# Patient Record
Sex: Male | Born: 1945
Health system: Southern US, Community
[De-identification: ages and names within clinical notes are randomized; demographics above are authoritative.]

## PROBLEM LIST (undated history)

## (undated) ENCOUNTER — Emergency Department (HOSPITAL_COMMUNITY): Discharge status: Absent without leave | Payer: Medicare PPO

## (undated) DIAGNOSIS — I10 Essential (primary) hypertension: Secondary | ICD-10-CM

## (undated) DIAGNOSIS — J449 Chronic obstructive pulmonary disease, unspecified: Secondary | ICD-10-CM

## (undated) DIAGNOSIS — E785 Hyperlipidemia, unspecified: Secondary | ICD-10-CM

## (undated) DIAGNOSIS — E559 Vitamin D deficiency, unspecified: Secondary | ICD-10-CM

## (undated) DIAGNOSIS — E119 Type 2 diabetes mellitus without complications: Secondary | ICD-10-CM

## (undated) HISTORY — DX: Hyperlipidemia, unspecified: E78.5

## (undated) HISTORY — DX: Essential (primary) hypertension: I10

## (undated) HISTORY — DX: Chronic obstructive pulmonary disease, unspecified: J44.9

## (undated) HISTORY — DX: Vitamin D deficiency, unspecified: E55.9

## (undated) HISTORY — DX: Type 2 diabetes mellitus without complications: E11.9

---

## 1997-09-10 ENCOUNTER — Ambulatory Visit (HOSPITAL_COMMUNITY): Admission: RE | Admit: 1997-09-10 | Discharge: 1997-09-10 | Payer: Self-pay | Admitting: Internal Medicine

## 1998-08-29 ENCOUNTER — Encounter: Payer: Self-pay | Admitting: Internal Medicine

## 1998-08-29 ENCOUNTER — Ambulatory Visit (HOSPITAL_COMMUNITY): Admission: RE | Admit: 1998-08-29 | Discharge: 1998-08-29 | Payer: Self-pay | Admitting: Internal Medicine

## 1999-09-05 ENCOUNTER — Ambulatory Visit (HOSPITAL_COMMUNITY): Admission: RE | Admit: 1999-09-05 | Discharge: 1999-09-05 | Payer: Self-pay | Admitting: Internal Medicine

## 1999-09-05 ENCOUNTER — Encounter: Payer: Self-pay | Admitting: Internal Medicine

## 2001-03-04 ENCOUNTER — Encounter: Payer: Self-pay | Admitting: Internal Medicine

## 2001-03-04 ENCOUNTER — Ambulatory Visit (HOSPITAL_COMMUNITY): Admission: RE | Admit: 2001-03-04 | Discharge: 2001-03-04 | Payer: Self-pay | Admitting: Internal Medicine

## 2002-03-31 ENCOUNTER — Ambulatory Visit (HOSPITAL_COMMUNITY): Admission: RE | Admit: 2002-03-31 | Discharge: 2002-03-31 | Payer: Self-pay | Admitting: Internal Medicine

## 2002-03-31 ENCOUNTER — Encounter: Payer: Self-pay | Admitting: Internal Medicine

## 2003-08-20 ENCOUNTER — Ambulatory Visit (HOSPITAL_COMMUNITY): Admission: RE | Admit: 2003-08-20 | Discharge: 2003-08-20 | Payer: Self-pay | Admitting: Internal Medicine

## 2005-05-07 ENCOUNTER — Ambulatory Visit (HOSPITAL_COMMUNITY): Admission: RE | Admit: 2005-05-07 | Discharge: 2005-05-07 | Payer: Self-pay | Admitting: Internal Medicine

## 2006-04-12 ENCOUNTER — Emergency Department (HOSPITAL_COMMUNITY): Admission: EM | Admit: 2006-04-12 | Discharge: 2006-04-12 | Payer: Self-pay | Admitting: Emergency Medicine

## 2007-07-03 ENCOUNTER — Ambulatory Visit (HOSPITAL_COMMUNITY): Admission: RE | Admit: 2007-07-03 | Discharge: 2007-07-03 | Payer: Self-pay | Admitting: Internal Medicine

## 2007-08-01 ENCOUNTER — Ambulatory Visit: Payer: Self-pay | Admitting: Gastroenterology

## 2007-08-14 ENCOUNTER — Ambulatory Visit: Payer: Self-pay | Admitting: Gastroenterology

## 2007-08-14 ENCOUNTER — Encounter: Payer: Self-pay | Admitting: Gastroenterology

## 2007-08-15 LAB — HM COLONOSCOPY

## 2007-08-19 ENCOUNTER — Encounter: Payer: Self-pay | Admitting: Gastroenterology

## 2009-12-12 ENCOUNTER — Ambulatory Visit (HOSPITAL_COMMUNITY): Admission: RE | Admit: 2009-12-12 | Discharge: 2009-12-12 | Payer: Self-pay | Admitting: Internal Medicine

## 2011-02-05 ENCOUNTER — Ambulatory Visit (HOSPITAL_COMMUNITY)
Admission: RE | Admit: 2011-02-05 | Discharge: 2011-02-05 | Disposition: A | Discharge status: Home / Self Care | Payer: BC Managed Care – PPO | Source: Ambulatory Visit | Attending: Internal Medicine | Admitting: Internal Medicine

## 2011-02-05 ENCOUNTER — Other Ambulatory Visit (HOSPITAL_COMMUNITY): Payer: Self-pay | Admitting: Internal Medicine

## 2011-02-05 DIAGNOSIS — I10 Essential (primary) hypertension: Secondary | ICD-10-CM

## 2011-02-05 DIAGNOSIS — Z0000 Encounter for general adult medical examination without abnormal findings: Secondary | ICD-10-CM | POA: Insufficient documentation

## 2011-09-12 ENCOUNTER — Other Ambulatory Visit (HOSPITAL_COMMUNITY): Payer: Self-pay | Admitting: Physician Assistant

## 2011-09-12 ENCOUNTER — Ambulatory Visit (HOSPITAL_COMMUNITY)
Admission: RE | Admit: 2011-09-12 | Discharge: 2011-09-12 | Disposition: A | Discharge status: Home / Self Care | Payer: Medicare Other | Source: Ambulatory Visit | Attending: Physician Assistant | Admitting: Physician Assistant

## 2011-09-12 DIAGNOSIS — S92919A Unspecified fracture of unspecified toe(s), initial encounter for closed fracture: Secondary | ICD-10-CM | POA: Insufficient documentation

## 2011-09-12 DIAGNOSIS — S91309A Unspecified open wound, unspecified foot, initial encounter: Secondary | ICD-10-CM

## 2011-09-12 DIAGNOSIS — W208XXA Other cause of strike by thrown, projected or falling object, initial encounter: Secondary | ICD-10-CM | POA: Insufficient documentation

## 2012-02-11 ENCOUNTER — Ambulatory Visit (HOSPITAL_COMMUNITY)
Admission: RE | Admit: 2012-02-11 | Discharge: 2012-02-11 | Disposition: A | Discharge status: Home / Self Care | Payer: Medicare Other | Source: Ambulatory Visit | Attending: Internal Medicine | Admitting: Internal Medicine

## 2012-02-11 ENCOUNTER — Other Ambulatory Visit (HOSPITAL_COMMUNITY): Payer: Self-pay | Admitting: Internal Medicine

## 2012-02-11 DIAGNOSIS — I10 Essential (primary) hypertension: Secondary | ICD-10-CM

## 2012-02-11 DIAGNOSIS — J984 Other disorders of lung: Secondary | ICD-10-CM | POA: Insufficient documentation

## 2012-05-05 ENCOUNTER — Ambulatory Visit: Payer: 59 | Admitting: Gastroenterology

## 2012-05-12 ENCOUNTER — Encounter: Payer: Self-pay | Admitting: Gastroenterology

## 2012-05-12 ENCOUNTER — Ambulatory Visit (INDEPENDENT_AMBULATORY_CARE_PROVIDER_SITE_OTHER): Payer: Medicare PPO | Admitting: Gastroenterology

## 2012-05-12 VITALS — BP 112/68 | HR 72 | Ht 68.75 in | Wt 152.5 lb

## 2012-05-12 DIAGNOSIS — R195 Other fecal abnormalities: Secondary | ICD-10-CM

## 2012-05-12 DIAGNOSIS — R7309 Other abnormal glucose: Secondary | ICD-10-CM | POA: Insufficient documentation

## 2012-05-12 DIAGNOSIS — E119 Type 2 diabetes mellitus without complications: Secondary | ICD-10-CM

## 2012-05-12 NOTE — Progress Notes (Signed)
History of Present Illness: This is a 67 year old white male referred at the request of [name], MD For evaluation of Hemoccult-positive stools. This was noted on routine testing. He has no GI complaints including change of bowel habits, abdominal pain, melena or hematochezia. He's on no gastric irritants including nonsteroidals. 2009 colonoscopy demonstrated a small hyperplastic polyp.     Past Medical History  Diagnosis Date  . DM (diabetes mellitus)   . HTN (hypertension)   . HLD (hyperlipidemia)    History reviewed. No pertinent past surgical history. family history includes Diabetes in his brother and sister and Heart attack in his brother and mother. Current Outpatient Prescriptions  Medication Sig Dispense Refill  . enalapril (VASOTEC) 10 MG tablet Take 10 mg by mouth daily.      . metFORMIN (GLUCOPHAGE) 500 MG tablet Take 500 mg by mouth 2 (two) times daily with a meal.       No current facility-administered medications for this visit.   Allergies as of 05/12/2012  . (No Known Allergies)    reports that he has been smoking Cigarettes.  He has a .3 pack-year smoking history. He has never used smokeless tobacco. He reports that  drinks alcohol. He reports that he does not use illicit drugs.     Review of Systems: Pertinent positive and negative review of systems were noted in the above HPI section. All other review of systems were otherwise negative.  Vital signs were reviewed in today's medical record Physical Exam: General: Well developed , well nourished, no acute distress Skin: anicteric Head: Normocephalic and atraumatic Eyes:  sclerae anicteric, EOMI Ears: Normal auditory acuity Mouth: No deformity or lesions Neck: Supple, no masses or thyromegaly Lungs: Clear throughout to auscultation Heart: Regular rate and rhythm; no murmurs, rubs or bruits Abdomen: Soft, non tender and non distended. No masses, hepatosplenomegaly or hernias noted. Normal Bowel  sounds Rectal:deferred Musculoskeletal: Symmetrical with no gross deformities  Skin: No lesions on visible extremities Pulses:  Normal pulses noted Extremities: No clubbing, cyanosis, edema or deformities noted Neurological: Alert oriented x 4, grossly nonfocal Cervical Nodes:  No significant cervical adenopathy Inguinal Nodes: No significant inguinal adenopathy Psychological:  Alert and cooperative. Normal mood and affect

## 2012-05-12 NOTE — Patient Instructions (Addendum)
You have been given a separate informational sheet regarding your tobacco use, the importance of quitting and local resources to help you quit. It has been recommended to you by Dr Arlyce Dice that you schedule a Colonoscopy Please call bask when you are ready to schedule

## 2012-05-12 NOTE — Assessment & Plan Note (Addendum)
Plan repeat colonoscopy. If negative I will obtain followup. Check recent CBC

## 2012-12-15 ENCOUNTER — Other Ambulatory Visit (HOSPITAL_COMMUNITY): Payer: Self-pay | Admitting: Physician Assistant

## 2012-12-15 ENCOUNTER — Ambulatory Visit (HOSPITAL_COMMUNITY)
Admission: RE | Admit: 2012-12-15 | Discharge: 2012-12-15 | Disposition: A | Discharge status: Home / Self Care | Payer: Medicare PPO | Source: Ambulatory Visit | Attending: Physician Assistant | Admitting: Physician Assistant

## 2012-12-15 DIAGNOSIS — M79609 Pain in unspecified limb: Secondary | ICD-10-CM | POA: Insufficient documentation

## 2012-12-15 DIAGNOSIS — M25579 Pain in unspecified ankle and joints of unspecified foot: Secondary | ICD-10-CM | POA: Insufficient documentation

## 2012-12-15 DIAGNOSIS — M7989 Other specified soft tissue disorders: Secondary | ICD-10-CM | POA: Insufficient documentation

## 2012-12-15 DIAGNOSIS — S93409A Sprain of unspecified ligament of unspecified ankle, initial encounter: Secondary | ICD-10-CM

## 2013-02-11 ENCOUNTER — Encounter: Payer: Self-pay | Admitting: Internal Medicine

## 2013-02-11 DIAGNOSIS — E782 Mixed hyperlipidemia: Secondary | ICD-10-CM | POA: Insufficient documentation

## 2013-02-11 DIAGNOSIS — I10 Essential (primary) hypertension: Secondary | ICD-10-CM | POA: Insufficient documentation

## 2013-02-11 DIAGNOSIS — E559 Vitamin D deficiency, unspecified: Secondary | ICD-10-CM | POA: Insufficient documentation

## 2013-02-11 NOTE — Progress Notes (Signed)
Patient ID: Jesus Bush, male   DOB: [birth year], 67 y.o.   MRN: [id number]   This very nice 67 yo MWM presents for comprehensive annual screening examination follow up with hypertension, hyperlipidemia, diabetes and vitamin D deficiency.    BP has been controlled at home. Today's BP is  116/70. Patient denies any cardiac type chest pain, palpitations, dyspnea/orthopnea/PND, dizziness, claudication, or dependent edema.   Hyperlipidemia is controlled with diet & meds. Last cholesterol was 161, Triglycerides were  277, HDL 31, and LDL 75 at goal . Patient denies myalgias or other med SE's.    Also, the patient's diabetes has been controlled with diet and medications. Last A1c was 5.8%.  FBG's usually range betw. 100-120 mg%.Patient denies any symptoms of reactive hypoglycemia, diabetic polys, paresthesias or visual blurring.   Further, Patient has history of vitamin D deficiency with last vitamin D of 45 . Patient supplements vitamin without any suspected side-effects.  Current Outpatient Prescriptions on File Prior to Visit  Medication Sig Dispense Refill  . aspirin 81 MG tablet Take 81 mg by mouth daily.      Marland Kitchen atorvastatin (LIPITOR) 40 MG tablet Take 40 mg by mouth daily.      . enalapril (VASOTEC) 10 MG tablet Take 10 mg by mouth daily.      . magnesium gluconate (MAGONATE) 500 MG tablet Take 500 mg by mouth 2 (two) times daily.      . metFORMIN (GLUMETZA) 500 MG (MOD) 24 hr tablet Take 500 mg by mouth daily with breakfast.      . Vitamin D, Ergocalciferol, (DRISDOL) 50000 UNITS CAPS capsule Take 50,000 Units by mouth daily.         Allergies  Allergen Reactions  . Ppd [Tuberculin Purified Protein Derivative]     Positive PPD.    PMHx:   Past Medical History  Diagnosis Date  . Vitamin D deficiency   . Diabetes mellitus without complication   . Hypertension   . Hyperlipidemia     FHx:    Reviewed / unchanged  SHx:    Reviewed / unchanged  Systems Review: Constitutional:  Denies fever, chills, wt changes, headaches, insomnia, fatigue, night sweats, change in appetite. Eyes: Denies redness, blurred vision, diplopia, discharge, itchy, watery eyes.  ENT: Denies discharge, congestion, post nasal drip, epistaxis, sore throat, earache, hearing loss, dental pain, tinnitus, vertigo, sinus pain, snoring.  CV: Denies chest pain, palpitations, irregular heartbeat, syncope, dyspnea, diaphoresis, orthopnea, PND, claudication, edema. Respiratory: denies cough, dyspnea, DOE, pleurisy, hoarseness, laryngitis, wheezing.  Gastrointestinal: Denies dysphagia, odynophagia, heartburn, reflux, water brash, abdominal pain or cramps, nausea, vomiting, bloating, diarrhea, constipation, hematemesis, melena, hematochezia,  Hemorrhoids. Genitourinary: Denies dysuria, frequency, urgency, nocturia, hesitancy, discharge, hematuria, flank pain. Musculoskeletal: Denies arthralgias, myalgias, stiffness, jt. swelling, pain, limp, strain/sprain.  Skin: Denies pruritus, rash, hives, warts, acne, eczema, change in skin lesion(s). Neuro: No weakness, tremor, incoordination, spasms, paresthesia, or pain. Psychiatric: Denies confusion, memory loss, or sensory loss. Endo: Denies change in weight, skin, hair change.  Heme/Lymph: No excessive bleeding, bruising, orenlarged lymph nodes.  BP 116/70   P 80   R 16   Ht 5'10"   Wt 149 #  Estimated body mass index is 22.69 kg/(m^2) as calculated from the following:   Height as of 05/12/12: 5' 8.75" (1.746 m).   Weight as of 05/12/12: 152 lb 8 oz (69.174 kg).  On Exam: Appears well nourished - in no distress. Eyes: PERRLA, EOMs, conjunctiva no swelling or erythema. Sinuses: No frontal/maxillary  tenderness ENT/Mouth: EAC's clear, TM's nl w/o erythema, bulging. Nares clear w/o erythema, swelling, exudates. Oropharynx clear without erythema or exudates. Oral hygiene is good. Tongue normal, non obstructing. Hearing intact.  Neck: Supple. Thyroid nl. Car 2+/2+  without bruits, nodes or JVD. Chest: Respirations nl with BS clear & equal w/o rales, rhonchi, wheezing or stridor.  Cor: Heart sounds normal w/ regular rate and rhythm without sig. murmurs, gallops, clicks, or rubs. Peripheral pulses normal and equal  without edema.  Abdomen: Soft & bowel sounds normal. Non-tender w/o guarding, rebound, hernias, masses, or organomegaly.  Lymphatics: Unremarkable.  Musculoskeletal: Full ROM all peripheral extremities, joint stability, 5/5 strength, and normal gait.  Skin: Warm, dry without exposed rashes, lesions, ecchymosis apparent.  Neuro: Cranial nerves intact, reflexes equal bilaterally. Sensory-motor testing grossly intact. Tendon reflexes grossly intact.  Pysch: Alert & oriented x 3. Insight and judgement nl & appropriate. No ideations.  Assessment and Plan:  1. Hypertension - Continue monitor blood pressure at home. Continue diet/meds same.  2. Hyperlipidemia - Continue diet/meds, exercise,& lifestyle modifications. Continue monitor periodic cholesterol/liver & renal functions   3. Diabetes - continue recommend prudent low glycemic diet, weight control, regular exercise, diabetic monitoring and periodic eye exams- referred to Dr Emily Filbert  4. Vitamin D Deficiency - Continue supplementation.  Further disposition pending results of labs.

## 2013-02-12 ENCOUNTER — Ambulatory Visit: Payer: Commercial Managed Care - HMO | Admitting: Internal Medicine

## 2013-02-12 ENCOUNTER — Encounter: Payer: Self-pay | Admitting: Internal Medicine

## 2013-02-12 VITALS — BP 116/70 | HR 80 | Temp 97.9°F | Resp 16 | Ht 70.0 in | Wt 149.6 lb

## 2013-02-12 DIAGNOSIS — I10 Essential (primary) hypertension: Secondary | ICD-10-CM

## 2013-02-12 DIAGNOSIS — E559 Vitamin D deficiency, unspecified: Secondary | ICD-10-CM

## 2013-02-12 DIAGNOSIS — Z0000 Encounter for general adult medical examination without abnormal findings: Secondary | ICD-10-CM

## 2013-02-12 DIAGNOSIS — E782 Mixed hyperlipidemia: Secondary | ICD-10-CM

## 2013-02-12 DIAGNOSIS — E119 Type 2 diabetes mellitus without complications: Secondary | ICD-10-CM

## 2013-02-12 DIAGNOSIS — Z1212 Encounter for screening for malignant neoplasm of rectum: Secondary | ICD-10-CM

## 2013-02-12 DIAGNOSIS — Z79899 Other long term (current) drug therapy: Secondary | ICD-10-CM

## 2013-02-12 LAB — HEMOGLOBIN A1C
Hgb A1c MFr Bld: 5.9 % — ABNORMAL HIGH (ref <5.7)
Mean Plasma Glucose: 123 mg/dL — ABNORMAL HIGH (ref <117)

## 2013-02-12 LAB — CBC WITH DIFFERENTIAL/PLATELET
Basophils Absolute: 0 10*3/uL (ref 0.0–0.1)
Basophils Relative: 1 % (ref 0–1)
Hemoglobin: 15.2 g/dL (ref 13.0–17.0)
Lymphocytes Relative: 27 % (ref 12–46)
Lymphs Abs: 2.2 10*3/uL (ref 0.7–4.0)
MCHC: 34.2 g/dL (ref 30.0–36.0)
MCV: 94.1 fL (ref 78.0–100.0)
Monocytes Absolute: 0.5 10*3/uL (ref 0.1–1.0)
Monocytes Relative: 7 % (ref 3–12)
Platelets: 287 10*3/uL (ref 150–400)
RDW: 13.2 % (ref 11.5–15.5)
WBC: 8 10*3/uL (ref 4.0–10.5)

## 2013-02-12 LAB — LIPID PANEL: HDL: 38 mg/dL — ABNORMAL LOW (ref >39)

## 2013-02-12 LAB — BASIC METABOLIC PANEL WITH GFR
Potassium: 4.2 mEq/L (ref 3.5–5.3)
Sodium: 139 mEq/L (ref 135–145)

## 2013-02-12 LAB — HEPATIC FUNCTION PANEL
Alkaline Phosphatase: 90 U/L (ref 39–117)
Total Protein: 7.2 g/dL (ref 6.0–8.3)

## 2013-02-13 ENCOUNTER — Ambulatory Visit (HOSPITAL_COMMUNITY)
Admission: RE | Admit: 2013-02-13 | Discharge: 2013-02-13 | Disposition: A | Discharge status: Home / Self Care | Payer: Medicare PPO | Source: Ambulatory Visit | Attending: Internal Medicine | Admitting: Internal Medicine

## 2013-02-13 DIAGNOSIS — I10 Essential (primary) hypertension: Secondary | ICD-10-CM | POA: Insufficient documentation

## 2013-02-13 DIAGNOSIS — I700 Atherosclerosis of aorta: Secondary | ICD-10-CM | POA: Insufficient documentation

## 2013-02-13 DIAGNOSIS — J984 Other disorders of lung: Secondary | ICD-10-CM | POA: Insufficient documentation

## 2013-02-13 DIAGNOSIS — F17200 Nicotine dependence, unspecified, uncomplicated: Secondary | ICD-10-CM | POA: Insufficient documentation

## 2013-02-13 LAB — TSH: TSH: 0.367 u[IU]/mL (ref 0.350–4.500)

## 2013-02-13 LAB — INSULIN, FASTING: Insulin fasting, serum: 7 u[IU]/mL (ref 3–28)

## 2013-02-13 LAB — VITAMIN D 25 HYDROXY (VIT D DEFICIENCY, FRACTURES): Vit D, 25-Hydroxy: 74 ng/mL (ref 30–89)

## 2013-02-13 NOTE — Addendum Note (Signed)
Addended by: [name] on: 02/13/2013 02:34 PM   Modules accepted: Orders

## 2013-02-16 ENCOUNTER — Telehealth: Payer: Self-pay

## 2013-02-16 NOTE — Telephone Encounter (Signed)
Message copied by Joya Martyr on Mon Feb 16, 2013  8:55 AM ------      Message from: [name]      Created: Fri Feb 13, 2013  3:32 PM       Call CXR shows COPD no cancer yet and important to stop smoking ------

## 2013-04-17 ENCOUNTER — Ambulatory Visit (INDEPENDENT_AMBULATORY_CARE_PROVIDER_SITE_OTHER): Payer: Commercial Managed Care - HMO | Admitting: *Deleted

## 2013-04-17 DIAGNOSIS — Z23 Encounter for immunization: Secondary | ICD-10-CM

## 2013-07-24 ENCOUNTER — Other Ambulatory Visit: Payer: Self-pay | Admitting: Internal Medicine

## 2013-08-19 ENCOUNTER — Other Ambulatory Visit: Payer: Self-pay | Admitting: Internal Medicine

## 2013-08-30 ENCOUNTER — Emergency Department (HOSPITAL_COMMUNITY): Payer: Medicare PPO

## 2013-08-30 ENCOUNTER — Emergency Department (HOSPITAL_COMMUNITY)
Admission: EM | Admit: 2013-08-30 | Discharge: 2013-08-30 | Disposition: A | Discharge status: Home / Self Care | Payer: Medicare PPO | Attending: Emergency Medicine | Admitting: Emergency Medicine

## 2013-08-30 ENCOUNTER — Encounter (HOSPITAL_COMMUNITY): Payer: Self-pay | Admitting: Emergency Medicine

## 2013-08-30 DIAGNOSIS — E785 Hyperlipidemia, unspecified: Secondary | ICD-10-CM | POA: Insufficient documentation

## 2013-08-30 DIAGNOSIS — S81011A Laceration without foreign body, right knee, initial encounter: Secondary | ICD-10-CM

## 2013-08-30 DIAGNOSIS — E559 Vitamin D deficiency, unspecified: Secondary | ICD-10-CM | POA: Insufficient documentation

## 2013-08-30 DIAGNOSIS — S81809A Unspecified open wound, unspecified lower leg, initial encounter: Principal | ICD-10-CM

## 2013-08-30 DIAGNOSIS — Z79899 Other long term (current) drug therapy: Secondary | ICD-10-CM | POA: Insufficient documentation

## 2013-08-30 DIAGNOSIS — S91009A Unspecified open wound, unspecified ankle, initial encounter: Principal | ICD-10-CM

## 2013-08-30 DIAGNOSIS — E119 Type 2 diabetes mellitus without complications: Secondary | ICD-10-CM | POA: Insufficient documentation

## 2013-08-30 DIAGNOSIS — I10 Essential (primary) hypertension: Secondary | ICD-10-CM | POA: Insufficient documentation

## 2013-08-30 DIAGNOSIS — Y929 Unspecified place or not applicable: Secondary | ICD-10-CM | POA: Insufficient documentation

## 2013-08-30 DIAGNOSIS — Z7982 Long term (current) use of aspirin: Secondary | ICD-10-CM | POA: Insufficient documentation

## 2013-08-30 DIAGNOSIS — S81009A Unspecified open wound, unspecified knee, initial encounter: Secondary | ICD-10-CM | POA: Insufficient documentation

## 2013-08-30 DIAGNOSIS — W292XXA Contact with other powered household machinery, initial encounter: Secondary | ICD-10-CM | POA: Insufficient documentation

## 2013-08-30 DIAGNOSIS — Y9389 Activity, other specified: Secondary | ICD-10-CM | POA: Insufficient documentation

## 2013-08-30 DIAGNOSIS — F17200 Nicotine dependence, unspecified, uncomplicated: Secondary | ICD-10-CM | POA: Insufficient documentation

## 2013-08-30 MED ORDER — OXYCODONE-ACETAMINOPHEN 5-325 MG PO TABS
1.0000 | ORAL_TABLET | Freq: Once | ORAL | Status: AC
  Administered 2013-08-30: 1 via ORAL
  Filled 2013-08-30: qty 1

## 2013-08-30 MED ORDER — HYDROCODONE-ACETAMINOPHEN 5-325 MG PO TABS: 1.0000 | ORAL_TABLET | Freq: Four times a day (QID) | ORAL | Status: DC | PRN

## 2013-08-30 MED ORDER — CEPHALEXIN 500 MG PO CAPS: 500.0000 mg | ORAL_CAPSULE | Freq: Four times a day (QID) | ORAL | Status: DC

## 2013-08-30 NOTE — ED Notes (Signed)
Reports injuring left leg while using a grinder. Has 5cm laceration to anterior lower leg, bleeding controlled.

## 2013-08-30 NOTE — Discharge Instructions (Signed)
Keflex to prevent infection. norco for pain. Keep clean. Apply topical antibiotic ointment twice a day. Watch for signs if infection. If any issues return to ED, otherwise follow up in 10 days for suture removal.    Laceration Care, Adult A laceration is a cut or lesion that goes through all layers of the skin and into the tissue just beneath the skin. TREATMENT  Some lacerations may not require closure. Some lacerations may not be able to be closed due to an increased risk of infection. It is important to see your caregiver as soon as possible after an injury to minimize the risk of infection and maximize the opportunity for successful closure. If closure is appropriate, pain medicines may be given, if needed. The wound will be cleaned to help prevent infection. Your caregiver will use stitches (sutures), staples, wound glue (adhesive), or skin adhesive strips to repair the laceration. These tools bring the skin edges together to allow for faster healing and a better cosmetic outcome. However, all wounds will heal with a scar. Once the wound has healed, scarring can be minimized by covering the wound with sunscreen during the day for 1 full year. HOME CARE INSTRUCTIONS  For sutures or staples:  Keep the wound clean and dry.  If you were given a bandage (dressing), you should change it at least once a day. Also, change the dressing if it becomes wet or dirty, or as directed by your caregiver.  Wash the wound with soap and water 2 times a day. Rinse the wound off with water to remove all soap. Pat the wound dry with a clean towel.  After cleaning, apply a thin layer of the antibiotic ointment as recommended by your caregiver. This will help prevent infection and keep the dressing from sticking.  You may shower as usual after the first 24 hours. Do not soak the wound in water until the sutures are removed.  Only take over-the-counter or prescription medicines for pain, discomfort, or fever as  directed by your caregiver.  Get your sutures or staples removed as directed by your caregiver. For skin adhesive strips:  Keep the wound clean and dry.  Do not get the skin adhesive strips wet. You may bathe carefully, using caution to keep the wound dry.  If the wound gets wet, pat it dry with a clean towel.  Skin adhesive strips will fall off on their own. You may trim the strips as the wound heals. Do not remove skin adhesive strips that are still stuck to the wound. They will fall off in time. For wound adhesive:  You may briefly wet your wound in the shower or bath. Do not soak or scrub the wound. Do not swim. Avoid periods of heavy perspiration until the skin adhesive has fallen off on its own. After showering or bathing, gently pat the wound dry with a clean towel.  Do not apply liquid medicine, cream medicine, or ointment medicine to your wound while the skin adhesive is in place. This may loosen the film before your wound is healed.  If a dressing is placed over the wound, be careful not to apply tape directly over the skin adhesive. This may cause the adhesive to be pulled off before the wound is healed.  Avoid prolonged exposure to sunlight or tanning lamps while the skin adhesive is in place. Exposure to ultraviolet light in the first year will darken the scar.  The skin adhesive will usually remain in place for 5 to 10  days, then naturally fall off the skin. Do not pick at the adhesive film. You may need a tetanus shot if:  You cannot remember when you had your last tetanus shot.  You have never had a tetanus shot. If you get a tetanus shot, your arm may swell, get red, and feel warm to the touch. This is common and not a problem. If you need a tetanus shot and you choose not to have one, there is a rare chance of getting tetanus. Sickness from tetanus can be serious. SEEK MEDICAL CARE IF:   You have redness, swelling, or increasing pain in the wound.  You see a red  line that goes away from the wound.  You have yellowish-white fluid (pus) coming from the wound.  You have a fever.  You notice a bad smell coming from the wound or dressing.  Your wound breaks open before or after sutures have been removed.  You notice something coming out of the wound such as wood or glass.  Your wound is on your hand or foot and you cannot move a finger or toe. SEEK IMMEDIATE MEDICAL CARE IF:   Your pain is not controlled with prescribed medicine.  You have severe swelling around the wound causing pain and numbness or a change in color in your arm, hand, leg, or foot.  Your wound splits open and starts bleeding.  You have worsening numbness, weakness, or loss of function of any joint around or beyond the wound.  You develop painful lumps near the wound or on the skin anywhere on your body. MAKE SURE YOU:   Understand these instructions.  Will watch your condition.  Will get help right away if you are not doing well or get worse. Document Released: 03/12/2005 Document Revised: 06/04/2011 Document Reviewed: 09/05/2010 [facility] Patient Information 2014 [place], [place].

## 2013-08-30 NOTE — ED Notes (Signed)
PA at the bedside performing suture care. Pt tolerating without difficulty.

## 2013-08-30 NOTE — ED Provider Notes (Signed)
CSN: [id number]     Arrival date & time 08/30/13  1239 History   First MD Initiated Contact with Patient 08/30/13 1244     Chief Complaint  Patient presents with  . Laceration     (Consider location/radiation/quality/duration/timing/severity/associated sxs/prior Treatment) HPI Jesus Bush is a 68 y.o. male who presents to ED with laceration to the right knee. States was using a grinder and accidentally cut himself. Denies numbness or weakness to the leg. Denies difficulty moving the leg. Tetanus within 2 years. No other injuries.   Past Medical History  Diagnosis Date  . Vitamin D deficiency   . Diabetes mellitus without complication   . Hypertension   . Hyperlipidemia    History reviewed. No pertinent past surgical history. Family History  Problem Relation Age of Onset  . Heart attack Mother   . Heart disease Mother   . Heart attack Brother   . Hypertension Brother   . Diabetes Sister   . Heart disease Sister   . Heart disease Father   . Heart attack Father    History  Substance Use Topics  . Smoking status: Current Every Day Smoker -- 0.03 packs/day for 10 years    Types: Cigarettes  . Smokeless tobacco: Never Used  . Alcohol Use: Yes     Comment: liquor- once a week    Review of Systems  Constitutional: Negative for fever and chills.  Respiratory: Negative for cough, chest tightness and shortness of breath.   Cardiovascular: Negative for chest pain, palpitations and leg swelling.  Genitourinary: Negative for dysuria, urgency, frequency and hematuria.  Musculoskeletal: Negative for arthralgias and myalgias.  Skin: Positive for wound. Negative for rash.  Allergic/Immunologic: Negative for immunocompromised state.  Neurological: Negative for dizziness, weakness, light-headedness, numbness and headaches.      Allergies  Ppd  Home Medications   Prior to Admission medications   Medication Sig Start Date End Date Taking? Authorizing Provider  aspirin 81 MG  tablet Take 81 mg by mouth daily.    Historical Provider, MD  atorvastatin (LIPITOR) 40 MG tablet TAKE 1 TABLET EVERY DAY FOR CHOLESTEROL 07/24/13   Vicie Mutters, PA-C  enalapril (VASOTEC) 10 MG tablet Take 10 mg by mouth daily.    Historical Provider, MD  enalapril (VASOTEC) 20 MG tablet TAKE 1 TABLET EVERY DAY FOR BLOOD PRESSURE 08/19/13   Melissa R Smith, PA-C  magnesium gluconate (MAGONATE) 500 MG tablet Take 500 mg by mouth 2 (two) times daily.    Historical Provider, MD  metFORMIN (GLUMETZA) 500 MG (MOD) 24 hr tablet Take 500 mg by mouth daily with breakfast.    Historical Provider, MD  Vitamin D, Ergocalciferol, (DRISDOL) 50000 UNITS CAPS capsule Take 50,000 Units by mouth daily. Takes every other day    Historical Provider, MD   BP 119/60  Pulse 84  Temp(Src) 98 F (36.7 C) (Oral)  Resp 18  Ht 5\' 10"  (1.778 m)  Wt 155 lb (70.308 kg)  BMI 22.24 kg/m2  SpO2 99% Physical Exam  Nursing note and vitals reviewed. Constitutional: He appears well-developed and well-nourished. No distress.  HENT:  Head: Normocephalic and atraumatic.  Eyes: Conjunctivae are normal.  Neck: Neck supple.  Cardiovascular: Normal rate, regular rhythm and normal heart sounds.   Pulmonary/Chest: Effort normal. No respiratory distress. He has no wheezes. He has no rales.  Musculoskeletal: He exhibits no edema.  Full ROM of the knee and ankle. 5/5 strength with knee extension and flexion against resistance. 5/5 and equal strength  with ankle dorsi and plantarflexion. Dorsal pedal pulses intact. Sensation intact over anterior and posterior shin and foot.   Neurological: He is alert.  Skin: Skin is warm and dry.  6cm laceration to the right anterior shin just distal to the knee. Gaping. Hemostatic.     ED Course  Procedures (including critical care time) Labs Review Labs Reviewed - No data to display  Imaging Review No results found.   EKG Interpretation None      LACERATION REPAIR Performed by:  Gentri Guardado A Kinnie Kaupp Authorized by: Florene Route Brecklyn Galvis Consent: Verbal consent obtained. Risks and benefits: risks, benefits and alternatives were discussed Consent given by: patient Patient identity confirmed: provided demographic data Prepped and Draped in normal sterile fashion Wound explored  Laceration Location: right shin  Laceration Length: 6cm  No Foreign Bodies seen or palpated  Anesthesia: local infiltration  Local anesthetic: lidocaine 2% w epinephrine  Anesthetic total: 3 ml  Irrigation method: syringe Amount of cleaning: standard  Skin closure: subcutaneous with vicril rapid 4.0, skin prolene 4.0  Number of sutures: 2 subq, 8 skin  Technique: simple interrupted  Patient tolerance: Patient tolerated the procedure well with no immediate complications.  MDM   Final diagnoses:  Laceration of right knee   Deep laceration to the right proximal shin, right under the knee. X-ray obtained to rule out foreign body and bony involvement and is negative. Wound does appear somewhat 30, irrigated with 1 L of saline. There is no evidence of any nerve, vascular, tendon involvement. Patient has full range of motion of both knee and ankle joints with no strength deficit. Wound repaired. Patient is diabetic, and a dirty wound, will place on Keflex. Home with Norco for pain and followup as needed or in 10 days for suture removal  Filed Vitals:   08/30/13 1247  BP: 119/60  Pulse: 84  Temp: 98 F (36.7 C)  TempSrc: Oral  Resp: 18  Height: 5\' 10"  (1.778 m)  Weight: 155 lb (70.308 kg)  SpO2: 99%     [name] A [name], PA-C 08/30/13 1554

## 2013-08-30 NOTE — ED Notes (Signed)
Dressing and antibiotic ointment applied to R lower leg laceration. Wound care instructions reviewed with patient. Has no further questions at the time. Vital signs stable.

## 2013-08-31 NOTE — ED Provider Notes (Signed)
68 y.o. Male with injury from grinder to right knee just prior to arrival.  No other injuries.  tdap iutd.  Patient with history of diabetes.   I performed a history and physical examination of Jesus Bush and discussed his management with Ms. Marc Morgans.  I agree with the history, physical, assessment, and plan of care, with the following exceptions: None  I was present for the following procedures: None Time Spent in Critical Care of the patient: None Time spent in discussions with the patient and family: Josephine, MD 08/31/13 [phone]

## 2013-09-07 ENCOUNTER — Ambulatory Visit (INDEPENDENT_AMBULATORY_CARE_PROVIDER_SITE_OTHER): Payer: Commercial Managed Care - HMO | Admitting: Physician Assistant

## 2013-09-07 ENCOUNTER — Encounter: Payer: Self-pay | Admitting: Physician Assistant

## 2013-09-07 VITALS — BP 102/60 | HR 84 | Temp 98.6°F | Resp 16 | Wt 146.0 lb

## 2013-09-07 DIAGNOSIS — R05 Cough: Secondary | ICD-10-CM

## 2013-09-07 DIAGNOSIS — Z789 Other specified health status: Secondary | ICD-10-CM

## 2013-09-07 DIAGNOSIS — Z1331 Encounter for screening for depression: Secondary | ICD-10-CM

## 2013-09-07 DIAGNOSIS — F17200 Nicotine dependence, unspecified, uncomplicated: Secondary | ICD-10-CM

## 2013-09-07 DIAGNOSIS — Z4802 Encounter for removal of sutures: Secondary | ICD-10-CM

## 2013-09-07 DIAGNOSIS — R059 Cough, unspecified: Secondary | ICD-10-CM

## 2013-09-07 DIAGNOSIS — Z0000 Encounter for general adult medical examination without abnormal findings: Secondary | ICD-10-CM

## 2013-09-07 MED ORDER — PROMETHAZINE-CODEINE 6.25-10 MG/5ML PO SYRP: 5.0000 mL | ORAL_SOLUTION | Freq: Four times a day (QID) | ORAL | Status: DC | PRN

## 2013-09-07 MED ORDER — AZITHROMYCIN 250 MG PO TABS: ORAL_TABLET | ORAL | Status: AC

## 2013-09-07 NOTE — Patient Instructions (Signed)
Suture Removal, Care After Refer to this sheet in the next few weeks. These instructions provide you with information on caring for yourself after your procedure. Your health care provider may also give you more specific instructions. Your treatment has been planned according to current medical practices, but problems sometimes occur. Call your health care provider if you have any problems or questions after your procedure. WHAT TO EXPECT AFTER THE PROCEDURE After your stitches (sutures) are removed, it is typical to have the following:  Some discomfort and swelling in the wound area.  Slight redness in the area. HOME CARE INSTRUCTIONS   If you have skin adhesive strips over the wound area, do not take the strips off. They will fall off on their own in a few days. If the strips remain in place after 14 days, you may remove them.  Change any bandages (dressings) at least once a day or as directed by your health care provider. If the bandage sticks, soak it off with warm, soapy water.  Apply cream or ointment only as directed by your health care provider. If using cream or ointment, wash the area with soap and water 2 times a day to remove all the cream or ointment. Rinse off the soap and pat the area dry with a clean towel.  Keep the wound area dry and clean. If the bandage becomes wet or dirty, or if it develops a bad smell, change it as soon as possible.  Continue to protect the wound from injury.  Use sunscreen when out in the sun. New scars become sunburned easily. SEEK MEDICAL CARE IF:  You have increasing redness, swelling, or pain in the wound.  You see pus coming from the wound.  You have a fever.  You notice a bad smell coming from the wound or dressing.  Your wound breaks open (edges not staying together). Document Released: 12/05/2000 Document Revised: 12/31/2012 Document Reviewed: 10/22/2012 [facility] Patient Information 2014 [place].   Preventative Care for  Adults, Male       REGULAR HEALTH EXAMS:  A routine yearly physical is a good way to check in with your primary care provider about your health and preventive screening. It is also an opportunity to share updates about your health and any concerns you have, and receive a thorough all-over exam.   Most health insurance companies pay for at least some preventative services.  Check with your health plan for specific coverages.  WHAT PREVENTATIVE SERVICES DO MEN NEED?  Adult men should have their weight and blood pressure checked regularly.   Men age 43 and older should have their cholesterol levels checked regularly.  Beginning at age 37 and continuing to age 29, men should be screened for colorectal cancer.  Certain people should may need continued testing until age 42.  Other cancer screening may include exams for testicular and prostate cancer.  Updating vaccinations is part of preventative care.  Vaccinations help protect against diseases such as the flu.  Lab tests are generally done as part of preventative care to screen for anemia and blood disorders, to screen for problems with the kidneys and liver, to screen for bladder problems, to check blood sugar, and to check your cholesterol level.  Preventative services generally include counseling about diet, exercise, avoiding tobacco, drugs, excessive alcohol consumption, and sexually transmitted infections.    GENERAL RECOMMENDATIONS FOR GOOD HEALTH:  Healthy diet:  Eat a variety of foods, including fruit, vegetables, animal or vegetable protein, such as meat, fish,  chicken, and eggs, or beans, lentils, tofu, and grains, such as rice.  Drink plenty of water daily.  Decrease saturated fat in the diet, avoid lots of red meat, processed foods, sweets, fast foods, and fried foods.  Exercise:  Aerobic exercise helps maintain good heart health. At least 30-40 minutes of moderate-intensity exercise is recommended. For example, a brisk  walk that increases your heart rate and breathing. This should be done on most days of the week.   Find a type of exercise or a variety of exercises that you enjoy so that it becomes a part of your daily life.  Examples are running, walking, swimming, water aerobics, and biking.  For motivation and support, explore group exercise such as aerobic class, spin class, Zumba, Yoga,or  martial arts, etc.    Set exercise goals for yourself, such as a certain weight goal, walk or run in a race such as a 5k walk/run.  Speak to your primary care provider about exercise goals.  Disease prevention:  If you smoke or chew tobacco, find out from your caregiver how to quit. It can literally save your life, no matter how long you have been a tobacco user. If you do not use tobacco, never begin.   Maintain a healthy diet and normal weight. Increased weight leads to problems with blood pressure and diabetes.   The Body Mass Index or BMI is a way of measuring how much of your body is fat. Having a BMI above 27 increases the risk of heart disease, diabetes, hypertension, stroke and other problems related to obesity. Your caregiver can help determine your BMI and based on it develop an exercise and dietary program to help you achieve or maintain this important measurement at a healthful level.  High blood pressure causes heart and blood vessel problems.  Persistent high blood pressure should be treated with medicine if weight loss and exercise do not work.   Fat and cholesterol leaves deposits in your arteries that can block them. This causes heart disease and vessel disease elsewhere in your body.  If your cholesterol is found to be high, or if you have heart disease or certain other medical conditions, then you may need to have your cholesterol monitored frequently and be treated with medication.   Ask if you should have a stress test if your history suggests this. A stress test is a test done on a treadmill that  looks for heart disease. This test can find disease prior to there being a problem.  Avoid drinking alcohol in excess (more than two drinks per day).  Avoid use of street drugs. Do not share needles with anyone. Ask for professional help if you need assistance or instructions on stopping the use of alcohol, cigarettes, and/or drugs.  Brush your teeth twice a day with fluoride toothpaste, and floss once a day. Good oral hygiene prevents tooth decay and gum disease. The problems can be painful, unattractive, and can cause other health problems. Visit your dentist for a routine oral and dental check up and preventive care every 6-12 months.   Look at your skin regularly.  Use a mirror to look at your back. Notify your caregivers of changes in moles, especially if there are changes in shapes, colors, a size larger than a pencil eraser, an irregular border, or development of new moles.  Safety:  Use seatbelts 100% of the time, whether driving or as a passenger.  Use safety devices such as hearing protection if you work in  environments with loud noise or significant background noise.  Use safety glasses when doing any work that could send debris in to the eyes.  Use a helmet if you ride a bike or motorcycle.  Use appropriate safety gear for contact sports.  Talk to your caregiver about gun safety.  Use sunscreen with a SPF (or skin protection factor) of 15 or greater.  Lighter skinned people are at a greater risk of skin cancer. Don't forget to also wear sunglasses in order to protect your eyes from too much damaging sunlight. Damaging sunlight can accelerate cataract formation.   Practice safe sex. Use condoms. Condoms are used for birth control and to help reduce the spread of sexually transmitted infections (or STIs).  Some of the STIs are gonorrhea (the clap), chlamydia, syphilis, trichomonas, herpes, HPV (human papilloma virus) and HIV (human immunodeficiency virus) which causes AIDS. The herpes, HIV  and HPV are viral illnesses that have no cure. These can result in disability, cancer and death.   Keep carbon monoxide and smoke detectors in your home functioning at all times. Change the batteries every 6 months or use a model that plugs into the wall.   Vaccinations:  Stay up to date with your tetanus shots and other required immunizations. You should have a booster for tetanus every 10 years. Be sure to get your flu shot every year, since 5%-20% of the [place] population comes down with the flu. The flu vaccine changes each year, so being vaccinated once is not enough. Get your shot in the fall, before the flu season peaks.   Other vaccines to consider:  Pneumococcal vaccine to protect against certain types of pneumonia.  This is normally recommended for adults age 29 or older.  However, adults younger than 69 years old with certain underlying conditions such as diabetes, heart or lung disease should also receive the vaccine.  Shingles vaccine to protect against Varicella Zoster if you are older than age 43, or younger than 69 years old with certain underlying illness.  Hepatitis A vaccine to protect against a form of infection of the liver by a virus acquired from food.  Hepatitis B vaccine to protect against a form of infection of the liver by a virus acquired from blood or body fluids, particularly if you work in health care.  If you plan to travel internationally, check with your local health department for specific vaccination recommendations.  Cancer Screening:  Most routine colon cancer screening begins at the age of 35. On a yearly basis, doctors may provide special easy to use take-home tests to check for hidden blood in the stool. Sigmoidoscopy or colonoscopy can detect the earliest forms of colon cancer and is life saving. These tests use a small camera at the end of a tube to directly examine the colon. Speak to your caregiver about this at age 61, when routine screening begins  (and is repeated every 5 years unless early forms of pre-cancerous polyps or small growths are found).   At the age of 41 men usually start screening for prostate cancer every year. Screening may begin at a younger age for those with higher risk. Those at higher risk include African-Americans or having a family history of prostate cancer. There are two types of tests for prostate cancer:   Prostate-specific antigen (PSA) testing. Recent studies raise questions about prostate cancer using PSA and you should discuss this with your caregiver.   Digital rectal exam (in which your doctor's lubricated and gloved finger  feels for enlargement of the prostate through the anus).   Screening for testicular cancer.  Do a monthly exam of your testicles. Gently roll each testicle between your thumb and fingers, feeling for any abnormal lumps. The best time to do this is after a hot shower or bath when the tissues are looser. Notify your caregivers of any lumps, tenderness or changes in size or shape immediately.

## 2013-09-07 NOTE — Progress Notes (Signed)
MEDICARE ANNUAL WELLNESS VISIT AND FOLLOW UP Assessment:   Suture removal- 7 sutures removed, tolerated well, wrapped in the office Cough- Stop smoking, zpak, codeine/phenergan cough syrup Up to date on all vaccines  Plan:   During the course of the visit the patient was educated and counseled about appropriate screening and preventive services including:    Pneumococcal vaccine   Influenza vaccine  Td vaccine  Screening electrocardiogram  Screening mammography  Bone densitometry screening  Colorectal cancer screening  Diabetes screening  Glaucoma screening  Nutrition counseling   Advanced directives: given papers  Screening recommendations, referrals: Vaccinations: Tdap vaccine not indicated Influenza vaccine not indicated Pneumococcal vaccine not indicated Shingles vaccine not indicated Hep B vaccine not indicated  Nutrition assessed and recommended  Colonoscopy up to date Recommended yearly ophthalmology/optometry visit for glaucoma screening and checkup Recommended yearly dental visit for hygiene and checkup Advanced directives - requested  Conditions/risks identified: BMI: Discussed weight loss, diet, and increase physical activity.  Increase physical activity: AHA recommends 150 minutes of physical activity a week.  Medications reviewed Diabetes is at goal, ACE/ARB therapy: Yes. Urinary Incontinence is not an issue: discussed non pharmacology and pharmacology options.  Fall risk: moderate- discussed PT, home fall assessment, medications.    Subjective:  Jesus Bush is a 68 y.o. male who presents for Medicare Annual Wellness Visit and suture removal Date of last medicare wellness visit was is unknown.  His blood pressure has been controlled at home, today their BP is BP: 102/60 mmHg He does not workout. He denies chest pain, shortness of breath, dizziness.  Hit his leg last Saturday with a power grinder, went to [facility] and got 7 sutures on  medial left knee, finished his ABX from the ER.  He states he has had a cough at night for 4-5 with yellow mucus, continues to smoke but states it is less. Denies SOB, Wheezing.   Names of Other Physician/Practitioners you currently use: 1. [facility] here for primary care 2. [facility] [place] 3. Dr. Deatra Ina Patient Care Team: Unk Pinto, MD as PCP - General (Internal Medicine)  Medication Review: Current Outpatient Prescriptions on File Prior to Visit  Medication Sig Dispense Refill  . aspirin 81 MG tablet Take 81 mg by mouth daily.      Marland Kitchen atorvastatin (LIPITOR) 40 MG tablet Take 40 mg by mouth daily.      . cephALEXin (KEFLEX) 500 MG capsule Take 1 capsule (500 mg total) by mouth 4 (four) times daily.  28 capsule  0  . Cholecalciferol (VITAMIN D) 2000 UNITS CAPS Take 1 capsule by mouth daily.      . enalapril (VASOTEC) 20 MG tablet Take 20 mg by mouth daily.      Marland Kitchen HYDROcodone-acetaminophen (NORCO) 5-325 MG per tablet Take 1 tablet by mouth every 6 (six) hours as needed for moderate pain.  15 tablet  0  . magnesium gluconate (MAGONATE) 500 MG tablet Take 500 mg by mouth 2 (two) times daily.      . metFORMIN (GLUMETZA) 500 MG (MOD) 24 hr tablet Take 500 mg by mouth daily with breakfast.      . pantoprazole (PROTONIX) 40 MG tablet Take 40 mg by mouth daily.       No current facility-administered medications on file prior to visit.    Current Problems (verified) Patient Active Problem List   Diagnosis Date Noted  . Unspecified essential hypertension 02/11/2013  . Mixed hyperlipidemia 02/11/2013  .  Unspecified vitamin D deficiency 02/11/2013  . Diabetes 05/12/2012    Screening Tests Health Maintenance  Topic Date Due  . Tetanus/tdap  02/03/1964  . Pneumococcal Polysaccharide Vaccine Age 47 And Over  02/02/2010  . Influenza Vaccine  10/24/2013  . Colonoscopy  08/14/2017  . Zostavax  Completed    Immunization History  Administered  Date(s) Administered  . DTaP 05/23/2006  . Influenza Whole 12/16/2012  . Pneumococcal Polysaccharide-23 09/13/2008  . Zoster 04/17/2013    Preventative care: Last colonoscopy: 2009  Prior vaccinations: TD or Tdap: 2008  Influenza: 2014 Pneumococcal: 2010 Shingles/Zostavax: 2015  History reviewed: allergies, current medications, past family history, past medical history, past social history, past surgical history and problem list   Risk Factors: Tobacco History  Substance Use Topics  . Smoking status: Current Every Day Smoker -- 0.03 packs/day for 10 years    Types: Cigarettes  . Smokeless tobacco: Never Used  . Alcohol Use: Yes     Comment: liquor- once a week   He does smoke.   Are there smokers in your home (other than you)?  Yes  Alcohol Current alcohol use: social drinker  Caffeine Current caffeine use: coffee 2 /day  Exercise Current exercise: none  Nutrition/Diet Current diet: in general, a "healthy" diet    Cardiac risk factors: advanced age (older than 66 for men, 18 for women), dyslipidemia, family history of premature cardiovascular disease, hypertension, male gender, sedentary lifestyle and smoking/ tobacco exposure.  Depression Screen (Note: if answer to either of the following is "Yes", a more complete depression screening is indicated)   Q1: Over the past two weeks, have you felt down, depressed or hopeless? No  Q2: Over the past two weeks, have you felt little interest or pleasure in doing things? No  Have you lost interest or pleasure in daily life? No  Do you often feel hopeless? No  Do you cry easily over simple problems? No  Activities of Daily Living In your present state of health, do you have any difficulty performing the following activities?:  Driving? No Managing money?  No Feeding yourself? No Getting from bed to chair? No Climbing a flight of stairs? No Preparing food and eating?: No Bathing or showering? No Getting dressed:  No Getting to the toilet? No Using the toilet:No Moving around from place to place: No In the past year have you fallen or had a near fall?:No   Are you sexually active?  Yes  Do you have more than one partner?  No  Vision Difficulties: No  Hearing Difficulties: No Do you often ask people to speak up or repeat themselves? No Do you experience ringing or noises in your ears? No Do you have difficulty understanding soft or whispered voices? No  Cognition  Do you feel that you have a problem with memory?No  Do you often misplace items? No  Do you feel safe at home?  Yes  Advanced directives Does patient have a [place]? No Does patient have a Living Will? No   Objective:   Blood pressure 102/60, pulse 84, temperature 98.6 F (37 C), resp. rate 16, weight 146 lb (66.225 kg). Body mass index is 20.95 kg/(m^2).  General appearance: alert, no distress, WD/WN, male Cognitive Testing  Alert? Yes  Normal Appearance?Yes  Oriented to person? Yes  Place? Yes   Time? Yes  Recall of three objects?  Yes  Can perform simple calculations? Yes  Displays appropriate judgment?Yes  Can read the correct  time from a watch face?Yes  HEENT: normocephalic, sclerae anicteric, TMs pearly, nares patent, no discharge or erythema, pharynx normal Oral cavity: MMM, no lesions Neck: supple, no lymphadenopathy, no thyromegaly, no masses Heart: RRR, normal S1, S2, no murmurs Lungs: CTA bilaterally, no wheezes, rhonchi, or rales Abdomen: +bs, soft, non tender, non distended, no masses, no hepatomegaly, no splenomegaly Musculoskeletal: nontender, no swelling, no obvious deformity Extremities: no edema, no cyanosis, no clubbing. Left knee with well healing scar on medial knee with 7 sutures, mild erythema, no warmth, discharge, swelling.  Pulses: 2+ symmetric, upper and lower extremities, normal cap refill Neurological: alert, oriented x 3, CN2-12 intact, strength normal upper  extremities and lower extremities, sensation normal throughout, DTRs 2+ throughout, no cerebellar signs, gait normal Psychiatric: normal affect, behavior normal, pleasant   Medicare Attestation I have personally reviewed: The patient's medical and social history Their use of alcohol, tobacco or illicit drugs Their current medications and supplements The patient's functional ability including ADLs,fall risks, home safety risks, cognitive, and hearing and visual impairment Diet and physical activities Evidence for depression or mood disorders  The patient's weight, height, BMI, and visual acuity have been recorded in the chart.  I have made referrals, counseling, and provided education to the patient based on review of the above and I have provided the patient with a written personalized care plan for preventive services.     Vicie Mutters, PA-C   09/07/2013

## 2013-09-21 ENCOUNTER — Ambulatory Visit (INDEPENDENT_AMBULATORY_CARE_PROVIDER_SITE_OTHER): Payer: Commercial Managed Care - HMO | Admitting: Physician Assistant

## 2013-09-21 ENCOUNTER — Encounter: Payer: Self-pay | Admitting: Physician Assistant

## 2013-09-21 ENCOUNTER — Other Ambulatory Visit: Payer: Self-pay

## 2013-09-21 VITALS — BP 122/62 | HR 72 | Temp 97.9°F | Resp 16 | Ht 70.0 in | Wt 146.0 lb

## 2013-09-21 DIAGNOSIS — E559 Vitamin D deficiency, unspecified: Secondary | ICD-10-CM

## 2013-09-21 DIAGNOSIS — E782 Mixed hyperlipidemia: Secondary | ICD-10-CM

## 2013-09-21 DIAGNOSIS — I10 Essential (primary) hypertension: Secondary | ICD-10-CM

## 2013-09-21 DIAGNOSIS — Z79899 Other long term (current) drug therapy: Secondary | ICD-10-CM

## 2013-09-21 DIAGNOSIS — E119 Type 2 diabetes mellitus without complications: Secondary | ICD-10-CM

## 2013-09-21 LAB — CBC WITH DIFFERENTIAL/PLATELET
Basophils Absolute: 0.1 10*3/uL (ref 0.0–0.1)
Basophils Relative: 1 % (ref 0–1)
EOS ABS: 0.1 10*3/uL (ref 0.0–0.7)
EOS PCT: 1 % (ref 0–5)
HCT: 42.2 % (ref 39.0–52.0)
HEMOGLOBIN: 14.3 g/dL (ref 13.0–17.0)
Lymphocytes Relative: 24 % (ref 12–46)
Lymphs Abs: 1.8 10*3/uL (ref 0.7–4.0)
MCH: 31.6 pg (ref 26.0–34.0)
MCHC: 33.9 g/dL (ref 30.0–36.0)
MCV: 93.4 fL (ref 78.0–100.0)
MONO ABS: 0.5 10*3/uL (ref 0.1–1.0)
MONOS PCT: 6 % (ref 3–12)
NEUTROS PCT: 68 % (ref 43–77)
Neutro Abs: 5.1 10*3/uL (ref 1.7–7.7)
Platelets: 426 10*3/uL — ABNORMAL HIGH (ref 150–400)
RBC: 4.52 MIL/uL (ref 4.22–5.81)
RDW: 13.4 % (ref 11.5–15.5)
WBC: 7.5 10*3/uL (ref 4.0–10.5)

## 2013-09-21 LAB — HEMOGLOBIN A1C
Hgb A1c MFr Bld: 6.1 % — ABNORMAL HIGH (ref <5.7)
MEAN PLASMA GLUCOSE: 128 mg/dL — AB (ref <117)

## 2013-09-21 MED ORDER — PANTOPRAZOLE SODIUM 40 MG PO TBEC: 40.0000 mg | DELAYED_RELEASE_TABLET | Freq: Every day | ORAL | Status: DC

## 2013-09-21 NOTE — Progress Notes (Signed)
Assessment and Plan:  Hypertension: Continue medication, monitor blood pressure at home. Continue DASH diet. Cholesterol: Continue diet and exercise. Check cholesterol.  Diabetes-Continue diet and exercise. Check A1C Vitamin D Def- check level and continue medications.  Cerumen impaction- stop using Qtips, use OTC drops/oil, if not better come to the office for irrigation  Continue diet and meds as discussed. Further disposition pending results of labs. Discussed med's effects and SE's.    HPI 68 y.o. male  presents for 3 month follow up with hypertension, hyperlipidemia, diabetes and vitamin D. His blood pressure has been controlled at home, today their BP is BP: 122/62 mmHg He does not workout. He denies chest pain, shortness of breath, dizziness.  He is on cholesterol medication and denies myalgias. His cholesterol is not at goal. The cholesterol last visit was:   Lab Results  Component Value Date   CHOL 187 02/12/2013   HDL 38* 02/12/2013   LDLCALC 103* 02/12/2013   TRIG 229* 02/12/2013   CHOLHDL 4.9 02/12/2013   He has been working on diet and exercise for Diabetes, and denies polydipsia and polyuria. Last A1C in the office was:  Lab Results  Component Value Date   HGBA1C 5.9* 02/12/2013   Patient is on Vitamin D supplement. Lab Results  Component Value Date   VD25OH 74 02/12/2013      Current Medications:  Current Outpatient Prescriptions on File Prior to Visit  Medication Sig Dispense Refill  . aspirin 81 MG tablet Take 81 mg by mouth daily.      Marland Kitchen atorvastatin (LIPITOR) 40 MG tablet Take 40 mg by mouth daily.      . cephALEXin (KEFLEX) 500 MG capsule Take 1 capsule (500 mg total) by mouth 4 (four) times daily.  28 capsule  0  . Cholecalciferol (VITAMIN D) 2000 UNITS CAPS Take 1 capsule by mouth daily.      . enalapril (VASOTEC) 20 MG tablet Take 20 mg by mouth daily.      Marland Kitchen HYDROcodone-acetaminophen (NORCO) 5-325 MG per tablet Take 1 tablet by mouth every 6 (six)  hours as needed for moderate pain.  15 tablet  0  . magnesium gluconate (MAGONATE) 500 MG tablet Take 500 mg by mouth 2 (two) times daily.      . metFORMIN (GLUMETZA) 500 MG (MOD) 24 hr tablet Take 500 mg by mouth daily with breakfast.      . pantoprazole (PROTONIX) 40 MG tablet Take 40 mg by mouth daily.      . promethazine-codeine (PHENERGAN WITH CODEINE) 6.25-10 MG/5ML syrup Take 5 mLs by mouth every 6 (six) hours as needed for cough.  240 mL  0   No current facility-administered medications on file prior to visit.   Medical History:  Past Medical History  Diagnosis Date  . Vitamin D deficiency   . Diabetes mellitus without complication   . Hypertension   . Hyperlipidemia    Allergies:  Allergies  Allergen Reactions  . Ppd [Tuberculin Purified Protein Derivative]     Positive PPD.     Review of Systems: [X]  = complains of  [ ]  = denies  General: Fatigue [ ]  Fever [ ]  Chills [ ]  Weakness [ ]   Insomnia [ ]  Eyes: Redness [ ]  Blurred vision [ ]  Diplopia [ ]   ENT: Congestion [ ]  Sinus Pain [ ]  Post Nasal Drip [ ]  Sore Throat [ ]  Earache [ ]   Cardiac: Chest pain/pressure [ ]  SOB [ ]  Orthopnea [ ]   Palpitations [ ]   Paroxysmal nocturnal dyspnea[ ]  Claudication [ ]  Edema [ ]   Pulmonary: Cough [ ]  Wheezing[ ]   SOB [ ]   Snoring [ ]   GI: Nausea [ ]  Vomiting[ ]  Dysphagia[ ]  Heartburn[ ]  Abdominal pain [ ]  Constipation [ ] ; Diarrhea [ ] ; BRBPR [ ]  Melena[ ]  GU: Hematuria[ ]  Dysuria [ ]  Nocturia[ ]  Urgency [ ]   Hesitancy [ ]  Discharge [ ]  Neuro: Headaches[ ]  Vertigo[ ]  Paresthesias[ ]  Spasm [ ]  Speech changes [ ]  Incoordination [ ]   Ortho: Arthritis [ ]  Joint pain [ ]  Muscle pain [ ]  Joint swelling [ ]  Back Pain [ ]  Skin:  Rash [ ]   Pruritis [ ]  Change in skin lesion [ ]   Psych: Depression[ ]  Anxiety[ ]  Confusion [ ]  Memory loss [ ]   Heme/Lypmh: Bleeding [ ]  Bruising [ ]  Enlarged lymph nodes [ ]   Endocrine: Visual blurring [ ]  Paresthesia [ ]  Polyuria [ ]  Polydypsea [ ]    Heat/cold  intolerance [ ]  Hypoglycemia [ ]   Family history- Review and unchanged Social history- Review and unchanged Physical Exam: BP 122/62  Pulse 72  Temp(Src) 97.9 F (36.6 C)  Resp 16  Ht 5\' 10"  (1.778 m)  Wt 146 lb (66.225 kg)  BMI 20.95 kg/m2 Wt Readings from Last 3 Encounters:  09/21/13 146 lb (66.225 kg)  09/07/13 146 lb (66.225 kg)  08/30/13 155 lb (70.308 kg)   General Appearance: Well nourished, in no apparent distress. Eyes: PERRLA, EOMs, conjunctiva no swelling or erythema Sinuses: No Frontal/maxillary tenderness ENT/Mouth: Ext aud canals clear on right, left with cerumen impaction, TMs without erythema, bulging on right. No erythema, swelling, or exudate on post pharynx.  Tonsils not swollen or erythematous. Hearing normal.  Neck: Supple, thyroid normal.  Respiratory: Respiratory effort normal, BS equal bilaterally without rales, rhonchi, wheezing or stridor.  Cardio: RRR with no MRGs. Brisk peripheral pulses without edema.  Abdomen: Soft, + BS.  Non tender, no guarding, rebound, hernias, masses. Lymphatics: Non tender without lymphadenopathy.  Musculoskeletal: Full ROM, 5/5 strength, normal gait. Left knee with well healing scar on medial knee, without erythema, no warmth, discharge, or swelling.  Skin: Warm, dry without rashes, lesions, ecchymosis.  Neuro: Cranial nerves intact. No cerebellar symptoms. Sensation intact.  Psych: Awake and oriented X 3, normal affect, Insight and Judgment appropriate.    Vicie Mutters 9:04 AM

## 2013-09-21 NOTE — Patient Instructions (Signed)
Use a dropper to put olive oil or canola oil in the effected ear- 2-3 times a week. Let it soak for 20-30 min then you can take a shower or use a baby bulb with warm water to wash out the ear wax.  Do not use Qtips  Your LDL is the bad cholesterol that can lead to heart attack and stroke. To lower your number you can decrease your fatty foods, red meat, cheese, milk and increase fiber like whole grains and veggies. You can also add a fiber supplement like Metamucil or Benefiber.     Bad carbs also include fruit juice, alcohol, and sweet tea. These are empty calories that do not signal to your brain that you are full.   Please remember the good carbs are still carbs which convert into sugar. So please measure them out no more than 1/2-1 cup of rice, oatmeal, pasta, and beans.  Veggies are however free foods! Pile them on.   I like lean protein at every meal such as chicken, [place], pork chops, cottage cheese, etc. Just do not fry these meats and please center your meal around vegetable, the meats should be a side dish.   No all fruit is created equal. Please see the list below, the fruit at the bottom is higher in sugars than the fruit at the top

## 2013-09-22 LAB — BASIC METABOLIC PANEL WITH GFR
BUN: 10 mg/dL (ref 6–23)
CALCIUM: 9.6 mg/dL (ref 8.4–10.5)
CO2: 28 meq/L (ref 19–32)
Chloride: 104 mEq/L (ref 96–112)
Creat: 0.71 mg/dL (ref 0.50–1.35)
GFR, Est African American: >89 mL/min
GFR, Est Non African American: >89 mL/min
Glucose, Bld: 104 mg/dL — ABNORMAL HIGH (ref 70–99)
Potassium: 4.4 mEq/L (ref 3.5–5.3)
SODIUM: 141 meq/L (ref 135–145)

## 2013-09-22 LAB — MAGNESIUM: Magnesium: 1.6 mg/dL (ref 1.5–2.5)

## 2013-09-22 LAB — LIPID PANEL
Cholesterol: 168 mg/dL (ref 0–200)
HDL: 31 mg/dL — ABNORMAL LOW (ref >39)
LDL CALC: 88 mg/dL (ref 0–99)
Total CHOL/HDL Ratio: 5.4 Ratio
Triglycerides: 247 mg/dL — ABNORMAL HIGH (ref <150)
VLDL: 49 mg/dL — AB (ref 0–40)

## 2013-09-22 LAB — HEPATIC FUNCTION PANEL
ALT: 20 U/L (ref 0–53)
AST: 19 U/L (ref 0–37)
Albumin: 4 g/dL (ref 3.5–5.2)
Alkaline Phosphatase: 82 U/L (ref 39–117)
BILIRUBIN DIRECT: 0.1 mg/dL (ref 0.0–0.3)
Indirect Bilirubin: 0.2 mg/dL (ref 0.2–1.2)
TOTAL PROTEIN: 6.9 g/dL (ref 6.0–8.3)
Total Bilirubin: 0.3 mg/dL (ref 0.2–1.2)

## 2013-09-22 LAB — TSH: TSH: 0.494 u[IU]/mL (ref 0.350–4.500)

## 2013-09-22 LAB — VITAMIN D 25 HYDROXY (VIT D DEFICIENCY, FRACTURES): Vit D, 25-Hydroxy: 48 ng/mL (ref 30–89)

## 2013-09-22 LAB — INSULIN, FASTING: Insulin fasting, serum: 45 u[IU]/mL — ABNORMAL HIGH (ref 3–28)

## 2013-09-23 ENCOUNTER — Other Ambulatory Visit: Payer: Self-pay | Admitting: *Deleted

## 2013-09-23 MED ORDER — PANTOPRAZOLE SODIUM 40 MG PO TBEC: 40.0000 mg | DELAYED_RELEASE_TABLET | Freq: Every day | ORAL | Status: DC

## 2013-10-28 ENCOUNTER — Ambulatory Visit (INDEPENDENT_AMBULATORY_CARE_PROVIDER_SITE_OTHER): Payer: Commercial Managed Care - HMO | Admitting: *Deleted

## 2013-10-28 DIAGNOSIS — H6120 Impacted cerumen, unspecified ear: Secondary | ICD-10-CM

## 2013-10-28 DIAGNOSIS — H6122 Impacted cerumen, left ear: Secondary | ICD-10-CM

## 2013-10-28 NOTE — Progress Notes (Signed)
Patient ID: Jesus Bush, male   DOB: 02-Sep-1945, 68 y.o.   MRN: [id number] Patient presents for ear lavage for left ear cerumen impaction.  Successful lavage and clearing of impaction, patient tolerated well.

## 2013-12-30 ENCOUNTER — Ambulatory Visit: Payer: Self-pay | Admitting: Internal Medicine

## 2014-01-08 ENCOUNTER — Other Ambulatory Visit: Payer: Self-pay

## 2014-02-16 ENCOUNTER — Encounter: Payer: Self-pay | Admitting: Internal Medicine

## 2014-02-22 ENCOUNTER — Other Ambulatory Visit: Payer: Self-pay | Admitting: *Deleted

## 2014-02-22 MED ORDER — METFORMIN HCL ER (MOD) 500 MG PO TB24: 500.0000 mg | ORAL_TABLET | Freq: Every day | ORAL | Status: DC

## 2014-02-22 MED ORDER — ENALAPRIL MALEATE 20 MG PO TABS: 20.0000 mg | ORAL_TABLET | Freq: Every day | ORAL | Status: DC

## 2014-02-22 MED ORDER — ATORVASTATIN CALCIUM 40 MG PO TABS: 40.0000 mg | ORAL_TABLET | Freq: Every day | ORAL | Status: DC

## 2014-02-22 MED ORDER — PANTOPRAZOLE SODIUM 40 MG PO TBEC: 40.0000 mg | DELAYED_RELEASE_TABLET | Freq: Every day | ORAL | Status: DC

## 2014-03-02 ENCOUNTER — Other Ambulatory Visit: Payer: Self-pay | Admitting: *Deleted

## 2014-03-02 MED ORDER — METFORMIN HCL ER 500 MG PO TB24: 500.0000 mg | ORAL_TABLET | Freq: Every day | ORAL | Status: DC

## 2014-03-10 ENCOUNTER — Encounter: Payer: Self-pay | Admitting: Physician Assistant

## 2014-03-10 ENCOUNTER — Ambulatory Visit (HOSPITAL_COMMUNITY)
Admission: RE | Admit: 2014-03-10 | Discharge: 2014-03-10 | Disposition: A | Discharge status: Home / Self Care | Payer: Medicare PPO | Source: Ambulatory Visit | Attending: Physician Assistant | Admitting: Physician Assistant

## 2014-03-10 ENCOUNTER — Ambulatory Visit (INDEPENDENT_AMBULATORY_CARE_PROVIDER_SITE_OTHER): Payer: Commercial Managed Care - HMO | Admitting: Physician Assistant

## 2014-03-10 VITALS — BP 118/70 | HR 92 | Temp 98.2°F | Resp 16 | Ht 70.0 in | Wt 150.0 lb

## 2014-03-10 DIAGNOSIS — R059 Cough, unspecified: Secondary | ICD-10-CM

## 2014-03-10 DIAGNOSIS — R05 Cough: Secondary | ICD-10-CM | POA: Diagnosis present

## 2014-03-10 DIAGNOSIS — R062 Wheezing: Secondary | ICD-10-CM | POA: Insufficient documentation

## 2014-03-10 DIAGNOSIS — R0989 Other specified symptoms and signs involving the circulatory and respiratory systems: Secondary | ICD-10-CM | POA: Diagnosis not present

## 2014-03-10 DIAGNOSIS — J209 Acute bronchitis, unspecified: Secondary | ICD-10-CM

## 2014-03-10 MED ORDER — PROMETHAZINE-CODEINE 6.25-10 MG/5ML PO SYRP: 5.0000 mL | ORAL_SOLUTION | Freq: Four times a day (QID) | ORAL | Status: DC | PRN

## 2014-03-10 MED ORDER — AZITHROMYCIN 250 MG PO TABS: ORAL_TABLET | ORAL | Status: AC

## 2014-03-10 MED ORDER — PREDNISONE 20 MG PO TABS: ORAL_TABLET | ORAL | Status: AC

## 2014-03-10 MED ORDER — ALBUTEROL SULFATE HFA 108 (90 BASE) MCG/ACT IN AERS: 2.0000 | INHALATION_SPRAY | RESPIRATORY_TRACT | Status: DC | PRN

## 2014-03-10 NOTE — Progress Notes (Signed)
Subjective:    Patient ID: Jesus Bush, male    DOB: 1945-08-25, 68 y.o.   MRN: [id number]  Cough This is a new problem. Episode onset: 3 weeks ago  Cough characteristics: yellow mucus. Associated symptoms include chills, postnasal drip and rhinorrhea. Pertinent negatives include no ear pain, fever, sore throat, shortness of breath or wheezing. Improvement on treatment: Sinus pills and NyQuil.  Sinus Problem Associated symptoms include chills, congestion, coughing and sinus pressure. Pertinent negatives include no diaphoresis, ear pain, shortness of breath or sore throat.  Currently smokes- Has not smoked any cigarettes in 1 week.  Was smoking 5-6 cigarettes a day for the past 10 years GFR= >89 on 09/21/13 Review of Systems  Constitutional: Positive for chills. Negative for fever, diaphoresis and fatigue.  HENT: Positive for congestion, postnasal drip, rhinorrhea and sinus pressure. Negative for ear discharge, ear pain, sore throat and trouble swallowing.   Eyes: Negative.   Respiratory: Positive for cough and chest tightness. Negative for shortness of breath and wheezing.   Cardiovascular: Negative.   Gastrointestinal: Negative.   Musculoskeletal:       Muscle Aches  Skin: Negative.   Neurological: Negative.    Past Medical History  Diagnosis Date  . Vitamin D deficiency   . Diabetes mellitus without complication   . Hypertension   . Hyperlipidemia    Current Outpatient Prescriptions on File Prior to Visit  Medication Sig Dispense Refill  . aspirin 81 MG tablet Take 81 mg by mouth daily.    Marland Kitchen atorvastatin (LIPITOR) 40 MG tablet Take 1 tablet (40 mg total) by mouth daily. 90 tablet 2  . Cholecalciferol (VITAMIN D) 2000 UNITS CAPS Take 1 capsule by mouth daily.    . enalapril (VASOTEC) 20 MG tablet Take 1 tablet (20 mg total) by mouth daily. 90 tablet 2  . HYDROcodone-acetaminophen (NORCO) 5-325 MG per tablet Take 1 tablet by mouth every 6 (six) hours as needed for moderate pain.  15 tablet 0  . magnesium gluconate (MAGONATE) 500 MG tablet Take 500 mg by mouth 2 (two) times daily.    . metFORMIN (GLUCOPHAGE-XR) 500 MG 24 hr tablet Take 1 tablet (500 mg total) by mouth daily with breakfast. 90 tablet 2  . pantoprazole (PROTONIX) 40 MG tablet Take 1 tablet (40 mg total) by mouth daily. 90 tablet 2   No current facility-administered medications on file prior to visit.   Allergies  Allergen Reactions  . Ppd [Tuberculin Purified Protein Derivative]     Positive PPD.     BP 118/70 mmHg  Pulse 92  Temp(Src) 98.2 F (36.8 C) (Temporal)  Resp 16  Ht 5\' 10"  (1.778 m)  Wt 150 lb (68.04 kg)  BMI 21.52 kg/m2  SpO2 98% Wt Readings from Last 3 Encounters:  03/10/14 150 lb (68.04 kg)  09/21/13 146 lb (66.225 kg)  09/07/13 146 lb (66.225 kg)   Objective:   Physical Exam  Constitutional: He is oriented to person, place, and time. He appears well-developed and well-nourished. He has a sickly appearance. No distress.  HENT:  Head: Normocephalic.  Right Ear: Tympanic membrane, external ear and ear canal normal.  Left Ear: Tympanic membrane, external ear and ear canal normal.  Nose: Nose normal. Right sinus exhibits no maxillary sinus tenderness and no frontal sinus tenderness. Left sinus exhibits no maxillary sinus tenderness and no frontal sinus tenderness.  Mouth/Throat: Uvula is midline and mucous membranes are normal. Mucous membranes are not pale and not dry. No trismus in the  jaw. No uvula swelling. Posterior oropharyngeal erythema present. No oropharyngeal exudate, posterior oropharyngeal edema or tonsillar abscesses.  Eyes: Conjunctivae and lids are normal. Pupils are equal, round, and reactive to light. Right eye exhibits no discharge. Left eye exhibits no discharge. No scleral icterus.  Neck: Trachea normal, normal range of motion and phonation normal. Neck supple. No tracheal tenderness present. No tracheal deviation present.  Cardiovascular: Normal rate, regular  rhythm, S1 normal, S2 normal, normal heart sounds and normal pulses.  Exam reveals no gallop, no distant heart sounds and no friction rub.   No murmur heard. Pulmonary/Chest: Effort normal. No accessory muscle usage or stridor. No respiratory distress. He has no decreased breath sounds. He has wheezes. He has no rhonchi. He has rales in the right middle field. He exhibits no tenderness.  Wheezing throughout all lung fields.  Abdominal: Soft. Bowel sounds are normal.  Lymphadenopathy:  No tenderness or LAD.  Neurological: He is alert and oriented to person, place, and time. Gait normal.  Skin: Skin is warm, dry and intact. No rash noted. He is not diaphoretic. No pallor.  Psychiatric: He has a normal mood and affect. His speech is normal and behavior is normal. Judgment and thought content normal. Cognition and memory are normal.  Vitals reviewed.  Assessment & Plan:  1. Acute bronchitis, unspecified organism -Take Z-Pak as prescribed- azithromycin (ZITHROMAX Z-PAK) 250 MG tablet; Take 2 tablets PO on day 1, then take 1 tablet PO QDaily for 4 days.  Dispense: 6 tablet; Refill: 1 -Take Prednisone as prescribed for inflammation- predniSONE (DELTASONE) 20 MG tablet; Take 3 tablets PO QDaily for 3 days, then take 2 tablets PO QDaily for 3 days, then take 1 tablet PO QDaily for 3 days  Dispense: 18 tablet; Refill: 0 - Take Promethazine-Codeine as prescribed for cough- promethazine-codeine (PHENERGAN WITH CODEINE) 6.25-10 MG/5ML syrup; Take 5 mLs by mouth every 6 (six) hours as needed for cough. Max: 38mL per day  Dispense: 240 mL; Refill: 0 - Take Albuterol inhaler as prescribed for wheezing- albuterol (VENTOLIN HFA) 108 (90 BASE) MCG/ACT inhaler; Inhale 2 puffs into the lungs every 4 (four) hours as needed for wheezing or shortness of breath.  Dispense: 1 Inhaler; Refill: 1  2. Cough Ordered chest x-ray to R/O Pneumonia - DG Chest 2 View; Future  Discussed medication effects and SE's.  Pt agreed to  treatment plan. If you are not feeling better in 10-14 days, then please call the office. Please keep your physical appt on 04/13/14.  Addendum: Chest x-ray came back with Stable chronic pulmonary interstitial prominence. No acute findings.  Continue medications as discussed at this visit.  Bianco Cange, Stephani Police, PA-C 1:40 PM [facility]

## 2014-03-10 NOTE — Patient Instructions (Addendum)
-Take Z-Pak as prescribed -Take Prednisone as prescribed for inflammation -Take Promethazine-codeine as prescribed for cough. -Take Albuterol inhaler as prescribed for wheezing.   -Get chest x-ray at [facility] hospital. -Congrats on the 1 week of no smoking, keep up the good work.  Make sure you are drinking plenty of fluids to stay hydrated.  If you are not feeling better in 10-14 days, then please call the office  Acute Bronchitis Bronchitis is inflammation of the airways that extend from the windpipe into the lungs (bronchi). The inflammation often causes mucus to develop. This leads to a cough, which is the most common symptom of bronchitis.  In acute bronchitis, the condition usually develops suddenly and goes away over time, usually in a couple weeks. Smoking, allergies, and asthma can make bronchitis worse. Repeated episodes of bronchitis may cause further lung problems.  CAUSES Acute bronchitis is most often caused by the same virus that causes a cold. The virus can spread from person to person (contagious) through coughing, sneezing, and touching contaminated objects. SIGNS AND SYMPTOMS   Cough.   Fever.   Coughing up mucus.   Body aches.   Chest congestion.   Chills.   Shortness of breath.   Sore throat.  DIAGNOSIS  Acute bronchitis is usually diagnosed through a physical exam. Your health care provider will also ask you questions about your medical history. Tests, such as chest X-rays, are sometimes done to rule out other conditions.  TREATMENT  Acute bronchitis usually goes away in a couple weeks. Oftentimes, no medical treatment is necessary. Medicines are sometimes given for relief of fever or cough. Antibiotic medicines are usually not needed but may be prescribed in certain situations. In some cases, an inhaler may be recommended to help reduce shortness of breath and control the cough. A cool mist vaporizer may also be used to help thin bronchial secretions  and make it easier to clear the chest.  HOME CARE INSTRUCTIONS  Get plenty of rest.   Drink enough fluids to keep your urine clear or pale yellow (unless you have a medical condition that requires fluid restriction). Increasing fluids may help thin your respiratory secretions (sputum) and reduce chest congestion, and it will prevent dehydration.   Take medicines only as directed by your health care provider.  If you were prescribed an antibiotic medicine, finish it all even if you start to feel better.  Avoid smoking and secondhand smoke. Exposure to cigarette smoke or irritating chemicals will make bronchitis worse. If you are a smoker, consider using nicotine gum or skin patches to help control withdrawal symptoms. Quitting smoking will help your lungs heal faster.   Reduce the chances of another bout of acute bronchitis by washing your hands frequently, avoiding people with cold symptoms, and trying not to touch your hands to your mouth, nose, or eyes.   Keep all follow-up visits as directed by your health care provider.  SEEK MEDICAL CARE IF: Your symptoms do not improve after 1 week of treatment.  SEEK IMMEDIATE MEDICAL CARE IF:  You develop an increased fever or chills.   You have chest pain.   You have severe shortness of breath.  You have bloody sputum.   You develop dehydration.  You faint or repeatedly feel like you are going to pass out.  You develop repeated vomiting.  You develop a severe headache. MAKE SURE YOU:   Understand these instructions.  Will watch your condition.  Will get help right away if you are not doing  well or get worse. Document Released: 04/19/2004 Document Revised: 07/27/2013 Document Reviewed: 09/02/2012 [facility] Patient Information 2015 [place], [place]. This information is not intended to replace advice given to you by your health care provider. Make sure you discuss any questions you have with your health care provider.

## 2014-04-13 ENCOUNTER — Ambulatory Visit (INDEPENDENT_AMBULATORY_CARE_PROVIDER_SITE_OTHER): Payer: Commercial Managed Care - HMO | Admitting: Internal Medicine

## 2014-04-13 ENCOUNTER — Encounter: Payer: Self-pay | Admitting: Internal Medicine

## 2014-04-13 VITALS — BP 114/82 | HR 76 | Temp 98.1°F | Resp 16 | Ht 70.0 in | Wt 150.4 lb

## 2014-04-13 DIAGNOSIS — E559 Vitamin D deficiency, unspecified: Secondary | ICD-10-CM

## 2014-04-13 DIAGNOSIS — Z1212 Encounter for screening for malignant neoplasm of rectum: Secondary | ICD-10-CM

## 2014-04-13 DIAGNOSIS — Z9181 History of falling: Secondary | ICD-10-CM

## 2014-04-13 DIAGNOSIS — I10 Essential (primary) hypertension: Secondary | ICD-10-CM | POA: Diagnosis not present

## 2014-04-13 DIAGNOSIS — Z79899 Other long term (current) drug therapy: Secondary | ICD-10-CM | POA: Diagnosis not present

## 2014-04-13 DIAGNOSIS — E119 Type 2 diabetes mellitus without complications: Secondary | ICD-10-CM

## 2014-04-13 DIAGNOSIS — Z125 Encounter for screening for malignant neoplasm of prostate: Secondary | ICD-10-CM

## 2014-04-13 DIAGNOSIS — Z23 Encounter for immunization: Secondary | ICD-10-CM

## 2014-04-13 DIAGNOSIS — E782 Mixed hyperlipidemia: Secondary | ICD-10-CM | POA: Diagnosis not present

## 2014-04-13 DIAGNOSIS — Z1331 Encounter for screening for depression: Secondary | ICD-10-CM

## 2014-04-13 LAB — HEMOGLOBIN A1C
Hgb A1c MFr Bld: 6.4 % — ABNORMAL HIGH (ref <5.7)
MEAN PLASMA GLUCOSE: 137 mg/dL — AB (ref <117)

## 2014-04-13 MED ORDER — DILTIAZEM HCL ER 240 MG PO CP24: ORAL_CAPSULE | ORAL | Status: DC

## 2014-04-13 NOTE — Progress Notes (Addendum)
Patient ID: Jesus Bush, male   DOB: [birth year], 69 y.o.   MRN: [id number]   This very nice 69 y.o. MBM presents for CPE and  follow up with Hypertension, Hyperlipidemia, T2_NIDDM and Vitamin D Deficiency.    Patient is treated for HTN since 2005 & BP has been controlled at home. Today's BP: 114/82 mmHg. Patient has had no complaints of any cardiac type chest pain, palpitations, dyspnea/orthopnea/PND, dizziness, claudication, or dependent edema. While patient was having his EKG done , he was observed to transition from NSR to a short run of pSVT lasting a couple of minutes and repeat EKG showed resumption of sinus rhythm. Patient remained asymptomatic throughout the episode.    Hyperlipidemia is controlled with diet & meds. Patient denies myalgias or other med SE's. Last Lipids were at goal - Total Chol 168; HDL  31; LDL  88; and elevated Trig 247 on 09/21/2013.   Also, the patient has history of T2_NIDDM since June 2010 with an A1c 7.5%  and has had no symptoms of reactive hypoglycemia, diabetic polys, paresthesias or visual blurring.  Last A1c was 6.1% on 09/21/2013.    Further, the patient also has history of Vitamin D Deficiency of 23 in 2008 and supplements vitamin D without any suspected side-effects. Last vitamin D was 48 on 09/21/2013.  Medication Sig  . albuterol  HFA inhaler Inhale 2 puffs into the lungs every 4 (four) hours as needed for wheezing or shortness of breath.  Marland Kitchen aspirin 81 MG tablet Take 81 mg by mouth daily.  Marland Kitchen atorvastatin (LIPITOR) 40 MG tablet Take 1 tablet (40 mg total) by mouth daily.  . Cholecalciferol (VITAMIN D) 2000 UNITS CAPS Take 1 capsule by mouth daily.  . enalapril (VASOTEC) 20 MG tablet Take 1 tablet (20 mg total) by mouth daily.  Lebron Quam 5-325 MG per tablet Take 1 tablet by mouth every 6 (six) hours as needed for moderate pain.  . magnesium gluconate (MAGONATE) 500 MG tablet Take 500 mg by mouth 2 (two) times daily.  . metFORMIN -XR 500 MG 24 hr tablet Take 1  tablet (500 mg total) by mouth daily with breakfast.  . pantoprazole (PROTONIX) 40 MG tablet Take 1 tablet (40 mg total) by mouth daily.   Allergies  Allergen Reactions  . Ppd [Tuberculin Purified Protein Derivative]     Positive PPD.   PMHx:   Past Medical History  Diagnosis Date  . Vitamin D deficiency   . Diabetes mellitus without complication   . Hypertension   . Hyperlipidemia    Immunization History  Administered Date(s) Administered  . DTaP 05/23/2006  . Influenza Whole 12/16/2012  . Influenza-Unspecified 12/27/2013  . Pneumococcal Conjugate-13 04/13/2014  . Pneumococcal Polysaccharide-23 09/13/2008  . Zoster 04/17/2013   FHx:    Reviewed / unchanged  SHx:    Reviewed / unchanged  Systems Review:  Constitutional: Denies fever, chills, wt changes, headaches, insomnia, fatigue, night sweats, change in appetite. Eyes: Denies redness, blurred vision, diplopia, discharge, itchy, watery eyes.  ENT: Denies discharge, congestion, post nasal drip, epistaxis, sore throat, earache, hearing loss, dental pain, tinnitus, vertigo, sinus pain, snoring.  CV: Denies chest pain, palpitations, irregular heartbeat, syncope, dyspnea, diaphoresis, orthopnea, PND, claudication or edema. Respiratory: denies cough, dyspnea, DOE, pleurisy, hoarseness, laryngitis, wheezing.  Gastrointestinal: Denies dysphagia, odynophagia, heartburn, reflux, water brash, abdominal pain or cramps, nausea, vomiting, bloating, diarrhea, constipation, hematemesis, melena, hematochezia  or hemorrhoids. Genitourinary: Denies dysuria, frequency, urgency, nocturia, hesitancy, discharge, hematuria or flank pain. Musculoskeletal:  Denies arthralgias, myalgias, stiffness, jt. swelling, pain, limping or strain/sprain.  Skin: Denies pruritus, rash, hives, warts, acne, eczema or change in skin lesion(s). Neuro: No weakness, tremor, incoordination, spasms, paresthesia or pain. Psychiatric: Denies confusion, memory loss or sensory  loss. Endo: Denies change in weight, skin or hair change.  Heme/Lymph: No excessive bleeding, bruising or enlarged lymph nodes.  Physical Exam  BP 114/82  Pulse 76  Temp 98.1 F   Resp 16  Ht 5\' 10"   Wt 150 lb 6.4 oz    BMI 21.58   Appears well nourished and in no distress. Eyes: PERRLA, EOMs, conjunctiva no swelling or erythema. Sinuses: No frontal/maxillary tenderness ENT/Mouth: EAC's clear, TM's nl w/o erythema, bulging. Nares clear w/o erythema, swelling, exudates. Oropharynx clear without erythema or exudates. Oral hygiene is good. Tongue normal, non obstructing. Hearing intact.  Neck: Supple. Thyroid nl. Car 2+/2+ without bruits, nodes or JVD. Chest: Respirations nl with BS clear & equal w/o rales, rhonchi, wheezing or stridor.  Cor: Heart sounds normal w/ regular rate and rhythm without sig. murmurs, gallops, clicks, or rubs. Peripheral pulses normal and equal  without edema.  Abdomen: Soft & bowel sounds normal. Non-tender w/o guarding, rebound, hernias, masses, or organomegaly.  Lymphatics: Unremarkable. GU: Nl male. Testes Nl. DRE - Nl Prostate for age w/o nodules & hemoccult Neg.  Musculoskeletal: Full ROM all peripheral extremities, joint stability, 5/5 strength, and normal gait.  Skin: Warm, dry without exposed rashes, lesions or ecchymosis apparent.  Neuro: Cranial nerves intact, reflexes equal bilaterally. Sensory-motor testing grossly intact with Nl monofilament and 2 pt discrimination testing to the distal toes. DTR's nl/+ in UE and KJ's 1+ and AJ's absent.  Pysch: Alert & oriented x 3.  Insight and judgement nl & appropriate. No ideations.  Assessment and Plan:  1. Essential hypertension  - Microalbumin / creatinine urine ratio - EKG 12-Lead - [place], RETROPERITNL ABD,  LTD - TSH  2. Mixed hyperlipidemia  - Lipid panel  3. Type 2 diabetes mellitus without complication  - HM DIABETES FOOT EXAM - LOW EXTREMITY NEUR EXAM DOCUM - Hemoglobin A1c - Insulin,  fasting  4. Vitamin D deficiency  - Vit D  25 hydroxy (rtn osteoporosis monitoring)  5. Screening for rectal cancer  - POC Hemoccult Bld/Stl (3-Cd Home Screen); Future  6. Prostate cancer screening  - PSA  7. Depression screen   8. At low risk for fall   9. Need for prophylactic vaccination against Streptococcus pneumoniae (pneumococcus)  - Pneumococcal conjugate vaccine 13-valent  10. Medication management  - Urine Microscopic - CBC with Differential - BASIC METABOLIC PANEL WITH GFR - Hepatic function panel - Magnesium  11. pSVT -   - Add Rx Diltiazem 240 ER #90 - x 1 yr - taking 1 daily.   Recommended regular exercise, BP monitoring, weight control, and discussed med and SE's. Recommended labs to assess and monitor clinical status. Further disposition pending results of labs.

## 2014-04-13 NOTE — Patient Instructions (Signed)

## 2014-04-14 LAB — CBC WITH DIFFERENTIAL/PLATELET
Basophils Absolute: 0.1 10*3/uL (ref 0.0–0.1)
Basophils Relative: 1 % (ref 0–1)
Eosinophils Absolute: 0.1 10*3/uL (ref 0.0–0.7)
Eosinophils Relative: 1 % (ref 0–5)
HCT: 46.7 % (ref 39.0–52.0)
HEMOGLOBIN: 15.5 g/dL (ref 13.0–17.0)
LYMPHS PCT: 29 % (ref 12–46)
Lymphs Abs: 1.8 10*3/uL (ref 0.7–4.0)
MCH: 31.7 pg (ref 26.0–34.0)
MCHC: 33.2 g/dL (ref 30.0–36.0)
MCV: 95.5 fL (ref 78.0–100.0)
MONO ABS: 0.6 10*3/uL (ref 0.1–1.0)
MPV: 10.6 fL (ref 8.6–12.4)
Monocytes Relative: 10 % (ref 3–12)
Neutro Abs: 3.7 10*3/uL (ref 1.7–7.7)
Neutrophils Relative %: 59 % (ref 43–77)
Platelets: 308 10*3/uL (ref 150–400)
RBC: 4.89 MIL/uL (ref 4.22–5.81)
RDW: 13.6 % (ref 11.5–15.5)
WBC: 6.3 10*3/uL (ref 4.0–10.5)

## 2014-04-14 LAB — URINALYSIS, MICROSCOPIC ONLY
BACTERIA UA: NONE SEEN
Casts: NONE SEEN
Crystals: NONE SEEN
Squamous Epithelial / LPF: NONE SEEN

## 2014-04-14 LAB — MAGNESIUM: Magnesium: 1.7 mg/dL (ref 1.5–2.5)

## 2014-04-14 LAB — LIPID PANEL
Cholesterol: 180 mg/dL (ref 0–200)
HDL: 45 mg/dL (ref >39)
LDL Cholesterol: 96 mg/dL (ref 0–99)
Total CHOL/HDL Ratio: 4 Ratio
Triglycerides: 197 mg/dL — ABNORMAL HIGH (ref <150)
VLDL: 39 mg/dL (ref 0–40)

## 2014-04-14 LAB — VITAMIN D 25 HYDROXY (VIT D DEFICIENCY, FRACTURES): Vit D, 25-Hydroxy: 48 ng/mL (ref 30–100)

## 2014-04-14 LAB — BASIC METABOLIC PANEL WITH GFR
BUN: 8 mg/dL (ref 6–23)
CALCIUM: 9.9 mg/dL (ref 8.4–10.5)
CO2: 28 mEq/L (ref 19–32)
CREATININE: 0.89 mg/dL (ref 0.50–1.35)
Chloride: 99 mEq/L (ref 96–112)
GFR, EST NON AFRICAN AMERICAN: 87 mL/min
GFR, Est African American: >89 mL/min
Glucose, Bld: 100 mg/dL — ABNORMAL HIGH (ref 70–99)
POTASSIUM: 4.5 meq/L (ref 3.5–5.3)
SODIUM: 136 meq/L (ref 135–145)

## 2014-04-14 LAB — HEPATIC FUNCTION PANEL
ALBUMIN: 4.3 g/dL (ref 3.5–5.2)
ALK PHOS: 90 U/L (ref 39–117)
ALT: 19 U/L (ref 0–53)
AST: 17 U/L (ref 0–37)
BILIRUBIN DIRECT: 0.1 mg/dL (ref 0.0–0.3)
BILIRUBIN INDIRECT: 0.4 mg/dL (ref 0.2–1.2)
Total Bilirubin: 0.5 mg/dL (ref 0.2–1.2)
Total Protein: 7.2 g/dL (ref 6.0–8.3)

## 2014-04-14 LAB — MICROALBUMIN / CREATININE URINE RATIO
CREATININE, URINE: 165.8 mg/dL
MICROALB UR: 0.8 mg/dL (ref <2.0)
Microalb Creat Ratio: 4.8 mg/g (ref 0.0–30.0)

## 2014-04-14 LAB — TSH: TSH: 0.425 u[IU]/mL (ref 0.350–4.500)

## 2014-04-14 LAB — INSULIN, FASTING: Insulin fasting, serum: 9 u[IU]/mL (ref 2.0–19.6)

## 2014-04-14 LAB — PSA: PSA: 1.72 ng/mL (ref <=4.00)

## 2014-04-21 ENCOUNTER — Telehealth: Payer: Self-pay | Admitting: *Deleted

## 2014-04-21 NOTE — Telephone Encounter (Signed)
Patient's spouse called with concern about starting the new med, Cardiazem.  Per Dr Melford Aase, patient needs this med due to his EKG showing a short run of veryfast heart beats that resolved itself.  The Diltiazem helps to prevent a-fib.     Spouse aware.

## 2014-05-11 ENCOUNTER — Other Ambulatory Visit: Payer: Self-pay | Admitting: *Deleted

## 2014-05-11 ENCOUNTER — Other Ambulatory Visit: Payer: Self-pay | Admitting: Internal Medicine

## 2014-05-11 MED ORDER — LOSARTAN POTASSIUM 100 MG PO TABS: ORAL_TABLET | ORAL | Status: DC

## 2014-05-11 MED ORDER — METFORMIN HCL ER 500 MG PO TB24: 500.0000 mg | ORAL_TABLET | Freq: Every day | ORAL | Status: DC

## 2014-05-18 ENCOUNTER — Other Ambulatory Visit: Payer: Self-pay | Admitting: *Deleted

## 2014-05-18 MED ORDER — METFORMIN HCL ER 500 MG PO TB24: 500.0000 mg | ORAL_TABLET | Freq: Two times a day (BID) | ORAL | Status: DC

## 2014-05-27 ENCOUNTER — Other Ambulatory Visit: Payer: Self-pay | Admitting: *Deleted

## 2014-05-27 MED ORDER — DILTIAZEM HCL ER 240 MG PO CP24: ORAL_CAPSULE | ORAL | Status: DC

## 2014-06-09 DIAGNOSIS — E119 Type 2 diabetes mellitus without complications: Secondary | ICD-10-CM | POA: Diagnosis not present

## 2014-06-09 DIAGNOSIS — H25093 Other age-related incipient cataract, bilateral: Secondary | ICD-10-CM | POA: Diagnosis not present

## 2014-06-09 DIAGNOSIS — H319 Unspecified disorder of choroid: Secondary | ICD-10-CM | POA: Diagnosis not present

## 2014-06-09 DIAGNOSIS — H524 Presbyopia: Secondary | ICD-10-CM | POA: Diagnosis not present

## 2014-06-09 DIAGNOSIS — H5210 Myopia, unspecified eye: Secondary | ICD-10-CM | POA: Diagnosis not present

## 2014-07-04 ENCOUNTER — Encounter: Payer: Self-pay | Admitting: *Deleted

## 2014-07-23 ENCOUNTER — Encounter: Payer: Self-pay | Admitting: Physician Assistant

## 2014-07-23 ENCOUNTER — Ambulatory Visit (INDEPENDENT_AMBULATORY_CARE_PROVIDER_SITE_OTHER): Payer: Commercial Managed Care - HMO | Admitting: Physician Assistant

## 2014-07-23 VITALS — BP 132/70 | HR 72 | Temp 97.9°F | Resp 16 | Ht 70.0 in | Wt 153.0 lb

## 2014-07-23 DIAGNOSIS — Z79899 Other long term (current) drug therapy: Secondary | ICD-10-CM | POA: Diagnosis not present

## 2014-07-23 DIAGNOSIS — I10 Essential (primary) hypertension: Secondary | ICD-10-CM

## 2014-07-23 DIAGNOSIS — E559 Vitamin D deficiency, unspecified: Secondary | ICD-10-CM | POA: Diagnosis not present

## 2014-07-23 DIAGNOSIS — E119 Type 2 diabetes mellitus without complications: Secondary | ICD-10-CM

## 2014-07-23 DIAGNOSIS — E782 Mixed hyperlipidemia: Secondary | ICD-10-CM

## 2014-07-23 DIAGNOSIS — F17200 Nicotine dependence, unspecified, uncomplicated: Secondary | ICD-10-CM

## 2014-07-23 DIAGNOSIS — K3184 Gastroparesis: Secondary | ICD-10-CM

## 2014-07-23 DIAGNOSIS — Z87891 Personal history of nicotine dependence: Secondary | ICD-10-CM | POA: Insufficient documentation

## 2014-07-23 DIAGNOSIS — E0590 Thyrotoxicosis, unspecified without thyrotoxic crisis or storm: Secondary | ICD-10-CM

## 2014-07-23 LAB — MAGNESIUM: Magnesium: 1.8 mg/dL (ref 1.5–2.5)

## 2014-07-23 LAB — HEMOGLOBIN A1C
Hgb A1c MFr Bld: 6 % — ABNORMAL HIGH (ref <5.7)
MEAN PLASMA GLUCOSE: 126 mg/dL — AB (ref <117)

## 2014-07-23 LAB — BASIC METABOLIC PANEL WITH GFR
BUN: 8 mg/dL (ref 6–23)
CHLORIDE: 103 meq/L (ref 96–112)
CO2: 26 meq/L (ref 19–32)
Calcium: 10.1 mg/dL (ref 8.4–10.5)
Creat: 0.81 mg/dL (ref 0.50–1.35)
GFR, Est African American: >89 mL/min
GFR, Est Non African American: >89 mL/min
Glucose, Bld: 108 mg/dL — ABNORMAL HIGH (ref 70–99)
POTASSIUM: 4.3 meq/L (ref 3.5–5.3)
Sodium: 138 mEq/L (ref 135–145)

## 2014-07-23 LAB — HEPATIC FUNCTION PANEL
ALBUMIN: 4.3 g/dL (ref 3.5–5.2)
ALT: 14 U/L (ref 0–53)
AST: 15 U/L (ref 0–37)
Alkaline Phosphatase: 86 U/L (ref 39–117)
BILIRUBIN DIRECT: 0.1 mg/dL (ref 0.0–0.3)
BILIRUBIN INDIRECT: 0.4 mg/dL (ref 0.2–1.2)
BILIRUBIN TOTAL: 0.5 mg/dL (ref 0.2–1.2)
Total Protein: 6.9 g/dL (ref 6.0–8.3)

## 2014-07-23 LAB — LIPID PANEL
Cholesterol: 179 mg/dL (ref 0–200)
HDL: 35 mg/dL — AB (ref >=40)
LDL Cholesterol: 101 mg/dL — ABNORMAL HIGH (ref 0–99)
Total CHOL/HDL Ratio: 5.1 Ratio
Triglycerides: 213 mg/dL — ABNORMAL HIGH (ref <150)
VLDL: 43 mg/dL — ABNORMAL HIGH (ref 0–40)

## 2014-07-23 LAB — CBC WITH DIFFERENTIAL/PLATELET
BASOS PCT: 1 % (ref 0–1)
Basophils Absolute: 0.1 10*3/uL (ref 0.0–0.1)
Eosinophils Absolute: 0.1 10*3/uL (ref 0.0–0.7)
Eosinophils Relative: 2 % (ref 0–5)
HEMATOCRIT: 45.4 % (ref 39.0–52.0)
Hemoglobin: 15.2 g/dL (ref 13.0–17.0)
LYMPHS ABS: 1.6 10*3/uL (ref 0.7–4.0)
Lymphocytes Relative: 25 % (ref 12–46)
MCH: 32.3 pg (ref 26.0–34.0)
MCHC: 33.5 g/dL (ref 30.0–36.0)
MCV: 96.4 fL (ref 78.0–100.0)
MONOS PCT: 6 % (ref 3–12)
MPV: 10.1 fL (ref 8.6–12.4)
Monocytes Absolute: 0.4 10*3/uL (ref 0.1–1.0)
NEUTROS ABS: 4.2 10*3/uL (ref 1.7–7.7)
Neutrophils Relative %: 66 % (ref 43–77)
PLATELETS: 304 10*3/uL (ref 150–400)
RBC: 4.71 MIL/uL (ref 4.22–5.81)
RDW: 13.6 % (ref 11.5–15.5)
WBC: 6.3 10*3/uL (ref 4.0–10.5)

## 2014-07-23 LAB — TSH: TSH: 0.349 u[IU]/mL — ABNORMAL LOW (ref 0.350–4.500)

## 2014-07-23 MED ORDER — METOCLOPRAMIDE HCL 5 MG PO TABS: 5.0000 mg | ORAL_TABLET | Freq: Three times a day (TID) | ORAL | Status: DC

## 2014-07-23 NOTE — Progress Notes (Signed)
Assessment and Plan:  1. Hypertension -Continue medication, monitor blood pressure at home. Continue DASH diet.  Reminder to go to the ER if any CP, SOB, nausea, dizziness, severe HA, changes vision/speech, left arm numbness and tingling and jaw pain.  2. Cholesterol -Continue diet and exercise. Check cholesterol.   3. Diabetes without complications -Continue diet and exercise. Check A1C  4. Vitamin D Def - check level and continue medications.   5. Smoking cessation-   instruction/counseling given, counseled patient on the dangers of tobacco use, advised patient to stop smoking, and reviewed strategies to maximize success, patient not ready to quit at this time.   6. GERD/vomiting Likely gastroparesis, will add reglan, however with history will send to Dr. Deatra Ina for EGD to rule out Hyplori/ulcers/CA  Continue diet and meds as discussed. Further disposition pending results of labs. Discussed med's effects and SE's.   Over 30 minutes of exam, counseling, chart review, and critical decision making was performed   HPI 69 y.o. male  presents for 3 month follow up on hypertension, cholesterol, diabetes and vitamin D deficiency.   His blood pressure has been controlled at home, today his BP is BP: 132/70 mmHg.  He does workout. He denies chest pain, shortness of breath, dizziness.  He is on cholesterol medication, lipitor 40mg  QD and denies myalgias. His cholesterol is not at goal, less than 70. The cholesterol was:   Lab Results  Component Value Date   CHOL 180 04/13/2014   HDL 45 04/13/2014   LDLCALC 96 04/13/2014   TRIG 197* 04/13/2014   CHOLHDL 4.0 04/13/2014    He has been working on diet and exercise for diabetes without complications, he is on bASA, he is on ACE/ARB, he does check his sugars, running 110's and denies  paresthesia of the feet, polydipsia, polyuria and visual disturbances. Last A1C was:  Lab Results  Component Value Date   HGBA1C 6.4* 04/13/2014    Patient is  on Vitamin D supplement. Lab Results  Component Value Date   VD25OH 4 04/13/2014   He states that for the past month, about an hour after he eats he starts to burp, will occ spit up undigested foods, gets heart burn, and has vomited 2-3 x a week. He is on protonix 40mg . Denies alcohol use, NSAIDS. He is a current every day smoker x 25 years but smokes very little. No weight loss.  Had colonoscopy in 2009 with Dr. Deatra Ina.   Wt Readings from Last 3 Encounters:  07/23/14 153 lb (69.4 kg)  04/13/14 150 lb 6.4 oz (68.221 kg)  03/10/14 150 lb (68.04 kg)     Current Medications:  Current Outpatient Prescriptions on File Prior to Visit  Medication Sig Dispense Refill  . albuterol (VENTOLIN HFA) 108 (90 BASE) MCG/ACT inhaler Inhale 2 puffs into the lungs every 4 (four) hours as needed for wheezing or shortness of breath. 1 Inhaler 1  . aspirin 81 MG tablet Take 81 mg by mouth daily.    Marland Kitchen atorvastatin (LIPITOR) 40 MG tablet Take 1 tablet (40 mg total) by mouth daily. 90 tablet 2  . Cholecalciferol (VITAMIN D) 2000 UNITS CAPS Take 1 capsule by mouth daily.    Marland Kitchen diltiazem (DILACOR XR) 240 MG 24 hr capsule Take 1 tablet daily for BP & heart beat 90 capsule 3  . HYDROcodone-acetaminophen (NORCO) 5-325 MG per tablet Take 1 tablet by mouth every 6 (six) hours as needed for moderate pain. 15 tablet 0  . losartan (COZAAR) 100  MG tablet Take 1 tablet daily for BP 90 tablet 1  . magnesium gluconate (MAGONATE) 500 MG tablet Take 500 mg by mouth 2 (two) times daily.    . metFORMIN (GLUCOPHAGE-XR) 500 MG 24 hr tablet Take 1 tablet (500 mg total) by mouth 2 (two) times daily. 180 tablet 3  . pantoprazole (PROTONIX) 40 MG tablet Take 1 tablet (40 mg total) by mouth daily. 90 tablet 2   No current facility-administered medications on file prior to visit.   Medical History:  Past Medical History  Diagnosis Date  . Vitamin D deficiency   . Diabetes mellitus without complication   . Hypertension   .  Hyperlipidemia    Allergies:  Allergies  Allergen Reactions  . Ppd [Tuberculin Purified Protein Derivative]     Positive PPD.     Review of Systems:  Review of Systems  Constitutional: Positive for malaise/fatigue. Negative for fever, chills, weight loss and diaphoresis.  HENT: Negative.   Eyes: Negative.   Respiratory: Negative.   Cardiovascular: Negative.   Gastrointestinal: Positive for heartburn, nausea and vomiting. Negative for abdominal pain, diarrhea, constipation, blood in stool and melena.  Genitourinary: Negative.   Musculoskeletal: Negative.   Skin: Negative.   Neurological: Negative.  Negative for weakness.  Endo/Heme/Allergies: Negative.   Psychiatric/Behavioral: Negative.     Family history- Review and unchanged Social history- Review and unchanged Physical Exam: BP 132/70 mmHg  Pulse 72  Temp(Src) 97.9 F (36.6 C)  Resp 16  Ht 5\' 10"  (1.778 m)  Wt 153 lb (69.4 kg)  BMI 21.95 kg/m2 Wt Readings from Last 3 Encounters:  07/23/14 153 lb (69.4 kg)  04/13/14 150 lb 6.4 oz (68.221 kg)  03/10/14 150 lb (68.04 kg)   General Appearance: Well nourished, in no apparent distress. Eyes: PERRLA, EOMs, conjunctiva no swelling or erythema Sinuses: No Frontal/maxillary tenderness ENT/Mouth: Ext aud canals clear, TMs without erythema, bulging. No erythema, swelling, or exudate on post pharynx.  Tonsils not swollen or erythematous. Hearing normal.  Neck: Supple, thyroid normal.  Respiratory: Respiratory effort normal, BS equal bilaterally without rales, rhonchi, wheezing or stridor.  Cardio: RRR with no MRGs. Brisk peripheral pulses without edema.  Abdomen: Soft, + BS.  Non tender, + epigastric guarding, without rebound, hernias, masses. Lymphatics: Non tender without lymphadenopathy.  Musculoskeletal: Full ROM, 5/5 strength, Normal gait Skin: Warm, dry without rashes, lesions, ecchymosis.  Neuro: Cranial nerves intact. No cerebellar symptoms.  Psych: Awake and  oriented X 3, normal affect, Insight and Judgment appropriate.    Vicie Mutters, PA-C 8:40 AM [facility]

## 2014-07-23 NOTE — Patient Instructions (Addendum)
Please call Dr. Deatra Ina Phone: [phone]  Gastroparesis  Gastroparesis is also called slowed stomach emptying (delayed gastric emptying). It is a condition in which the stomach takes too long to empty its contents. It often happens in people with diabetes.  CAUSES  Gastroparesis happens when nerves to the stomach are damaged or stop working. When the nerves are damaged, the muscles of the stomach and intestines do not work normally. The movement of food is slowed or stopped. High blood glucose (sugar) causes changes in nerves and can damage the blood vessels that carry oxygen and nutrients to the nerves. RISK FACTORS  Diabetes.  Post-viral syndromes.  Eating disorders (anorexia, bulimia).  Surgery on the stomach or vagus nerve.  Gastroesophageal reflux disease (rarely).  Smooth muscle disorders (amyloidosis, scleroderma).  Metabolic disorders, including hypothyroidism.  Parkinson disease. SYMPTOMS   Heartburn.  Feeling sick to your stomach (nausea).  Vomiting of undigested food.  An early feeling of fullness when eating.  Weight loss.  Abdominal bloating.  Erratic blood glucose levels.  Lack of appetite.  Gastroesophageal reflux.  Spasms of the stomach wall. Complications can include:  Bacterial overgrowth in stomach. Food stays in the stomach and can ferment and cause bacteria to grow.  Weight loss due to difficulty digesting and absorbing nutrients.  Vomiting.  Obstruction in the stomach. Undigested food can harden and cause nausea and vomiting.  Blood glucose fluctuations caused by inconsistent food absorption. DIAGNOSIS  The diagnosis of gastroparesis is confirmed through one or more of the following tests:  Barium X-rays and scans. These tests look at how long it takes for food to move through the stomach.  Gastric manometry. This test measures electrical and muscular activity in the stomach. A thin tube is passed down the throat into the stomach.  The tube contains a wire that takes measurements of the stomach's electrical and muscular activity as it digests liquids and solid food.  Endoscopy. This procedure is done with a long, thin tube called an endoscope. It is passed through the mouth and gently down the esophagus into the stomach. This tube helps the caregiver look at the lining of the stomach to check for any abnormalities.  Ultrasonography. This can rule out gallbladder disease or pancreatitis. This test will outline and define the shape of the gallbladder and pancreas. TREATMENT   Treatments may include:  Exercise.  Medicines to control nausea and vomiting.  Medicines to stimulate stomach muscles.  Changes in what and when you eat.  Having smaller meals more often.  Eating low-fiber forms of high-fiber foods, such as eating cooked vegetables instead of raw vegetables.  Eating low-fat foods.  Consuming liquids, which are easier to digest.  In severe cases, feeding tubes and intravenous (IV) feeding may be needed. It is important to note that in most cases, treatment does not cure gastroparesis. It is usually a lasting (chronic) condition. Treatment helps you manage the underlying condition so that you can be as healthy and comfortable as possible. Other treatments  A gastric neurostimulator has been developed to assist people with gastroparesis. The battery-operated device is surgically implanted. It emits mild electrical pulses to help improve stomach emptying and to control nausea and vomiting.  The use of botulinum toxin has been shown to improve stomach emptying by decreasing the prolonged contractions of the muscle between the stomach and the small intestine (pyloric sphincter). The benefits are temporary. SEEK MEDICAL CARE IF:   You have diabetes and you are having problems keeping your blood glucose in  goal range.  You are having nausea, vomiting, bloating, or early feelings of fullness with eating.  Your  symptoms do not change with a change in diet. Document Released: 03/12/2005 Document Revised: 07/07/2012 Document Reviewed: 08/19/2008 [facility] Patient Information 2015 [place], [place]. This information is not intended to replace advice given to you by your health care provider. Make sure you discuss any questions you have with your health care provider.  Diabetes is a very complicated disease...lets simplify it.  An easy way to look at it to understand the complications is if you think of the extra sugar floating in your blood stream as glass shards floating through your blood stream.    Diabetes affects your small vessels first: 1) The glass shards (sugar) scraps down the tiny blood vessels in your eyes and lead to diabetic retinopathy, the leading cause of blindness in the [place]. Diabetes is the leading cause of newly diagnosed adult (32 to 70 years of age) blindness in the [place].  2) The glass shards scratches down the tiny vessels of your legs leading to nerve damage called neuropathy and can lead to amputations of your feet. More than 60% of all non-traumatic amputations of lower limbs occur in people with diabetes.  3) Over time the small vessels in your brain are shredded and closed off, individually this does not cause any problems but over a long period of time many of the small vessels being blocked can lead to Vascular Dementia.   4) Your kidney's are a filter system and have a "net" that keeps certain things in the body and lets bad things out. Sugar shreds this net and leads to kidney damage and eventually failure. Decreasing the sugar that is destroying the net and certain blood pressure medications can help stop or decrease progression of kidney disease. Diabetes was the primary cause of kidney failure in 44 percent of all new cases in 2011.  5) Diabetes also destroys the small vessels in your penis that lead to erectile dysfunction. Eventually the vessels are so damaged that  you may not be responsive to cialis or viagra.   Diabetes and your large vessels: Your larger vessels consist of your coronary arteries in your heart and the carotid vessels to your brain. Diabetes or even increased sugars put you at 300% increased risk of heart attack and stroke and this is why.. The sugar scrapes down your large blood vessels and your body sees this as an internal injury and tries to repair itself. Just like you get a scab on your skin, your platelets will stick to the blood vessel wall trying to heal it. This is why we have diabetics on low dose aspirin daily, this prevents the platelets from sticking and can prevent plaque formation. In addition, your body takes cholesterol and tries to shove it into the open wound. This is why we want your LDL, or bad cholesterol, below 70.   The combination of platelets and cholesterol over 5-10 years forms plaque that can break off and cause a heart attack or stroke.   PLEASE REMEMBER:  Diabetes is preventable! Up to 87 percent of complications and morbidities among individuals with type 2 diabetes can be prevented, delayed, or effectively treated and minimized with regular visits to a health professional, appropriate monitoring and medication, and a healthy diet and lifestyle.  Recommendations For Diabetic/Prediabetic Patients:   -  Take medications as prescribed  -  Recommend Dr Fara Olden Fuhrman's book "The End of Diabetes "  And "  The End of Dieting"- Can get at  [email or web address].com and encourage also get the Audio CD book  - AVOID Animal products, ie. Meat - red/white, Poultry and Dairy/especially cheese - Exercise at least 5 times a week for 30 minutes or preferably daily.  - No Smoking - Drink less than 2 drinks a day.  - Monitor your feet for sores - Have yearly Eye Exams - Recommend annual Flu vaccine  - Recommend Pneumovax and Prevnar vaccines - Shingles Vaccine (Zostavax) if over 49 y.o.  Goals:   - BMI less than 24 - Fasting sugar  less than 130 or less than 150 if tapering medicines to lose weight  - Systolic BP less than 017  - Diastolic BP less than 80 - Bad LDL Cholesterol less than 70 - Triglycerides less than 150  .[email or web address]

## 2014-07-24 LAB — VITAMIN D 25 HYDROXY (VIT D DEFICIENCY, FRACTURES): Vit D, 25-Hydroxy: 33 ng/mL (ref 30–100)

## 2014-07-24 NOTE — Addendum Note (Signed)
Addended by: Vicie Mutters R on: 07/24/2014 08:00 AM   Modules accepted: Orders

## 2014-08-16 ENCOUNTER — Telehealth: Payer: Self-pay | Admitting: Internal Medicine

## 2014-08-16 DIAGNOSIS — K2100 Gastro-esophageal reflux disease with esophagitis, without bleeding: Secondary | ICD-10-CM

## 2014-08-16 NOTE — Telephone Encounter (Signed)
Patsy called and said patient needed a referral for Jimmy to have procedure with Dr Deatra Ina to do the procedure, "where the tube goes down his throat".   She tried to schedule, but [facility] said he needs a referral.  Please advise  Thank you, Leonie Douglas Referral Coordinator  [facility] [phone] ext. [phone] Fax [phone]

## 2014-08-17 ENCOUNTER — Encounter: Payer: Self-pay | Admitting: Gastroenterology

## 2014-09-01 ENCOUNTER — Ambulatory Visit (HOSPITAL_COMMUNITY)
Admission: RE | Admit: 2014-09-01 | Discharge: 2014-09-01 | Disposition: A | Discharge status: Home / Self Care | Payer: Commercial Managed Care - HMO | Source: Ambulatory Visit | Attending: Physician Assistant | Admitting: Physician Assistant

## 2014-09-01 DIAGNOSIS — E0590 Thyrotoxicosis, unspecified without thyrotoxic crisis or storm: Secondary | ICD-10-CM | POA: Insufficient documentation

## 2014-09-02 ENCOUNTER — Encounter (HOSPITAL_COMMUNITY)
Admission: RE | Admit: 2014-09-02 | Discharge: 2014-09-02 | Disposition: A | Discharge status: Home / Self Care | Payer: Commercial Managed Care - HMO | Source: Ambulatory Visit | Attending: Physician Assistant | Admitting: Physician Assistant

## 2014-09-02 DIAGNOSIS — E0590 Thyrotoxicosis, unspecified without thyrotoxic crisis or storm: Secondary | ICD-10-CM | POA: Diagnosis not present

## 2014-09-02 MED ORDER — SODIUM PERTECHNETATE TC 99M INJECTION
9.6000 | Freq: Once | INTRAVENOUS | Status: AC | PRN
Start: 2014-09-02 — End: 2014-09-02
  Administered 2014-09-02: 10 via INTRAVENOUS

## 2014-09-02 MED ORDER — SODIUM IODIDE I 131 CAPSULE
12.0000 | Freq: Once | INTRAVENOUS | Status: AC | PRN
  Administered 2014-09-02: 12 via ORAL

## 2014-09-28 ENCOUNTER — Other Ambulatory Visit: Payer: Self-pay | Admitting: *Deleted

## 2014-09-28 DIAGNOSIS — K3184 Gastroparesis: Secondary | ICD-10-CM

## 2014-09-28 MED ORDER — LOSARTAN POTASSIUM 100 MG PO TABS: ORAL_TABLET | ORAL | Status: DC

## 2014-09-28 MED ORDER — METOCLOPRAMIDE HCL 5 MG PO TABS: 5.0000 mg | ORAL_TABLET | Freq: Three times a day (TID) | ORAL | Status: DC

## 2014-10-26 ENCOUNTER — Ambulatory Visit: Payer: Medicare PPO | Admitting: Gastroenterology

## 2014-10-27 ENCOUNTER — Ambulatory Visit: Payer: Self-pay | Admitting: Internal Medicine

## 2014-12-18 ENCOUNTER — Other Ambulatory Visit: Payer: Self-pay | Admitting: Internal Medicine

## 2014-12-18 DIAGNOSIS — K219 Gastro-esophageal reflux disease without esophagitis: Secondary | ICD-10-CM

## 2014-12-25 ENCOUNTER — Other Ambulatory Visit: Payer: Self-pay | Admitting: Internal Medicine

## 2014-12-28 ENCOUNTER — Other Ambulatory Visit: Payer: Self-pay

## 2014-12-28 MED ORDER — ATORVASTATIN CALCIUM 40 MG PO TABS: 40.0000 mg | ORAL_TABLET | Freq: Every day | ORAL | Status: DC

## 2014-12-28 MED ORDER — DILTIAZEM HCL ER 240 MG PO CP24: ORAL_CAPSULE | ORAL | Status: DC

## 2014-12-28 MED ORDER — METFORMIN HCL ER 500 MG PO TB24: 500.0000 mg | ORAL_TABLET | Freq: Two times a day (BID) | ORAL | Status: DC

## 2015-01-17 ENCOUNTER — Ambulatory Visit (INDEPENDENT_AMBULATORY_CARE_PROVIDER_SITE_OTHER): Payer: Commercial Managed Care - HMO | Admitting: Internal Medicine

## 2015-01-17 ENCOUNTER — Encounter: Payer: Self-pay | Admitting: Internal Medicine

## 2015-01-17 VITALS — BP 130/62 | HR 74 | Temp 98.0°F | Resp 16 | Ht 70.0 in | Wt 154.0 lb

## 2015-01-17 DIAGNOSIS — F17200 Nicotine dependence, unspecified, uncomplicated: Secondary | ICD-10-CM

## 2015-01-17 DIAGNOSIS — E782 Mixed hyperlipidemia: Secondary | ICD-10-CM

## 2015-01-17 DIAGNOSIS — Z23 Encounter for immunization: Secondary | ICD-10-CM

## 2015-01-17 DIAGNOSIS — Z79899 Other long term (current) drug therapy: Secondary | ICD-10-CM

## 2015-01-17 DIAGNOSIS — E559 Vitamin D deficiency, unspecified: Secondary | ICD-10-CM

## 2015-01-17 DIAGNOSIS — E119 Type 2 diabetes mellitus without complications: Secondary | ICD-10-CM

## 2015-01-17 DIAGNOSIS — I10 Essential (primary) hypertension: Secondary | ICD-10-CM | POA: Diagnosis not present

## 2015-01-17 LAB — CBC WITH DIFFERENTIAL/PLATELET
Basophils Absolute: 0.1 10*3/uL (ref 0.0–0.1)
Basophils Relative: 1 % (ref 0–1)
EOS ABS: 0.1 10*3/uL (ref 0.0–0.7)
Eosinophils Relative: 2 % (ref 0–5)
HCT: 43.4 % (ref 39.0–52.0)
Hemoglobin: 14.8 g/dL (ref 13.0–17.0)
LYMPHS ABS: 2 10*3/uL (ref 0.7–4.0)
Lymphocytes Relative: 28 % (ref 12–46)
MCH: 32.5 pg (ref 26.0–34.0)
MCHC: 34.1 g/dL (ref 30.0–36.0)
MCV: 95.4 fL (ref 78.0–100.0)
MONOS PCT: 8 % (ref 3–12)
MPV: 10.4 fL (ref 8.6–12.4)
Monocytes Absolute: 0.6 10*3/uL (ref 0.1–1.0)
NEUTROS PCT: 61 % (ref 43–77)
Neutro Abs: 4.5 10*3/uL (ref 1.7–7.7)
PLATELETS: 273 10*3/uL (ref 150–400)
RBC: 4.55 MIL/uL (ref 4.22–5.81)
RDW: 13.1 % (ref 11.5–15.5)
WBC: 7.3 10*3/uL (ref 4.0–10.5)

## 2015-01-17 LAB — LIPID PANEL
Cholesterol: 166 mg/dL (ref 125–200)
HDL: 28 mg/dL — ABNORMAL LOW (ref >=40)
LDL CALC: 86 mg/dL (ref <130)
TRIGLYCERIDES: 262 mg/dL — AB (ref <150)
Total CHOL/HDL Ratio: 5.9 Ratio — ABNORMAL HIGH (ref <=5.0)
VLDL: 52 mg/dL — ABNORMAL HIGH (ref <30)

## 2015-01-17 LAB — HEPATIC FUNCTION PANEL
ALT: 12 U/L (ref 9–46)
AST: 14 U/L (ref 10–35)
Albumin: 4.3 g/dL (ref 3.6–5.1)
Alkaline Phosphatase: 94 U/L (ref 40–115)
BILIRUBIN DIRECT: 0.1 mg/dL (ref <=0.2)
BILIRUBIN TOTAL: 0.4 mg/dL (ref 0.2–1.2)
Indirect Bilirubin: 0.3 mg/dL (ref 0.2–1.2)
Total Protein: 6.8 g/dL (ref 6.1–8.1)

## 2015-01-17 LAB — BASIC METABOLIC PANEL WITH GFR
BUN: 11 mg/dL (ref 7–25)
CALCIUM: 9.5 mg/dL (ref 8.6–10.3)
CO2: 27 mmol/L (ref 20–31)
CREATININE: 0.94 mg/dL (ref 0.70–1.25)
Chloride: 86 mmol/L — ABNORMAL LOW (ref 98–110)
GFR, Est Non African American: 82 mL/min (ref >=60)
Glucose, Bld: 78 mg/dL (ref 65–99)
Potassium: 3.3 mmol/L — ABNORMAL LOW (ref 3.5–5.3)
SODIUM: 138 mmol/L (ref 135–146)

## 2015-01-17 NOTE — Progress Notes (Signed)
Patient ID: Jesus Bush, male   DOB: 17-May-1945, 69 y.o.   MRN: [id number]  Assessment and Plan:  Hypertension:  -Continue medication -monitor blood pressure at home. -Continue DASH diet -Reminder to go to the ER if any CP, SOB, nausea, dizziness, severe HA, changes vision/speech, left arm numbness and tingling and jaw pain.  Cholesterol - Continue diet and exercise -Check cholesterol.   Diabetes without complications -Continue diet and exercise.  -Check A1C  Vitamin D Def -check level -continue medications.   Continue diet and meds as discussed. Further disposition pending results of labs. Discussed med's effects and SE's.    HPI 69 y.o. male  presents for 3 month follow up with hypertension, hyperlipidemia, diabetes and vitamin D deficiency.   His blood pressure has been controlled at home, today their BP is BP: 130/62 mmHg.He does workout. He denies chest pain, shortness of breath, dizziness.   He is on cholesterol medication and denies myalgias. His cholesterol is at goal. The cholesterol was:  07/23/2014: Cholesterol 179; HDL 35*; LDL Cholesterol 101*; Triglycerides 213*   He has been working on diet and exercise for diabetes without complications, he is on bASA, he is on ACE/ARB, and denies  foot ulcerations, hyperglycemia, hypoglycemia , increased appetite, nausea, paresthesia of the feet, polydipsia, polyuria, visual disturbances, vomiting and weight loss. Last A1C was: 07/23/2014: Hgb A1c MFr Bld 6.0*   Patient is on Vitamin D supplement. 07/23/2014: Vit D, 25-Hydroxy 33  Patient reports that he is doing very well concerning his gastroparesis.  He has no vomiting or nausea after eating.  He reports that he will still occasionally have some heart burn.  He will occasionally just increase his protonix.    Current Medications:  Current Outpatient Prescriptions on File Prior to Visit  Medication Sig Dispense Refill  . albuterol (VENTOLIN HFA) 108 (90 BASE) MCG/ACT inhaler  Inhale 2 puffs into the lungs every 4 (four) hours as needed for wheezing or shortness of breath. 1 Inhaler 1  . aspirin 81 MG tablet Take 81 mg by mouth daily.    Marland Kitchen atorvastatin (LIPITOR) 40 MG tablet Take 1 tablet (40 mg total) by mouth daily. 30 tablet 0  . Cholecalciferol (VITAMIN D) 2000 UNITS CAPS Take 1 capsule by mouth daily.    Marland Kitchen diltiazem (DILACOR XR) 240 MG 24 hr capsule Take 1 tablet daily for BP & heart beat 90 capsule 3  . HYDROcodone-acetaminophen (NORCO) 5-325 MG per tablet Take 1 tablet by mouth every 6 (six) hours as needed for moderate pain. 15 tablet 0  . losartan (COZAAR) 100 MG tablet TAKE 1 TABLET DAILY FOR BP 90 tablet 1  . magnesium gluconate (MAGONATE) 500 MG tablet Take 500 mg by mouth 2 (two) times daily.    . metFORMIN (GLUCOPHAGE-XR) 500 MG 24 hr tablet Take 1 tablet (500 mg total) by mouth 2 (two) times daily. 180 tablet 3  . metoCLOPramide (REGLAN) 5 MG tablet Take 1 tablet (5 mg total) by mouth 3 (three) times daily. 270 tablet 1  . pantoprazole (PROTONIX) 40 MG tablet TAKE 1 TABLET EVERY DAY 90 tablet 1   No current facility-administered medications on file prior to visit.   Medical History:  Past Medical History  Diagnosis Date  . Vitamin D deficiency   . Diabetes mellitus without complication ([place])   . Hypertension   . Hyperlipidemia    Allergies:  Allergies  Allergen Reactions  . Ppd [Tuberculin Purified Protein Derivative]     Positive PPD.  Review of Systems:  Review of Systems  Constitutional: Negative for fever, chills and malaise/fatigue.  HENT: Negative for congestion, sore throat and tinnitus.   Respiratory: Negative for cough, shortness of breath and wheezing.   Cardiovascular: Negative for chest pain, palpitations and leg swelling.  Gastrointestinal: Positive for heartburn. Negative for abdominal pain, diarrhea, constipation and blood in stool.  Genitourinary: Negative.   Skin: Negative.   Neurological: Negative for dizziness,  sensory change, loss of consciousness and headaches.  Psychiatric/Behavioral: Negative for depression. The patient is not nervous/anxious and does not have insomnia.     Family history- Review and unchanged  Social history- Review and unchanged  Physical Exam: BP 130/62 mmHg  Pulse 74  Temp(Src) 98 F (36.7 C) (Temporal)  Resp 16  Ht 5\' 10"  (1.778 m)  Wt 154 lb (69.854 kg)  BMI 22.10 kg/m2 Wt Readings from Last 3 Encounters:  01/17/15 154 lb (69.854 kg)  07/23/14 153 lb (69.4 kg)  04/13/14 150 lb 6.4 oz (68.221 kg)   General Appearance: Well nourished well developed, non-toxic appearing, in no apparent distress. Eyes: PERRLA, EOMs, conjunctiva no swelling or erythema ENT/Mouth: Ear canals clear with no erythema, swelling, or discharge.  TMs normal bilaterally, oropharynx clear, moist, with no exudate.   Neck: Supple, thyroid normal, no JVD, no cervical adenopathy.  Respiratory: Respiratory effort normal, breath sounds clear A&P, no wheeze, rhonchi or rales noted.  No retractions, no accessory muscle usage Cardio: RRR with no MRGs. No noted edema.  Abdomen: Soft, + BS.  Non tender, no guarding, rebound, hernias, masses. Musculoskeletal: Full ROM, 5/5 strength, Normal gait Skin: Warm, dry without rashes, lesions, ecchymosis.  Neuro: Awake and oriented X 3, Cranial nerves intact. No cerebellar symptoms.  Psych: normal affect, Insight and Judgment appropriate.    Starlyn Skeans, PA-C 2:32 PM [facility]

## 2015-01-17 NOTE — Addendum Note (Signed)
Addended by: Merleen Picazo A on: 01/17/2015 02:47 PM   Modules accepted: Orders

## 2015-01-18 LAB — TSH: TSH: 0.444 u[IU]/mL (ref 0.350–4.500)

## 2015-01-18 LAB — HEMOGLOBIN A1C
HEMOGLOBIN A1C: 5.9 % — AB (ref <5.7)
Mean Plasma Glucose: 123 mg/dL — ABNORMAL HIGH (ref <117)

## 2015-03-28 ENCOUNTER — Other Ambulatory Visit: Payer: Self-pay | Admitting: Internal Medicine

## 2015-04-25 ENCOUNTER — Encounter: Payer: Self-pay | Admitting: Internal Medicine

## 2015-04-25 ENCOUNTER — Ambulatory Visit (INDEPENDENT_AMBULATORY_CARE_PROVIDER_SITE_OTHER): Payer: Commercial Managed Care - HMO | Admitting: Internal Medicine

## 2015-04-25 VITALS — BP 142/78 | HR 60 | Temp 97.5°F | Resp 16 | Ht 70.0 in | Wt 152.6 lb

## 2015-04-25 DIAGNOSIS — F17200 Nicotine dependence, unspecified, uncomplicated: Secondary | ICD-10-CM

## 2015-04-25 DIAGNOSIS — E119 Type 2 diabetes mellitus without complications: Secondary | ICD-10-CM

## 2015-04-25 DIAGNOSIS — Z1212 Encounter for screening for malignant neoplasm of rectum: Secondary | ICD-10-CM

## 2015-04-25 DIAGNOSIS — Z0001 Encounter for general adult medical examination with abnormal findings: Secondary | ICD-10-CM

## 2015-04-25 DIAGNOSIS — Z125 Encounter for screening for malignant neoplasm of prostate: Secondary | ICD-10-CM | POA: Diagnosis not present

## 2015-04-25 DIAGNOSIS — Z0000 Encounter for general adult medical examination without abnormal findings: Secondary | ICD-10-CM

## 2015-04-25 DIAGNOSIS — I10 Essential (primary) hypertension: Secondary | ICD-10-CM

## 2015-04-25 DIAGNOSIS — E782 Mixed hyperlipidemia: Secondary | ICD-10-CM

## 2015-04-25 DIAGNOSIS — K219 Gastro-esophageal reflux disease without esophagitis: Secondary | ICD-10-CM | POA: Insufficient documentation

## 2015-04-25 DIAGNOSIS — Z79899 Other long term (current) drug therapy: Secondary | ICD-10-CM

## 2015-04-25 DIAGNOSIS — E559 Vitamin D deficiency, unspecified: Secondary | ICD-10-CM

## 2015-04-25 DIAGNOSIS — Z1331 Encounter for screening for depression: Secondary | ICD-10-CM

## 2015-04-25 LAB — URINALYSIS, ROUTINE W REFLEX MICROSCOPIC
BILIRUBIN URINE: NEGATIVE
Glucose, UA: NEGATIVE
Hgb urine dipstick: NEGATIVE
Ketones, ur: NEGATIVE
Leukocytes, UA: NEGATIVE
Nitrite: NEGATIVE
PH: 6.5 (ref 5.0–8.0)
Protein, ur: NEGATIVE
SPECIFIC GRAVITY, URINE: 1.018 (ref 1.001–1.035)

## 2015-04-25 LAB — BASIC METABOLIC PANEL WITH GFR
BUN: 10 mg/dL (ref 7–25)
CALCIUM: 9.8 mg/dL (ref 8.6–10.3)
CO2: 28 mmol/L (ref 20–31)
CREATININE: 0.81 mg/dL (ref 0.70–1.18)
Chloride: 102 mmol/L (ref 98–110)
GLUCOSE: 96 mg/dL (ref 65–99)
Potassium: 4.6 mmol/L (ref 3.5–5.3)
SODIUM: 138 mmol/L (ref 135–146)

## 2015-04-25 LAB — CBC WITH DIFFERENTIAL/PLATELET
BASOS PCT: 1 % (ref 0–1)
Basophils Absolute: 0.1 10*3/uL (ref 0.0–0.1)
Eosinophils Absolute: 0.1 10*3/uL (ref 0.0–0.7)
Eosinophils Relative: 1 % (ref 0–5)
HCT: 44.2 % (ref 39.0–52.0)
HEMOGLOBIN: 14.7 g/dL (ref 13.0–17.0)
LYMPHS PCT: 18 % (ref 12–46)
Lymphs Abs: 1.8 10*3/uL (ref 0.7–4.0)
MCH: 32.4 pg (ref 26.0–34.0)
MCHC: 33.3 g/dL (ref 30.0–36.0)
MCV: 97.4 fL (ref 78.0–100.0)
MPV: 10.5 fL (ref 8.6–12.4)
Monocytes Absolute: 0.8 10*3/uL (ref 0.1–1.0)
Monocytes Relative: 8 % (ref 3–12)
NEUTROS ABS: 7.3 10*3/uL (ref 1.7–7.7)
NEUTROS PCT: 72 % (ref 43–77)
Platelets: 288 10*3/uL (ref 150–400)
RBC: 4.54 MIL/uL (ref 4.22–5.81)
RDW: 14 % (ref 11.5–15.5)
WBC: 10.2 10*3/uL (ref 4.0–10.5)

## 2015-04-25 LAB — HEPATIC FUNCTION PANEL
ALT: 12 U/L (ref 9–46)
AST: 12 U/L (ref 10–35)
Albumin: 4.3 g/dL (ref 3.6–5.1)
Alkaline Phosphatase: 106 U/L (ref 40–115)
BILIRUBIN DIRECT: 0.1 mg/dL (ref <=0.2)
BILIRUBIN INDIRECT: 0.5 mg/dL (ref 0.2–1.2)
TOTAL PROTEIN: 6.9 g/dL (ref 6.1–8.1)
Total Bilirubin: 0.6 mg/dL (ref 0.2–1.2)

## 2015-04-25 LAB — LIPID PANEL
CHOLESTEROL: 173 mg/dL (ref 125–200)
HDL: 34 mg/dL — ABNORMAL LOW (ref >=40)
LDL Cholesterol: 104 mg/dL (ref <130)
TRIGLYCERIDES: 173 mg/dL — AB (ref <150)
Total CHOL/HDL Ratio: 5.1 Ratio — ABNORMAL HIGH (ref <=5.0)
VLDL: 35 mg/dL — AB (ref <30)

## 2015-04-25 LAB — HEMOGLOBIN A1C
Hgb A1c MFr Bld: 5.7 % — ABNORMAL HIGH (ref <5.7)
MEAN PLASMA GLUCOSE: 117 mg/dL — AB (ref <117)

## 2015-04-25 LAB — MAGNESIUM: MAGNESIUM: 1.7 mg/dL (ref 1.5–2.5)

## 2015-04-25 MED ORDER — RANITIDINE HCL 300 MG PO TABS: ORAL_TABLET | ORAL | Status: DC

## 2015-04-25 NOTE — Progress Notes (Signed)
Patient ID: Jesus Bush, male   DOB: October 16, 1945, 70 y.o.   MRN: [id number]  Annual  Screening/Preventative Visit And Comprehensive Evaluation & Examination  This very nice 70 y.o.male presents for presents for a Wellness/Preventative Visit & comprehensive evaluation and management of multiple medical co-morbidities.  Patient has been followed for HTN, T2_NIDDM, Hyperlipidemia and Vitamin D Deficiency. Patient also has GERD not totally controlled with am Protonix and described EG to SS to (+) waterbrash.   HTN predates since 2005. Patient's BP has been controlled at home.Today's BP is 142/78. Patient denies any cardiac symptoms as chest pain, palpitations, shortness of breath, dizziness or ankle swelling.   Patient's hyperlipidemia is controlled with diet and medications. Patient denies myalgias or other medication SE's. Last lipids were at goal with Cholesterol 166; HDL 28*; LDL 86; and elevated Triglycerides 262 on 01/17/2015.   Patient has T2_NIDDM since  2010 with A1c 7.5% and patient denies reactive hypoglycemic symptoms, visual blurring, diabetic polys or paresthesias. Last A1c was  5.9% on 01/17/2015.   Finally, patient has history of Vitamin D Deficiency of of "23" in 2008  and last vitamin D was was still very low at "40" on 07/23/2014.    Medication Sig  . aspirin 81 MG tablet Take 81 mg by mouth daily.  Marland Kitchen atorvastatin  40 MG tab Take 1 tablet (40 mg total) by mouth daily.  Marland Kitchen VITAMIN D 2000 UNITS Take 1 capsule by mouth daily.  Marland Kitchen diltiazem  XR 240 MG 24 hr cap Take 1 tablet daily for BP & heart beat  . Losartan 100 MG tab TAKE 1 TABLET EVERY DAY FOR BLOOD PRESSURE  . magnesium  500 MG tab Take 500 mg by mouth 2 (two) times daily.  . metFORMIN-XR 500 MG 24 hr  Take 1 tablet (500 mg total) by mouth 2 (two) times daily.  . pantoprazole  40 MG tablet TAKE 1 TABLET EVERY DAY   Allergies  Allergen Reactions  . Ppd [Tuberculin Purified Protein Derivative]     Positive PPD.   Past  Medical History  Diagnosis Date  . Vitamin D deficiency   . Diabetes mellitus without complication ([place])   . Hypertension   . Hyperlipidemia    Health Maintenance  Topic Date Due  . FOOT EXAM  04/14/2015  . PNA vac Low Risk Adult (2 of 2 - PPSV23) 04/14/2015  . Hepatitis C Screening  01/25/2020 (Originally 08-12-1945)  . HEMOGLOBIN A1C  07/18/2015  . INFLUENZA VACCINE  10/25/2015  . OPHTHALMOLOGY EXAM  10/25/2015  . TETANUS/TDAP  05/23/2016  . COLONOSCOPY  08/14/2017  . ZOSTAVAX  Completed   Immunization History  Administered Date(s) Administered  . DTaP 05/23/2006  . Influenza Whole 12/16/2012  . Influenza, High Dose Seasonal PF 01/17/2015  . Influenza-Unspecified 12/27/2013  . Pneumococcal Conjugate-13 04/13/2014  . Pneumococcal Polysaccharide-23 09/13/2008  . Zoster 04/17/2013   Family History  Problem Relation Age of Onset  . Heart attack Mother   . Heart disease Mother   . Heart attack Brother   . Hypertension Brother   . Diabetes Sister   . Heart disease Sister   . Heart disease Father   . Heart attack Father    Social History   Social History  . Marital Status: Married    Spouse Name: N/A  . Number of Children: 2  . Years of Education: N/A   Occupational History  . retired    Social History Main Topics  . Smoking status: Current Every  Day Smoker -- 0.03 packs/day for 10 years    Types: Cigarettes  . Smokeless tobacco: Never Used     Comment: smokes 4-5 cigarettes a week and uses E-cigarette  . Alcohol Use: No     Comment: He states he no longer drinks alcohol  . Drug Use: No  . Sexual Activity: Active    ROS Constitutional: Denies fever, chills, weight loss/gain, headaches, insomnia,  night sweats or change in appetite. Does c/o fatigue. Eyes: Denies redness, blurred vision, diplopia, discharge, itchy or watery eyes.  ENT: Denies discharge, congestion, post nasal drip, epistaxis, sore throat, earache, hearing loss, dental pain, Tinnitus, Vertigo,  Sinus pain or snoring.  Cardio: Denies chest pain, palpitations, irregular heartbeat, syncope, dyspnea, diaphoresis, orthopnea, PND, claudication or edema Respiratory: denies cough, dyspnea, DOE, pleurisy, hoarseness, laryngitis or wheezing.  Gastrointestinal: Denies dysphagia, heartburn, reflux, water brash, pain, cramps, nausea, vomiting, bloating, diarrhea, constipation, hematemesis, melena, hematochezia, jaundice or hemorrhoids Genitourinary: Denies dysuria, frequency, urgency, nocturia, hesitancy, discharge, hematuria or flank pain Musculoskeletal: Denies arthralgia, myalgia, stiffness, Jt. Swelling, pain, limp or strain/sprain. Denies Falls. Skin: Denies puritis, rash, hives, warts, acne, eczema or change in skin lesion Neuro: No weakness, tremor, incoordination, spasms, paresthesia or pain Psychiatric: Denies confusion, memory loss or sensory loss. Denies Depression. Endocrine: Denies change in weight, skin, hair change, nocturia, and paresthesia, diabetic polys, visual blurring or hyper / hypo glycemic episodes.  Heme/Lymph: No excessive bleeding, bruising or enlarged lymph nodes.  Physical Exam  BP 142/78 mmHg  Pulse 60  Temp(Src) 97.5 F (36.4 C)  Resp 16  Ht 5\' 10"  (1.778 m)  Wt 152 lb 9.6 oz (69.219 kg)  BMI 21.90 kg/m2  General Appearance: Well nourished, in no apparent distress. Eyes: PERRLA, EOMs, conjunctiva no swelling or erythema, normal fundi and vessels. Sinuses: No frontal/maxillary tenderness ENT/Mouth: EACs patent / TMs  nl. Nares clear without erythema, swelling, mucoid exudates. Oral hygiene is good. No erythema, swelling, or exudate. Tongue normal, non-obstructing. Tonsils not swollen or erythematous. Hearing normal.  Neck: Supple, thyroid normal. No bruits, nodes or JVD. Respiratory: Respiratory effort normal.  BS equal and clear bilateral without rales, rhonci, wheezing or stridor. Cardio: Heart sounds are normal with regular rate and rhythm and no murmurs,  rubs or gallops. Peripheral pulses are normal and equal bilaterally without edema. No aortic or femoral bruits. Chest: symmetric with normal excursions and percussion.  Abdomen: Soft, with Nl bowel sounds. Nontender, no guarding, rebound, hernias, masses, or organomegaly.  Lymphatics: Non tender without lymphadenopathy.  Genitourinary: No hernias.Testes nl. DRE - prostate nl for age - smooth & firm w/o nodules. Musculoskeletal: Full ROM all peripheral extremities, joint stability, 5/5 strength, and normal gait. Skin: Warm and dry without rashes, lesions, cyanosis, clubbing or  ecchymosis.  Neuro: Cranial nerves intact, reflexes equal bilaterally. Normal muscle tone, no cerebellar symptoms. Sensation intact by touch, vibratory and Monofilament testing to the toes bilaterally.  Pysch: Alert and oriented X 3 with normal affect, insight and judgment appropriate.   Assessment and Plan  1. Annual Preventative/Screening Exam   1. Encounter for general adult medical examination with abnormal findings  - Microalbumin / creatinine urine ratio - EKG 12-Lead - [place], RETROPERITNL ABD,  LTD - POC Hemoccult Bld/Stl  - Urinalysis, Routine w reflex microscopic  - PSA - CBC with Differential/Platelet - BASIC METABOLIC PANEL WITH GFR - Hepatic function panel - Magnesium - Lipid panel - TSH - Hemoglobin A1c - Insulin, random - VITAMIN D 25 Hydroxy  - ranitidine (ZANTAC)  300 MG tablet; Take 1 to 2 tablets daily for heartburn & reflux to allow wean and transition from PPI / pantoprazole  Dispense: 180 tablet; Refill: 99  2. Essential hypertension  - Microalbumin / creatinine urine ratio - EKG 12-Lead - [place], RETROPERITNL ABD,  LTD - TSH  3. Mixed hyperlipidemia  - Lipid panel - TSH  4. Diabetes mellitus without complication (HCC)  - Hemoglobin A1c - Insulin, random  5. Vitamin D deficiency  - VITAMIN D 25 Hydroxy   6. Tobacco use disorder   7. Screening for rectal cancer  - POC  Hemoccult Bld/Stl   8. Prostate cancer screening  - PSA  9. Depression screen   10. Gastroesophageal reflux disease   11. Medication management  - Urinalysis, Routine w reflex microscopic  - CBC with Differential/Platelet - BASIC METABOLIC PANEL WITH GFR - Hepatic function panel - Magnesium   Continue prudent diet as discussed, weight control, BP monitoring, regular exercise, and medications as discussed.  Discussed med effects and SE's. Routine screening labs and tests as requested with regular follow-up as recommended. Over 40 minutes of exam, counseling, chart review and high complex critical decision making was performed

## 2015-04-25 NOTE — Patient Instructions (Signed)

## 2015-04-26 LAB — MICROALBUMIN / CREATININE URINE RATIO
Creatinine, Urine: 131 mg/dL (ref 20–370)
MICROALB UR: 0.4 mg/dL
MICROALB/CREAT RATIO: 3 ug/mg{creat} (ref <30)

## 2015-04-26 LAB — VITAMIN D 25 HYDROXY (VIT D DEFICIENCY, FRACTURES): Vit D, 25-Hydroxy: 29 ng/mL — ABNORMAL LOW (ref 30–100)

## 2015-04-26 LAB — TSH: TSH: 0.482 u[IU]/mL (ref 0.350–4.500)

## 2015-04-26 LAB — PSA: PSA: 2.07 ng/mL (ref <=4.00)

## 2015-04-26 LAB — INSULIN, RANDOM: INSULIN: 4 u[IU]/mL (ref 2.0–19.6)

## 2015-07-29 ENCOUNTER — Ambulatory Visit: Payer: Self-pay | Admitting: Internal Medicine

## 2015-08-12 ENCOUNTER — Ambulatory Visit (INDEPENDENT_AMBULATORY_CARE_PROVIDER_SITE_OTHER): Payer: Commercial Managed Care - HMO | Admitting: Internal Medicine

## 2015-08-12 ENCOUNTER — Encounter: Payer: Self-pay | Admitting: Internal Medicine

## 2015-08-12 VITALS — BP 128/66 | HR 90 | Temp 98.4°F | Resp 18 | Ht 70.0 in | Wt 144.0 lb

## 2015-08-12 DIAGNOSIS — I10 Essential (primary) hypertension: Secondary | ICD-10-CM

## 2015-08-12 DIAGNOSIS — E119 Type 2 diabetes mellitus without complications: Secondary | ICD-10-CM

## 2015-08-12 DIAGNOSIS — J441 Chronic obstructive pulmonary disease with (acute) exacerbation: Secondary | ICD-10-CM

## 2015-08-12 DIAGNOSIS — Z0000 Encounter for general adult medical examination without abnormal findings: Secondary | ICD-10-CM

## 2015-08-12 DIAGNOSIS — Z0001 Encounter for general adult medical examination with abnormal findings: Secondary | ICD-10-CM | POA: Diagnosis not present

## 2015-08-12 DIAGNOSIS — R6889 Other general symptoms and signs: Secondary | ICD-10-CM | POA: Diagnosis not present

## 2015-08-12 DIAGNOSIS — K219 Gastro-esophageal reflux disease without esophagitis: Secondary | ICD-10-CM | POA: Diagnosis not present

## 2015-08-12 DIAGNOSIS — F17200 Nicotine dependence, unspecified, uncomplicated: Secondary | ICD-10-CM

## 2015-08-12 DIAGNOSIS — Z79899 Other long term (current) drug therapy: Secondary | ICD-10-CM

## 2015-08-12 DIAGNOSIS — E782 Mixed hyperlipidemia: Secondary | ICD-10-CM

## 2015-08-12 DIAGNOSIS — E559 Vitamin D deficiency, unspecified: Secondary | ICD-10-CM

## 2015-08-12 LAB — HEPATIC FUNCTION PANEL
ALT: 8 U/L — AB (ref 9–46)
AST: 11 U/L (ref 10–35)
Albumin: 3.8 g/dL (ref 3.6–5.1)
Alkaline Phosphatase: 93 U/L (ref 40–115)
BILIRUBIN INDIRECT: 0.6 mg/dL (ref 0.2–1.2)
Bilirubin, Direct: 0.1 mg/dL (ref <=0.2)
TOTAL PROTEIN: 6.7 g/dL (ref 6.1–8.1)
Total Bilirubin: 0.7 mg/dL (ref 0.2–1.2)

## 2015-08-12 LAB — LIPID PANEL
CHOLESTEROL: 135 mg/dL (ref 125–200)
HDL: 29 mg/dL — AB (ref >=40)
LDL CALC: 84 mg/dL (ref <130)
TRIGLYCERIDES: 112 mg/dL (ref <150)
Total CHOL/HDL Ratio: 4.7 Ratio (ref <=5.0)
VLDL: 22 mg/dL (ref <30)

## 2015-08-12 LAB — BASIC METABOLIC PANEL WITH GFR
BUN: 11 mg/dL (ref 7–25)
CALCIUM: 9 mg/dL (ref 8.6–10.3)
CO2: 26 mmol/L (ref 20–31)
CREATININE: 1 mg/dL (ref 0.70–1.18)
Chloride: 101 mmol/L (ref 98–110)
GFR, EST AFRICAN AMERICAN: 88 mL/min (ref >=60)
GFR, EST NON AFRICAN AMERICAN: 76 mL/min (ref >=60)
GLUCOSE: 114 mg/dL — AB (ref 65–99)
Potassium: 3.8 mmol/L (ref 3.5–5.3)
SODIUM: 140 mmol/L (ref 135–146)

## 2015-08-12 LAB — CBC WITH DIFFERENTIAL/PLATELET
BASOS ABS: 0 {cells}/uL (ref 0–200)
Basophils Relative: 0 %
EOS PCT: 0 %
Eosinophils Absolute: 0 cells/uL — ABNORMAL LOW (ref 15–500)
HEMATOCRIT: 41.2 % (ref 38.5–50.0)
Hemoglobin: 13.8 g/dL (ref 13.2–17.1)
LYMPHS PCT: 8 %
Lymphs Abs: 1256 cells/uL (ref 850–3900)
MCH: 32.1 pg (ref 27.0–33.0)
MCHC: 33.5 g/dL (ref 32.0–36.0)
MCV: 95.8 fL (ref 80.0–100.0)
MPV: 10.2 fL (ref 7.5–12.5)
Monocytes Absolute: 1413 cells/uL — ABNORMAL HIGH (ref 200–950)
Monocytes Relative: 9 %
NEUTROS PCT: 83 %
Neutro Abs: 13031 cells/uL — ABNORMAL HIGH (ref 1500–7800)
Platelets: 322 10*3/uL (ref 140–400)
RBC: 4.3 MIL/uL (ref 4.20–5.80)
RDW: 13.7 % (ref 11.0–15.0)
WBC: 15.7 10*3/uL — AB (ref 3.8–10.8)

## 2015-08-12 LAB — TSH: TSH: 0.22 m[IU]/L — AB (ref 0.40–4.50)

## 2015-08-12 MED ORDER — PSEUDOEPH-BROMPHEN-DM 30-2-10 MG/5ML PO SYRP: 5.0000 mL | ORAL_SOLUTION | Freq: Four times a day (QID) | ORAL | Status: DC | PRN

## 2015-08-12 MED ORDER — ALBUTEROL SULFATE HFA 108 (90 BASE) MCG/ACT IN AERS: 2.0000 | INHALATION_SPRAY | RESPIRATORY_TRACT | Status: DC | PRN

## 2015-08-12 MED ORDER — PREDNISONE 20 MG PO TABS: ORAL_TABLET | ORAL | Status: DC

## 2015-08-12 MED ORDER — FLUTICASONE FUROATE-VILANTEROL 100-25 MCG/INH IN AEPB: 1.0000 | INHALATION_SPRAY | Freq: Every day | RESPIRATORY_TRACT | Status: DC

## 2015-08-12 MED ORDER — IPRATROPIUM-ALBUTEROL 0.5-2.5 (3) MG/3ML IN SOLN
3.0000 mL | Freq: Once | RESPIRATORY_TRACT | Status: AC
  Administered 2015-08-12: 3 mL via RESPIRATORY_TRACT

## 2015-08-12 MED ORDER — SUCRALFATE 1 G PO TABS: 1.0000 g | ORAL_TABLET | Freq: Four times a day (QID) | ORAL | Status: DC

## 2015-08-12 MED ORDER — AZITHROMYCIN 250 MG PO TABS: ORAL_TABLET | ORAL | Status: DC

## 2015-08-12 NOTE — Progress Notes (Signed)
MEDICARE ANNUAL WELLNESS VISIT AND FOLLOW UP Assessment:    1. Essential hypertension -well controlled currently -diet and exercise as tolerated -monitor at home - TSH  2. Diabetes mellitus without complication (HCC) -diet and exercise -cont metformin - Hemoglobin A1c  3. Mixed hyperlipidemia -diet and exercise -cont medication - Lipid panel  4. Vitamin D deficiency -cont supplement  5. Medication management  - CBC with Differential/Platelet - BASIC METABOLIC PANEL WITH GFR - Hepatic function panel  6. Gastroesophageal reflux disease, esophagitis presence not specified -add in carafate -cont protonix -avoid over eating -elevate HOB  7. Tobacco use disorder -not interested in quitting  8. COPD exacerbation ([place]) -duoneb given here -breo sample -albuterol -bromfenermine psuedaphedrine dm -prednisone -zpak  9. Routine general medical examination at a health care facility -due next year     Over 30 minutes of exam, counseling, chart review, and critical decision making was performed  Plan:   During the course of the visit the patient was educated and counseled about appropriate screening and preventive services including:    Pneumococcal vaccine   Influenza vaccine  Prevnar 13  Td vaccine  Screening electrocardiogram  Colorectal cancer screening  Diabetes screening  Glaucoma screening  Nutrition counseling   Conditions/risks identified: BMI: Discussed weight loss, diet, and increase physical activity.  Increase physical activity: AHA recommends 150 minutes of physical activity a week.  Medications reviewed Diabetes is at goal, ACE/ARB therapy: Yes. Urinary Incontinence is not an issue: discussed non pharmacology and pharmacology options.  Fall risk: low- discussed PT, home fall assessment, medications.    Subjective:  Jesus Bush is a 70 y.o. male who presents for Medicare Annual Wellness Visit and 3 month follow up for HTN,  hyperlipidemia, prediabetes, and vitamin D Def.  Date of last medicare wellness visit was is unknown.  His blood pressure has been controlled at home, today their BP is BP: 128/66 mmHg He does not workout. He denies chest pain, shortness of breath, dizziness.  He is on cholesterol medication and denies myalgias. His cholesterol is at goal. The cholesterol last visit was:   Lab Results  Component Value Date   CHOL 173 04/25/2015   HDL 34* 04/25/2015   LDLCALC 104 04/25/2015   TRIG 173* 04/25/2015   CHOLHDL 5.1* 04/25/2015   He has been working on diet and exercise for prediabetes, and denies foot ulcerations, hyperglycemia, hypoglycemia , increased appetite, nausea, paresthesia of the feet, polydipsia, polyuria, visual disturbances, vomiting and weight loss. Last A1C in the office was:  Lab Results  Component Value Date   HGBA1C 5.7* 04/25/2015   Last GFR Lab Results  Component Value Date   [facility] >89 04/25/2015     Lab Results  Component Value Date   GFRAA >89 04/25/2015   Patient is on Vitamin D supplement.   Lab Results  Component Value Date   VD25OH 29* 04/25/2015     He reports that he has been coughing a lot and has had sinus congestion for the past couple months.  He reports that he is coughing up yellow sputum.  He reports that his cough is the same all day long.  He reports that he coughs all night long.  He is a smoker.  He has not smoked since he got sick.  He is taking [place] HBP.    He reports that he is having a lot of heart burn. He reports that he is taking 300 mg zantac.  He is still taking protonix.  He  is having it at lunch and also at dinner about 1-2 hours after eating.  He reports that he can get reflux from eating a cracker.    Medication Review: Current Outpatient Prescriptions on File Prior to Visit  Medication Sig Dispense Refill  . aspirin 81 MG tablet Take 81 mg by mouth daily.    Marland Kitchen atorvastatin (LIPITOR) 40 MG tablet Take 1 tablet (40 mg  total) by mouth daily. 30 tablet 0  . Cholecalciferol (VITAMIN D) 2000 UNITS CAPS Take 1 capsule by mouth daily.    Marland Kitchen diltiazem (DILACOR XR) 240 MG 24 hr capsule Take 1 tablet daily for BP & heart beat 90 capsule 3  . losartan (COZAAR) 100 MG tablet TAKE 1 TABLET EVERY DAY FOR BLOOD PRESSURE 90 tablet 1  . magnesium gluconate (MAGONATE) 500 MG tablet Take 500 mg by mouth 2 (two) times daily.    . metFORMIN (GLUCOPHAGE-XR) 500 MG 24 hr tablet Take 1 tablet (500 mg total) by mouth 2 (two) times daily. 180 tablet 3  . pantoprazole (PROTONIX) 40 MG tablet TAKE 1 TABLET EVERY DAY 90 tablet 1  . ranitidine (ZANTAC) 300 MG tablet Take 1 to 2 tablets daily for heartburn & reflux to allow wean and transition from PPI / pantoprazole 180 tablet 99   No current facility-administered medications on file prior to visit.    Current Problems (verified) Patient Active Problem List   Diagnosis Date Noted  . Esophageal reflux 04/25/2015  . Depression screen 04/25/2015  . Prostate cancer screening 04/25/2015  . Screening for rectal cancer 04/25/2015  . Tobacco use disorder 07/23/2014  . Medication management 04/13/2014  . Essential hypertension 02/11/2013  . Mixed hyperlipidemia 02/11/2013  . Vitamin D deficiency 02/11/2013  . Diabetes mellitus without complication ([place]) 0000000    Screening Tests Immunization History  Administered Date(s) Administered  . DTaP 05/23/2006  . Influenza Whole 12/16/2012  . Influenza, High Dose Seasonal PF 01/17/2015  . Influenza-Unspecified 12/27/2013  . Pneumococcal Conjugate-13 04/13/2014  . Pneumococcal Polysaccharide-23 09/13/2008  . Zoster 04/17/2013    Preventative care: Last colonoscopy: 2009  Prior vaccinations: TD or Tdap: 2008  Influenza: 2016 Pneumococcal: 2010 Prevnar13: 2016 Shingles/Zostavax: 2015  Names of Other Physician/Practitioners you currently use: 1. [facility] here for primary care 2. Dr.  Einar Gip, eye doctor, last visit 2017 3. Does not see, dentures, dentist, last visit  Patient Care Team: Unk Pinto, MD as PCP - General (Internal Medicine) Unk Pinto, MD (Internal Medicine) Inda Castle, MD as Consulting Physician (Gastroenterology)  No past surgical history on file. Family History  Problem Relation Age of Onset  . Heart attack Mother   . Heart disease Mother   . Heart attack Brother   . Hypertension Brother   . Diabetes Sister   . Heart disease Sister   . Heart disease Father   . Heart attack Father    Social History  Substance Use Topics  . Smoking status: Current Every Day Smoker -- 0.03 packs/day for 10 years    Types: Cigarettes  . Smokeless tobacco: Never Used     Comment: smokes 4-5 cigarettes a week and uses E-cigarette  . Alcohol Use: No     Comment: He states he no longer drinks alcohol   Allergies  Allergen Reactions  . Ppd [Tuberculin Purified Protein Derivative]     Positive PPD.    MEDICARE WELLNESS OBJECTIVES: Tobacco use: He does smoke.  Patient is a former smoker. If yes, counseling  given Alcohol Current alcohol use: social drinker Diet: well balanced Physical activity: Current Exercise Habits: The patient does not participate in regular exercise at present Cardiac risk factors: Cardiac Risk Factors include: advanced age (>89men, >20 women);diabetes mellitus;dyslipidemia;hypertension;male gender;smoking/ tobacco exposure Depression/mood screen:   Depression screen [place] 2/9 08/12/2015  Decreased Interest 0  Down, Depressed, Hopeless 0  PHQ - 2 Score 0    ADLs:  In your present state of health, do you have any difficulty performing the following activities: 08/12/2015 04/25/2015  Hearing? N N  Vision? N N  Difficulty concentrating or making decisions? N N  Walking or climbing stairs? N N  Dressing or bathing? N N  Doing errands, shopping? N N  Preparing Food and eating ? N -  Using the Toilet? N -  In the past six  months, have you accidently leaked urine? N -  Do you have problems with loss of bowel control? N -  Managing your Medications? N -  Managing your Finances? N -  Housekeeping or managing your Housekeeping? N -     Cognitive Testing  Alert? Yes  Normal Appearance?Yes  Oriented to person? Yes  Place? Yes   Time? Yes  Recall of three objects?  Yes  Can perform simple calculations? Yes  Displays appropriate judgment?Yes  Can read the correct time from a watch face?Yes  EOL planning: Does patient have an advance directive?: Yes Type of Advance Directive: [place] Does patient want to make changes to advanced directive?: Yes - information given Copy of advanced directive(s) in chart?: No - copy requested   Objective:   Today's Vitals   08/12/15 0905  BP: 128/66  Pulse: 90  Temp: 98.4 F (36.9 C)  TempSrc: Temporal  Resp: 18  Height: 5\' 10"  (1.778 m)  Weight: 144 lb (65.318 kg)  SpO2: 93%   Body mass index is 20.66 kg/(m^2).  General appearance: Thin and ill appearing, alert, no distress, WD/WN, male HEENT: normocephalic, sclerae anicteric, TMs pearly, nares patent, no discharge or erythema, pharynx normal Oral cavity: MMM, no lesions Neck: supple, no lymphadenopathy, no thyromegaly, no masses Heart: RRR, normal S1, S2, no murmurs Lungs: CTA bilaterally, no rhonchi, or rales  Wheezes throughout Abdomen: +bs, soft, non tender, non distended, no masses, no hepatomegaly, no splenomegaly Musculoskeletal: nontender, no swelling, no obvious deformity Extremities: no edema, no cyanosis, no clubbing Pulses: 2+ symmetric, upper and lower extremities, normal cap refill Neurological: alert, oriented x 3, CN2-12 intact, strength normal upper extremities and lower extremities, sensation normal throughout, DTRs 2+ throughout, no cerebellar signs, gait normal Psychiatric: normal affect, behavior normal, pleasant   Medicare Attestation I have personally reviewed: The  patient's medical and social history Their use of alcohol, tobacco or illicit drugs Their current medications and supplements The patient's functional ability including ADLs,fall risks, home safety risks, cognitive, and hearing and visual impairment Diet and physical activities Evidence for depression or mood disorders  The patient's weight, height, BMI, and visual acuity have been recorded in the chart.  I have made referrals, counseling, and provided education to the patient based on review of the above and I have provided the patient with a written personalized care plan for preventive services.     Starlyn Skeans, PA-C   08/12/2015

## 2015-08-13 LAB — HEMOGLOBIN A1C
Hgb A1c MFr Bld: 6.2 % — ABNORMAL HIGH (ref <5.7)
Mean Plasma Glucose: 131 mg/dL

## 2015-08-15 ENCOUNTER — Other Ambulatory Visit: Payer: Self-pay | Admitting: Physician Assistant

## 2015-08-15 MED ORDER — PROMETHAZINE-DM 6.25-15 MG/5ML PO SYRP: 5.0000 mL | ORAL_SOLUTION | Freq: Four times a day (QID) | ORAL | Status: DC | PRN

## 2015-08-18 ENCOUNTER — Other Ambulatory Visit: Payer: Self-pay | Admitting: Internal Medicine

## 2015-09-14 DIAGNOSIS — H5210 Myopia, unspecified eye: Secondary | ICD-10-CM | POA: Diagnosis not present

## 2015-09-14 DIAGNOSIS — H524 Presbyopia: Secondary | ICD-10-CM | POA: Diagnosis not present

## 2015-09-23 DIAGNOSIS — E119 Type 2 diabetes mellitus without complications: Secondary | ICD-10-CM | POA: Diagnosis not present

## 2015-11-04 ENCOUNTER — Ambulatory Visit: Payer: Self-pay | Admitting: Internal Medicine

## 2015-11-17 ENCOUNTER — Ambulatory Visit: Payer: Self-pay | Admitting: Internal Medicine

## 2015-12-07 ENCOUNTER — Other Ambulatory Visit: Payer: Self-pay | Admitting: Internal Medicine

## 2015-12-12 DIAGNOSIS — H2513 Age-related nuclear cataract, bilateral: Secondary | ICD-10-CM | POA: Diagnosis not present

## 2015-12-12 DIAGNOSIS — H2511 Age-related nuclear cataract, right eye: Secondary | ICD-10-CM | POA: Diagnosis not present

## 2015-12-13 ENCOUNTER — Encounter: Payer: Self-pay | Admitting: Internal Medicine

## 2015-12-13 ENCOUNTER — Ambulatory Visit (INDEPENDENT_AMBULATORY_CARE_PROVIDER_SITE_OTHER): Payer: Commercial Managed Care - HMO | Admitting: Internal Medicine

## 2015-12-13 VITALS — BP 112/64 | HR 72 | Temp 97.3°F | Resp 16 | Ht 70.0 in | Wt 147.0 lb

## 2015-12-13 DIAGNOSIS — I10 Essential (primary) hypertension: Secondary | ICD-10-CM | POA: Diagnosis not present

## 2015-12-13 DIAGNOSIS — E782 Mixed hyperlipidemia: Secondary | ICD-10-CM

## 2015-12-13 DIAGNOSIS — E119 Type 2 diabetes mellitus without complications: Secondary | ICD-10-CM | POA: Diagnosis not present

## 2015-12-13 DIAGNOSIS — E559 Vitamin D deficiency, unspecified: Secondary | ICD-10-CM

## 2015-12-13 DIAGNOSIS — K219 Gastro-esophageal reflux disease without esophagitis: Secondary | ICD-10-CM

## 2015-12-13 DIAGNOSIS — Z79899 Other long term (current) drug therapy: Secondary | ICD-10-CM | POA: Diagnosis not present

## 2015-12-13 DIAGNOSIS — Z23 Encounter for immunization: Secondary | ICD-10-CM

## 2015-12-13 LAB — CBC WITH DIFFERENTIAL/PLATELET
Basophils Absolute: 68 cells/uL (ref 0–200)
Basophils Relative: 1 %
EOS ABS: 136 {cells}/uL (ref 15–500)
Eosinophils Relative: 2 %
HEMATOCRIT: 44.6 % (ref 38.5–50.0)
Hemoglobin: 15 g/dL (ref 13.2–17.1)
LYMPHS PCT: 25 %
Lymphs Abs: 1700 cells/uL (ref 850–3900)
MCH: 32.3 pg (ref 27.0–33.0)
MCHC: 33.6 g/dL (ref 32.0–36.0)
MCV: 95.9 fL (ref 80.0–100.0)
MONO ABS: 408 {cells}/uL (ref 200–950)
MONOS PCT: 6 %
MPV: 10.2 fL (ref 7.5–12.5)
NEUTROS PCT: 66 %
Neutro Abs: 4488 cells/uL (ref 1500–7800)
PLATELETS: 305 10*3/uL (ref 140–400)
RBC: 4.65 MIL/uL (ref 4.20–5.80)
RDW: 13.3 % (ref 11.0–15.0)
WBC: 6.8 10*3/uL (ref 3.8–10.8)

## 2015-12-13 LAB — LIPID PANEL
CHOLESTEROL: 200 mg/dL (ref 125–200)
HDL: 36 mg/dL — ABNORMAL LOW (ref >=40)
LDL CALC: 128 mg/dL (ref <130)
TRIGLYCERIDES: 178 mg/dL — AB (ref <150)
Total CHOL/HDL Ratio: 5.6 Ratio — ABNORMAL HIGH (ref <=5.0)
VLDL: 36 mg/dL — ABNORMAL HIGH (ref <30)

## 2015-12-13 LAB — HEPATIC FUNCTION PANEL
ALBUMIN: 4.3 g/dL (ref 3.6–5.1)
ALK PHOS: 87 U/L (ref 40–115)
ALT: 13 U/L (ref 9–46)
AST: 13 U/L (ref 10–35)
Bilirubin, Direct: 0.1 mg/dL (ref <=0.2)
Indirect Bilirubin: 0.5 mg/dL (ref 0.2–1.2)
TOTAL PROTEIN: 6.8 g/dL (ref 6.1–8.1)
Total Bilirubin: 0.6 mg/dL (ref 0.2–1.2)

## 2015-12-13 LAB — BASIC METABOLIC PANEL WITH GFR
BUN: 11 mg/dL (ref 7–25)
CALCIUM: 9.6 mg/dL (ref 8.6–10.3)
CO2: 26 mmol/L (ref 20–31)
CREATININE: 0.86 mg/dL (ref 0.70–1.18)
Chloride: 102 mmol/L (ref 98–110)
GFR, Est Non African American: 88 mL/min (ref >=60)
GLUCOSE: 103 mg/dL — AB (ref 65–99)
Potassium: 4.4 mmol/L (ref 3.5–5.3)
Sodium: 137 mmol/L (ref 135–146)

## 2015-12-13 LAB — MAGNESIUM: MAGNESIUM: 1.8 mg/dL (ref 1.5–2.5)

## 2015-12-13 LAB — TSH: TSH: 0.65 mIU/L (ref 0.40–4.50)

## 2015-12-13 MED ORDER — RANITIDINE HCL 300 MG PO TABS: ORAL_TABLET | ORAL | 1 refills | Status: DC

## 2015-12-13 NOTE — Progress Notes (Signed)
[facility] Unk Pinto, M.D.        Uvaldo Bristle. Silverio Lay, P.A.-C       Starlyn Skeans, P.A.-C  [facility]                [address] [place], [place] [id number] Telephone [phone] Telefax [phone] ______________________________________________________________________     This very nice 70 y.o. MWM presents for 6 month follow up with Hypertension, Hyperlipidemia, T2_NIDDM and Vitamin D Deficiency. Patient has hx/o GERD and currently sx's are controlled w/diet & Ranitidine     Patient is treated for HTN circa 2005  & BP has been controlled at home. Today's BP: 112/64. Patient has had no complaints of any cardiac type chest pain, palpitations, dyspnea/orthopnea/PND, dizziness, claudication, or dependent edema.     Hyperlipidemia is controlled with diet & meds. Patient denies myalgias or other med SE's. Last Lipids were  Lab Results  Component Value Date   CHOL 135 08/12/2015   HDL 29 (L) 08/12/2015   LDLCALC 84 08/12/2015   TRIG 112 08/12/2015   CHOLHDL 4.7 08/12/2015      Also, the patient has history of T2_NIDDM with A1c 7.5% circa 2010  and has had no symptoms of reactive hypoglycemia, diabetic polys, paresthesias or visual blurring.  Last A1c was still not at goal: Lab Results  Component Value Date   HGBA1C 6.2 (H) 08/12/2015      Further, the patient also has history of Vitamin D Deficiency in 2008 of "23" and supplements vitamin D sporadically and last vitamin D was still very low: Lab Results  Component Value Date   VD25OH 52 (L) 04/25/2015   Current Outpatient Prescriptions on File Prior to Visit  Medication Sig  . albuterol (PROVENTIL HFA;VENTOLIN HFA) 108 (90 Base) MCG/ACT inhaler Inhale 2 puffs into the lungs every 2 (two) hours as needed for wheezing or shortness of breath (cough).  Marland Kitchen aspirin 81 MG tablet Take 81 mg by mouth daily.  Marland Kitchen atorvastatin (LIPITOR) 40 MG tablet Take  1 tablet (40 mg total) by mouth daily.  Marland Kitchen VITAMIN D 2000 UNITS CAPS Take 1 capsule by mouth daily.  Marland Kitchen DILT-XR 240 MG 24 hr capsule TAKE 1 CAPSULE DAILY   . BREO ELLIPTA 100-25  Inhale 1 puff into the lungs daily.  Marland Kitchen losartan  100 MG tablet TAKE 1 TABLET EVERY DAY FOR BLOOD PRESSURE  . magnesium  500 MG tablet Take 500 mg by mouth 2 (two) times daily.  . metFORMIN-XR 500 MG Take 1 tablet (500 mg total) by mouth 2 (two) times daily.  . ranitidine  300 MG tablet Take 1 to 2 tablets daily for heartburn & reflux    Allergies  Allergen Reactions  . Ppd [Tuberculin Purified Protein Derivative]     Positive PPD.   PMHx:   Past Medical History:  Diagnosis Date  . Diabetes mellitus without complication ([place])   . Hyperlipidemia   . Hypertension   . Vitamin D deficiency    Immunization History  Administered Date(s) Administered  . DTaP 05/23/2006  . Influenza Whole 12/16/2012  . Influenza, High Dose Seasonal PF 01/17/2015  . Influenza-Unspecified 12/27/2013  . Pneumococcal Conjugate-13 04/13/2014  . Pneumococcal Polysaccharide-23 09/13/2008  . Zoster 04/17/2013   No past surgical history on file.   FHx:    Reviewed / unchanged  SHx:    Reviewed / unchanged  Systems Review:  Constitutional: Denies fever, chills, wt changes, headaches, insomnia, fatigue, night sweats, change in appetite. Eyes: Denies redness, blurred vision, diplopia, discharge, itchy, watery eyes.  ENT: Denies discharge, congestion, post nasal drip, epistaxis, sore throat, earache, hearing loss, dental pain, tinnitus, vertigo, sinus pain, snoring.  CV: Denies chest pain, palpitations, irregular heartbeat, syncope, dyspnea, diaphoresis, orthopnea, PND, claudication or edema. Respiratory: denies cough, dyspnea, DOE, pleurisy, hoarseness, laryngitis, wheezing.  Gastrointestinal: Denies dysphagia, odynophagia, heartburn, reflux, water brash, abdominal pain or cramps, nausea, vomiting, bloating, diarrhea, constipation,  hematemesis, melena, hematochezia  or hemorrhoids. Genitourinary: Denies dysuria, frequency, urgency, nocturia, hesitancy, discharge, hematuria or flank pain. Musculoskeletal: Denies arthralgias, myalgias, stiffness, jt. swelling, pain, limping or strain/sprain.  Skin: Denies pruritus, rash, hives, warts, acne, eczema or change in skin lesion(s). Neuro: No weakness, tremor, incoordination, spasms, paresthesia or pain. Psychiatric: Denies confusion, memory loss or sensory loss. Endo: Denies change in weight, skin or hair change.  Heme/Lymph: No excessive bleeding, bruising or enlarged lymph nodes.  Physical Exam BP 112/64   Pulse 72   Temp 97.3 F (36.3 C)   Resp 16   Ht 5\' 10"  (1.778 m)   Wt 147 lb (66.7 kg)   BMI 21.09 kg/m   Appears well nourished and in no distress.  Eyes: PERRLA, EOMs, conjunctiva no swelling or erythema. Sinuses: No frontal/maxillary tenderness ENT/Mouth: EAC's clear, TM's nl w/o erythema, bulging. Nares clear w/o erythema, swelling, exudates. Oropharynx clear without erythema or exudates. Oral hygiene is good. Tongue normal, non obstructing. Hearing intact.  Neck: Supple. Thyroid nl. Car 2+/2+ without bruits, nodes or JVD. Chest: Respirations nl with BS clear & equal w/o rales, rhonchi, wheezing or stridor.  Cor: Heart sounds normal w/ regular rate and rhythm without sig. murmurs, gallops, clicks, or rubs. Peripheral pulses normal and equal  without edema.  Abdomen: Soft & bowel sounds normal. Non-tender w/o guarding, rebound, hernias, masses, or organomegaly.  Lymphatics: Unremarkable.  Musculoskeletal: Full ROM all peripheral extremities, joint stability, 5/5 strength, and normal gait.  Skin: Warm, dry without exposed rashes, lesions or ecchymosis apparent.  Neuro: Cranial nerves intact, reflexes equal bilaterally. Sensory-motor testing grossly intact. Tendon reflexes grossly intact.  Pysch: Alert & oriented x 3.  Insight and judgement nl & appropriate. No  ideations.  Assessment and Plan:  1. Essential hypertension  - Continue medication, monitor blood pressure at home. Continue DASH diet. Reminder to go to the ER if any CP, SOB, nausea, dizziness, severe HA, changes vision/speech, left arm numbness and tingling and jaw pain. - TSH  2. Mixed hyperlipidemia  - Continue diet/meds, exercise,& lifestyle modifications. Continue monitor periodic cholesterol/liver & renal functions  - Lipid panel - TSH  3. Diabetes mellitus without complication ([place])  - Continue diet, exercise, lifestyle modifications. Monitor appropriate labs. - Hemoglobin A1c - Insulin, random  4. Vitamin D deficiency  - Continue supplementation. - VITAMIN D 25 Hydroxy   5. Gastroesophageal reflux disease  6. Medication management  - CBC with Differential/Platelet - BASIC METABOLIC PANEL WITH GFR - Hepatic function panel - Magnesium   Recommended regular exercise, BP monitoring, weight control, and discussed med and SE's. Recommended labs to assess and monitor clinical status. Further disposition pending results of labs. Over 30 minutes of exam, counseling, chart review was performed

## 2015-12-13 NOTE — Patient Instructions (Signed)
Recommend Adult Low Dose Aspirin or  coated  Aspirin 81 mg daily  To reduce risk of Colon Cancer 20 %,  Skin Cancer 26 % ,  Melanoma 46%  and  Pancreatic cancer +++++++++++++++++++++++ Vitamin D goal  is between 70-100.  Please make sure that you are taking your Vitamin D as directed.  It is very important as a natural anti-inflammatory  helping hair, skin, and nails, as well as reducing stroke and heart attack risk.  It helps your bones and helps with mood. It also decreases numerous cancer risks so please take it as directed.  Low Vit D is associated with a 200-300% higher risk for CANCER  and 200-300% higher risk for HEART   ATTACK  &  STROKE.   .....................................Marland Kitchen It is also associated with higher death rate at younger ages,  autoimmune diseases like Rheumatoid arthritis, Lupus, Multiple Sclerosis.    Also many other serious conditions, like depression, Alzheimer's Dementia, infertility, muscle aches, fatigue, fibromyalgia - just to name a few. +++++++++++++++++++ Recommend the book "The END of DIETING" by Dr Excell Seltzer  & the book "The END of DIABETES " by Dr Excell Seltzer At [email or web address] - get book & Audio CD's    Being diabetic has a  300% increased risk for heart attack, stroke, cancer, and alzheimer- type vascular dementia. It is very important that you work harder with diet by avoiding all foods that are white. Avoid white rice (brown & wild rice is OK), white potatoes (sweetpotatoes in moderation is OK), White bread or wheat bread or anything made out of white flour like bagels, donuts, rolls, buns, biscuits, cakes, pastries, cookies, pizza crust, and pasta (made from white flour & egg whites) - vegetarian pasta or spinach or wheat pasta is OK. Multigrain breads like Arnold's or Pepperidge Farm, or multigrain sandwich thins or flatbreads.  Diet, exercise and weight loss can reverse and cure diabetes in the early stages.  Diet, exercise and weight loss is very  important in the control and prevention of complications of diabetes which affects every system in your body, ie. Brain - dementia/stroke, eyes - glaucoma/blindness, heart - heart attack/heart failure, kidneys - dialysis, stomach - gastric paralysis, intestines - malabsorption, nerves - severe painful neuritis, circulation - gangrene & loss of a leg(s), and finally cancer and Alzheimers.    I recommend avoid fried & greasy foods,  sweets/candy, white rice (brown or wild rice or Quinoa is OK), white potatoes (sweet potatoes are OK) - anything made from white flour - bagels, doughnuts, rolls, buns, biscuits,white and wheat breads, pizza crust and traditional pasta made of white flour & egg white(vegetarian pasta or spinach or wheat pasta is OK).  Multi-grain bread is OK - like multi-grain flat bread or sandwich thins. Avoid alcohol in excess. Exercise is also important.    Eat all the vegetables you want - avoid meat, especially red meat and dairy - especially cheese.  Cheese is the most concentrated form of trans-fats which is the worst thing to clog up our arteries. Veggie cheese is OK which can be found in the fresh produce section at [employer] or [employer] or [employer]  ++++++++++++++++++++++++++++++++++++++++++++++++++ DASH Eating Plan  DASH stands for "Dietary Approaches to Stop Hypertension."   The DASH eating plan is a healthy eating plan that has been shown to reduce high blood pressure (hypertension). Additional health benefits may include reducing the risk of type 2 diabetes mellitus, heart disease, and stroke. The DASH eating plan may also help  with weight loss. WHAT DO I NEED TO KNOW ABOUT THE DASH EATING PLAN? For the DASH eating plan, you will follow these general guidelines:  Choose foods with a percent daily value for sodium of less than 5% (as listed on the food label).  Use salt-free seasonings or herbs instead of table salt or sea salt.  Check with your health care provider  or pharmacist before using salt substitutes.  Eat lower-sodium products, often labeled as "lower sodium" or "no salt added."  Eat fresh foods.  Eat more vegetables, fruits, and low-fat dairy products.  Choose whole grains. Look for the word "whole" as the first word in the ingredient list.  Choose fish   Limit sweets, desserts, sugars, and sugary drinks.  Choose heart-healthy fats.  Eat veggie cheese   Eat more home-cooked food and less restaurant, buffet, and fast food.  Limit fried foods.  Cook foods using methods other than frying.  Limit canned vegetables. If you do use them, rinse them well to decrease the sodium.  When eating at a restaurant, ask that your food be prepared with less salt, or no salt if possible.                      WHAT FOODS CAN I EAT? Read Dr Fara Olden Fuhrman's books on The End of Dieting & The End of Diabetes  Grains Whole grain or whole wheat bread. Brown rice. Whole grain or whole wheat pasta. Quinoa, bulgur, and whole grain cereals. Low-sodium cereals. Corn or whole wheat flour tortillas. Whole grain cornbread. Whole grain crackers. Low-sodium crackers.  Vegetables Fresh or frozen vegetables (raw, steamed, roasted, or grilled). Low-sodium or reduced-sodium tomato and vegetable juices. Low-sodium or reduced-sodium tomato sauce and paste. Low-sodium or reduced-sodium canned vegetables.   Fruits All fresh, canned (in natural juice), or frozen fruits.  Protein Products  All fish and seafood.  Dried beans, peas, or lentils. Unsalted nuts and seeds. Unsalted canned beans.  Dairy Low-fat dairy products, such as skim or 1% milk, 2% or reduced-fat cheeses, low-fat ricotta or cottage cheese, or plain low-fat yogurt. Low-sodium or reduced-sodium cheeses.  Fats and Oils Tub margarines without trans fats. Light or reduced-fat mayonnaise and salad dressings (reduced sodium). Avocado. Safflower, olive, or canola oils. Natural peanut or almond  butter.  Other Unsalted popcorn and pretzels. The items listed above may not be a complete list of recommended foods or beverages. Contact your dietitian for more options.  ++++++++++++++++++++++++  WHAT FOODS ARE NOT RECOMMENDED? Grains/ White flour or wheat flour White bread. White pasta. White rice. Refined cornbread. Bagels and croissants. Crackers that contain trans fat.  Vegetables  Creamed or fried vegetables. Vegetables in a . Regular canned vegetables. Regular canned tomato sauce and paste. Regular tomato and vegetable juices.  Fruits Dried fruits. Canned fruit in light or heavy syrup. Fruit juice.  Meat and Other Protein Products Meat in general - RED meat & White meat.  Fatty cuts of meat. Ribs, chicken wings, all processed meats as bacon, sausage, bologna, salami, fatback, hot dogs, bratwurst and packaged luncheon meats.  Dairy Whole or 2% milk, cream, half-and-half, and cream cheese. Whole-fat or sweetened yogurt. Full-fat cheeses or blue cheese. Non-dairy creamers and whipped toppings. Processed cheese, cheese spreads, or cheese curds.  Condiments Onion and garlic salt, seasoned salt, table salt, and sea salt. Canned and packaged gravies. Worcestershire sauce. Tartar sauce. Barbecue sauce. Teriyaki sauce. Soy sauce, including reduced sodium. Steak sauce. Fish sauce. Oyster sauce. Cocktail sauce.  Horseradish. Ketchup and mustard. Meat flavorings and tenderizers. Bouillon cubes. Hot sauce. Tabasco sauce. Marinades. Taco seasonings. Relishes.  Fats and Oils Butter, stick margarine, lard, shortening and bacon fat. Coconut, palm kernel, or palm oils. Regular salad dressings.  Pickles and olives. Salted popcorn and pretzels.  The items listed above may not be a complete list of foods and beverages to avoid.

## 2015-12-14 LAB — VITAMIN D 25 HYDROXY (VIT D DEFICIENCY, FRACTURES): Vit D, 25-Hydroxy: 61 ng/mL (ref 30–100)

## 2015-12-14 LAB — HEMOGLOBIN A1C
Hgb A1c MFr Bld: 5.6 % (ref <5.7)
MEAN PLASMA GLUCOSE: 114 mg/dL

## 2015-12-14 LAB — INSULIN, RANDOM: INSULIN: 11.8 u[IU]/mL (ref 2.0–19.6)

## 2016-01-09 ENCOUNTER — Other Ambulatory Visit: Payer: Self-pay | Admitting: Internal Medicine

## 2016-01-10 ENCOUNTER — Other Ambulatory Visit: Payer: Self-pay | Admitting: Internal Medicine

## 2016-01-31 DIAGNOSIS — H2511 Age-related nuclear cataract, right eye: Secondary | ICD-10-CM | POA: Diagnosis not present

## 2016-01-31 DIAGNOSIS — H25011 Cortical age-related cataract, right eye: Secondary | ICD-10-CM | POA: Diagnosis not present

## 2016-01-31 DIAGNOSIS — H25041 Posterior subcapsular polar age-related cataract, right eye: Secondary | ICD-10-CM | POA: Diagnosis not present

## 2016-02-01 DIAGNOSIS — H2512 Age-related nuclear cataract, left eye: Secondary | ICD-10-CM | POA: Diagnosis not present

## 2016-02-13 DIAGNOSIS — H2512 Age-related nuclear cataract, left eye: Secondary | ICD-10-CM | POA: Diagnosis not present

## 2016-02-13 DIAGNOSIS — H25042 Posterior subcapsular polar age-related cataract, left eye: Secondary | ICD-10-CM | POA: Diagnosis not present

## 2016-02-13 DIAGNOSIS — H25012 Cortical age-related cataract, left eye: Secondary | ICD-10-CM | POA: Diagnosis not present

## 2016-02-14 DIAGNOSIS — H2512 Age-related nuclear cataract, left eye: Secondary | ICD-10-CM | POA: Diagnosis not present

## 2016-02-21 ENCOUNTER — Other Ambulatory Visit: Payer: Self-pay | Admitting: Internal Medicine

## 2016-03-23 ENCOUNTER — Encounter: Payer: Self-pay | Admitting: Physician Assistant

## 2016-03-23 ENCOUNTER — Ambulatory Visit (INDEPENDENT_AMBULATORY_CARE_PROVIDER_SITE_OTHER): Payer: Commercial Managed Care - HMO | Admitting: Physician Assistant

## 2016-03-23 VITALS — BP 116/70 | HR 72 | Temp 98.1°F | Resp 16 | Ht 70.0 in | Wt 151.8 lb

## 2016-03-23 DIAGNOSIS — I10 Essential (primary) hypertension: Secondary | ICD-10-CM | POA: Diagnosis not present

## 2016-03-23 DIAGNOSIS — Z79899 Other long term (current) drug therapy: Secondary | ICD-10-CM | POA: Diagnosis not present

## 2016-03-23 DIAGNOSIS — E119 Type 2 diabetes mellitus without complications: Secondary | ICD-10-CM

## 2016-03-23 DIAGNOSIS — E559 Vitamin D deficiency, unspecified: Secondary | ICD-10-CM | POA: Diagnosis not present

## 2016-03-23 DIAGNOSIS — F17200 Nicotine dependence, unspecified, uncomplicated: Secondary | ICD-10-CM

## 2016-03-23 DIAGNOSIS — E782 Mixed hyperlipidemia: Secondary | ICD-10-CM | POA: Diagnosis not present

## 2016-03-23 LAB — CBC WITH DIFFERENTIAL/PLATELET
Basophils Absolute: 59 cells/uL (ref 0–200)
Basophils Relative: 1 %
Eosinophils Absolute: 59 cells/uL (ref 15–500)
Eosinophils Relative: 1 %
HCT: 43.4 % (ref 38.5–50.0)
Hemoglobin: 14.4 g/dL (ref 13.2–17.1)
Lymphocytes Relative: 27 %
Lymphs Abs: 1593 cells/uL (ref 850–3900)
MCH: 32.1 pg (ref 27.0–33.0)
MCHC: 33.2 g/dL (ref 32.0–36.0)
MCV: 96.7 fL (ref 80.0–100.0)
MPV: 9.8 fL (ref 7.5–12.5)
Monocytes Absolute: 413 cells/uL (ref 200–950)
Monocytes Relative: 7 %
Neutro Abs: 3776 cells/uL (ref 1500–7800)
Neutrophils Relative %: 64 %
Platelets: 312 10*3/uL (ref 140–400)
RBC: 4.49 MIL/uL (ref 4.20–5.80)
RDW: 14.4 % (ref 11.0–15.0)
WBC: 5.9 10*3/uL (ref 3.8–10.8)

## 2016-03-23 LAB — TSH: TSH: 0.5 mIU/L (ref 0.40–4.50)

## 2016-03-23 NOTE — Progress Notes (Signed)
Assessment and Plan:  Hypertension -Continue medication, monitor blood pressure at home. Continue DASH diet.  Reminder to go to the ER if any CP, SOB, nausea, dizziness, severe HA, changes vision/speech, left arm numbness and tingling and jaw pain.  Cholesterol -Continue diet and exercise. Check cholesterol.    Diabetes without complications -Continue diet and exercise. Continue metformin  Vitamin D Def - check level and continue medications.   Smoking cessation-   instruction/counseling given, counseled patient on the dangers of tobacco use, advised patient to stop smoking, and reviewed strategies to maximize success, patient not ready to quit at this time.    Continue diet and meds as discussed. Further disposition pending results of labs. Discussed med's effects and SE's.   Over 30 minutes of exam, counseling, chart review, and critical decision making was performed Future Appointments Date Time Provider [place]  08/06/2016 3:00 PM Unk Pinto, MD [facility] None     HPI 70 y.o. male  presents for 3 month follow up on hypertension, cholesterol, diabetes and vitamin D deficiency.   His blood pressure has been controlled at home, today his BP is BP: 116/70.  He does workout. He denies chest pain, shortness of breath, dizziness. He continues to smoke, has history of COPD, not on anything.   He is on cholesterol medication, lipitor 40mg  QD and denies myalgias. His cholesterol is not at goal, less than 70. The cholesterol was:   Lab Results  Component Value Date   CHOL 200 12/13/2015   HDL 36 (L) 12/13/2015   LDLCALC 128 12/13/2015   TRIG 178 (H) 12/13/2015   CHOLHDL 5.6 (H) 12/13/2015    He has been working on diet and exercise for diabetes without complications, with weight loss his sugars are not in normal range, he is on bASA, he is on ACE/ARB, and denies  paresthesia of the feet, polydipsia, polyuria and visual disturbances. Last A1C was:  Lab Results   Component Value Date   HGBA1C 5.6 12/13/2015    Patient is on Vitamin D supplement. Lab Results  Component Value Date   VD25OH 61 12/13/2015   BMI is Body mass index is 21.78 kg/m., His weight has gone up.  Wt Readings from Last 3 Encounters:  03/23/16 151 lb 12.8 oz (68.9 kg)  12/13/15 147 lb (66.7 kg)  08/12/15 144 lb (65.3 kg)    Current Medications:  Current Outpatient Prescriptions on File Prior to Visit  Medication Sig Dispense Refill  . albuterol (PROVENTIL HFA;VENTOLIN HFA) 108 (90 Base) MCG/ACT inhaler Inhale 2 puffs into the lungs every 2 (two) hours as needed for wheezing or shortness of breath (cough). 1 Inhaler 0  . aspirin 81 MG tablet Take 81 mg by mouth daily.    Marland Kitchen atorvastatin (LIPITOR) 40 MG tablet TAKE 1 TABLET EVERY DAY 90 tablet 3  . Cholecalciferol (VITAMIN D) 2000 UNITS CAPS Take 1 capsule by mouth daily.    Marland Kitchen DILT-XR 240 MG 24 hr capsule TAKE 1 CAPSULE DAILY FOR BLOOD PRESSURE & HEART BEAT 30 capsule 0  . fluticasone furoate-vilanterol (BREO ELLIPTA) 100-25 MCG/INH AEPB Inhale 1 puff into the lungs daily. 1 each 0  . losartan (COZAAR) 100 MG tablet TAKE 1 TABLET EVERY DAY FOR BLOOD PRESSURE 90 tablet 1  . magnesium gluconate (MAGONATE) 500 MG tablet Take 500 mg by mouth 2 (two) times daily.    . metFORMIN (GLUCOPHAGE-XR) 500 MG 24 hr tablet TAKE 1 TABLET TWICE DAILY 180 tablet 3  . ranitidine (ZANTAC) 300 MG  tablet Take 1 to tablet daily for heartburn & reflux 90 tablet 1  . sucralfate (CARAFATE) 1 g tablet Take 1 tablet (1 g total) by mouth 4 (four) times daily. 120 tablet 1   No current facility-administered medications on file prior to visit.    Medical History:  Past Medical History:  Diagnosis Date  . Diabetes mellitus without complication ([place])   . Hyperlipidemia   . Hypertension   . Vitamin D deficiency    Allergies:  Allergies  Allergen Reactions  . Ppd [Tuberculin Purified Protein Derivative]     Positive PPD.     Review of Systems:   Review of Systems  Constitutional: Positive for malaise/fatigue. Negative for chills, diaphoresis, fever and weight loss.  HENT: Negative.   Eyes: Negative.   Respiratory: Negative.   Cardiovascular: Negative.   Gastrointestinal: Negative for abdominal pain, blood in stool, constipation, diarrhea, heartburn, melena, nausea and vomiting.  Genitourinary: Negative.   Musculoskeletal: Negative.   Skin: Negative.   Neurological: Negative.  Negative for weakness.  Endo/Heme/Allergies: Negative.   Psychiatric/Behavioral: Negative.     Family history- Review and unchanged Social history- Review and unchanged Physical Exam: BP 116/70   Pulse 72   Temp 98.1 F (36.7 C)   Resp 16   Ht 5\' 10"  (1.778 m)   Wt 151 lb 12.8 oz (68.9 kg)   SpO2 98%   BMI 21.78 kg/m  Wt Readings from Last 3 Encounters:  03/23/16 151 lb 12.8 oz (68.9 kg)  12/13/15 147 lb (66.7 kg)  08/12/15 144 lb (65.3 kg)   General Appearance: Well nourished, in no apparent distress. Eyes: PERRLA, EOMs, conjunctiva no swelling or erythema Sinuses: No Frontal/maxillary tenderness ENT/Mouth: Ext aud canals clear, TMs without erythema, bulging. No erythema, swelling, or exudate on post pharynx.  Tonsils not swollen or erythematous. Hearing normal.  Neck: Supple, thyroid normal.  Respiratory: Respiratory effort normal, BS equal bilaterally without rales, rhonchi, wheezing or stridor.  Cardio: RRR with no MRGs. Brisk peripheral pulses without edema.  Abdomen: Soft, + BS.  Non tender,  guarding, without rebound, hernias, masses. Lymphatics: Non tender without lymphadenopathy.  Musculoskeletal: Full ROM, 5/5 strength, Normal gait Skin: Warm, dry without rashes, lesions, ecchymosis.  Neuro: Cranial nerves intact. No cerebellar symptoms.  Psych: Awake and oriented X 3, normal affect, Insight and Judgment appropriate.    Vicie Mutters, PA-C 9:22 AM [facility]

## 2016-03-23 NOTE — Patient Instructions (Signed)
If you have a smart phone, please look up Smoke Free app, this will help you stay on track and give you information about money you have saved, life that you have gained back and a ton of more information.   We are giving you chantix for smoking cessation. You can do it! And we are here to help! You may have heard some scary side effects about chantix, the three most common I hear about are nausea, crazy dreams and depression.  However, I like for my patients to try to stay on 1/2 a tablet twice a day rather than one tablet twice a day as normally prescribed. This helps decrease the chances of side effects and helps save money by making a one month prescription last two months  Please start the prescription this way:  Start 1/2 tablet by mouth once daily after food with a full glass of water for 3 days Then do 1/2 tablet by mouth twice daily for 4 days. During this first week you can smoke, but try to stop after this week.  At this point we have several options: 1) continue on 1/2 tablet twice a day- which I encourage you to do. You can stay on this dose the rest of the time on the medication or if you still feel the need to smoke you can do one of the two options below. 2) do one tablet in the morning and 1/2 in the evening which helps decrease dreams. 3) do one tablet twice a day.   What if I miss a dose? If you miss a dose, take it as soon as you can. If it is almost time for your next dose, take only that dose. Do not take double or extra doses.  What should I watch for while using this medicine? Visit your doctor or health care professional for regular check ups. Ask for ongoing advice and encouragement from your doctor or healthcare professional, friends, and family to help you quit. If you smoke while on this medication, quit again  Your mouth may get dry. Chewing sugarless gum or hard candy, and drinking plenty of water may help. Contact your doctor if the problem does not go away or is  severe.  You may get drowsy or dizzy. Do not drive, use machinery, or do anything that needs mental alertness until you know how this medicine affects you. Do not stand or sit up quickly, especially if you are an older patient.   The use of this medicine may increase the chance of suicidal thoughts or actions. Pay special attention to how you are responding while on this medicine. Any worsening of mood, or thoughts of suicide or dying should be reported to your health care professional right away.  ADVANTAGES OF QUITTING SMOKING  Within 20 minutes, blood pressure decreases. Your pulse is at normal level.  After 8 hours, carbon monoxide levels in the blood return to normal. Your oxygen level increases.  After 24 hours, the chance of having a heart attack starts to decrease. Your breath, hair, and body stop smelling like smoke.  After 48 hours, damaged nerve endings begin to recover. Your sense of taste and smell improve.  After 72 hours, the body is virtually free of nicotine. Your bronchial tubes relax and breathing becomes easier.  After 2 to 12 weeks, lungs can hold more air. Exercise becomes easier and circulation improves.  After 1 year, the risk of coronary heart disease is cut in half.  After 5 years,   the risk of stroke falls to the same as a nonsmoker.  After 10 years, the risk of lung cancer is cut in half and the risk of other cancers decreases significantly.  After 15 years, the risk of coronary heart disease drops, usually to the level of a nonsmoker.  You will have extra money to spend on things other than cigarettes. Chronic Obstructive Pulmonary Disease Chronic obstructive pulmonary disease (COPD) is a common lung condition in which airflow from the lungs is limited. COPD is a general term that can be used to describe many different lung problems that limit airflow, including both chronic bronchitis and emphysema. If you have COPD, your lung function will probably never  return to normal, but there are measures you can take to improve lung function and make yourself feel better. What are the causes?  Smoking (common).  Exposure to secondhand smoke.  Genetic problems.  Chronic inflammatory lung diseases or recurrent infections. What are the signs or symptoms?  Shortness of breath, especially with physical activity.  Deep, persistent (chronic) cough with a large amount of thick mucus.  Wheezing.  Rapid breaths (tachypnea).  Gray or bluish discoloration (cyanosis) of the skin, especially in your fingers, toes, or lips.  Fatigue.  Weight loss.  Frequent infections or episodes when breathing symptoms become much worse (exacerbations).  Chest tightness. How is this diagnosed? Your health care provider will take a medical history and perform a physical examination to diagnose COPD. Additional tests for COPD may include:  Lung (pulmonary) function tests.  Chest X-ray.  CT scan.  Blood tests.  How is this treated? Treatment for COPD may include:  Inhaler and nebulizer medicines. These help manage the symptoms of COPD and make your breathing more comfortable.  Supplemental oxygen. Supplemental oxygen is only helpful if you have a low oxygen level in your blood.  Exercise and physical activity. These are beneficial for nearly all people with COPD.  Lung surgery or transplant.  Nutrition therapy to gain weight, if you are underweight.  Pulmonary rehabilitation. This may involve working with a team of health care providers and specialists, such as respiratory, occupational, and physical therapists.  Follow these instructions at home:  Take all medicines (inhaled or pills) as directed by your health care provider.  Avoid over-the-counter medicines or cough syrups that dry up your airway (such as antihistamines) and slow down the elimination of secretions unless instructed otherwise by your health care provider.  If you are a smoker, the  most important thing that you can do is stop smoking. Continuing to smoke will cause further lung damage and breathing trouble. Ask your health care provider for help with quitting smoking. He or she can direct you to community resources or hospitals that provide support.  Avoid exposure to irritants such as smoke, chemicals, and fumes that aggravate your breathing.  Use oxygen therapy and pulmonary rehabilitation if directed by your health care provider. If you require home oxygen therapy, ask your health care provider whether you should purchase a pulse oximeter to measure your oxygen level at home.  Avoid contact with individuals who have a contagious illness.  Avoid extreme temperature and humidity changes.  Eat healthy foods. Eating smaller, more frequent meals and resting before meals may help you maintain your strength.  Stay active, but balance activity with periods of rest. Exercise and physical activity will help you maintain your ability to do things you want to do.  Preventing infection and hospitalization is very important when you have   COPD. Make sure to receive all the vaccines your health care provider recommends, especially the pneumococcal and influenza vaccines. Ask your health care provider whether you need a pneumonia vaccine.  Learn and use relaxation techniques to manage stress.  Learn and use controlled breathing techniques as directed by your health care provider. Controlled breathing techniques include: 1. Pursed lip breathing. Start by breathing in (inhaling) through your nose for 1 second. Then, purse your lips as if you were going to whistle and breathe out (exhale) through the pursed lips for 2 seconds. 2. Diaphragmatic breathing. Start by putting one hand on your abdomen just above your waist. Inhale slowly through your nose. The hand on your abdomen should move out. Then purse your lips and exhale slowly. You should be able to feel the hand on your abdomen moving  in as you exhale.  Learn and use controlled coughing to clear mucus from your lungs. Controlled coughing is a series of short, progressive coughs. The steps of controlled coughing are: 1. Lean your head slightly forward. 2. Breathe in deeply using diaphragmatic breathing. 3. Try to hold your breath for 3 seconds. 4. Keep your mouth slightly open while coughing twice. 5. Spit any mucus out into a tissue. 6. Rest and repeat the steps once or twice as needed. Contact a health care provider if:  You are coughing up more mucus than usual.  There is a change in the color or thickness of your mucus.  Your breathing is more labored than usual.  Your breathing is faster than usual. Get help right away if:  You have shortness of breath while you are resting.  You have shortness of breath that prevents you from: ? Being able to talk. ? Performing your usual physical activities.  You have chest pain lasting longer than 5 minutes.  Your skin color is more cyanotic than usual.  You measure low oxygen saturations for longer than 5 minutes with a pulse oximeter. This information is not intended to replace advice given to you by your health care provider. Make sure you discuss any questions you have with your health care provider. Document Released: 12/20/2004 Document Revised: 08/18/2015 Document Reviewed: 11/06/2012 Elsevier Interactive Patient Education  2017 [employer]  

## 2016-03-24 LAB — HEPATIC FUNCTION PANEL
ALBUMIN: 4.3 g/dL (ref 3.6–5.1)
ALK PHOS: 100 U/L (ref 40–115)
ALT: 13 U/L (ref 9–46)
AST: 16 U/L (ref 10–35)
BILIRUBIN TOTAL: 0.5 mg/dL (ref 0.2–1.2)
Bilirubin, Direct: 0.1 mg/dL (ref <=0.2)
Indirect Bilirubin: 0.4 mg/dL (ref 0.2–1.2)
Total Protein: 6.8 g/dL (ref 6.1–8.1)

## 2016-03-24 LAB — BASIC METABOLIC PANEL WITH GFR
BUN: 8 mg/dL (ref 7–25)
CHLORIDE: 107 mmol/L (ref 98–110)
CO2: 23 mmol/L (ref 20–31)
Calcium: 9.8 mg/dL (ref 8.6–10.3)
Creat: 0.89 mg/dL (ref 0.70–1.18)
GFR, Est African American: >89 mL/min (ref >=60)
GFR, Est Non African American: 86 mL/min (ref >=60)
Glucose, Bld: 100 mg/dL — ABNORMAL HIGH (ref 65–99)
POTASSIUM: 4.5 mmol/L (ref 3.5–5.3)
Sodium: 143 mmol/L (ref 135–146)

## 2016-03-24 LAB — LIPID PANEL
CHOL/HDL RATIO: 4.5 ratio (ref <5.0)
Cholesterol: 157 mg/dL (ref <200)
HDL: 35 mg/dL — AB (ref >40)
LDL CALC: 94 mg/dL (ref <100)
Triglycerides: 141 mg/dL (ref <150)
VLDL: 28 mg/dL (ref <30)

## 2016-03-24 LAB — MAGNESIUM: Magnesium: 1.6 mg/dL (ref 1.5–2.5)

## 2016-03-26 HISTORY — PX: CATARACT EXTRACTION, BILATERAL: SHX1313

## 2016-05-10 ENCOUNTER — Encounter: Payer: Self-pay | Admitting: Internal Medicine

## 2016-06-14 ENCOUNTER — Other Ambulatory Visit: Payer: Self-pay | Admitting: *Deleted

## 2016-06-14 MED ORDER — DILTIAZEM HCL ER 240 MG PO CP24: ORAL_CAPSULE | ORAL | 0 refills | Status: DC

## 2016-07-19 ENCOUNTER — Other Ambulatory Visit: Payer: Self-pay | Admitting: Physician Assistant

## 2016-08-05 ENCOUNTER — Encounter: Payer: Self-pay | Admitting: Internal Medicine

## 2016-08-05 NOTE — Progress Notes (Signed)
[facility]   Unk Pinto, M.D.      Uvaldo Bristle. Silverio Lay, P.A.-C [facility]                [address] [place] [address] Telephone [phone] Telefax [phone] Annual  Screening/Preventative Visit  & Comprehensive Evaluation & Examination     This very nice 71 y.o. MWM presents for a Screening/Preventative Visit & comprehensive evaluation and management of multiple medical co-morbidities.  Patient has been followed for HTN, T2_NIDDM, Hyperlipidemia and Vitamin D Deficiency.  Today patient also relates LUTS of Prostatism with Noct x 4-5. Patient has hx/o GERD controlled with prudent diet and medications.       HTN predates since 2005. Patient's BP has been controlled at home.  Today's BP is at goal - 102/56.  Patient denies any cardiac symptoms as chest pain, palpitations, shortness of breath, dizziness or ankle swelling.     Patient's hyperlipidemia is controlled with diet and medications. Patient denies myalgias or other medication SE's. Last lipids were at goal:  Lab Results  Component Value Date   CHOL 157 03/23/2016   HDL 35 (L) 03/23/2016   LDLCALC 94 03/23/2016   TRIG 141 03/23/2016   CHOLHDL 4.5 03/23/2016      Patient has T2_NIDDM (A1c 7.5% in 2010) and patient denies reactive hypoglycemic symptoms, visual blurring, diabetic polys or paresthesias. Patient is tolerating Metformin w/o problems. Last A1c was at goal: Lab Results  Component Value Date   HGBA1C 5.6 12/13/2015       Finally, patient has history of Vitamin D Deficiency ("23" in 2008)  and last vitamin D was at goal: Lab Results  Component Value Date   VD25OH 61 12/13/2015   Current Outpatient Prescriptions on File Prior to Visit  Medication Sig  . aspirin 81 MG tablet Take 81 mg by mouth daily.  Marland Kitchen atorvastatin (LIPITOR) 40 MG tablet TAKE 1 TABLET EVERY DAY  . Cholecalciferol (VITAMIN D) 2000 UNITS CAPS Take 1  capsule by mouth daily.  Marland Kitchen diltiazem (DILT-XR) 240 MG 24 hr capsule TAKE 1 CAPSULE DAILY FOR BLOOD PRESSURE & HEART BEAT  . losartan (COZAAR) 100 MG tablet TAKE 1 TABLET EVERY DAY FOR BLOOD PRESSURE  . magnesium gluconate (MAGONATE) 500 MG tablet Take 500 mg by mouth 2 (two) times daily.  . metFORMIN (GLUCOPHAGE-XR) 500 MG 24 hr tablet TAKE 1 TABLET TWICE DAILY  . ranitidine (ZANTAC) 300 MG tablet Take 1 to tablet daily for heartburn & reflux   Allergies  Allergen Reactions  . Ppd [Tuberculin Purified Protein Derivative]     Positive PPD.   Past Medical History:  Diagnosis Date  . Diabetes mellitus without complication ([place])   . Hyperlipidemia   . Hypertension   . Vitamin D deficiency    Health Maintenance  Topic Date Due  . PNA vac Low Risk Adult (2 of 2 - PPSV23) 04/14/2015  . OPHTHALMOLOGY EXAM  10/25/2015  . FOOT EXAM  04/24/2016  . TETANUS/TDAP  05/23/2016  . HEMOGLOBIN A1C  06/11/2016  . Hepatitis C Screening  01/25/2020 (Originally December 02, 1945)  . INFLUENZA VACCINE  10/24/2016  . COLONOSCOPY  08/14/2017   Immunization History  Administered Date(s) Administered  . DTaP 05/23/2006  . Influenza Whole 12/16/2012  . Influenza, High Dose Seasonal PF 01/17/2015, 12/13/2015  . Influenza-Unspecified 12/27/2013  . Pneumococcal Conjugate-13 04/13/2014  . Pneumococcal  Polysaccharide-23 09/13/2008  . Zoster 04/17/2013   No past surgical history on file.  Family History  Problem Relation Age of Onset  . Heart attack Mother   . Heart disease Mother   . Heart attack Brother   . Hypertension Brother   . Diabetes Sister   . Heart disease Sister   . Heart disease Father   . Heart attack Father      ROS Constitutional: Denies fever, chills, weight loss/gain, headaches, insomnia,  night sweats or change in appetite. Does c/o fatigue. Eyes: Denies redness, blurred vision, diplopia, discharge, itchy or watery eyes.  ENT: Denies discharge, congestion, post nasal drip,  epistaxis, sore throat, earache, hearing loss, dental pain, Tinnitus, Vertigo, Sinus pain or snoring.  Cardio: Denies chest pain, palpitations, irregular heartbeat, syncope, dyspnea, diaphoresis, orthopnea, PND, claudication or edema Respiratory: denies cough, dyspnea, DOE, pleurisy, hoarseness, laryngitis or wheezing.  Gastrointestinal: Denies dysphagia, heartburn, reflux, water brash, pain, cramps, nausea, vomiting, bloating, diarrhea, constipation, hematemesis, melena, hematochezia, jaundice or hemorrhoids Genitourinary: Denies dysuria, frequency, urgency, nocturia, hesitancy, discharge, hematuria or flank pain Musculoskeletal: Denies arthralgia, myalgia, stiffness, Jt. Swelling, pain, limp or strain/sprain. Denies Falls. Skin: Denies puritis, rash, hives, warts, acne, eczema or change in skin lesion Neuro: No weakness, tremor, incoordination, spasms, paresthesia or pain Psychiatric: Denies confusion, memory loss or sensory loss. Denies Depression. Endocrine: Denies change in weight, skin, hair change, nocturia, and paresthesia, diabetic polys, visual blurring or hyper / hypo glycemic episodes.  Heme/Lymph: No excessive bleeding, bruising or enlarged lymph nodes.  Physical Exam  BP (!) 102/56   Pulse 76   Temp 97 F (36.1 C)   Resp 16   Ht 5' 9.5" (1.765 m)   Wt 148 lb 9.6 oz (67.4 kg)   BMI 21.63 kg/m   General Appearance: Well nourished and well groomed and in no apparent distress.  Eyes: PERRLA, EOMs, conjunctiva no swelling or erythema, normal fundi and vessels. Sinuses: No frontal/maxillary tenderness ENT/Mouth: EACs patent / TMs  nl. Nares clear without erythema, swelling, mucoid exudates. Oral hygiene is good. No erythema, swelling, or exudate. Tongue normal, non-obstructing. Tonsils not swollen or erythematous. Hearing normal.  Neck: Supple, thyroid normal. No bruits, nodes or JVD. Respiratory: Respiratory effort normal.  BS equal and clear bilateral without rales, rhonci,  wheezing or stridor. Cardio: Heart sounds are normal with regular rate and rhythm and no murmurs, rubs or gallops. Peripheral pulses are normal and equal bilaterally without edema. No aortic or femoral bruits. Chest: symmetric with normal excursions and percussion.  Abdomen: Soft, with Nl bowel sounds. Nontender, no guarding, rebound, hernias, masses, or organomegaly.  Lymphatics: Non tender without lymphadenopathy.  Genitourinary: No hernias.Testes nl. DRE - prostate 2-3 (+) enlarges - smooth & boggy  w/o nodules. Musculoskeletal: Full ROM all peripheral extremities, joint stability, 5/5 strength, and normal gait. Skin: Warm and dry without rashes, lesions, cyanosis, clubbing or  ecchymosis.  Neuro: Cranial nerves intact, reflexes equal bilaterally. Normal muscle tone, no cerebellar symptoms. Sensation intact to touch, vibratory and Monofilament to the toes bilaterally. Pysch: Alert and oriented X 3 with normal affect, insight and judgment appropriate.   Assessment and Plan  1. Annual Preventative/Screening Exam    2. Essential hypertension  - EKG 12-Lead - [place], RETROPERITNL ABD,  LTD - Urinalysis, Routine w reflex microscopic - Microalbumin / creatinine urine ratio - CBC with Differential/Platelet - BASIC METABOLIC PANEL WITH GFR - Magnesium - TSH  3. Hyperlipidemia, mixed  - EKG 12-Lead - [place], RETROPERITNL ABD,  LTD -  Hepatic function panel - Lipid panel - TSH  4. Diabetes mellitus without complication ([place])  - EKG 12-Lead - HM DIABETES FOOT EXAM - LOW EXTREMITY NEUR EXAM DOCUM - Hemoglobin A1c - Insulin, random  5. Vitamin D deficiency  - VITAMIN D 25 Hydroxy  6. Screening for rectal cancer  - POC Hemoccult Bld/Stl   7. Prostate cancer screening  - Add Proscar for sx's of BPH and LUTS  - PSA  8. Gastroesophageal reflux disease    9. Screening for ischemic heart disease  - EKG 12-Lead  10. Screening for AAA (aortic abdominal aneurysm)  - [place],  RETROPERITNL ABD,  LTD  11. Medication management  - Urinalysis, Routine w reflex microscopic - Microalbumin / creatinine urine ratio - CBC with Differential/Platelet - BASIC METABOLIC PANEL WITH GFR - Hepatic function panel - Magnesium - Lipid panel - TSH - Hemoglobin A1c - Insulin, random - VITAMIN D 25 Hydroxy        Patient was counseled in prudent diet, weight control to achieve/maintain BMI less than 25, BP monitoring, regular exercise and medications as discussed.  Discussed med effects and SE's. Routine screening labs and tests as requested with regular follow-up as recommended. Over 40 minutes of exam, counseling, chart review and high complex critical decision making was performed

## 2016-08-05 NOTE — Patient Instructions (Signed)

## 2016-08-06 ENCOUNTER — Ambulatory Visit (INDEPENDENT_AMBULATORY_CARE_PROVIDER_SITE_OTHER): Payer: Medicare HMO | Admitting: Internal Medicine

## 2016-08-06 ENCOUNTER — Encounter: Payer: Self-pay | Admitting: Internal Medicine

## 2016-08-06 VITALS — BP 102/56 | HR 76 | Temp 97.0°F | Resp 16 | Ht 69.5 in | Wt 148.6 lb

## 2016-08-06 DIAGNOSIS — E559 Vitamin D deficiency, unspecified: Secondary | ICD-10-CM | POA: Diagnosis not present

## 2016-08-06 DIAGNOSIS — I10 Essential (primary) hypertension: Secondary | ICD-10-CM | POA: Diagnosis not present

## 2016-08-06 DIAGNOSIS — Z23 Encounter for immunization: Secondary | ICD-10-CM | POA: Diagnosis not present

## 2016-08-06 DIAGNOSIS — Z125 Encounter for screening for malignant neoplasm of prostate: Secondary | ICD-10-CM | POA: Diagnosis not present

## 2016-08-06 DIAGNOSIS — E119 Type 2 diabetes mellitus without complications: Secondary | ICD-10-CM | POA: Diagnosis not present

## 2016-08-06 DIAGNOSIS — Z0000 Encounter for general adult medical examination without abnormal findings: Secondary | ICD-10-CM

## 2016-08-06 DIAGNOSIS — K2100 Gastro-esophageal reflux disease with esophagitis, without bleeding: Secondary | ICD-10-CM

## 2016-08-06 DIAGNOSIS — Z0001 Encounter for general adult medical examination with abnormal findings: Secondary | ICD-10-CM

## 2016-08-06 DIAGNOSIS — Z136 Encounter for screening for cardiovascular disorders: Secondary | ICD-10-CM

## 2016-08-06 DIAGNOSIS — E782 Mixed hyperlipidemia: Secondary | ICD-10-CM

## 2016-08-06 DIAGNOSIS — Z1212 Encounter for screening for malignant neoplasm of rectum: Secondary | ICD-10-CM

## 2016-08-06 DIAGNOSIS — Z79899 Other long term (current) drug therapy: Secondary | ICD-10-CM | POA: Diagnosis not present

## 2016-08-06 LAB — CBC WITH DIFFERENTIAL/PLATELET
BASOS ABS: 77 {cells}/uL (ref 0–200)
Basophils Relative: 1 %
EOS PCT: 3 %
Eosinophils Absolute: 231 cells/uL (ref 15–500)
HCT: 43.6 % (ref 38.5–50.0)
Hemoglobin: 15 g/dL (ref 13.2–17.1)
LYMPHS PCT: 28 %
Lymphs Abs: 2156 cells/uL (ref 850–3900)
MCH: 32.4 pg (ref 27.0–33.0)
MCHC: 34.4 g/dL (ref 32.0–36.0)
MCV: 94.2 fL (ref 80.0–100.0)
MONOS PCT: 9 %
MPV: 10.8 fL (ref 7.5–12.5)
Monocytes Absolute: 693 cells/uL (ref 200–950)
NEUTROS ABS: 4543 {cells}/uL (ref 1500–7800)
Neutrophils Relative %: 59 %
PLATELETS: 314 10*3/uL (ref 140–400)
RBC: 4.63 MIL/uL (ref 4.20–5.80)
RDW: 13.7 % (ref 11.0–15.0)
WBC: 7.7 10*3/uL (ref 3.8–10.8)

## 2016-08-06 LAB — TSH: TSH: 0.5 m[IU]/L (ref 0.40–4.50)

## 2016-08-06 MED ORDER — FINASTERIDE 5 MG PO TABS: ORAL_TABLET | ORAL | 3 refills | Status: DC

## 2016-08-07 LAB — BASIC METABOLIC PANEL WITH GFR
BUN: 13 mg/dL (ref 7–25)
CHLORIDE: 103 mmol/L (ref 98–110)
CO2: 24 mmol/L (ref 20–31)
Calcium: 9.5 mg/dL (ref 8.6–10.3)
Creat: 1.15 mg/dL (ref 0.70–1.18)
GFR, Est African American: 74 mL/min (ref >=60)
GFR, Est Non African American: 64 mL/min (ref >=60)
GLUCOSE: 95 mg/dL (ref 65–99)
Potassium: 4.2 mmol/L (ref 3.5–5.3)
SODIUM: 138 mmol/L (ref 135–146)

## 2016-08-07 LAB — URINALYSIS, ROUTINE W REFLEX MICROSCOPIC
Bilirubin Urine: NEGATIVE
Glucose, UA: NEGATIVE
Hgb urine dipstick: NEGATIVE
Ketones, ur: NEGATIVE
LEUKOCYTES UA: NEGATIVE
NITRITE: NEGATIVE
PH: 6.5 (ref 5.0–8.0)
Protein, ur: NEGATIVE
SPECIFIC GRAVITY, URINE: 1.014 (ref 1.001–1.035)

## 2016-08-07 LAB — VITAMIN D 25 HYDROXY (VIT D DEFICIENCY, FRACTURES): VIT D 25 HYDROXY: 52 ng/mL (ref 30–100)

## 2016-08-07 LAB — MICROALBUMIN / CREATININE URINE RATIO
CREATININE, URINE: 104 mg/dL (ref 20–370)
MICROALB UR: 0.5 mg/dL
MICROALB/CREAT RATIO: 5 ug/mg{creat} (ref <30)

## 2016-08-07 LAB — LIPID PANEL
CHOL/HDL RATIO: 5.3 ratio — AB (ref <5.0)
Cholesterol: 165 mg/dL (ref <200)
HDL: 31 mg/dL — AB (ref >40)
LDL Cholesterol: 79 mg/dL (ref <100)
Triglycerides: 276 mg/dL — ABNORMAL HIGH (ref <150)
VLDL: 55 mg/dL — ABNORMAL HIGH (ref <30)

## 2016-08-07 LAB — HEMOGLOBIN A1C
Hgb A1c MFr Bld: 5.6 % (ref <5.7)
Mean Plasma Glucose: 114 mg/dL

## 2016-08-07 LAB — HEPATIC FUNCTION PANEL
ALK PHOS: 85 U/L (ref 40–115)
ALT: 12 U/L (ref 9–46)
AST: 14 U/L (ref 10–35)
Albumin: 4.3 g/dL (ref 3.6–5.1)
BILIRUBIN INDIRECT: 0.3 mg/dL (ref 0.2–1.2)
Bilirubin, Direct: 0.1 mg/dL (ref <=0.2)
Total Bilirubin: 0.4 mg/dL (ref 0.2–1.2)
Total Protein: 6.9 g/dL (ref 6.1–8.1)

## 2016-08-07 LAB — MAGNESIUM: Magnesium: 1.9 mg/dL (ref 1.5–2.5)

## 2016-08-07 LAB — PSA: PSA: 1.9 ng/mL (ref <=4.0)

## 2016-08-07 LAB — INSULIN, RANDOM: Insulin: 8.4 u[IU]/mL (ref 2.0–19.6)

## 2016-08-21 ENCOUNTER — Other Ambulatory Visit: Payer: Self-pay | Admitting: Internal Medicine

## 2016-10-14 ENCOUNTER — Encounter (HOSPITAL_COMMUNITY): Payer: Self-pay

## 2016-10-14 ENCOUNTER — Ambulatory Visit (HOSPITAL_COMMUNITY)
Admission: EM | Admit: 2016-10-14 | Discharge: 2016-10-14 | Disposition: A | Discharge status: Home / Self Care | Payer: Medicare HMO | Attending: Internal Medicine | Admitting: Internal Medicine

## 2016-10-14 ENCOUNTER — Ambulatory Visit (INDEPENDENT_AMBULATORY_CARE_PROVIDER_SITE_OTHER): Payer: Medicare HMO

## 2016-10-14 DIAGNOSIS — R059 Cough, unspecified: Secondary | ICD-10-CM

## 2016-10-14 DIAGNOSIS — R05 Cough: Secondary | ICD-10-CM | POA: Diagnosis not present

## 2016-10-14 DIAGNOSIS — R0981 Nasal congestion: Secondary | ICD-10-CM

## 2016-10-14 DIAGNOSIS — B349 Viral infection, unspecified: Secondary | ICD-10-CM | POA: Diagnosis not present

## 2016-10-14 DIAGNOSIS — R0989 Other specified symptoms and signs involving the circulatory and respiratory systems: Secondary | ICD-10-CM | POA: Diagnosis not present

## 2016-10-14 MED ORDER — ALBUTEROL SULFATE HFA 108 (90 BASE) MCG/ACT IN AERS: 2.0000 | INHALATION_SPRAY | RESPIRATORY_TRACT | 0 refills | Status: DC | PRN

## 2016-10-14 NOTE — ED Provider Notes (Addendum)
CSN: [id number]     Arrival date & time 10/14/16  1640 History   None    Chief Complaint  Patient presents with  . Nasal Congestion   (Consider location/radiation/quality/duration/timing/severity/associated sxs/prior Treatment) 71 year old Bush coming in by his wife presents with what he calls a chest congestion. Last night he had a temperature of 102. He is feeling weak having chills and cough starting 3 days ago. He is a smoker but has cut down recently. He denies shortness of breath or chest pain.      Past Medical History:  Diagnosis Date  . Diabetes mellitus without complication ([place])   . Hyperlipidemia   . Hypertension   . Vitamin D deficiency    History reviewed. No pertinent surgical history. Family History  Problem Relation Age of Onset  . Heart attack Mother   . Heart disease Mother   . Heart attack Brother   . Hypertension Brother   . Diabetes Sister   . Heart disease Sister   . Heart disease Father   . Heart attack Father    Social History  Substance Use Topics  . Smoking status: Current Every Day Smoker    Packs/day: 0.03    Years: 10.00    Types: Cigarettes  . Smokeless tobacco: Never Used     Comment: smokes 4-5 cigarettes a week and uses E-cigarette  . Alcohol use No     Comment: He states he no longer drinks alcohol    Review of Systems  Constitutional: Positive for activity change, chills and fever.  HENT: Positive for rhinorrhea. Negative for congestion and sore throat.   Respiratory: Positive for cough. Negative for chest tightness and shortness of breath.   Cardiovascular: Negative for chest pain and leg swelling.  Gastrointestinal: Negative.   Neurological: Negative.   All other systems reviewed and are negative.   Allergies  Ppd [tuberculin purified protein derivative]  Home Medications   Prior to Admission medications   Medication Sig Start Date End Date Taking? Authorizing Provider  aspirin 81 MG tablet Take 81 mg by mouth daily.    Yes [provider]  atorvastatin (LIPITOR) 40 MG tablet TAKE 1 TABLET EVERY DAY 01/11/16  Yes Unk Pinto, MD  Cholecalciferol (VITAMIN D) 2000 UNITS CAPS Take 1 capsule by mouth daily.   Yes [provider]  DILT-XR 240 MG 24 hr capsule TAKE 1 CAPSULE DAILY FOR BLOOD PRESSURE AND HEART BEAT 08/21/16  Yes Unk Pinto, MD  finasteride (PROSCAR) 5 MG tablet Take 1 tablet daily for Prostate 08/06/16 08/06/17 Yes Unk Pinto, MD  losartan (COZAAR) 100 MG tablet TAKE 1 TABLET EVERY DAY FOR BLOOD PRESSURE 07/19/16  Yes Unk Pinto, MD  magnesium gluconate (MAGONATE) 500 MG tablet Take 500 mg by mouth 2 (two) times daily.   Yes [provider]  metFORMIN (GLUCOPHAGE-XR) 500 MG 24 hr tablet TAKE 1 TABLET TWICE DAILY 02/21/16  Yes Vicie Mutters, PA-C  ranitidine (ZANTAC) 300 MG tablet Take 1 to tablet daily for heartburn & reflux 12/13/15  Yes Unk Pinto, MD   Meds Ordered and Administered this Visit  Medications - No data to display  BP 114/68 (BP Location: Right Arm)   Pulse 89   Temp 98.9 F (37.2 C) (Oral)   Resp 17   SpO2 96%  No data found.   Physical Exam  Constitutional: He is oriented to person, place, and time. He appears well-developed and well-nourished. No distress.  HENT:  Head: Normocephalic and atraumatic.  Mouth/Throat: No oropharyngeal  exudate.  Bilateral TMs are normal. Oropharynx moist, minor erythema, no swelling or exudate   Eyes: EOM are normal.  Neck: Normal range of motion. Neck supple.  Cardiovascular: Normal rate, regular rhythm, normal heart sounds and intact distal pulses.   Pulmonary/Chest:  Faint rhonchi in the right and left lower fields. No wheeze. No crackles. Good air movement.  Neurological: He is alert and oriented to person, place, and time.  Skin: Skin is warm and dry.  Nursing note and vitals reviewed.   Urgent Care Course     Procedures (including critical care time)  Labs Review Labs  Reviewed - No data to display  Imaging Review Dg Chest 2 View  Result Date: 10/14/2016 CLINICAL DATA:  Head and chest congestion with fever EXAM: CHEST  2 VIEW COMPARISON:  03/10/2014 FINDINGS: Diffuse interstitial opacity, similar compared to prior and likely due to chronic change. No acute consolidation or pleural effusion. Stable cardiomediastinal silhouette with atherosclerosis. Apical scarring. No pneumothorax. IMPRESSION: No acute infiltrates or focal consolidations. Electronically Signed   By: Donavan Foil M.D.   On: 10/14/2016 18:24     Visual Acuity Review  Right Eye Distance:   Left Eye Distance:   Bilateral Distance:    Right Eye Near:   Left Eye Near:    Bilateral Near:         MDM   1. Acute viral syndrome   2. Cough    The physical exam and chest x-ray does not show any source of bacterial infection. It is likely that you may have developed a virus. If you are getting worse, having consistent high fevers, feeling worse, shortness of breath then you may need to go to the emergency Department for additional evaluation. The following medications may help with your symptoms Sudafed PE 10 mg every 4 to 6 hours as needed for congestion Allegra or Zyrtec daily as needed for drainage and runny nose. For stronger antihistamine may take Chlor-Trimeton 2 to 4 mg every 4 to 6 hours, may cause drowsiness. Saline nasal spray used frequently. Drink plenty of fluids and stay well-hydrated. Flonase or Rhinocort nasal spray daily Meds ordered this encounter  Medications  . albuterol (PROVENTIL HFA;VENTOLIN HFA) 108 (90 Base) MCG/ACT inhaler    Sig: Inhale 2 puffs into the lungs every 4 (four) hours as needed for wheezing or shortness of breath.    Dispense:  1 Inhaler    Refill:  0    Order Specific Question:   Supervising Provider    Answer:   Sherlene Shams [[id number]]      Janne Napoleon, NP 10/14/16 1847    Janne Napoleon, NP 10/14/16 1849

## 2016-10-14 NOTE — ED Triage Notes (Signed)
Patient presents to [facility] with complaints of nasal congestion and cold symptoms x3 days patient also complains of headaches, chills,and nausea.

## 2016-10-14 NOTE — Discharge Instructions (Signed)
The physical exam and chest x-ray does not show any source of bacterial infection. It is likely that you may have developed a virus. If you are getting worse, having consistent high fevers, feeling worse, shortness of breath then you may need to go to the emergency Department for additional evaluation. The following medications may help with your symptoms Sudafed PE 10 mg every 4 to 6 hours as needed for congestion Allegra or Zyrtec daily as needed for drainage and runny nose. For stronger antihistamine may take Chlor-Trimeton 2 to 4 mg every 4 to 6 hours, may cause drowsiness. Saline nasal spray used frequently. Drink plenty of fluids and stay well-hydrated. Flonase or Rhinocort nasal spray daily

## 2016-10-25 ENCOUNTER — Ambulatory Visit (INDEPENDENT_AMBULATORY_CARE_PROVIDER_SITE_OTHER): Payer: Medicare HMO | Admitting: Internal Medicine

## 2016-10-25 ENCOUNTER — Encounter: Payer: Self-pay | Admitting: Internal Medicine

## 2016-10-25 VITALS — BP 102/64 | HR 84 | Temp 97.4°F | Resp 16 | Ht 69.5 in | Wt 140.0 lb

## 2016-10-25 DIAGNOSIS — J324 Chronic pansinusitis: Secondary | ICD-10-CM

## 2016-10-25 DIAGNOSIS — J0410 Acute tracheitis without obstruction: Secondary | ICD-10-CM

## 2016-10-25 MED ORDER — AZITHROMYCIN 250 MG PO TABS: ORAL_TABLET | ORAL | 1 refills | Status: DC

## 2016-10-25 MED ORDER — PREDNISONE 20 MG PO TABS: ORAL_TABLET | ORAL | 0 refills | Status: AC

## 2016-10-25 MED ORDER — PROMETHAZINE-DM 6.25-15 MG/5ML PO SYRP: ORAL_SOLUTION | ORAL | 1 refills | Status: DC

## 2016-10-25 NOTE — Progress Notes (Signed)
  Subjective:    Patient ID: Jesus Bush, male    DOB: 07-Jul-1945, 71 y.o.   MRN: [id number]  HPI  This nice 71 yo MWM presents with c/o Head & chest congestion and putrid sinus secretions & sputum. Denies fevers, chills, rash , wheezing or dyspnea.  Medication Sig  . albuterol (PROVENTIL HFA;VENTOLIN HFA) 108 (90 Base) MCG/ACT inhaler Inhale 2 puffs into the lungs every 4 (four) hours as needed for wheezing or shortness of breath.  Marland Kitchen aspirin 81 MG tablet Take 81 mg by mouth daily.  Marland Kitchen atorvastatin (LIPITOR) 40 MG tablet TAKE 1 TABLET EVERY DAY  . Cholecalciferol (VITAMIN D) 2000 UNITS CAPS Take 1 capsule by mouth daily.  Marland Kitchen DILT-XR 240 MG 24 hr capsule TAKE 1 CAPSULE DAILY FOR BLOOD PRESSURE AND HEART BEAT  . finasteride (PROSCAR) 5 MG tablet Take 1 tablet daily for Prostate  . losartan (COZAAR) 100 MG tablet TAKE 1 TABLET EVERY DAY FOR BLOOD PRESSURE  . magnesium gluconate (MAGONATE) 500 MG tablet Take 500 mg by mouth 2 (two) times daily.  . metFORMIN (GLUCOPHAGE-XR) 500 MG 24 hr tablet TAKE 1 TABLET TWICE DAILY  . ranitidine (ZANTAC) 300 MG tablet Take 1 to tablet daily for heartburn & reflux   No facility-administered medications prior to visit.    Allergies  Allergen Reactions  . Ppd [Tuberculin Purified Protein Derivative]     Positive PPD.   Past Medical History:  Diagnosis Date  . Diabetes mellitus without complication ([place])   . Hyperlipidemia   . Hypertension   . Vitamin D deficiency    Review of Systems  10 point systems review negative except as above.    Objective:   Physical Exam  BP 102/64   Pulse 84   Temp (!) 97.4 F (36.3 C)   Resp 16   Ht 5' 9.5" (1.765 m)   Wt 140 lb (63.5 kg)   BMI 20.38 kg/m   O2 sat = 95% Sl hoarse w/dry cough. No stridor.  HEENT - Eac's patent. TM's Nl. EOM's full. PERRLA. Sl frontal/maxillary tenderness. NasoOroPharynx clear. Neck - supple. Nl Thyroid. Carotids 2+ & No bruits, nodes, JVD Chest - Clear equal BS w/o few  scattered rales and no rhonchi or wheezes. Cor - Nl HS. RRR w/o sig MGR. MS- FROM. Gait Nl. Neuro - No obvious Cr N abnormalities.  Nl w/o focal abnormalities.    Assessment & Plan:   1. Tracheitis  2. Pansinusitis, unspecified chronicity  - predniSONE 20 MG tablet; 1 tab 3 x day for 2 days, then 1 tab 2 x day for 2 days, then 1 tab 1 x day for 3 days  Dispense: 13 tablet; - discussed high sugar precautions and to monitor CBG's next 2 weeks.   - azithromycin (ZITHROMAX) 250 MG tablet; Take 2 tablets (500 mg) on  Day 1,  followed by 1 tablet (250 mg) once daily on Days 2 through 5.  Dispense: 6 each; Refill: 1  - promethazine-dextromethorphan (PROMETHAZINE-DM) 6.25-15 MG/5ML syrup; Take 1 to 2 tsp enery 4 hours if needed for cough  Dispense: 360 mL; Refill: 1

## 2016-11-08 DIAGNOSIS — J449 Chronic obstructive pulmonary disease, unspecified: Secondary | ICD-10-CM | POA: Insufficient documentation

## 2016-11-08 NOTE — Progress Notes (Signed)
MEDICARE ANNUAL WELLNESS VISIT AND FOLLOW UP Assessment:  : Essential hypertension -     CBC with Differential/Platelet -     BASIC METABOLIC PANEL WITH GFR -     Hepatic function panel -     TSH - continue medications, DASH diet, exercise and monitor at home. Call if greater than 130/80.   Abnormal glucose -     Hemoglobin A1c Discussed general issues about diabetes pathophysiology and management., Educational material distributed., Suggested low cholesterol diet., Encouraged aerobic exercise., Discussed foot care., Reminded to get yearly retinal exam.  Mixed hyperlipidemia -     Lipid panel -continue medications, check lipids, decrease fatty foods, increase activity.   Medication management -     Magnesium  Tobacco use disorder -     buPROPion (WELLBUTRIN SR) 100 MG 12 hr tablet; Take 1 tablet (100 mg total) by mouth 2 (two) times daily. - Smoking cessation-  commended patient for trying to quit, will try wellbutrin, if this does not help will do chantix  Chronic obstructive pulmonary disease, unspecified COPD type ([name]) Advised to stop smoking, will get CXR, continue meds.   Gastroesophageal reflux disease, esophagitis presence not specified -     pantoprazole (PROTONIX) 40 MG tablet; Take 1 tablet (40 mg total) by mouth daily. - stop the metformin and see if this helps, if not take protonix QOD  Vitamin D deficiency Continue supplement  Encounter for Medicare annual wellness exam 1 year  Advanced care planning/counseling discussion Check with wife to see if she has the papers, fill out and bring back  Atherosclerosis of aorta ([name]) Control blood pressure, cholesterol, glucose, increase exercise.  Stop smoking -     Lipid panel -     Hemoglobin A1c   Over 30 minutes of exam, counseling, chart review, and critical decision making was performed Future Appointments Date Time Provider [place]  02/11/2017 9:30 AM Unk Pinto, MD [facility] None   09/09/2017 3:00 PM Unk Pinto, MD [facility] None    Plan:   During the course of the visit the patient was educated and counseled about appropriate screening and preventive services including:    Pneumococcal vaccine   Influenza vaccine  Prevnar 13  Td vaccine  Screening electrocardiogram  Colorectal cancer screening  Diabetes screening  Glaucoma screening  Nutrition counseling    Subjective:  Jesus Bush is a 71 y.o. male who presents for Medicare Annual Wellness Visit and 3 month follow up for HTN, hyperlipidemia, prediabetes, and vitamin D Def.   His blood pressure has been controlled at home, today their BP is BP: 116/70 He does not workout. He denies chest pain, shortness of breath, dizziness.  He has COPD, is a smoker, interested in quitting, smokes 3-4 cigs a day.  He has been having heart burn and states the zantac is not helping, no ETOH, rare aleve. He is on metformin, denies black stool.  He is on cholesterol medication and denies myalgias. His cholesterol is at goal. The cholesterol last visit was:   Lab Results  Component Value Date   CHOL 165 08/06/2016   HDL 31 (L) 08/06/2016   LDLCALC 79 08/06/2016   TRIG 276 (H) 08/06/2016   CHOLHDL 5.3 (H) 08/06/2016   He has been working on diet and exercise for prediabetes , and denies foot ulcerations, hyperglycemia, hypoglycemia , increased appetite, nausea, paresthesia of the feet, polydipsia, polyuria, visual disturbances, vomiting and weight loss. Last A1C in the office was:  Lab Results  Component  Value Date   HGBA1C 5.6 08/06/2016   Last GFR Lab Results  Component Value Date   GFRNONAA 64 08/06/2016    Patient is on Vitamin D supplement.   Lab Results  Component Value Date   VD25OH 52 08/06/2016     BMI is Body mass index is 21.11 kg/m., he is working on diet and exercise. Wt Readings from Last 3 Encounters:  11/09/16 145 lb (65.8 kg)  10/25/16 140 lb (63.5 kg)  08/06/16 148 lb 9.6  oz (67.4 kg)    Medication Review: Current Outpatient Prescriptions on File Prior to Visit  Medication Sig Dispense Refill  . albuterol (PROVENTIL HFA;VENTOLIN HFA) 108 (90 Base) MCG/ACT inhaler Inhale 2 puffs into the lungs every 4 (four) hours as needed for wheezing or shortness of breath. 1 Inhaler 0  . aspirin 81 MG tablet Take 81 mg by mouth daily.    Marland Kitchen azithromycin (ZITHROMAX) 250 MG tablet Take 2 tablets (500 mg) on  Day 1,  followed by 1 tablet (250 mg) once daily on Days 2 through 5. 6 each 1  . Cholecalciferol (VITAMIN D) 2000 UNITS CAPS Take 1 capsule by mouth daily.    Marland Kitchen DILT-XR 240 MG 24 hr capsule TAKE 1 CAPSULE DAILY FOR BLOOD PRESSURE AND HEART BEAT 90 capsule 1  . finasteride (PROSCAR) 5 MG tablet Take 1 tablet daily for Prostate 90 tablet 3  . losartan (COZAAR) 100 MG tablet TAKE 1 TABLET EVERY DAY FOR BLOOD PRESSURE 90 tablet 1  . magnesium gluconate (MAGONATE) 500 MG tablet Take 500 mg by mouth 2 (two) times daily.    . metFORMIN (GLUCOPHAGE-XR) 500 MG 24 hr tablet TAKE 1 TABLET TWICE DAILY 180 tablet 3  . ranitidine (ZANTAC) 300 MG tablet Take 1 to tablet daily for heartburn & reflux 90 tablet 1   No current facility-administered medications on file prior to visit.     Current Problems (verified) Patient Active Problem List   Diagnosis Date Noted  . COPD (chronic obstructive pulmonary disease) ([place]) 11/08/2016  . Encounter for general adult medical examination with abnormal findings 12/13/2015  . Esophageal reflux 04/25/2015  . Tobacco use disorder 07/23/2014  . Medication management 04/13/2014  . Essential hypertension 02/11/2013  . Mixed hyperlipidemia 02/11/2013  . Vitamin D deficiency 02/11/2013  . Abnormal glucose 05/12/2012    Screening Tests Immunization History  Administered Date(s) Administered  . DTaP 05/23/2006  . Influenza Whole 12/16/2012  . Influenza, High Dose Seasonal PF 01/17/2015, 12/13/2015  . Influenza-Unspecified 12/27/2013  .  Pneumococcal Conjugate-13 04/13/2014  . Pneumococcal Polysaccharide-23 09/13/2008, 08/06/2016  . Td 08/06/2016  . Zoster 04/17/2013    Preventative care: Last colonoscopy: 2009 due next year CXR 2018 he does smoker very little  Prior vaccinations: TD or Tdap: 2018 Influenza: 2017 Pneumococcal: 2010 Prevnar13: 2016 Shingles/Zostavax: 2015  Names of Other Physician/Practitioners you currently use: 1. [facility] here for primary care 2. Dr. Einar Gip, eye doctor, last visit 08/2016 3. Does not see, dentures, dentist, last visit  Patient Care Team: Unk Pinto, MD as PCP - General (Internal Medicine) Unk Pinto, MD (Internal Medicine) Inda Castle, MD as Consulting Physician (Gastroenterology)  Allergies Allergies  Allergen Reactions  . Ppd [Tuberculin Purified Protein Derivative]     Positive PPD.    SURGICAL HISTORY He  has no past surgical history on file. FAMILY HISTORY His family history includes Diabetes in his sister; Heart attack in his brother, father, and mother; Heart disease in his  father, mother, and sister; Hypertension in his brother. SOCIAL HISTORY He  reports that he has been smoking Cigarettes.  He has a 0.30 pack-year smoking history. He has never used smokeless tobacco. He reports that he does not drink alcohol or use drugs.  MEDICARE WELLNESS OBJECTIVES: Physical activity:   Cardiac risk factors:   Depression/mood screen:   Depression screen [facility] 2/9 08/06/2016  Decreased Interest 0  Down, Depressed, Hopeless 0  PHQ - 2 Score 0    ADLs:  In your present state of health, do you have any difficulty performing the following activities: 08/06/2016 08/06/2016  Hearing? N N  Vision? N N  Difficulty concentrating or making decisions? N N  Walking or climbing stairs? N N  Dressing or bathing? N N  Doing errands, shopping? N N  Some recent data might be hidden     Cognitive Testing  Alert? Yes   Normal Appearance?Yes  Oriented to person? Yes  Place? Yes   Time? Yes  Recall of three objects?  Yes  Can perform simple calculations? Yes  Displays appropriate judgment?Yes  Can read the correct time from a watch face?Yes  EOL planning: Does Patient Have a Medical Advance Directive?: No Would patient like information on creating a medical advance directive?: No - Patient declined   Objective:   Today's Vitals   11/09/16 1014  BP: 116/70  Pulse: 74  Resp: 14  Temp: 98.1 F (36.7 C)  SpO2: 97%  Weight: 145 lb (65.8 kg)  Height: 5' 9.5" (1.765 m)  PainSc: 0-No pain   Body mass index is 21.11 kg/m.  General appearance: Thin and ill appearing, alert, no distress, WD/WN, male HEENT: normocephalic, sclerae anicteric, TMs pearly, nares patent, no discharge or erythema, pharynx normal Oral cavity: MMM, no lesions Neck: supple, no lymphadenopathy, no thyromegaly, no masses Heart: RRR, normal S1, S2, no murmurs Lungs: CTA bilaterally, no rhonchi, or rales  Wheezes throughout Abdomen: +bs, soft, non tender, non distended, no masses, no hepatomegaly, no splenomegaly Musculoskeletal: nontender, no swelling, no obvious deformity Extremities: no edema, no cyanosis, no clubbing Pulses: 2+ symmetric, upper and lower extremities, normal cap refill Neurological: alert, oriented x 3, CN2-12 intact, strength normal upper extremities and lower extremities, sensation normal throughout, DTRs 2+ throughout, no cerebellar signs, gait normal Psychiatric: normal affect, behavior normal, pleasant   Medicare Attestation I have personally reviewed: The patient's medical and social history Their use of alcohol, tobacco or illicit drugs Their current medications and supplements The patient's functional ability including ADLs,fall risks, home safety risks, cognitive, and hearing and visual impairment Diet and physical activities Evidence for depression or mood disorders  The patient's weight,  height, BMI, and visual acuity have been recorded in the chart.  I have made referrals, counseling, and provided education to the patient based on review of the above and I have provided the patient with a written personalized care plan for preventive services.     Vicie Mutters, PA-C   11/09/2016

## 2016-11-09 ENCOUNTER — Ambulatory Visit (INDEPENDENT_AMBULATORY_CARE_PROVIDER_SITE_OTHER): Payer: Medicare HMO | Admitting: Physician Assistant

## 2016-11-09 ENCOUNTER — Encounter: Payer: Self-pay | Admitting: Physician Assistant

## 2016-11-09 VITALS — BP 116/70 | HR 74 | Temp 98.1°F | Resp 14 | Ht 69.5 in | Wt 145.0 lb

## 2016-11-09 DIAGNOSIS — K219 Gastro-esophageal reflux disease without esophagitis: Secondary | ICD-10-CM | POA: Diagnosis not present

## 2016-11-09 DIAGNOSIS — F17200 Nicotine dependence, unspecified, uncomplicated: Secondary | ICD-10-CM | POA: Diagnosis not present

## 2016-11-09 DIAGNOSIS — Z79899 Other long term (current) drug therapy: Secondary | ICD-10-CM

## 2016-11-09 DIAGNOSIS — Z0000 Encounter for general adult medical examination without abnormal findings: Secondary | ICD-10-CM | POA: Diagnosis not present

## 2016-11-09 DIAGNOSIS — E782 Mixed hyperlipidemia: Secondary | ICD-10-CM

## 2016-11-09 DIAGNOSIS — I10 Essential (primary) hypertension: Secondary | ICD-10-CM

## 2016-11-09 DIAGNOSIS — J449 Chronic obstructive pulmonary disease, unspecified: Secondary | ICD-10-CM | POA: Diagnosis not present

## 2016-11-09 DIAGNOSIS — Z7189 Other specified counseling: Secondary | ICD-10-CM | POA: Diagnosis not present

## 2016-11-09 DIAGNOSIS — R7309 Other abnormal glucose: Secondary | ICD-10-CM | POA: Diagnosis not present

## 2016-11-09 DIAGNOSIS — I700 Atherosclerosis of aorta: Secondary | ICD-10-CM | POA: Diagnosis not present

## 2016-11-09 DIAGNOSIS — E559 Vitamin D deficiency, unspecified: Secondary | ICD-10-CM | POA: Diagnosis not present

## 2016-11-09 MED ORDER — PANTOPRAZOLE SODIUM 40 MG PO TBEC: 40.0000 mg | DELAYED_RELEASE_TABLET | Freq: Every day | ORAL | 1 refills | Status: DC

## 2016-11-09 MED ORDER — BUPROPION HCL ER (SR) 100 MG PO TB12: 100.0000 mg | ORAL_TABLET | Freq: Two times a day (BID) | ORAL | 2 refills | Status: DC

## 2016-11-09 NOTE — Patient Instructions (Addendum)
STOP THE METFORMIN THIS CAN CAUSE THE STOMACH ISSUES AND JUST DO THE ZANTAC, GIVE IT 2 WEEKS, IF IT IS STILL BOTHERING YOU DO THE PROTONIX EVERY OTHER DAY  Continue the zantac daily but can try protonix every other day  If any black stool, blood in stool, worsening stomach pain call the office or go to ER Can refer to GI doctor   GETTING OFF OF PPI's    Nexium/protonix/prilosec/Omeprazole/Dexilant/Aciphex are called PPI's, they are great at healing your stomach but should only be taken for a short period of time.     Recent studies have shown that taken for a long time they  can increase the risk of osteoporosis (weakening of your bones), pneumonia, low magnesium, restless legs, Cdiff (infection that causes diarrhea), DEMENTIA and most recently kidney damage / disease / insufficiency.     Due to this information we want to try to stop the PPI but if you try to stop it abruptly this can cause rebound acid and worsening symptoms.  - Avoid alcohol, spicy foods, NSAIDS (aleve, ibuprofen) at this time. See foods below.   +++++++++++++++++++++++++++++++++++++++++++  Food Choices for Gastroesophageal Reflux Disease  When you have gastroesophageal reflux disease (GERD), the foods you eat and your eating habits are very important. Choosing the right foods can help ease the discomfort of GERD. WHAT GENERAL GUIDELINES DO I NEED TO FOLLOW?  Choose fruits, vegetables, whole grains, low-fat dairy products, and low-fat meat, fish, and poultry.  Limit fats such as oils, salad dressings, butter, nuts, and avocado.  Keep a food diary to identify foods that cause symptoms.  Avoid foods that cause reflux. These may be different for different people.  Eat frequent small meals instead of three large meals each day.  Eat your meals slowly, in a relaxed setting.  Limit fried foods.  Cook foods using methods other than frying.  Avoid drinking alcohol.  Avoid drinking large amounts of liquids with  your meals.  Avoid bending over or lying down until 2-3 hours after eating.   WHAT FOODS ARE NOT RECOMMENDED? The following are some foods and drinks that may worsen your symptoms:  Vegetables Tomatoes. Tomato juice. Tomato and spaghetti sauce. Chili peppers. Onion and garlic. Horseradish. Fruits Oranges, grapefruit, and lemon (fruit and juice). Meats High-fat meats, fish, and poultry. This includes hot dogs, ribs, ham, sausage, salami, and bacon. Dairy Whole milk and chocolate milk. Sour cream. Cream. Butter. Ice cream. Cream cheese.  Beverages Coffee and tea, with or without caffeine. Carbonated beverages or energy drinks. Condiments Hot sauce. Barbecue sauce.  Sweets/Desserts Chocolate and cocoa. Donuts. Peppermint and spearmint. Fats and Oils High-fat foods, including [place] fries and potato chips. Other Vinegar. Strong spices, such as black pepper, white pepper, red pepper, cayenne, curry powder, cloves, ginger, and chili powder. Nexium/protonix/prilosec are called PPI's, they are great at healing your stomach but should only be taken for a short period of time.    Check with your wife if you have the advanced directive and bring them in to scan if you do  Start the wellbutrin 1 pill a day in the Am, can go to two a day if this does not help we will try chantix  If you have a smart phone, please look up Smoke Free app, this will help you stay on track and give you information about money you have saved, life that you have gained back and a ton of more information.   We are giving you chantix for smoking cessation.  You can do it! And we are here to help! You may have heard some scary side effects about chantix, the three most common I hear about are nausea, crazy dreams and depression.  However, I like for my patients to try to stay on 1/2 a tablet twice a day rather than one tablet twice a day as normally prescribed. This helps decrease the chances of side effects and helps  save money by making a one month prescription last two months  Please start the prescription this way:  Start 1/2 tablet by mouth once daily after food with a full glass of water for 3 days Then do 1/2 tablet by mouth twice daily for 4 days. During this first week you can smoke, but try to stop after this week.  At this point we have several options: 1) continue on 1/2 tablet twice a day- which I encourage you to do. You can stay on this dose the rest of the time on the medication or if you still feel the need to smoke you can do one of the two options below. 2) do one tablet in the morning and 1/2 in the evening which helps decrease dreams. 3) do one tablet twice a day.   What if I miss a dose? If you miss a dose, take it as soon as you can. If it is almost time for your next dose, take only that dose. Do not take double or extra doses.  What should I watch for while using this medicine? Visit your doctor or health care professional for regular check ups. Ask for ongoing advice and encouragement from your doctor or healthcare professional, friends, and family to help you quit. If you smoke while on this medication, quit again  Your mouth may get dry. Chewing sugarless gum or hard candy, and drinking plenty of water may help. Contact your doctor if the problem does not go away or is severe.  You may get drowsy or dizzy. Do not drive, use machinery, or do anything that needs mental alertness until you know how this medicine affects you. Do not stand or sit up quickly, especially if you are an older patient.   The use of this medicine may increase the chance of suicidal thoughts or actions. Pay special attention to how you are responding while on this medicine. Any worsening of mood, or thoughts of suicide or dying should be reported to your health care professional right away.  ADVANTAGES OF QUITTING SMOKING  Within 20 minutes, blood pressure decreases. Your pulse is at normal level.  After  8 hours, carbon monoxide levels in the blood return to normal. Your oxygen level increases.  After 24 hours, the chance of having a heart attack starts to decrease. Your breath, hair, and body stop smelling like smoke.  After 48 hours, damaged nerve endings begin to recover. Your sense of taste and smell improve.  After 72 hours, the body is virtually free of nicotine. Your bronchial tubes relax and breathing becomes easier.  After 2 to 12 weeks, lungs can hold more air. Exercise becomes easier and circulation improves.  After 1 year, the risk of coronary heart disease is cut in half.  After 5 years, the risk of stroke falls to the same as a nonsmoker.  After 10 years, the risk of lung cancer is cut in half and the risk of other cancers decreases significantly.  After 15 years, the risk of coronary heart disease drops, usually to the level of a  nonsmoker.  You will have extra money to spend on things other than cigarettes.

## 2016-11-10 LAB — HEPATIC FUNCTION PANEL
AG Ratio: 1.6 (calc) (ref 1.0–2.5)
ALBUMIN MSPROF: 4.1 g/dL (ref 3.6–5.1)
ALT: 40 U/L (ref 9–46)
AST: 26 U/L (ref 10–35)
Alkaline phosphatase (APISO): 96 U/L (ref 40–115)
BILIRUBIN TOTAL: 0.4 mg/dL (ref 0.2–1.2)
Bilirubin, Direct: 0.1 mg/dL (ref 0.0–0.2)
Globulin: 2.5 g/dL (calc) (ref 1.9–3.7)
Indirect Bilirubin: 0.3 mg/dL (calc) (ref 0.2–1.2)
TOTAL PROTEIN: 6.6 g/dL (ref 6.1–8.1)

## 2016-11-10 LAB — CBC WITH DIFFERENTIAL/PLATELET
Basophils Absolute: 80 cells/uL (ref 0–200)
Basophils Relative: 0.9 %
EOS PCT: 1.8 %
Eosinophils Absolute: 160 cells/uL (ref 15–500)
HEMATOCRIT: 41.6 % (ref 38.5–50.0)
HEMOGLOBIN: 14.1 g/dL (ref 13.2–17.1)
LYMPHS ABS: 1602 {cells}/uL (ref 850–3900)
MCH: 32.3 pg (ref 27.0–33.0)
MCHC: 33.9 g/dL (ref 32.0–36.0)
MCV: 95.4 fL (ref 80.0–100.0)
MPV: 10.8 fL (ref 7.5–12.5)
Monocytes Relative: 8.1 %
NEUTROS ABS: 6337 {cells}/uL (ref 1500–7800)
Neutrophils Relative %: 71.2 %
Platelets: 374 10*3/uL (ref 140–400)
RBC: 4.36 10*6/uL (ref 4.20–5.80)
RDW: 12.8 % (ref 11.0–15.0)
Total Lymphocyte: 18 %
WBC mixed population: 721 cells/uL (ref 200–950)
WBC: 8.9 10*3/uL (ref 3.8–10.8)

## 2016-11-10 LAB — MAGNESIUM: Magnesium: 1.7 mg/dL (ref 1.5–2.5)

## 2016-11-10 LAB — BASIC METABOLIC PANEL WITH GFR
BUN: 12 mg/dL (ref 7–25)
CALCIUM: 9.7 mg/dL (ref 8.6–10.3)
CO2: 27 mmol/L (ref 20–32)
CREATININE: 1.07 mg/dL (ref 0.70–1.18)
Chloride: 100 mmol/L (ref 98–110)
GFR, EST NON AFRICAN AMERICAN: 69 mL/min/{1.73_m2} (ref >=60)
GFR, Est African American: 81 mL/min/{1.73_m2} (ref >=60)
Glucose, Bld: 81 mg/dL (ref 65–99)
POTASSIUM: 4.6 mmol/L (ref 3.5–5.3)
Sodium: 137 mmol/L (ref 135–146)

## 2016-11-10 LAB — LIPID PANEL
CHOLESTEROL: 212 mg/dL — AB (ref <200)
HDL: 35 mg/dL — AB (ref >40)
LDL Cholesterol (Calc): 126 mg/dL (calc) — ABNORMAL HIGH
NON-HDL CHOLESTEROL (CALC): 177 mg/dL — AB (ref <130)
TRIGLYCERIDES: 358 mg/dL — AB (ref <150)
Total CHOL/HDL Ratio: 6.1 (calc) — ABNORMAL HIGH (ref <5.0)

## 2016-11-10 LAB — TSH: TSH: 0.45 mIU/L (ref 0.40–4.50)

## 2016-11-10 LAB — HEMOGLOBIN A1C
EAG (MMOL/L): 7.1 (calc)
Hgb A1c MFr Bld: 6.1 % of total Hgb — ABNORMAL HIGH (ref <5.7)
Mean Plasma Glucose: 128 (calc)

## 2016-11-12 ENCOUNTER — Telehealth: Payer: Self-pay

## 2016-11-12 ENCOUNTER — Other Ambulatory Visit: Payer: Self-pay | Admitting: Physician Assistant

## 2016-11-12 MED ORDER — ATORVASTATIN CALCIUM 20 MG PO TABS: 20.0000 mg | ORAL_TABLET | Freq: Every day | ORAL | 3 refills | Status: DC

## 2016-11-12 NOTE — Telephone Encounter (Signed)
Sent in lipitor 20mg , don't know when or how he got off of it but he can go back on it but stay off metformin for now [place]

## 2016-11-12 NOTE — Telephone Encounter (Signed)
-----   Message from Vicie Mutters, [place] sent at 11/12/2016  1:27 PM EDT ----- Regarding: RE: concerns Wanted him to just stop the metformin, he is only taking one a day and it can cause the stomach pain he is having. Can get back on it if his stomach does not get better.  He should continue all other medications and I don't have that he is on a cholesterol med but only med to stop is the metformin.  Smoking is worse than the wellbutrin and the wellbutrin was temporary to see if it helped with stopping, can discuss next OV. Can bring in daughter next OV if he wishes [place]ill [place] ----- Message ----- From: Elenor Quinones, CMA Sent: 11/12/2016  11:53 AM To: Vicie Mutters, PA-C Subject: concerns                                       Daughter's  in-law who is a nurse advised pt to not take Wellbutrin due to side efforts, so the pt is uncomfortable with taking this med.   Pt's wife had concerns about the pt being told to stop his metformin & cholesterol pills. Was this correct per pt's wife.  Please advise.

## 2016-11-12 NOTE — Progress Notes (Signed)
Sent in lipitor ?

## 2016-11-12 NOTE — Telephone Encounter (Signed)
Pt's wife reports pt was taking Lipitor at one point & states that if his cholesterol is so bad then he should really be on one. Please advise?

## 2016-11-12 NOTE — Telephone Encounter (Signed)
Spoke with pt's wife & informed pt's wife of message.

## 2016-11-13 ENCOUNTER — Other Ambulatory Visit: Payer: Self-pay

## 2016-11-13 MED ORDER — ATORVASTATIN CALCIUM 20 MG PO TABS: 20.0000 mg | ORAL_TABLET | Freq: Every day | ORAL | 0 refills | Status: DC

## 2016-11-13 NOTE — Telephone Encounter (Signed)
Spoke with pt's wife to inform her that Lipitor has been sent to pharmacy.

## 2016-12-03 ENCOUNTER — Other Ambulatory Visit: Payer: Self-pay | Admitting: Internal Medicine

## 2016-12-03 MED ORDER — AZITHROMYCIN 250 MG PO TABS: ORAL_TABLET | ORAL | 0 refills | Status: DC

## 2016-12-03 MED ORDER — PROMETHAZINE-DM 6.25-15 MG/5ML PO SYRP: ORAL_SOLUTION | ORAL | 1 refills | Status: DC

## 2016-12-03 MED ORDER — PREDNISONE 20 MG PO TABS: ORAL_TABLET | ORAL | 0 refills | Status: DC

## 2017-01-21 ENCOUNTER — Other Ambulatory Visit: Payer: Self-pay | Admitting: Internal Medicine

## 2017-01-21 MED ORDER — AZITHROMYCIN 250 MG PO TABS: ORAL_TABLET | ORAL | 0 refills | Status: DC

## 2017-01-29 ENCOUNTER — Encounter: Payer: Self-pay | Admitting: Physician Assistant

## 2017-01-29 ENCOUNTER — Ambulatory Visit: Payer: Medicare HMO | Admitting: Physician Assistant

## 2017-01-29 VITALS — BP 118/68 | HR 75 | Temp 98.6°F | Resp 16 | Ht 69.5 in | Wt 148.0 lb

## 2017-01-29 DIAGNOSIS — Z23 Encounter for immunization: Secondary | ICD-10-CM

## 2017-01-29 DIAGNOSIS — M542 Cervicalgia: Secondary | ICD-10-CM | POA: Diagnosis not present

## 2017-01-29 MED ORDER — MELOXICAM 15 MG PO TABS: ORAL_TABLET | ORAL | 1 refills | Status: DC

## 2017-01-29 MED ORDER — DEXAMETHASONE SODIUM PHOSPHATE 100 MG/10ML IJ SOLN
10.0000 mg | Freq: Once | INTRAMUSCULAR | Status: AC
  Administered 2017-01-29: 10 mg via INTRAMUSCULAR

## 2017-01-29 MED ORDER — BACLOFEN 10 MG PO TABS: 10.0000 mg | ORAL_TABLET | Freq: Two times a day (BID) | ORAL | 1 refills | Status: DC

## 2017-01-29 NOTE — Patient Instructions (Signed)
Try the exercises and other information in the [facility] care manual, meloxicam once during the day as needed (avoid taking other NSAIDS like Alleve or Ibuprofen while taking this) and then baclofen if needed at bedtime for muscle spasm. This can be taken up to every 8 hours, but causes sedation, so should not drive or operate heavy machinery while taking this medicine.   If you are not better in 1-3 month we will refer you to ortho   Cervical Sprain A cervical sprain is a stretch or tear in one or more of the tough, cord-like tissues that connect bones (ligaments) in the neck. Cervical sprains can range from mild to severe. Severe cervical sprains can cause the spinal bones (vertebrae) in the neck to be unstable. This can lead to spinal cord damage and can result in serious nervous system problems. The amount of time that it takes for a cervical sprain to get better depends on the cause and extent of the injury. Most cervical sprains heal in 4-6 weeks. What are the causes? Cervical sprains may be caused by an injury (trauma), such as from a motor vehicle accident, a fall, or sudden forward and backward whipping movement of the head and neck (whiplash injury). Mild cervical sprains may be caused by wear and tear over time, such as from poor posture, sitting in a chair that does not provide support, or looking up or down for long periods of time. What increases the risk? The following factors may make you more likely to develop this condition:  Participating in activities that have a high risk of trauma to the neck. These include contact sports, auto racing, gymnastics, and diving.  Taking risks when driving or riding in a motor vehicle, such as speeding.  Having osteoarthritis of the spine.  Having poor strength and flexibility of the neck.  A previous neck injury.  Having poor posture.  Spending a lot of time in certain positions that put stress on the neck, such as sitting at a computer for  long periods of time.  What are the signs or symptoms? Symptoms of this condition include:  Pain, soreness, stiffness, tenderness, swelling, or a burning sensation in the front, back, or sides of the neck.  Sudden tightening of neck muscles that you cannot control (muscle spasms).  Pain in the shoulders or upper back.  Limited ability to move the neck.  Headache.  Dizziness.  Nausea.  Vomiting.  Weakness, numbness, or tingling in a hand or an arm.  Symptoms may develop right away after injury, or they may develop over a few days. In some cases, symptoms may go away with treatment and return (recur) over time. How is this diagnosed? This condition may be diagnosed based on:  Your medical history.  Your symptoms.  Any recent injuries or known neck problems that you have, such as arthritis in the neck.  A physical exam.  Imaging tests, such as: ? X-rays. ? MRI. ? CT scan.  How is this treated? This condition is treated by resting and icing the injured area and doing physical therapy exercises. Depending on the severity of your condition, treatment may also include:  Keeping your neck in place (immobilized) for periods of time. This may be done using: ? A cervical collar. This supports your chin and the back of your head. ? A cervical traction device. This is a sling that holds up your head. This removes weight and pressure from your neck, and it may help to relieve pain.  Medicines that help to relieve pain and inflammation.  Medicines that help to relax your muscles (muscle relaxants).  Surgery. This is rare.  Follow these instructions at home: If you have a cervical collar:  Wear it as told by your health care provider. Do not remove the collar unless instructed by your health care provider.  Ask your health care provider before you make any adjustments to your collar.  If you have long hair, keep it outside of the collar.  Ask your health care provider if  you can remove the collar for cleaning and bathing. If you are allowed to remove the collar for cleaning or bathing: ? Follow instructions from your health care provider about how to remove the collar safely. ? Clean the collar by wiping it with mild soap and water and drying it completely. ? If your collar has removable pads, remove them every 1-2 days and wash them by hand with soap and water. Let them air-dry completely before you put them back in the collar. ? Check your skin under the collar for irritation or sores. If you see any, tell your health care provider. Managing pain, stiffness, and swelling  If directed, use a cervical traction device as told by your health care provider.  If directed, apply heat to the affected area before you do your physical therapy or as often as told by your health care provider. Use the heat source that your health care provider recommends, such as a moist heat pack or a heating pad. ? Place a towel between your skin and the heat source. ? Leave the heat on for 20-30 minutes. ? Remove the heat if your skin turns bright red. This is especially important if you are unable to feel pain, heat, or cold. You may have a greater risk of getting burned.  If directed, put ice on the affected area: ? Put ice in a plastic bag. ? Place a towel between your skin and the bag. ? Leave the ice on for 20 minutes, 2-3 times a day. Activity  Do not drive while wearing a cervical collar. If you do not have a cervical collar, ask your health care provider if it is safe to drive while your neck heals.  Do not drive or use heavy machinery while taking prescription pain medicine or muscle relaxants, unless your health care provider approves.  Do not lift anything that is heavier than 10 lb (4.5 kg) until your health care provider tells you that it is safe.  Rest as directed by your health care provider. Avoid positions and activities that make your symptoms worse. Ask your  health care provider what activities are safe for you.  If physical therapy was prescribed, do exercises as told by your health care provider or physical therapist. General instructions  Take over-the-counter and prescription medicines only as told by your health care provider.  Do not use any products that contain nicotine or tobacco, such as cigarettes and e-cigarettes. These can delay healing. If you need help quitting, ask your health care provider.  Keep all follow-up visits as told by your health care provider or physical therapist. This is important. How is this prevented? To prevent a cervical sprain from happening again:  Use and maintain good posture. Make any needed adjustments to your workstation to help you use good posture.  Exercise regularly as directed by your health care provider or physical therapist.  Avoid risky activities that may cause a cervical sprain.  Contact a health  care provider if:  You have symptoms that get worse or do not get better after 2 weeks of treatment.  You have pain that gets worse or does not get better with medicine.  You develop new, unexplained symptoms.  You have sores or irritated skin on your neck from wearing your cervical collar. Get help right away if:  You have severe pain.  You develop numbness, tingling, or weakness in any part of your body.  You cannot move a part of your body (you have paralysis).  You have neck pain along with: ? Severe dizziness. ? Headache. Summary  A cervical sprain is a stretch or tear in one or more of the tough, cord-like tissues that connect bones (ligaments) in the neck.  Cervical sprains may be caused by an injury (trauma), such as from a motor vehicle accident, a fall, or sudden forward and backward whipping movement of the head and neck (whiplash injury).  Symptoms may develop right away after injury, or they may develop over a few days.  This condition is treated by resting and icing  the injured area and doing physical therapy exercises. This information is not intended to replace advice given to you by your health care provider. Make sure you discuss any questions you have with your health care provider. Document Released: 01/07/2007 Document Revised: 11/09/2015 Document Reviewed: 11/09/2015 Elsevier Interactive Patient Education  2017 [employer]   Neck Exercises Neck exercises can be important for many reasons:  They can help you to improve and maintain flexibility in your neck. This can be especially important as you age.  They can help to make your neck stronger. This can make movement easier.  They can reduce or prevent neck pain.  They may help your upper back.  Ask your health care provider which neck exercises would be best for you. Exercises Neck Press Repeat this exercise 10 times. Do it first thing in the morning and right before bed or as told by your health care provider. 1. Lie on your back on a firm bed or on the floor with a pillow under your head. 2. Use your neck muscles to push your head down on the pillow and straighten your spine. 3. Hold the position as well as you can. Keep your head facing up and your chin tucked. 4. Slowly count to 5 while holding this position. 5. Relax for a few seconds. Then repeat.  Isometric Strengthening Do a full set of these exercises 2 times a day or as told by your health care provider. 1. Sit in a supportive chair and place your hand on your forehead. 2. Push forward with your head and neck while pushing back with your hand. Hold for 10 seconds. 3. Relax. Then repeat the exercise 3 times. 4. Next, do thesequence again, this time putting your hand against the back of your head. Use your head and neck to push backward against the hand pressure. 5. Finally, do the same exercise on either side of your head, pushing sideways against the pressure of your hand.  Prone Head Lifts Repeat this exercise 5 times.  Do this 2 times a day or as told by your health care provider. 1. Lie face-down, resting on your elbows so that your chest and upper back are raised. 2. Start with your head facing downward, near your chest. Position your chin either on or near your chest. 3. Slowly lift your head upward. Lift until you are looking straight ahead. Then continue lifting your  head as far back as you can stretch. 4. Hold your head up for 5 seconds. Then slowly lower it to your starting position.  Supine Head Lifts Repeat this exercise 8-10 times. Do this 2 times a day or as told by your health care provider. 1. Lie on your back, bending your knees to point to the ceiling and keeping your feet flat on the floor. 2. Lift your head slowly off the floor, raising your chin toward your chest. 3. Hold for 5 seconds. 4. Relax and repeat.  Scapular Retraction Repeat this exercise 5 times. Do this 2 times a day or as told by your health care provider. 1. Stand with your arms at your sides. Look straight ahead. 2. Slowly pull both shoulders backward and downward until you feel a stretch between your shoulder blades in your upper back. 3. Hold for 10-30 seconds. 4. Relax and repeat.  Contact a health care provider if:  Your neck pain or discomfort gets much worse when you do an exercise.  Your neck pain or discomfort does not improve within 2 hours after you exercise. If you have any of these problems, stop exercising right away. Do not do the exercises again unless your health care provider says that you can. Get help right away if:  You develop sudden, severe neck pain. If this happens, stop exercising right away. Do not do the exercises again unless your health care provider says that you can. Exercises Neck Stretch  Repeat this exercise 3-5 times. 1. Do this exercise while standing or while sitting in a chair. 2. Place your feet flat on the floor, shoulder-width apart. 3. Slowly turn your head to the right.  Turn it all the way to the right so you can look over your right shoulder. Do not tilt or tip your head. 4. Hold this position for 10-30 seconds. 5. Slowly turn your head to the left, to look over your left shoulder. 6. Hold this position for 10-30 seconds.  Neck Retraction Repeat this exercise 8-10 times. Do this 3-4 times a day or as told by your health care provider. 1. Do this exercise while standing or while sitting in a sturdy chair. 2. Look straight ahead. Do not bend your neck. 3. Use your fingers to push your chin backward. Do not bend your neck for this movement. Continue to face straight ahead. If you are doing the exercise properly, you will feel a slight sensation in your throat and a stretch at the back of your neck. 4. Hold the stretch for 1-2 seconds. Relax and repeat.  This information is not intended to replace advice given to you by your health care provider. Make sure you discuss any questions you have with your health care provider. Document Released: 02/21/2015 Document Revised: 08/18/2015 Document Reviewed: 09/20/2014 Elsevier Interactive Patient Education  2017 [employer].

## 2017-01-29 NOTE — Progress Notes (Signed)
Subjective:    Patient ID: Jesus Bush, male    DOB: 08-17-1945, 71 y.o.   MRN: [id number]  HPI 71 y.o. WM presents with left neck pain x 2 months. No injury, woke up with left neck pain.  Left neck to his shoulder, on pain down his arm. Has tried muscle relaxer/baclofen that helps. Has done biofreeze, advil without help.  No fever, chills, not worse with exertion, no SOB.   Blood pressure 118/68, pulse 75, temperature 98.6 F (37 C), resp. rate 16, height 5' 9.5" (1.765 m), weight 148 lb (67.1 kg), SpO2 94 %.  Medications Current Outpatient Medications on File Prior to Visit  Medication Sig  . albuterol (PROVENTIL HFA;VENTOLIN HFA) 108 (90 Base) MCG/ACT inhaler Inhale 2 puffs into the lungs every 4 (four) hours as needed for wheezing or shortness of breath.  Marland Kitchen aspirin 81 MG tablet Take 81 mg by mouth daily.  Marland Kitchen atorvastatin (LIPITOR) 20 MG tablet Take 1 tablet (20 mg total) by mouth daily.  Marland Kitchen buPROPion (WELLBUTRIN SR) 100 MG 12 hr tablet Take 1 tablet (100 mg total) by mouth 2 (two) times daily.  . Cholecalciferol (VITAMIN D) 2000 UNITS CAPS Take 1 capsule by mouth daily.  Marland Kitchen DILT-XR 240 MG 24 hr capsule TAKE 1 CAPSULE DAILY FOR BLOOD PRESSURE AND HEART BEAT  . finasteride (PROSCAR) 5 MG tablet Take 1 tablet daily for Prostate  . losartan (COZAAR) 100 MG tablet TAKE 1 TABLET EVERY DAY FOR BLOOD PRESSURE  . magnesium gluconate (MAGONATE) 500 MG tablet Take 500 mg by mouth 2 (two) times daily.  . pantoprazole (PROTONIX) 40 MG tablet Take 1 tablet (40 mg total) by mouth daily.  . ranitidine (ZANTAC) 300 MG tablet Take 1 to tablet daily for heartburn & reflux   No current facility-administered medications on file prior to visit.     Problem list He has Abnormal glucose; Essential hypertension; Mixed hyperlipidemia; Vitamin D deficiency; Medication management; Tobacco use disorder; Esophageal reflux; Encounter for general adult medical examination with abnormal findings; and COPD  (chronic obstructive pulmonary disease) ([place]) on their problem list.   Review of Systems  Constitutional: Negative.  Negative for chills and fever.  HENT: Negative.   Respiratory: Negative.  Negative for cough, chest tightness and shortness of breath.   Cardiovascular: Negative.  Negative for chest pain.  Gastrointestinal: Negative.   Genitourinary: Negative.  Negative for flank pain, frequency, hematuria and urgency.  Musculoskeletal: Positive for neck pain. Negative for arthralgias, back pain and gait problem.  Skin: Negative.  Negative for rash.  Neurological: Negative for tremors, weakness and numbness.  Psychiatric/Behavioral: Negative.        Objective:   Physical Exam  Musculoskeletal:  Some pain with rotation to the right and flexion of the neck.  without spinous process tenderness, with paraspinal muscle tenderness the left side, normal sensation, reflexes, and pulses distal.      Assessment & Plan:   Needs flu shot -     Flu vaccine HIGH DOSE PF  Neck pain on left side -     dexamethasone (DECADRON) injection 10 mg -     baclofen (LIORESAL) 10 MG tablet; Take 1 tablet (10 mg total) 2 (two) times daily by mouth. Take 1/2 to 1 tablet 2 x day if needed for muscle spasm -     meloxicam (MOBIC) 15 MG tablet; Take one daily with food for 2 weeks, can take with tylenol, can not take with aleve, iburpofen, then as needed daily  for pain  A trigger point injection was performed at the site of maximal tenderness using 1% plain Lidocaine and dexamethasone. This was well tolerated, and followed by relief of pain.

## 2017-02-11 ENCOUNTER — Ambulatory Visit: Payer: Self-pay | Admitting: Internal Medicine

## 2017-02-11 NOTE — Progress Notes (Signed)
NO SHOW

## 2017-02-26 ENCOUNTER — Ambulatory Visit (INDEPENDENT_AMBULATORY_CARE_PROVIDER_SITE_OTHER): Payer: Medicare HMO | Admitting: Internal Medicine

## 2017-02-26 ENCOUNTER — Other Ambulatory Visit: Payer: Self-pay | Admitting: *Deleted

## 2017-02-26 ENCOUNTER — Encounter: Payer: Self-pay | Admitting: Internal Medicine

## 2017-02-26 VITALS — BP 118/66 | HR 76 | Temp 97.3°F | Resp 16 | Ht 69.5 in | Wt 150.4 lb

## 2017-02-26 DIAGNOSIS — E782 Mixed hyperlipidemia: Secondary | ICD-10-CM

## 2017-02-26 DIAGNOSIS — I10 Essential (primary) hypertension: Secondary | ICD-10-CM | POA: Diagnosis not present

## 2017-02-26 DIAGNOSIS — E559 Vitamin D deficiency, unspecified: Secondary | ICD-10-CM | POA: Diagnosis not present

## 2017-02-26 DIAGNOSIS — E119 Type 2 diabetes mellitus without complications: Secondary | ICD-10-CM | POA: Diagnosis not present

## 2017-02-26 DIAGNOSIS — Z79899 Other long term (current) drug therapy: Secondary | ICD-10-CM | POA: Diagnosis not present

## 2017-02-26 MED ORDER — ALBUTEROL SULFATE HFA 108 (90 BASE) MCG/ACT IN AERS: 2.0000 | INHALATION_SPRAY | RESPIRATORY_TRACT | 0 refills | Status: DC | PRN

## 2017-02-26 NOTE — Patient Instructions (Signed)

## 2017-02-26 NOTE — Progress Notes (Signed)
This very nice 71 y.o. MWM  presents for 6 month follow up with Hypertension, Hyperlipidemia, T2_NIDDM, BPH/LUTS, GERD and Vitamin D Deficiency.      Patient is treated for HTN (2005) & BP has been controlled at home. Today's BP is at goal - 118/66. Patient has had no complaints of any cardiac type chest pain, palpitations, dyspnea / orthopnea / PND, dizziness, claudication, or dependent edema.     Hyperlipidemia is not controlled with diet & meds. Patient denies myalgias or other med SE's. Last Lipids were not at goal:   Lab Results  Component Value Date   CHOL 212 (H) 11/09/2016   HDL 35 (L) 11/09/2016   LDLCALC 79 08/06/2016   TRIG 358 (H) 11/09/2016   CHOLHDL 6.1 (H) 11/09/2016      Also, the patient has history of T2_NIDDM (A1c 7.5% / 2010) . Patient d/c'd Metformin in Aug for question of Upper GI SE's and reports FBG's range in the 90's.  He denies symptoms of reactive hypoglycemia, diabetic polys, paresthesias or visual blurring.  Last A1c was not at goal: Lab Results  Component Value Date   HGBA1C 6.1 (H) 11/09/2016      Further, the patient also has history of Vitamin D Deficiency ("23" / 2008) and supplements vitamin D without any suspected side-effects. Last vitamin D was   Lab Results  Component Value Date   VD25OH 52 08/06/2016   Current Outpatient Medications on File Prior to Visit  Medication Sig  . aspirin 81 MG tablet Take 81 mg by mouth daily.  Marland Kitchen atorvastatin (LIPITOR) 20 MG tablet Take 1 tablet (20 mg total) by mouth daily.  . baclofen (LIORESAL) 10 MG tablet Take 1 tablet (10 mg total) 2 (two) times daily by mouth. Take 1/2 to 1 tablet 2 x day if needed for muscle spasm  . buPROPion (WELLBUTRIN SR) 100 MG 12 hr tablet Take 1 tablet (100 mg total) by mouth 2 (two) times daily.  . Cholecalciferol (VITAMIN D) 2000 UNITS CAPS Take 1 capsule by mouth daily.  Marland Kitchen DILT-XR 240 MG 24 hr capsule TAKE 1 CAPSULE DAILY FOR BLOOD PRESSURE AND HEART BEAT  . finasteride  (PROSCAR) 5 MG tablet Take 1 tablet daily for Prostate  . losartan (COZAAR) 100 MG tablet TAKE 1 TABLET EVERY DAY FOR BLOOD PRESSURE  . magnesium gluconate (MAGONATE) 500 MG tablet Take 500 mg by mouth daily.   . meloxicam (MOBIC) 15 MG tablet Take one daily with food for 2 weeks, can take with tylenol, can not take with aleve, iburpofen, then as needed daily for pain  . pantoprazole (PROTONIX) 40 MG tablet Take 1 tablet (40 mg total) by mouth daily.  . ranitidine (ZANTAC) 300 MG tablet Take 1 to tablet daily for heartburn & reflux   No current facility-administered medications on file prior to visit.    Allergies  Allergen Reactions  . Ppd [Tuberculin Purified Protein Derivative]     Positive PPD.   PMHx:   Past Medical History:  Diagnosis Date  . Diabetes mellitus without complication ([place])   . Hyperlipidemia   . Hypertension   . Vitamin D deficiency    Immunization History  Administered Date(s) Administered  . DTaP 05/23/2006  . Influenza Whole 12/16/2012  . Influenza, High Dose Seasonal PF 01/17/2015, 12/13/2015, 01/29/2017  . Influenza-Unspecified 12/27/2013  . Pneumococcal Conjugate-13 04/13/2014  . Pneumococcal Polysaccharide-23 09/13/2008, 08/06/2016  . Td 08/06/2016  . Zoster 04/17/2013   No past surgical history  on file. FHx:    Reviewed / unchanged  SHx:    Reviewed / unchanged  Systems Review:  Constitutional: Denies fever, chills, wt changes, headaches, insomnia, fatigue, night sweats, change in appetite. Eyes: Denies redness, blurred vision, diplopia, discharge, itchy, watery eyes.  ENT: Denies discharge, congestion, post nasal drip, epistaxis, sore throat, earache, hearing loss, dental pain, tinnitus, vertigo, sinus pain, snoring.  CV: Denies chest pain, palpitations, irregular heartbeat, syncope, dyspnea, diaphoresis, orthopnea, PND, claudication or edema. Respiratory: denies cough, dyspnea, DOE, pleurisy, hoarseness, laryngitis, wheezing.    Gastrointestinal: Denies dysphagia, odynophagia, heartburn, reflux, water brash, abdominal pain or cramps, nausea, vomiting, bloating, diarrhea, constipation, hematemesis, melena, hematochezia  or hemorrhoids. Genitourinary: Denies dysuria, frequency, urgency, nocturia, hesitancy, discharge, hematuria or flank pain. Musculoskeletal: Denies arthralgias, myalgias, stiffness, jt. swelling, pain, limping or strain/sprain.  Skin: Denies pruritus, rash, hives, warts, acne, eczema or change in skin lesion(s). Neuro: No weakness, tremor, incoordination, spasms, paresthesia or pain. Psychiatric: Denies confusion, memory loss or sensory loss. Endo: Denies change in weight, skin or hair change.  Heme/Lymph: No excessive bleeding, bruising or enlarged lymph nodes.  Physical Exam  BP 118/66   Pulse 76   Temp (!) 97.3 F (36.3 C)   Resp 16   Ht 5' 9.5" (1.765 m)   Wt 150 lb 6.4 oz (68.2 kg)   BMI 21.89 kg/m   Appears well nourished, well groomed  and in no distress.  Eyes: PERRLA, EOMs, conjunctiva no swelling or erythema. Sinuses: No frontal/maxillary tenderness ENT/Mouth: EAC's clear, TM's nl w/o erythema, bulging. Nares clear w/o erythema, swelling, exudates. Oropharynx clear without erythema or exudates. Oral hygiene is good. Tongue normal, non obstructing. Hearing intact.  Neck: Supple. Thyroid nl. Car 2+/2+ without bruits, nodes or JVD. Chest: Respirations nl with BS clear & equal w/o rales, rhonchi, wheezing or stridor.  Cor: Heart sounds normal w/ regular rate and rhythm without sig. murmurs, gallops, clicks or rubs. Peripheral pulses normal and equal  without edema.  Abdomen: Soft & bowel sounds normal. Non-tender w/o guarding, rebound, hernias, masses or organomegaly.  Lymphatics: Unremarkable.  Musculoskeletal: Full ROM all peripheral extremities, joint stability, 5/5 strength and normal gait.  Skin: Warm, dry without exposed rashes, lesions or ecchymosis apparent.  Neuro: Cranial  nerves intact, reflexes equal bilaterally. Sensory-motor testing grossly intact. Tendon reflexes grossly intact.  Pysch: Alert & oriented x 3.  Insight and judgement nl & appropriate. No ideations.  Assessment and Plan:  1. Essential hypertension  - Continue medication, monitor blood pressure at home.  - Continue DASH diet. Reminder to go to the ER if any CP,  SOB, nausea, dizziness, severe HA, changes vision/speech.   - CBC with Differential/Platelet - BASIC METABOLIC PANEL WITH GFR  - Magnesium - TSH  2. Mixed hyperlipidemia  - Continue diet/meds, exercise,& lifestyle modifications.  - Continue monitor periodic cholesterol/liver & renal functions  - Hepatic function panel - Lipid panel - TSH  3. Diabetes mellitus without complication ([place])  - Continue diet, exercise, lifestyle modifications.  - Monitor appropriate labs.  - Hemoglobin A1c - Insulin, random  4. Vitamin D deficiency  - Continue supplementation.  - VITAMIN D 25 Hydroxy   5. Medication management  - CBC with Differential/Platelet - BASIC METABOLIC PANEL WITH GFR - Hepatic function panel - Magnesium - Lipid panel - TSH - Hemoglobin A1c - Insulin, random - VITAMIN D 25 Hydroxy       Discussed  regular exercise, BP monitoring, weight control to achieve/maintain BMI less than 25  and discussed med and SE's. Recommended labs to assess and monitor clinical status with further disposition pending results of labs. Over 30 minutes of exam, counseling, chart review was performed.

## 2017-02-27 ENCOUNTER — Other Ambulatory Visit: Payer: Self-pay | Admitting: Internal Medicine

## 2017-02-27 DIAGNOSIS — E782 Mixed hyperlipidemia: Secondary | ICD-10-CM

## 2017-02-27 LAB — CBC WITH DIFFERENTIAL/PLATELET
BASOS PCT: 0.8 %
Basophils Absolute: 53 cells/uL (ref 0–200)
EOS ABS: 53 {cells}/uL (ref 15–500)
EOS PCT: 0.8 %
HCT: 45.8 % (ref 38.5–50.0)
HEMOGLOBIN: 15.7 g/dL (ref 13.2–17.1)
LYMPHS ABS: 1703 {cells}/uL (ref 850–3900)
MCH: 32.6 pg (ref 27.0–33.0)
MCHC: 34.3 g/dL (ref 32.0–36.0)
MCV: 95 fL (ref 80.0–100.0)
MONOS PCT: 8 %
MPV: 11 fL (ref 7.5–12.5)
NEUTROS ABS: 4264 {cells}/uL (ref 1500–7800)
Neutrophils Relative %: 64.6 %
Platelets: 304 10*3/uL (ref 140–400)
RBC: 4.82 10*6/uL (ref 4.20–5.80)
RDW: 12.3 % (ref 11.0–15.0)
Total Lymphocyte: 25.8 %
WBC mixed population: 528 cells/uL (ref 200–950)
WBC: 6.6 10*3/uL (ref 3.8–10.8)

## 2017-02-27 LAB — LIPID PANEL
CHOLESTEROL: 201 mg/dL — AB (ref <200)
HDL: 41 mg/dL (ref >40)
LDL Cholesterol (Calc): 128 mg/dL (calc) — ABNORMAL HIGH
NON-HDL CHOLESTEROL (CALC): 160 mg/dL — AB (ref <130)
Total CHOL/HDL Ratio: 4.9 (calc) (ref <5.0)
Triglycerides: 181 mg/dL — ABNORMAL HIGH (ref <150)

## 2017-02-27 LAB — BASIC METABOLIC PANEL WITH GFR
BUN: 13 mg/dL (ref 7–25)
CALCIUM: 10.2 mg/dL (ref 8.6–10.3)
CHLORIDE: 101 mmol/L (ref 98–110)
CO2: 28 mmol/L (ref 20–32)
Creat: 1.01 mg/dL (ref 0.70–1.18)
GFR, Est African American: 86 mL/min/{1.73_m2} (ref >=60)
GFR, Est Non African American: 74 mL/min/{1.73_m2} (ref >=60)
GLUCOSE: 112 mg/dL — AB (ref 65–99)
POTASSIUM: 4.4 mmol/L (ref 3.5–5.3)
SODIUM: 137 mmol/L (ref 135–146)

## 2017-02-27 LAB — HEPATIC FUNCTION PANEL
AG Ratio: 1.6 (calc) (ref 1.0–2.5)
ALBUMIN MSPROF: 4.6 g/dL (ref 3.6–5.1)
ALKALINE PHOSPHATASE (APISO): 116 U/L — AB (ref 40–115)
ALT: 12 U/L (ref 9–46)
AST: 14 U/L (ref 10–35)
BILIRUBIN TOTAL: 0.5 mg/dL (ref 0.2–1.2)
Bilirubin, Direct: 0.1 mg/dL (ref 0.0–0.2)
Globulin: 2.9 g/dL (calc) (ref 1.9–3.7)
Indirect Bilirubin: 0.4 mg/dL (calc) (ref 0.2–1.2)
TOTAL PROTEIN: 7.5 g/dL (ref 6.1–8.1)

## 2017-02-27 LAB — VITAMIN D 25 HYDROXY (VIT D DEFICIENCY, FRACTURES): VIT D 25 HYDROXY: 41 ng/mL (ref 30–100)

## 2017-02-27 LAB — HEMOGLOBIN A1C
HEMOGLOBIN A1C: 5.9 %{Hb} — AB (ref <5.7)
Mean Plasma Glucose: 123 (calc)
eAG (mmol/L): 6.8 (calc)

## 2017-02-27 LAB — INSULIN, RANDOM: INSULIN: 9.7 u[IU]/mL (ref 2.0–19.6)

## 2017-02-27 LAB — MAGNESIUM: Magnesium: 1.8 mg/dL (ref 1.5–2.5)

## 2017-02-27 LAB — TSH: TSH: 0.57 m[IU]/L (ref 0.40–4.50)

## 2017-02-27 MED ORDER — ATORVASTATIN CALCIUM 80 MG PO TABS: ORAL_TABLET | ORAL | 1 refills | Status: DC

## 2017-03-11 DIAGNOSIS — H524 Presbyopia: Secondary | ICD-10-CM | POA: Diagnosis not present

## 2017-03-11 DIAGNOSIS — E119 Type 2 diabetes mellitus without complications: Secondary | ICD-10-CM | POA: Diagnosis not present

## 2017-03-11 LAB — HM DIABETES EYE EXAM

## 2017-03-14 ENCOUNTER — Encounter: Payer: Self-pay | Admitting: *Deleted

## 2017-03-27 ENCOUNTER — Other Ambulatory Visit: Payer: Self-pay | Admitting: Physician Assistant

## 2017-03-27 DIAGNOSIS — K219 Gastro-esophageal reflux disease without esophagitis: Secondary | ICD-10-CM

## 2017-05-07 ENCOUNTER — Other Ambulatory Visit: Payer: Self-pay | Admitting: Internal Medicine

## 2017-06-02 NOTE — Progress Notes (Signed)
FOLLOW UP  Assessment and Plan:   Hypertension Well controlled with current medications  Monitor blood pressure at home; patient to call if consistently greater than 130/80 Continue DASH diet.   Reminder to go to the ER if any CP, SOB, nausea, dizziness, severe HA, changes vision/speech, left arm numbness and tingling and jaw pain.  Cholesterol Currently near goal by statin; diet for trigs discussed Continue low cholesterol diet and exercise.  Check lipid panel.   Prediabetes Continue diet and exercise.  Perform daily foot/skin check, notify office of any concerning changes.  Check A1C  Acid reflux Well managed on current medications - will attempt taper off of PPI Discussed diet, avoiding triggers and other lifestyle changes  Normal weight (BMI 22 ) Continue to recommend diet heavy in fruits and veggies and low in animal meats, cheeses, and dairy products, appropriate calorie intake Discuss exercise recommendations routinely Continue to monitor weight at each visit  Vitamin D Def Below goal at last visit; discussed increasing supplementation to reach goal of 70-100 Defer Vit D level  Tobacco use Discussed risks associated with tobacco use and advised to reduce or quit Patient is not ready to do so, but advised to consider strongly Will follow up at the next visit  Continue diet and meds as discussed. Further disposition pending results of labs. Discussed med's effects and SE's.   Over 30 minutes of exam, counseling, chart review, and critical decision making was performed.   Future Appointments  Date Time Provider [place]  09/09/2017  3:00 PM Unk Pinto, MD [facility] None    ----------------------------------------------------------------------------------------------------------------------  HPI 72 y.o. male  presents for 3 month follow up on hypertension, cholesterol, prediabetes, weight and vitamin D deficiency.   he currently continues to  smoke 2-3 cigarettes a day; discussed risks associated with smoking, patient is actively cutting down with goal to quit this year; is on wellbutrin.   he has a diagnosis of esophageal reflux which is currently managed by protonix and ranitidine. He reports symptoms is currently well controlled, and denies breakthrough reflux, burning in chest, hoarseness or cough.    BMI is Body mass index is 22.56 kg/m., he has been working on diet and exercise. Wt Readings from Last 3 Encounters:  06/03/17 155 lb (70.3 kg)  02/26/17 150 lb 6.4 oz (68.2 kg)  01/29/17 148 lb (67.1 kg)   His blood pressure has been controlled at home, today their BP is BP: 118/70  He does workout. He denies chest pain, shortness of breath, dizziness.   He is on cholesterol medication (atorvastatin 80 mg daily) and denies myalgias. His cholesterol is not at goal. The cholesterol last visit was:   Lab Results  Component Value Date   CHOL 201 (H) 02/26/2017   HDL 41 02/26/2017   LDLCALC 79 08/06/2016   TRIG 181 (H) 02/26/2017   CHOLHDL 4.9 02/26/2017    He has been working on diet and exercise for prediabetes, and denies foot ulcerations, hyperglycemia, hypoglycemia , increased appetite, nausea, paresthesia of the feet, polydipsia, polyuria, visual disturbances, vomiting and weight loss. He checks a fasting sugar daily, runs 90-130. Last A1C in the office was:  Lab Results  Component Value Date   HGBA1C 5.9 (H) 02/26/2017   Patient is on Vitamin D supplement but remains below goal of 70:    Lab Results  Component Value Date   VD25OH 41 02/26/2017    He did increase dose.   Current Medications:  Current Outpatient Medications on File Prior  to Visit  Medication Sig  . albuterol (PROVENTIL HFA;VENTOLIN HFA) 108 (90 Base) MCG/ACT inhaler Inhale 2 puffs into the lungs every 4 (four) hours as needed for wheezing or shortness of breath.  Marland Kitchen aspirin 81 MG tablet Take 81 mg by mouth daily.  Marland Kitchen atorvastatin (LIPITOR) 80 MG  tablet Take 1/2 to 1 tablet daily as directed for Cholesterol  . baclofen (LIORESAL) 10 MG tablet Take 1 tablet (10 mg total) 2 (two) times daily by mouth. Take 1/2 to 1 tablet 2 x day if needed for muscle spasm  . buPROPion (WELLBUTRIN SR) 100 MG 12 hr tablet Take 1 tablet (100 mg total) by mouth 2 (two) times daily.  . Cholecalciferol (VITAMIN D) 2000 UNITS CAPS Take 1 capsule by mouth daily.  Marland Kitchen DILT-XR 240 MG 24 hr capsule TAKE 1 CAPSULE DAILY FOR BLOOD PRESSURE AND HEART BEAT  . finasteride (PROSCAR) 5 MG tablet Take 1 tablet daily for Prostate  . losartan (COZAAR) 100 MG tablet TAKE 1 TABLET EVERY DAY FOR BLOOD PRESSURE  . magnesium gluconate (MAGONATE) 500 MG tablet Take 500 mg by mouth daily.   . meloxicam (MOBIC) 15 MG tablet Take one daily with food for 2 weeks, can take with tylenol, can not take with aleve, iburpofen, then as needed daily for pain  . pantoprazole (PROTONIX) 40 MG tablet TAKE 1 TABLET EVERY DAY  . ranitidine (ZANTAC) 300 MG tablet Take 1 to tablet daily for heartburn & reflux   No current facility-administered medications on file prior to visit.      Allergies:  Allergies  Allergen Reactions  . Ppd [Tuberculin Purified Protein Derivative]     Positive PPD.     Medical History:  Past Medical History:  Diagnosis Date  . Diabetes mellitus without complication ([place])   . Hyperlipidemia   . Hypertension   . Vitamin D deficiency    Family history- Reviewed and unchanged Social history- Reviewed and unchanged   Review of Systems:  Review of Systems  Constitutional: Negative for malaise/fatigue and weight loss.  HENT: Negative for hearing loss and tinnitus.   Eyes: Negative for blurred vision and double vision.  Respiratory: Negative for cough, shortness of breath and wheezing.   Cardiovascular: Negative for chest pain, palpitations, orthopnea, claudication and leg swelling.  Gastrointestinal: Negative for abdominal pain, blood in stool, constipation,  diarrhea, heartburn, melena, nausea and vomiting.  Genitourinary: Negative.   Musculoskeletal: Negative for joint pain and myalgias.  Skin: Negative for rash.  Neurological: Negative for dizziness, tingling, sensory change, weakness and headaches.  Endo/Heme/Allergies: Negative for polydipsia.  Psychiatric/Behavioral: Negative.   All other systems reviewed and are negative.   Physical Exam: BP 118/70   Pulse 74   Temp 97.7 F (36.5 C)   Ht 5' 9.5" (1.765 m)   Wt 155 lb (70.3 kg)   SpO2 99%   BMI 22.56 kg/m  Wt Readings from Last 3 Encounters:  06/03/17 155 lb (70.3 kg)  02/26/17 150 lb 6.4 oz (68.2 kg)  01/29/17 148 lb (67.1 kg)   General Appearance: Well nourished, in no apparent distress. Eyes: PERRLA, EOMs, conjunctiva no swelling or erythema Sinuses: No Frontal/maxillary tenderness ENT/Mouth: Ext aud canals clear, TMs without erythema, bulging. No erythema, swelling, or exudate on post pharynx.  Tonsils not swollen or erythematous. Hearing normal.  Neck: Supple, thyroid normal.  Respiratory: Respiratory effort normal, BS equal bilaterally without rales, rhonchi, wheezing or stridor.  Cardio: RRR with no MRGs. Brisk peripheral pulses without edema.  Abdomen:  Soft, + BS.  Non tender, no guarding, rebound, hernias, masses. Lymphatics: Non tender without lymphadenopathy.  Musculoskeletal: Full ROM, 5/5 strength, Normal gait Skin: Warm, dry without rashes, lesions, ecchymosis.  Neuro: Cranial nerves intact. No cerebellar symptoms.  Psych: Awake and oriented X 3, normal affect, Insight and Judgment appropriate.    Izora Ribas, NP 9:51 AM [facility]

## 2017-06-03 ENCOUNTER — Ambulatory Visit (INDEPENDENT_AMBULATORY_CARE_PROVIDER_SITE_OTHER): Payer: Medicare HMO | Admitting: Adult Health

## 2017-06-03 ENCOUNTER — Encounter: Payer: Self-pay | Admitting: Adult Health

## 2017-06-03 VITALS — BP 118/70 | HR 74 | Temp 97.7°F | Ht 69.5 in | Wt 155.0 lb

## 2017-06-03 DIAGNOSIS — E782 Mixed hyperlipidemia: Secondary | ICD-10-CM | POA: Diagnosis not present

## 2017-06-03 DIAGNOSIS — F17200 Nicotine dependence, unspecified, uncomplicated: Secondary | ICD-10-CM

## 2017-06-03 DIAGNOSIS — I10 Essential (primary) hypertension: Secondary | ICD-10-CM

## 2017-06-03 DIAGNOSIS — K219 Gastro-esophageal reflux disease without esophagitis: Secondary | ICD-10-CM

## 2017-06-03 DIAGNOSIS — Z79899 Other long term (current) drug therapy: Secondary | ICD-10-CM

## 2017-06-03 DIAGNOSIS — R7309 Other abnormal glucose: Secondary | ICD-10-CM | POA: Diagnosis not present

## 2017-06-03 DIAGNOSIS — E559 Vitamin D deficiency, unspecified: Secondary | ICD-10-CM

## 2017-06-03 NOTE — Patient Instructions (Addendum)
  GETTING OFF OF PPI's    Nexium/protonix/prilosec/Omeprazole/Dexilant/Aciphex are called PPI's, they are great at healing your stomach but should only be taken for a short period of time.     Recent studies have shown that taken for a long time they  can increase the risk of osteoporosis (weakening of your bones), pneumonia, low magnesium, restless legs, Cdiff (infection that causes diarrhea), DEMENTIA and most recently kidney damage / disease / insufficiency.     Due to this information we want to try to stop the PPI but if you try to stop it abruptly this can cause rebound acid and worsening symptoms.   Start taking ranitidine 300 mg twice a day, then take protonix every other day for 2 weeks, then go to every 3rd day, and every 4th - then can stop if not having any problems. You can keep taking ranitidine twice a day, or can try going back to once daily - or 150 mg twice daily.     - Avoid alcohol, spicy foods, NSAIDS (aleve, ibuprofen) at this time. See foods below.   +++++++++++++++++++++++++++++++++++++++++++  Food Choices for Gastroesophageal Reflux Disease  When you have gastroesophageal reflux disease (GERD), the foods you eat and your eating habits are very important. Choosing the right foods can help ease the discomfort of GERD. WHAT GENERAL GUIDELINES DO I NEED TO FOLLOW?  Choose fruits, vegetables, whole grains, low-fat dairy products, and low-fat meat, fish, and poultry.  Limit fats such as oils, salad dressings, butter, nuts, and avocado.  Keep a food diary to identify foods that cause symptoms.  Avoid foods that cause reflux. These may be different for different people.  Eat frequent small meals instead of three large meals each day.  Eat your meals slowly, in a relaxed setting.  Limit fried foods.  Cook foods using methods other than frying.  Avoid drinking alcohol.  Avoid drinking large amounts of liquids with your meals.  Avoid bending over or lying down  until 2-3 hours after eating.   WHAT FOODS ARE NOT RECOMMENDED? The following are some foods and drinks that may worsen your symptoms:  Vegetables Tomatoes. Tomato juice. Tomato and spaghetti sauce. Chili peppers. Onion and garlic. Horseradish. Fruits Oranges, grapefruit, and lemon (fruit and juice). Meats High-fat meats, fish, and poultry. This includes hot dogs, ribs, ham, sausage, salami, and bacon. Dairy Whole milk and chocolate milk. Sour cream. Cream. Butter. Ice cream. Cream cheese.  Beverages Coffee and tea, with or without caffeine. Carbonated beverages or energy drinks. Condiments Hot sauce. Barbecue sauce.  Sweets/Desserts Chocolate and cocoa. Donuts. Peppermint and spearmint. Fats and Oils High-fat foods, including [place] fries and potato chips. Other Vinegar. Strong spices, such as black pepper, white pepper, red pepper, cayenne, curry powder, cloves, ginger, and chili powder.

## 2017-06-04 LAB — CBC WITH DIFFERENTIAL/PLATELET
BASOS ABS: 58 {cells}/uL (ref 0–200)
Basophils Relative: 0.8 %
EOS ABS: 44 {cells}/uL (ref 15–500)
Eosinophils Relative: 0.6 %
HCT: 43.4 % (ref 38.5–50.0)
Hemoglobin: 15.3 g/dL (ref 13.2–17.1)
Lymphs Abs: 1643 cells/uL (ref 850–3900)
MCH: 32.6 pg (ref 27.0–33.0)
MCHC: 35.3 g/dL (ref 32.0–36.0)
MCV: 92.3 fL (ref 80.0–100.0)
MONOS PCT: 7.7 %
MPV: 10.3 fL (ref 7.5–12.5)
Neutro Abs: 4993 cells/uL (ref 1500–7800)
Neutrophils Relative %: 68.4 %
PLATELETS: 300 10*3/uL (ref 140–400)
RBC: 4.7 10*6/uL (ref 4.20–5.80)
RDW: 12.7 % (ref 11.0–15.0)
TOTAL LYMPHOCYTE: 22.5 %
WBC mixed population: 562 cells/uL (ref 200–950)
WBC: 7.3 10*3/uL (ref 3.8–10.8)

## 2017-06-04 LAB — HEPATIC FUNCTION PANEL
AG RATIO: 1.7 (calc) (ref 1.0–2.5)
ALKALINE PHOSPHATASE (APISO): 91 U/L (ref 40–115)
ALT: 11 U/L (ref 9–46)
AST: 13 U/L (ref 10–35)
Albumin: 4.3 g/dL (ref 3.6–5.1)
BILIRUBIN DIRECT: 0.1 mg/dL (ref 0.0–0.2)
BILIRUBIN INDIRECT: 0.4 mg/dL (ref 0.2–1.2)
Globulin: 2.6 g/dL (calc) (ref 1.9–3.7)
TOTAL PROTEIN: 6.9 g/dL (ref 6.1–8.1)
Total Bilirubin: 0.5 mg/dL (ref 0.2–1.2)

## 2017-06-04 LAB — BASIC METABOLIC PANEL WITH GFR
BUN: 11 mg/dL (ref 7–25)
CHLORIDE: 103 mmol/L (ref 98–110)
CO2: 30 mmol/L (ref 20–32)
Calcium: 9.9 mg/dL (ref 8.6–10.3)
Creat: 1.17 mg/dL (ref 0.70–1.18)
GFR, EST AFRICAN AMERICAN: 72 mL/min/{1.73_m2} (ref >=60)
GFR, Est Non African American: 62 mL/min/{1.73_m2} (ref >=60)
GLUCOSE: 106 mg/dL — AB (ref 65–99)
Potassium: 4.4 mmol/L (ref 3.5–5.3)
SODIUM: 139 mmol/L (ref 135–146)

## 2017-06-04 LAB — LIPID PANEL
CHOL/HDL RATIO: 5.4 (calc) — AB (ref <5.0)
Cholesterol: 190 mg/dL (ref <200)
HDL: 35 mg/dL — AB (ref >40)
LDL Cholesterol (Calc): 120 mg/dL (calc) — ABNORMAL HIGH
NON-HDL CHOLESTEROL (CALC): 155 mg/dL — AB (ref <130)
Triglycerides: 234 mg/dL — ABNORMAL HIGH (ref <150)

## 2017-06-04 LAB — HEMOGLOBIN A1C
EAG (MMOL/L): 6.8 (calc)
HEMOGLOBIN A1C: 5.9 %{Hb} — AB (ref <5.7)
MEAN PLASMA GLUCOSE: 123 (calc)

## 2017-06-04 LAB — MAGNESIUM: Magnesium: 2 mg/dL (ref 1.5–2.5)

## 2017-06-04 LAB — VITAMIN D 25 HYDROXY (VIT D DEFICIENCY, FRACTURES): Vit D, 25-Hydroxy: 51 ng/mL (ref 30–100)

## 2017-06-04 LAB — TSH: TSH: 0.37 mIU/L — ABNORMAL LOW (ref 0.40–4.50)

## 2017-06-27 ENCOUNTER — Other Ambulatory Visit: Payer: Self-pay | Admitting: Internal Medicine

## 2017-07-17 ENCOUNTER — Encounter: Payer: Self-pay | Admitting: Gastroenterology

## 2017-07-28 ENCOUNTER — Other Ambulatory Visit: Payer: Self-pay | Admitting: Internal Medicine

## 2017-07-28 DIAGNOSIS — E782 Mixed hyperlipidemia: Secondary | ICD-10-CM

## 2017-07-28 MED ORDER — ATORVASTATIN CALCIUM 20 MG PO TABS: ORAL_TABLET | ORAL | 3 refills | Status: DC

## 2017-07-30 ENCOUNTER — Other Ambulatory Visit: Payer: Self-pay | Admitting: Internal Medicine

## 2017-07-30 DIAGNOSIS — E782 Mixed hyperlipidemia: Secondary | ICD-10-CM

## 2017-07-30 MED ORDER — ATORVASTATIN CALCIUM 20 MG PO TABS: ORAL_TABLET | ORAL | 3 refills | Status: DC

## 2017-08-05 ENCOUNTER — Other Ambulatory Visit: Payer: Self-pay | Admitting: Internal Medicine

## 2017-08-05 DIAGNOSIS — E782 Mixed hyperlipidemia: Secondary | ICD-10-CM

## 2017-08-05 MED ORDER — ATORVASTATIN CALCIUM 20 MG PO TABS: ORAL_TABLET | ORAL | 3 refills | Status: DC

## 2017-09-09 ENCOUNTER — Ambulatory Visit (INDEPENDENT_AMBULATORY_CARE_PROVIDER_SITE_OTHER): Payer: Medicare HMO | Admitting: Internal Medicine

## 2017-09-09 ENCOUNTER — Encounter: Payer: Self-pay | Admitting: Internal Medicine

## 2017-09-09 VITALS — BP 122/80 | HR 66 | Temp 97.4°F | Resp 18 | Ht 69.5 in | Wt 150.0 lb

## 2017-09-09 DIAGNOSIS — Z79899 Other long term (current) drug therapy: Secondary | ICD-10-CM

## 2017-09-09 DIAGNOSIS — Z136 Encounter for screening for cardiovascular disorders: Secondary | ICD-10-CM | POA: Diagnosis not present

## 2017-09-09 DIAGNOSIS — J449 Chronic obstructive pulmonary disease, unspecified: Secondary | ICD-10-CM

## 2017-09-09 DIAGNOSIS — E559 Vitamin D deficiency, unspecified: Secondary | ICD-10-CM

## 2017-09-09 DIAGNOSIS — I10 Essential (primary) hypertension: Secondary | ICD-10-CM | POA: Diagnosis not present

## 2017-09-09 DIAGNOSIS — E782 Mixed hyperlipidemia: Secondary | ICD-10-CM | POA: Diagnosis not present

## 2017-09-09 DIAGNOSIS — E119 Type 2 diabetes mellitus without complications: Secondary | ICD-10-CM | POA: Diagnosis not present

## 2017-09-09 DIAGNOSIS — Z125 Encounter for screening for malignant neoplasm of prostate: Secondary | ICD-10-CM

## 2017-09-09 DIAGNOSIS — N138 Other obstructive and reflux uropathy: Secondary | ICD-10-CM | POA: Diagnosis not present

## 2017-09-09 DIAGNOSIS — Z8249 Family history of ischemic heart disease and other diseases of the circulatory system: Secondary | ICD-10-CM

## 2017-09-09 DIAGNOSIS — Z1211 Encounter for screening for malignant neoplasm of colon: Secondary | ICD-10-CM | POA: Diagnosis not present

## 2017-09-09 DIAGNOSIS — Z0001 Encounter for general adult medical examination with abnormal findings: Secondary | ICD-10-CM

## 2017-09-09 DIAGNOSIS — Z0000 Encounter for general adult medical examination without abnormal findings: Secondary | ICD-10-CM

## 2017-09-09 DIAGNOSIS — Z1212 Encounter for screening for malignant neoplasm of rectum: Secondary | ICD-10-CM

## 2017-09-09 DIAGNOSIS — F17200 Nicotine dependence, unspecified, uncomplicated: Secondary | ICD-10-CM

## 2017-09-09 DIAGNOSIS — N401 Enlarged prostate with lower urinary tract symptoms: Secondary | ICD-10-CM

## 2017-09-09 DIAGNOSIS — R059 Cough, unspecified: Secondary | ICD-10-CM

## 2017-09-09 DIAGNOSIS — I700 Atherosclerosis of aorta: Secondary | ICD-10-CM | POA: Insufficient documentation

## 2017-09-09 DIAGNOSIS — K219 Gastro-esophageal reflux disease without esophagitis: Secondary | ICD-10-CM

## 2017-09-09 DIAGNOSIS — R05 Cough: Secondary | ICD-10-CM

## 2017-09-09 NOTE — Progress Notes (Signed)
[facility]   Unk Pinto, M.D.     Uvaldo Bristle. Silverio Lay, P.A.-C Liane Comber, [place]                [address] [place], [place] [address] Telephone [phone] Telefax [phone] Annual  Screening/Preventative Visit  & Comprehensive Evaluation & Examination     This very nice 72 y.o. MWM presents for a Screening/Preventative Visit & comprehensive evaluation and management of multiple medical co-morbidities.  Patient has been followed for HTN, HLD, T2_NIDDM, BPH and Vitamin D Deficiency. Patient has BPH/LUTS controlled on therapy.      Patient also is c/o of a persistent cough with a 35+ pk yr smoking hx & alleging having "almost" quit for the last 2 months. CXR July 2018 showed chronic interstitial changes.         HTN predates circa 2005. Patient's BP has been controlled at home.  Today's BP is at goal - 122/80. Patient denies any cardiac symptoms as chest pain, palpitations, shortness of breath, dizziness or ankle swelling.     Patient's hyperlipidemia is not controlled with diet and medications. Patient denies myalgias or other medication SE's. Last lipids were  Lab Results  Component Value Date   CHOL 190 06/03/2017   HDL 35 (L) 06/03/2017   LDLCALC 120 (H) 06/03/2017   TRIG 234 (H) 06/03/2017   CHOLHDL 5.4 (H) 06/03/2017      Patient has T2_NIDDM (A1c 7.5% / 2010) and patient denies reactive hypoglycemic symptoms, visual blurring, diabetic polys or paresthesias.  Patient had been on Metformin and self d/c'd  Aug 2018 and reports CBG's in the range of 90-100. Last A1c was near goal: Lab Results  Component Value Date   HGBA1C 5.9 (H) 06/03/2017       Finally, patient has history of Vitamin D Deficiency ("23" /2008)  and last vitamin D was still not at goal (70-100): Lab Results  Component Value Date   VD25OH 51 06/03/2017   Current Outpatient Medications on File Prior to Visit   Medication Sig  . albuterol (PROVENTIL HFA;VENTOLIN HFA) 108 (90 Base) MCG/ACT inhaler Inhale 2 puffs into the lungs every 4 (four) hours as needed for wheezing or shortness of breath.  Marland Kitchen aspirin 81 MG tablet Take 81 mg by mouth daily.  Marland Kitchen atorvastatin (LIPITOR) 20 MG tablet Take 1 tablet daily  for Cholesterol  . baclofen (LIORESAL) 10 MG tablet Take 1 tablet (10 mg total) 2 (two) times daily by mouth. Take 1/2 to 1 tablet 2 x day if needed for muscle spasm  . Cholecalciferol (VITAMIN D) 2000 UNITS CAPS Take 1 capsule by mouth daily.  Marland Kitchen DILT-XR 240 MG 24 hr capsule TAKE 1 CAPSULE DAILY FOR BLOOD PRESSURE AND HEART BEAT  . finasteride (PROSCAR) 5 MG tablet TAKE 1 TABLET DAILY FOR PROSTATE  . losartan (COZAAR) 100 MG tablet TAKE 1 TABLET EVERY DAY FOR BLOOD PRESSURE  . magnesium gluconate (MAGONATE) 500 MG tablet Take 500 mg by mouth daily.   . pantoprazole (PROTONIX) 40 MG tablet TAKE 1 TABLET EVERY DAY  . ranitidine (ZANTAC) 300 MG tablet Take 1 to tablet daily for heartburn & reflux   No current facility-administered medications on file prior to visit.    Allergies  Allergen Reactions  . Ppd [Tuberculin Purified Protein Derivative]     Positive PPD.   Past Medical History:  Diagnosis Date  . Diabetes mellitus without complication ([place])   . Hyperlipidemia   . Hypertension   . Vitamin D deficiency    Health Maintenance  Topic Date Due  . FOOT EXAM  08/05/2017  . COLONOSCOPY  08/14/2017  . Hepatitis C Screening  01/25/2020 (Originally 11-25-1945)  . INFLUENZA VACCINE  10/24/2017  . HEMOGLOBIN A1C  12/04/2017  . OPHTHALMOLOGY EXAM  03/11/2018  . TETANUS/TDAP  08/07/2026  . PNA vac Low Risk Adult  Completed   Immunization History  Administered Date(s) Administered  . DTaP 05/23/2006  . Influenza Whole 12/16/2012  . Influenza, High Dose Seasonal PF 01/17/2015, 12/13/2015, 01/29/2017  . Influenza-Unspecified 12/27/2013  . Pneumococcal Conjugate-13 04/13/2014  . Pneumococcal  Polysaccharide-23 09/13/2008, 08/06/2016  . Td 08/06/2016  . Zoster 04/17/2013   Last Colon -  No past surgical history on file. Family History  Problem Relation Age of Onset  . Heart attack Mother   . Heart disease Mother   . Heart attack Brother   . Hypertension Brother   . Diabetes Sister   . Heart disease Sister   . Heart disease Father   . Heart attack Father    Social History   Socioeconomic History  . Marital status: Married    Spouse name:   . Number of children: 2  Occupational History  . Occupation: retired  Tobacco Use  . Smoking status: Current Every Day Smoker    Packs/day: 0.03    Years: 10.00    Pack years: 0.30    Types: Cigarettes  . Smokeless tobacco: Never Used  . Tobacco comment: smokes 4-5 cigarettes a week and uses E-cigarette  Substance and Sexual Activity  . Alcohol use: No  . Sexual activity: Active    ROS Constitutional: Denies fever, chills, weight loss/gain, headaches, insomnia,  night sweats or change in appetite. Does c/o fatigue. Eyes: Denies redness, blurred vision, diplopia, discharge, itchy or watery eyes.  ENT: Denies discharge, congestion, post nasal drip, epistaxis, sore throat, earache, hearing loss, dental pain, Tinnitus, Vertigo, Sinus pain or snoring.  Cardio: Denies chest pain, palpitations, irregular heartbeat, syncope, dyspnea, diaphoresis, orthopnea, PND, claudication or edema Respiratory: denies cough, dyspnea, DOE, pleurisy, hoarseness, laryngitis or wheezing.  Gastrointestinal: Denies dysphagia, heartburn, reflux, water brash, pain, cramps, nausea, vomiting, bloating, diarrhea, constipation, hematemesis, melena, hematochezia, jaundice or hemorrhoids Genitourinary: Denies dysuria, frequency, urgency, nocturia, hesitancy, discharge, hematuria or flank pain Musculoskeletal: Denies arthralgia, myalgia, stiffness, Jt. Swelling, pain, limp or strain/sprain. Denies Falls. Skin: Denies puritis, rash, hives, warts, acne, eczema or  change in skin lesion Neuro: No weakness, tremor, incoordination, spasms, paresthesia or pain Psychiatric: Denies confusion, memory loss or sensory loss. Denies Depression. Endocrine: Denies change in weight, skin, hair change, nocturia, and paresthesia, diabetic polys, visual blurring or hyper / hypo glycemic episodes.  Heme/Lymph: No excessive bleeding, bruising or enlarged lymph nodes.  Physical Exam  BP 122/80   Pulse 66   Temp (!) 97.4 F (36.3 C)   Resp 18   Ht 5' 9.5" (1.765 m)   Wt 150 lb (68 kg)   SpO2 97%   BMI 21.83 kg/m   General Appearance: Well nourished and well groomed and in no apparent distress.  Eyes: PERRLA, EOMs, conjunctiva no swelling or erythema, normal fundi and vessels. Sinuses: No frontal/maxillary tenderness ENT/Mouth: EACs patent / TMs  nl. Nares clear without erythema, swelling, mucoid exudates. Oral hygiene is good. No erythema, swelling, or exudate. Tongue normal, non-obstructing. Tonsils not swollen or erythematous. Hearing normal.  Neck:  Supple, thyroid not palpable. No bruits, nodes or JVD. Respiratory: Respiratory effort normal.  BS equal w/bilateral scattered rales and rhonci and few wheezing & no stridor. Cardio: Heart sounds are normal with regular rate and rhythm and no murmurs, rubs or gallops. Peripheral pulses are normal and equal bilaterally without edema. No aortic or femoral bruits. Chest: symmetric with normal excursions and percussion.  Abdomen: Soft, with Nl bowel sounds. Nontender, no guarding, rebound, hernias, masses, or organomegaly.  Lymphatics: Non tender without lymphadenopathy.  Genitourinary: No hernias.Testes nl. DRE - prostate nl for age - smooth & firm w/o nodules. Musculoskeletal: Full ROM all peripheral extremities, joint stability, 5/5 strength, and normal gait. Skin: Warm and dry without rashes, lesions, cyanosis, clubbing or  ecchymosis.  Neuro: Cranial nerves intact, reflexes equal bilaterally. Normal muscle tone, no  cerebellar symptoms. Sensation intact.  Pysch: Alert and oriented X 3 with normal affect, insight and judgment appropriate.   Assessment and Plan  1. Annual Preventative/Screening Exam   2. Essential hypertension  - EKG 12-Lead - [place], RETROPERITNL ABD,  LTD - Microalbumin / creatinine urine ratio - CBC with Differential/Platelet - COMPLETE METABOLIC PANEL WITH GFR - Magnesium - Urinalysis, Routine w reflex microscopic  3. Hyperlipidemia, mixed  - EKG 12-Lead - [place], RETROPERITNL ABD,  LTD - TSH  4. Diabetes mellitus without complication ([place])  - EKG 12-Lead - [place], RETROPERITNL ABD,  LTD - HM DIABETES FOOT EXAM - LOW EXTREMITY NEUR EXAM DOCUM - Hemoglobin A1c - Insulin, random  5. Vitamin D deficiency  - VITAMIN D 25 Hy                                                                                        6. Gastroesophageal reflux disease  7. Screening for colorectal cancer  - POC Hemoccult Bld/Stl - CBC with Differential/Platelet  8. BPH with urinary obstruction  - PSA  9. Prostate cancer screening  - PSA  10. Screening for ischemic heart disease  - EKG 12-Lead  11. FHx: heart disease  - EKG 12-Lead - [place], RETROPERITNL ABD,  LTD  12. Smoker / Cough  - Sx Trelegy x 6 weeks sx & ROV 2 weeks to reassess - CXR  - EKG 12-Lead - [place], RETROPERITNL ABD,  LTD  13. Aortic atherosclerosis ([place])  14. Medication management  - Microalbumin / creatinine urine ratio - CBC with Differential/Platelet - COMPLETE METABOLIC PANEL WITH GFR - Magnesium - TSH - Hemoglobin A1c - Insulin, random - VITAMIN D 25 Hydroxyl - Urinalysis, Routine w reflex microscopic         Patient was counseled in prudent diet, weight control to achieve/maintain BMI less than 25, BP monitoring, regular exercise and medications as discussed.  Discussed med effects and SE's. Routine screening labs and tests as requested with regular follow-up as recommended. Over 40 minutes of exam,  counseling, chart review and high complex critical decision making was performed

## 2017-09-09 NOTE — Patient Instructions (Signed)

## 2017-09-10 ENCOUNTER — Other Ambulatory Visit: Payer: Self-pay | Admitting: Internal Medicine

## 2017-09-10 LAB — URINALYSIS, ROUTINE W REFLEX MICROSCOPIC
Bilirubin Urine: NEGATIVE
Glucose, UA: NEGATIVE
HGB URINE DIPSTICK: NEGATIVE
KETONES UR: NEGATIVE
Leukocytes, UA: NEGATIVE
NITRITE: NEGATIVE
Protein, ur: NEGATIVE
SPECIFIC GRAVITY, URINE: 1.021 (ref 1.001–1.03)
pH: 6.5 (ref 5.0–8.0)

## 2017-09-10 LAB — MICROALBUMIN / CREATININE URINE RATIO
Creatinine, Urine: 125 mg/dL (ref 20–320)
MICROALB UR: 0.4 mg/dL
Microalb Creat Ratio: 3 mcg/mg creat (ref <30)

## 2017-09-11 ENCOUNTER — Ambulatory Visit (HOSPITAL_COMMUNITY)
Admission: RE | Admit: 2017-09-11 | Discharge: 2017-09-11 | Disposition: A | Discharge status: Home / Self Care | Payer: Medicare HMO | Source: Ambulatory Visit | Attending: Internal Medicine | Admitting: Internal Medicine

## 2017-09-11 DIAGNOSIS — R059 Cough, unspecified: Secondary | ICD-10-CM

## 2017-09-11 DIAGNOSIS — I10 Essential (primary) hypertension: Secondary | ICD-10-CM

## 2017-09-11 DIAGNOSIS — J449 Chronic obstructive pulmonary disease, unspecified: Secondary | ICD-10-CM | POA: Insufficient documentation

## 2017-09-11 DIAGNOSIS — F17200 Nicotine dependence, unspecified, uncomplicated: Secondary | ICD-10-CM | POA: Diagnosis not present

## 2017-09-11 DIAGNOSIS — R05 Cough: Secondary | ICD-10-CM | POA: Insufficient documentation

## 2017-09-18 DIAGNOSIS — Z1211 Encounter for screening for malignant neoplasm of colon: Secondary | ICD-10-CM | POA: Diagnosis not present

## 2017-09-18 DIAGNOSIS — Z1212 Encounter for screening for malignant neoplasm of rectum: Secondary | ICD-10-CM | POA: Diagnosis not present

## 2017-09-18 LAB — COLOGUARD: Cologuard: NEGATIVE

## 2017-09-23 ENCOUNTER — Ambulatory Visit (INDEPENDENT_AMBULATORY_CARE_PROVIDER_SITE_OTHER): Payer: Medicare HMO | Admitting: Internal Medicine

## 2017-09-23 VITALS — BP 114/64 | HR 64 | Temp 97.9°F | Resp 18 | Ht 69.5 in | Wt 150.8 lb

## 2017-09-23 DIAGNOSIS — J449 Chronic obstructive pulmonary disease, unspecified: Secondary | ICD-10-CM | POA: Diagnosis not present

## 2017-09-24 ENCOUNTER — Encounter: Payer: Self-pay | Admitting: Internal Medicine

## 2017-09-24 MED ORDER — PREDNISONE 20 MG PO TABS: ORAL_TABLET | ORAL | 0 refills | Status: DC

## 2017-09-24 MED ORDER — MONTELUKAST SODIUM 10 MG PO TABS: 10.0000 mg | ORAL_TABLET | Freq: Every day | ORAL | 3 refills | Status: DC

## 2017-09-24 NOTE — Progress Notes (Signed)
  Subjective:    Patient ID: Jesus Bush, male    DOB: 08-25-1945, 72 y.o.   MRN: [id number]  HPI    This 72 yo MWM with HTN, T2_DM, COPD returns for f/u of persistent cough. Recent CXR 6/19 showed COPD changes. Patient has also been using Trelegy elliptica/. He reports cough is persistent, dry - non-productive and he  is uncertain if he's wheezing. He is on Protonix & Ranitidine for GERD/reflux.  Medication Sig  . albuterol  HFA inhaler 2 puffs every 4 hours as needed   . aspirin 81 MG tablet Take daily.  Marland Kitchen atorvastatin 20 MG tablet Take 1 tablet daily  for Cholesterol  . baclofen  10 MG tablet Take 1/2 to 1 tablet 2 x day if needed for muscle spasm  . VITAMIN D 2000 UNITS  Take 1 cap daily.  Marland Kitchen DILT-XR 240 MG 24 hr cap TAKE 1 CAPSULE DAILY   . finasteride  5 MG tablet TAKE 1 TABLET DAILY   . Losartan 100 MG tablet TAKE 1 TABLET EVERY DAY  . magnesium  500 MG tablet Take  daily.   . pantoprazole40 MG tablet TAKE 1 TABLET EVERY DAY  . ranitidine300 MG tablet Take 1 to tablet daily for heartburn & reflux   Allergies  Allergen Reactions  . Ppd [Tuberculin Purified Protein Derivative]     Positive PPD.   Past Medical History:  Diagnosis Date  . Diabetes mellitus without complication ([place])   . Hyperlipidemia   . Hypertension   . Vitamin D deficiency    Review of Systems   10 point systems review negative except as above.    Objective:   Physical Exam  BP 114/64   Pulse 64   Temp 97.9 F (36.6 C)   Resp 18   Ht 5' 9.5" (1.765 m)   Wt 150 lb 12.8 oz (68.4 kg)   SpO2 97%   BMI 21.95 kg/m  re ck O2 sat = 94%.   No Distress/stridor. Dry hacking cough. No cyanosis, icterus, rash clubbing.   HEENT - WNL. Neck - supple.  Chest - Clear equal BS. Cor - Nl HS. RRR w/o sig MGR. PP 1(+). No edema. MS- FROM w/o deformities.  Gait Nl. Neuro -  Nl w/o focal abnormalities.    Assessment & Plan:   1. Chronic obstructive pulmonary disease, unspecified COPD type ([place])  -  Continue Trelegy - has about 4 -5 weeks more sx's.  2. Chronic asthmatic bronchitis ([facility])  - predniSONE (DELTASONE) 20 MG tablet; 1 tab 3 x day for 5 days, then  2 x day for 5 days, then  1 x day for 5 days, then 1 tablet every other day  Dispense: 40 tablet - montelukast (SINGULAIR) 10 MG tablet; Take 1 tablet (10 mg total) by mouth daily.  Dispense: 90 tablet; Refill: 3  - Discussed meds / SE's and ROV 2 weeks

## 2017-10-04 ENCOUNTER — Ambulatory Visit (INDEPENDENT_AMBULATORY_CARE_PROVIDER_SITE_OTHER): Payer: Medicare HMO

## 2017-10-04 ENCOUNTER — Encounter: Payer: Self-pay | Admitting: Physician Assistant

## 2017-10-04 ENCOUNTER — Other Ambulatory Visit: Payer: Self-pay

## 2017-10-04 ENCOUNTER — Ambulatory Visit (INDEPENDENT_AMBULATORY_CARE_PROVIDER_SITE_OTHER): Payer: Medicare HMO | Admitting: Physician Assistant

## 2017-10-04 VITALS — BP 120/58 | HR 72 | Temp 98.1°F | Resp 16 | Ht 69.5 in

## 2017-10-04 DIAGNOSIS — S61210A Laceration without foreign body of right index finger without damage to nail, initial encounter: Secondary | ICD-10-CM

## 2017-10-04 MED ORDER — CEPHALEXIN 500 MG PO CAPS: 500.0000 mg | ORAL_CAPSULE | Freq: Three times a day (TID) | ORAL | 0 refills | Status: AC

## 2017-10-04 MED ORDER — TRAMADOL HCL 50 MG PO TABS
50.0000 mg | ORAL_TABLET | Freq: Three times a day (TID) | ORAL | 0 refills | Status: DC | PRN
Start: 2017-10-04 — End: 2017-12-17

## 2017-10-04 NOTE — Progress Notes (Signed)
10/04/2017 4:02 PM   DOB: 01-Aug-1945 / MRN: [id number]  SUBJECTIVE:  Jesus Bush is a 72 y.o. male presenting for finger laceration about the right index. Symptoms present for about an hour.  The problem is no better or worse. He has tried coming here immediately.  He denies weakness about the finger and tells me "its working fine."  He is allergic to ppd [tuberculin purified protein derivative].   He  has a past medical history of Diabetes mellitus without complication ([place]), Hyperlipidemia, Hypertension, and Vitamin D deficiency.    He  reports that he has been smoking cigarettes.  He has a 0.30 pack-year smoking history. He has never used smokeless tobacco. He reports that he does not drink alcohol or use drugs. He  has no sexual activity history on file. The patient  has no past surgical history on file.  His family history includes Diabetes in his sister; Heart attack in his brother, father, and mother; Heart disease in his father, mother, and sister; Hypertension in his brother.  Review of Systems  Neurological: Negative for focal weakness and weakness.  Endo/Heme/Allergies: Does not bruise/bleed easily.    The problem list and medications were reviewed and updated by myself where necessary and exist elsewhere in the encounter.   OBJECTIVE:  BP (!) 120/58   Pulse 72   Temp 98.1 F (36.7 C)   Resp 16   Ht 5' 9.5" (1.765 m)   SpO2 96%   BMI 21.95 kg/m   Wt Readings from Last 3 Encounters:  09/23/17 150 lb 12.8 oz (68.4 kg)  09/09/17 150 lb (68 kg)  06/03/17 155 lb (70.3 kg)   Temp Readings from Last 3 Encounters:  10/04/17 98.1 F (36.7 C)  09/23/17 97.9 F (36.6 C)  09/09/17 (!) 97.4 F (36.3 C)   BP Readings from Last 3 Encounters:  10/04/17 (!) 120/58  09/23/17 114/64  09/09/17 122/80   Pulse Readings from Last 3 Encounters:  10/04/17 72  09/23/17 64  09/09/17 66    Physical Exam  Constitutional: He is oriented to person, place, and time. He  appears well-developed. He does not appear ill.  Eyes: Pupils are equal, round, and reactive to light. Conjunctivae and EOM are normal.  Cardiovascular: Normal rate.  Pulmonary/Chest: Effort normal.  Abdominal: He exhibits no distension.  Musculoskeletal: Normal range of motion. He exhibits no deformity.       Hands: Neurological: He is alert and oriented to person, place, and time. No cranial nerve deficit. Coordination normal.  Skin: Skin is warm and dry. He is not diaphoretic.  Psychiatric: He has a normal mood and affect.  Nursing note and vitals reviewed.   Lab Results  Component Value Date   HGBA1C 5.9 (H) 09/09/2017    Lab Results  Component Value Date   WBC 8.5 09/09/2017   HGB 14.6 09/09/2017   HCT 42.7 09/09/2017   MCV 92.6 09/09/2017   PLT 325 09/09/2017    Lab Results  Component Value Date   CREATININE 0.96 09/09/2017   BUN 15 09/09/2017   NA 139 09/09/2017   K 4.3 09/09/2017   CL 101 09/09/2017   CO2 29 09/09/2017    Lab Results  Component Value Date   ALT 11 09/09/2017   AST 12 09/09/2017   ALKPHOS 85 08/06/2016   BILITOT 0.3 09/09/2017    Lab Results  Component Value Date   TSH 0.36 (L) 09/09/2017    Lab Results  Component Value  Date   CHOL 190 06/03/2017   HDL 35 (L) 06/03/2017   LDLCALC 120 (H) 06/03/2017   TRIG 234 (H) 06/03/2017   CHOLHDL 5.4 (H) 06/03/2017     ASSESSMENT AND PLAN:  Chesney was seen today for laceration.  Diagnoses and all orders for this visit:  Laceration of index finger of right hand without complication -     DG Finger Index Right -     cephALEXin (KEFLEX) 500 MG capsule; Take 1 capsule (500 mg total) by mouth 3 (three) times daily for 7 days. -     traMADol (ULTRAM) 50 MG tablet; Take 1 tablet (50 mg total) by mouth every 8 (eight) hours as needed.    The patient is advised to call or return to clinic if he does not see an improvement in symptoms, or to seek the care of the closest emergency department if  he worsens with the above plan.   Philis Fendt, MHS, PA-C [facility] 10/04/2017 4:02 PM

## 2017-10-04 NOTE — Patient Instructions (Addendum)
Do not remove the splint so we can protect the suture line.   WOUND CARE Please return in 10 days to have your stitches/staples removed or sooner if you have concerns. Marland Kitchen Keep area clean and dry for 24 hours. Do not remove bandage, if applied. . After 24 hours, remove bandage and wash wound gently with mild soap and warm water. Reapply a new bandage after cleaning wound, if directed. . Continue daily cleansing with soap and water until stitches/staples are removed. . Do not apply any ointments or creams to the wound while stitches/staples are in place, as this may cause delayed healing. . Notify the office if you experience any of the following signs of infection: Swelling, redness, pus drainage, streaking, fever >101.0 F . Notify the office if you experience excessive bleeding that does not stop after 15-20 minutes of constant, firm pressure.    IF you received an x-ray today, you will receive an invoice from [facility]. Please contact [facility] at [phone] with questions or concerns regarding your invoice.   IF you received labwork today, you will receive an invoice from [facility]. Please contact [facility] at [phone] with questions or concerns regarding your invoice.   Our billing staff will not be able to assist you with questions regarding bills from these companies.  You will be contacted with the lab results as soon as they are available. The fastest way to get your results is to activate your My Chart account. Instructions are located on the last page of this paperwork. If you have not heard from [place] regarding the results in 2 weeks, please contact this office.

## 2017-10-08 ENCOUNTER — Encounter: Payer: Self-pay | Admitting: *Deleted

## 2017-10-08 ENCOUNTER — Ambulatory Visit (INDEPENDENT_AMBULATORY_CARE_PROVIDER_SITE_OTHER): Payer: Medicare HMO | Admitting: Internal Medicine

## 2017-10-08 VITALS — BP 114/68 | HR 72 | Temp 97.7°F | Resp 16 | Ht 69.5 in | Wt 154.0 lb

## 2017-10-08 DIAGNOSIS — J449 Chronic obstructive pulmonary disease, unspecified: Secondary | ICD-10-CM | POA: Diagnosis not present

## 2017-10-08 NOTE — Progress Notes (Signed)
  Subjective:    Patient ID: Jesus Bush, male    DOB: 1945/04/27, 72 y.o.   MRN: [id number]  HPI   Patient is a nice 72 yo MWM seen recently on 09/23/17 with [facility] -returns for f/u after treatment with Trelegy and has been started on Singulair. He was treated with a prednisone pulse followed by taper to qod and presents now much impoved reporting no more apparent wheezing. No cough or sputum production.  Medication Sig  . albuterol (HFA  inhaler 2 puffs s every 4 hours as needed   . aspirin 81 MG tablet Take  daily.  Marland Kitchen atorvastatin  20 MG tablet Take 1 tablet daily  for Cholesterol  . baclofen  10 MG tablet Take 1/2 to 1 tablet 2 x day if needed for muscle spasm  . VITAMIN D 2000 UNITS  Take 1 capsule daily.  Marland Kitchen DILT-XR 240 MG 24 hr cap TAKE 1 CAPSULE DAILY  . finasteride 5 MG tablet TAKE 1 TABLET DAILY   . losartan 100 MG tablet TAKE 1 TABLET EVERY DAY   . magnesium 500 MG tablet Take 500 mg by mouth daily.   . montelukast  10 MG tablet Take 1 tablet (10 mg total) by mouth daily.  . pantoprazole  40 MG tablet TAKE 1 TABLET EVERY DAY  . ranitidine  300 MG tablet Take 1 to tablet daily for heartburn & reflux  . traMADol  50 MG tablet Take 1 tablet  every 8  hours as needed.   Allergies  Allergen Reactions  . Ppd [Tuberculin Purified Protein Derivative]     Positive PPD.   Past Medical History:  Diagnosis Date  . Diabetes mellitus without complication ([place])   . Hyperlipidemia   . Hypertension   . Vitamin D deficiency    Review of Systems   10 point systems review negative except as above.    Objective:   Physical Exam  BP 114/68   Pulse 72   Temp 97.7 F (36.5 C)   Resp 16   Ht 5' 9.5" (1.765 m)   Wt 154 lb (69.9 kg)   BMI 22.42 kg/m   No stridor   HEENT - WNL. Neck - supple.  Chest - Clear equal BS. Cor - Nl HS. RRR w/o sig MGR. PP 1(+). No edema. MS- FROM w/o deformities.  Gait Nl. Neuro -  Nl w/o focal abnormalities.    Assessment & Plan:   1. Chronic  asthmatic bronchitis ([place])  - discussed meds / Side effects and smoking cessation

## 2017-10-09 ENCOUNTER — Encounter: Payer: Self-pay | Admitting: Internal Medicine

## 2017-10-14 ENCOUNTER — Ambulatory Visit (INDEPENDENT_AMBULATORY_CARE_PROVIDER_SITE_OTHER): Payer: Medicare HMO | Admitting: Urgent Care

## 2017-10-14 ENCOUNTER — Encounter: Payer: Self-pay | Admitting: Urgent Care

## 2017-10-14 DIAGNOSIS — S61210D Laceration without foreign body of right index finger without damage to nail, subsequent encounter: Secondary | ICD-10-CM

## 2017-10-14 DIAGNOSIS — Z4802 Encounter for removal of sutures: Secondary | ICD-10-CM

## 2017-10-14 DIAGNOSIS — S61210A Laceration without foreign body of right index finger without damage to nail, initial encounter: Secondary | ICD-10-CM

## 2017-10-14 NOTE — Progress Notes (Signed)
   Patient: STEFON RAMTHUN [id number]  Subjective:  Bhargav is returning for suture removal. Patient was initially seen 10/04/2017 and had sutures placed over right index finger.  Reports that he has kept his wound covered and splinted since his office visit.  Denies fever, drainage of pus or blood, wound dehiscence, edema, pain.   Objective:   Physical Exam  Constitutional: He appears well-developed and well-nourished.  Pulmonary/Chest: Effort normal.  Musculoskeletal:       Right hand: He exhibits laceration.       Hands: Skin: Skin is warm and dry.  Psychiatric: He has a normal mood and affect.   Sutures removed without incident or wound dehiscence.  3 half-inch Steri-Strips were applied across his laceration.  Assessment and Plan: Wound is still healing.  Counseled patient on wound care including application of Steri-Strips.  Return to clinic precautions reviewed.  Jaynee Eagles, PA-C [facility] [phone] 10/14/2017  1:57 PM

## 2017-10-14 NOTE — Patient Instructions (Addendum)
Suture Removal, Care After Refer to this sheet in the next few weeks. These instructions provide you with information on caring for yourself after your procedure. Your health care provider may also give you more specific instructions. Your treatment has been planned according to current medical practices, but problems sometimes occur. Call your health care provider if you have any problems or questions after your procedure. What can I expect after the procedure? After your stitches (sutures) are removed, it is typical to have the following:  Some discomfort and swelling in the wound area.  Slight redness in the area.  Follow these instructions at home:  If you have skin adhesive strips over the wound area, do not take the strips off. They will fall off on their own in a few days. If the strips remain in place after 14 days, you may remove them.  Change any bandages (dressings) at least once a day or as directed by your health care provider. If the bandage sticks, soak it off with warm, soapy water.  Apply cream or ointment only as directed by your health care provider. If using cream or ointment, wash the area with soap and water 2 times a day to remove all the cream or ointment. Rinse off the soap and pat the area dry with a clean towel.  Keep the wound area dry and clean. If the bandage becomes wet or dirty, or if it develops a bad smell, change it as soon as possible.  Continue to protect the wound from injury.  Use sunscreen when out in the sun. New scars become sunburned easily. Contact a health care provider if:  You have increasing redness, swelling, or pain in the wound.  You see pus coming from the wound.  You have a fever.  You notice a bad smell coming from the wound or dressing.  Your wound breaks open (edges not staying together). This information is not intended to replace advice given to you by your health care provider. Make sure you discuss any questions you have  with your health care provider. Document Released: 12/05/2000 Document Revised: 08/18/2015 Document Reviewed: 10/22/2012 Elsevier Interactive Patient Education  2017 [employer]vier [employer]     IF you received an x-ray today, you will receive an invoice from [facility]. Please contact [facility] at [phone] with questions or concerns regarding your invoice.   IF you received labwork today, you will receive an invoice from [facility]. Please contact [facility] at [phone] with questions or concerns regarding your invoice.   Our billing staff will not be able to assist you with questions regarding bills from these companies.  You will be contacted with the lab results as soon as they are available. The fastest way to get your results is to activate your My Chart account. Instructions are located on the last page of this paperwork. If you have not heard from us regarding the results in 2 weeks, please contact this office.      

## 2017-10-21 ENCOUNTER — Other Ambulatory Visit: Payer: Self-pay | Admitting: Internal Medicine

## 2017-10-21 DIAGNOSIS — J449 Chronic obstructive pulmonary disease, unspecified: Secondary | ICD-10-CM

## 2017-10-22 ENCOUNTER — Encounter (INDEPENDENT_AMBULATORY_CARE_PROVIDER_SITE_OTHER): Payer: Self-pay

## 2017-10-30 ENCOUNTER — Other Ambulatory Visit: Payer: Self-pay | Admitting: Physician Assistant

## 2017-11-05 ENCOUNTER — Other Ambulatory Visit: Payer: Self-pay | Admitting: Internal Medicine

## 2017-11-05 DIAGNOSIS — K219 Gastro-esophageal reflux disease without esophagitis: Secondary | ICD-10-CM

## 2017-11-05 MED ORDER — BACLOFEN 10 MG PO TABS: ORAL_TABLET | ORAL | 0 refills | Status: DC

## 2017-11-05 MED ORDER — LOSARTAN POTASSIUM 100 MG PO TABS: ORAL_TABLET | ORAL | 1 refills | Status: DC

## 2017-11-06 ENCOUNTER — Other Ambulatory Visit: Payer: Self-pay | Admitting: *Deleted

## 2017-11-06 MED ORDER — GLUCOSE BLOOD VI STRP: ORAL_STRIP | 3 refills | Status: DC

## 2017-11-06 MED ORDER — ACCU-CHEK SOFTCLIX LANCETS MISC: 3 refills | Status: DC

## 2017-11-06 MED ORDER — BD SWAB SINGLE USE REGULAR PADS: MEDICATED_PAD | 3 refills | Status: DC

## 2017-11-06 MED ORDER — ACCU-CHEK AVIVA VI SOLN: 3 refills | Status: DC

## 2017-11-06 MED ORDER — ACCU-CHEK AVIVA PLUS W/DEVICE KIT: PACK | 0 refills | Status: DC

## 2017-11-13 ENCOUNTER — Other Ambulatory Visit: Payer: Self-pay | Admitting: *Deleted

## 2017-11-13 MED ORDER — BACLOFEN 10 MG PO TABS: ORAL_TABLET | ORAL | 0 refills | Status: DC

## 2017-11-13 MED ORDER — LOSARTAN POTASSIUM 100 MG PO TABS: ORAL_TABLET | ORAL | 1 refills | Status: DC

## 2017-12-09 DIAGNOSIS — Z961 Presence of intraocular lens: Secondary | ICD-10-CM | POA: Diagnosis not present

## 2017-12-09 DIAGNOSIS — T1500XA Foreign body in cornea, unspecified eye, initial encounter: Secondary | ICD-10-CM | POA: Diagnosis not present

## 2017-12-17 ENCOUNTER — Ambulatory Visit (INDEPENDENT_AMBULATORY_CARE_PROVIDER_SITE_OTHER): Payer: Medicare HMO | Admitting: Internal Medicine

## 2017-12-17 ENCOUNTER — Ambulatory Visit: Payer: Self-pay | Admitting: Adult Health

## 2017-12-17 ENCOUNTER — Other Ambulatory Visit: Payer: Self-pay | Admitting: *Deleted

## 2017-12-17 ENCOUNTER — Encounter: Payer: Self-pay | Admitting: Internal Medicine

## 2017-12-17 VITALS — BP 118/74 | HR 72 | Temp 97.3°F | Resp 18 | Ht 69.5 in | Wt 149.4 lb

## 2017-12-17 DIAGNOSIS — E559 Vitamin D deficiency, unspecified: Secondary | ICD-10-CM | POA: Diagnosis not present

## 2017-12-17 DIAGNOSIS — K219 Gastro-esophageal reflux disease without esophagitis: Secondary | ICD-10-CM | POA: Diagnosis not present

## 2017-12-17 DIAGNOSIS — E782 Mixed hyperlipidemia: Secondary | ICD-10-CM | POA: Diagnosis not present

## 2017-12-17 DIAGNOSIS — J449 Chronic obstructive pulmonary disease, unspecified: Secondary | ICD-10-CM | POA: Diagnosis not present

## 2017-12-17 DIAGNOSIS — Z79899 Other long term (current) drug therapy: Secondary | ICD-10-CM | POA: Diagnosis not present

## 2017-12-17 DIAGNOSIS — E119 Type 2 diabetes mellitus without complications: Secondary | ICD-10-CM | POA: Diagnosis not present

## 2017-12-17 DIAGNOSIS — I10 Essential (primary) hypertension: Secondary | ICD-10-CM | POA: Diagnosis not present

## 2017-12-17 DIAGNOSIS — Z23 Encounter for immunization: Secondary | ICD-10-CM | POA: Diagnosis not present

## 2017-12-17 MED ORDER — PREDNISONE 20 MG PO TABS: ORAL_TABLET | ORAL | 0 refills | Status: DC

## 2017-12-17 MED ORDER — ALBUTEROL SULFATE HFA 108 (90 BASE) MCG/ACT IN AERS: 2.0000 | INHALATION_SPRAY | RESPIRATORY_TRACT | 0 refills | Status: DC | PRN

## 2017-12-17 NOTE — Patient Instructions (Signed)

## 2017-12-17 NOTE — Progress Notes (Signed)
This very nice 72 y.o. MWM  presents for 6 month follow up with HTN, HLD, oBPH/LUTS,  T2_NIDDM and Vitamin D Deficiency.      Patient has COPD with a >35 yr smoking hx w/CXR in July 2018  showing chronic interstitial changes. More recent CXR in June showed stable COPD.      Patient is treated for HTN & BP has been controlled at home. Today's BP is at goal -  118/74. Patient has had no complaints of any cardiac type chest pain, palpitations, dyspnea / orthopnea / PND, dizziness, claudication, or dependent edema.     Hyperlipidemia is not controlled with diet & meds. Last Lipids were not at goal when patient was recently off of meds: Lab Results  Component Value Date   CHOL 190 06/03/2017   HDL 35 (L) 06/03/2017   LDLCALC 120 (H) 06/03/2017   TRIG 234 (H) 06/03/2017   CHOLHDL 5.4 (H) 06/03/2017      Also, the patient has history of T2_NIDDM (A1c 7.5%/2010)  and has had no symptoms of reactive hypoglycemia, diabetic polys, paresthesias or visual blurring.  He has attempted control with diet and last A1c was near goal: Lab Results  Component Value Date   HGBA1C 5.9 (H) 09/09/2017      Further, the patient also has history of Vitamin D Deficiency("23" /2008)and supplements vitamin D without any suspected side-effects. Last vitamin D was at goal: Lab Results  Component Value Date   VD25OH 59 09/09/2017   Current Outpatient Medications on File Prior to Visit  Medication Sig  . ACCU-CHEK SOFTCLIX LANCETS lancets Check blood sugar 1 time daily-DX-R73.09  . Alcohol Swabs (B-D SINGLE USE SWABS REGULAR) PADS Check blood sugar 1 time daily-DX-R73.09.  Marland Kitchen aspirin 81 MG tablet Take 81 mg by mouth daily.  Marland Kitchen atorvastatin (LIPITOR) 20 MG tablet Take 1 tablet daily  for Cholesterol  . Blood Glucose Calibration (ACCU-CHEK AVIVA) SOLN Use per box instruction.  . Blood Glucose Monitoring Suppl (ACCU-CHEK AVIVA PLUS) w/Device KIT Check blood sugar 1 time daily-DX-R73.09  . Cholecalciferol (VITAMIN  D) 2000 UNITS CAPS Take 1 capsule by mouth daily.  Marland Kitchen DILT-XR 240 MG 24 hr capsule TAKE 1 CAPSULE DAILY FOR BLOOD PRESSURE AND HEART BEAT  . finasteride (PROSCAR) 5 MG tablet TAKE 1 TABLET DAILY FOR PROSTATE  . glucose blood (ACCU-CHEK AVIVA PLUS) test strip Check blood sugar 1 time daily-R73.09.  . losartan (COZAAR) 100 MG tablet Take 1 tablet daily for BP  . magnesium gluconate (MAGONATE) 500 MG tablet Take 500 mg by mouth daily.   . montelukast (SINGULAIR) 10 MG tablet Take 1 tablet (10 mg total) by mouth daily.  . ranitidine (ZANTAC) 300 MG tablet Take 1 to tablet daily for heartburn & reflux   No current facility-administered medications on file prior to visit.    Allergies  Allergen Reactions  . Ppd [Tuberculin Purified Protein Derivative]     Positive PPD.   PMHx:   Past Medical History:  Diagnosis Date  . Diabetes mellitus without complication ([place])   . Hyperlipidemia   . Hypertension   . Vitamin D deficiency    Immunization History  Administered Date(s) Administered  . DTaP 05/23/2006  . Influenza Whole 12/16/2012  . Influenza, High Dose Seasonal PF 01/17/2015, 12/13/2015, 01/29/2017  . Influenza-Unspecified 12/27/2013  . Pneumococcal Conjugate-13 04/13/2014  . Pneumococcal Polysaccharide-23 09/13/2008, 08/06/2016  . Td 08/06/2016  . Zoster 04/17/2013   History reviewed. No pertinent surgical history.  FHx:    Reviewed / unchanged  SHx:    Reviewed / unchanged   Systems Review:  Constitutional: Denies fever, chills, wt changes, headaches, insomnia, fatigue, night sweats, change in appetite. Eyes: Denies redness, blurred vision, diplopia, discharge, itchy, watery eyes.  ENT: Denies discharge, congestion, post nasal drip, epistaxis, sore throat, earache, hearing loss, dental pain, tinnitus, vertigo, sinus pain, snoring.  CV: Denies chest pain, palpitations, irregular heartbeat, syncope, dyspnea, diaphoresis, orthopnea, PND, claudication or edema. Respiratory:  denies cough, dyspnea, DOE, pleurisy, hoarseness, laryngitis, wheezing.  Gastrointestinal: Denies dysphagia, odynophagia, heartburn, reflux, water brash, abdominal pain or cramps, nausea, vomiting, bloating, diarrhea, constipation, hematemesis, melena, hematochezia  or hemorrhoids. Genitourinary: Denies dysuria, frequency, urgency, nocturia, hesitancy, discharge, hematuria or flank pain. Musculoskeletal: Denies arthralgias, myalgias, stiffness, jt. swelling, pain, limping or strain/sprain.  Skin: Denies pruritus, rash, hives, warts, acne, eczema or change in skin lesion(s). Neuro: No weakness, tremor, incoordination, spasms, paresthesia or pain. Psychiatric: Denies confusion, memory loss or sensory loss. Endo: Denies change in weight, skin or hair change.  Heme/Lymph: No excessive bleeding, bruising or enlarged lymph nodes.  Physical Exam  BP 118/74   Pulse 72   Temp (!) 97.3 F (36.3 C)   Resp 18   Ht 5' 9.5" (1.765 m)   Wt 149 lb 6.4 oz (67.8 kg)   BMI 21.75 kg/m   Appears  well nourished, well groomed  and in no distress.  Eyes: PERRLA, EOMs, conjunctiva no swelling or erythema. Sinuses: No frontal/maxillary tenderness ENT/Mouth: EAC's clear, TM's nl w/o erythema, bulging. Nares clear w/o erythema, swelling, exudates. Oropharynx clear without erythema or exudates. Oral hygiene is good. Tongue normal, non obstructing. Hearing intact.  Neck: Supple. Thyroid not palpable. Car 2+/2+ without bruits, nodes or JVD. Chest: Respirations nl with BS clear & equal w/o rales, rhonchi, wheezing or stridor.  Cor: Heart sounds normal w/ regular rate and rhythm without sig. murmurs, gallops, clicks or rubs. Peripheral pulses normal and equal  without edema.  Abdomen: Soft & bowel sounds normal. Non-tender w/o guarding, rebound, hernias, masses or organomegaly.  Lymphatics: Unremarkable.  Musculoskeletal: Full ROM all peripheral extremities, joint stability, 5/5 strength and normal gait.  Skin:  Warm, dry without exposed rashes, lesions or ecchymosis apparent.  Neuro: Cranial nerves intact, reflexes equal bilaterally. Sensory-motor testing grossly intact. Tendon reflexes grossly intact.  Pysch: Alert & oriented x 3.  Insight and judgement nl & appropriate. No ideations.  Assessment and Plan:  1. Essential hypertension  - Continue medication, monitor blood pressure at home.  - Continue DASH diet.  Reminder to go to the ER if any CP,  SOB, nausea, dizziness, severe HA, changes vision/speech.  - CBC with Differential/Platelet - COMPLETE METABOLIC PANEL WITH GFR - Magnesium - TSH  2. Hyperlipidemia, mixed  - Continue diet/meds, exercise,& lifestyle modifications.  - Continue monitor periodic cholesterol/liver & renal functions   - Lipid panel - TSH  3. Diabetes mellitus without complication ([place])  - Continue diet, exercise, lifestyle modifications.  - Monitor appropriate labs.  - Hemoglobin A1c - Insulin, random  4. Vitamin D deficiency  - Continue supplementation.   - VITAMIN D 25 Hydroxyl  5. Gastroesophageal reflux disease  - CBC with Differential/Platelet  6. Medication management  - CBC with Differential/Platelet - COMPLETE METABOLIC PANEL WITH GFR - Magnesium - Lipid panel - TSH - Hemoglobin A1c - Insulin, random - VITAMIN D 25 Hydroxyl  7. Asthmatic bronchitis , chronic ([facility])  - predniSONE 20 MG tablet; 1 tab 3 x day  for 3 days, then 1 tab 2 x day for 3 days, then 1 tab 1 x day for 5 days  Dispense: 20 tablet  8. Need for prophylactic vaccination and inoculation against influenza  - Flu vaccine HIGH DOSE PF (Fluzone High dose)      Discussed  regular exercise, BP monitoring, weight control to achieve/maintain BMI less than 25 and discussed med and SE's. Recommended labs to assess and monitor clinical status with further disposition pending results of labs. Over 30 minutes of exam, counseling, chart review was performed.

## 2017-12-18 ENCOUNTER — Other Ambulatory Visit: Payer: Self-pay | Admitting: Internal Medicine

## 2017-12-18 DIAGNOSIS — E782 Mixed hyperlipidemia: Secondary | ICD-10-CM

## 2017-12-18 LAB — MAGNESIUM: Magnesium: 1.8 mg/dL (ref 1.5–2.5)

## 2017-12-18 LAB — CBC WITH DIFFERENTIAL/PLATELET
BASOS PCT: 0.8 %
Basophils Absolute: 68 cells/uL (ref 0–200)
EOS ABS: 119 {cells}/uL (ref 15–500)
Eosinophils Relative: 1.4 %
HCT: 42.1 % (ref 38.5–50.0)
Hemoglobin: 14.5 g/dL (ref 13.2–17.1)
Lymphs Abs: 2040 cells/uL (ref 850–3900)
MCH: 32.1 pg (ref 27.0–33.0)
MCHC: 34.4 g/dL (ref 32.0–36.0)
MCV: 93.1 fL (ref 80.0–100.0)
MPV: 10.8 fL (ref 7.5–12.5)
Monocytes Relative: 6.3 %
Neutro Abs: 5738 cells/uL (ref 1500–7800)
Neutrophils Relative %: 67.5 %
Platelets: 324 10*3/uL (ref 140–400)
RBC: 4.52 10*6/uL (ref 4.20–5.80)
RDW: 12.5 % (ref 11.0–15.0)
TOTAL LYMPHOCYTE: 24 %
WBC mixed population: 536 cells/uL (ref 200–950)
WBC: 8.5 10*3/uL (ref 3.8–10.8)

## 2017-12-18 LAB — LIPID PANEL
Cholesterol: 182 mg/dL (ref <200)
HDL: 36 mg/dL — ABNORMAL LOW (ref >40)
LDL Cholesterol (Calc): 112 mg/dL (calc) — ABNORMAL HIGH
Non-HDL Cholesterol (Calc): 146 mg/dL (calc) — ABNORMAL HIGH (ref <130)
TRIGLYCERIDES: 220 mg/dL — AB (ref <150)
Total CHOL/HDL Ratio: 5.1 (calc) — ABNORMAL HIGH (ref <5.0)

## 2017-12-18 LAB — COMPLETE METABOLIC PANEL WITH GFR
AG Ratio: 1.9 (calc) (ref 1.0–2.5)
ALBUMIN MSPROF: 4.4 g/dL (ref 3.6–5.1)
ALT: 10 U/L (ref 9–46)
AST: 11 U/L (ref 10–35)
Alkaline phosphatase (APISO): 103 U/L (ref 40–115)
BILIRUBIN TOTAL: 0.4 mg/dL (ref 0.2–1.2)
BUN: 13 mg/dL (ref 7–25)
CHLORIDE: 101 mmol/L (ref 98–110)
CO2: 27 mmol/L (ref 20–32)
Calcium: 9.9 mg/dL (ref 8.6–10.3)
Creat: 1.02 mg/dL (ref 0.70–1.18)
GFR, EST AFRICAN AMERICAN: 85 mL/min/{1.73_m2} (ref >=60)
GFR, Est Non African American: 73 mL/min/{1.73_m2} (ref >=60)
GLOBULIN: 2.3 g/dL (ref 1.9–3.7)
Glucose, Bld: 119 mg/dL — ABNORMAL HIGH (ref 65–99)
POTASSIUM: 4.1 mmol/L (ref 3.5–5.3)
Sodium: 137 mmol/L (ref 135–146)
TOTAL PROTEIN: 6.7 g/dL (ref 6.1–8.1)

## 2017-12-18 LAB — TSH: TSH: 0.4 mIU/L (ref 0.40–4.50)

## 2017-12-18 LAB — HEMOGLOBIN A1C
EAG (MMOL/L): 6.5 (calc)
Hgb A1c MFr Bld: 5.7 % of total Hgb — ABNORMAL HIGH (ref <5.7)
MEAN PLASMA GLUCOSE: 117 (calc)

## 2017-12-18 LAB — VITAMIN D 25 HYDROXY (VIT D DEFICIENCY, FRACTURES): Vit D, 25-Hydroxy: 56 ng/mL (ref 30–100)

## 2017-12-18 LAB — INSULIN, RANDOM: INSULIN: 5.8 u[IU]/mL (ref 2.0–19.6)

## 2017-12-18 MED ORDER — ATORVASTATIN CALCIUM 20 MG PO TABS: ORAL_TABLET | ORAL | 3 refills | Status: DC

## 2017-12-19 ENCOUNTER — Other Ambulatory Visit: Payer: Self-pay | Admitting: Internal Medicine

## 2017-12-19 ENCOUNTER — Telehealth: Payer: Self-pay | Admitting: *Deleted

## 2017-12-19 MED ORDER — AZITHROMYCIN 250 MG PO TABS: ORAL_TABLET | ORAL | 1 refills | Status: DC

## 2017-12-19 MED ORDER — ONDANSETRON 8 MG PO TBDP: ORAL_TABLET | ORAL | 0 refills | Status: DC

## 2017-12-19 NOTE — Telephone Encounter (Signed)
Patient's spouse called and was informed Dr Melford Aase has sent an RX for a Z-pak and Zofran for the patient's nausea and congestion.

## 2017-12-21 ENCOUNTER — Encounter: Payer: Self-pay | Admitting: Internal Medicine

## 2017-12-24 LAB — CBC WITH DIFFERENTIAL/PLATELET
Basophils Absolute: 68 cells/uL (ref 0–200)
Basophils Relative: 0.8 %
EOS ABS: 60 {cells}/uL (ref 15–500)
Eosinophils Relative: 0.7 %
HEMATOCRIT: 42.7 % (ref 38.5–50.0)
Hemoglobin: 14.6 g/dL (ref 13.2–17.1)
LYMPHS ABS: 1615 {cells}/uL (ref 850–3900)
MCH: 31.7 pg (ref 27.0–33.0)
MCHC: 34.2 g/dL (ref 32.0–36.0)
MCV: 92.6 fL (ref 80.0–100.0)
MPV: 10.9 fL (ref 7.5–12.5)
Monocytes Relative: 5.7 %
NEUTROS PCT: 73.8 %
Neutro Abs: 6273 cells/uL (ref 1500–7800)
Platelets: 325 10*3/uL (ref 140–400)
RBC: 4.61 10*6/uL (ref 4.20–5.80)
RDW: 12.3 % (ref 11.0–15.0)
TOTAL LYMPHOCYTE: 19 %
WBC: 8.5 10*3/uL (ref 3.8–10.8)
WBCMIX: 485 {cells}/uL (ref 200–950)

## 2017-12-24 LAB — COMPLETE METABOLIC PANEL WITH GFR
AG RATIO: 1.9 (calc) (ref 1.0–2.5)
ALBUMIN MSPROF: 4.6 g/dL (ref 3.6–5.1)
ALKALINE PHOSPHATASE (APISO): 103 U/L (ref 40–115)
ALT: 11 U/L (ref 9–46)
AST: 12 U/L (ref 10–35)
BUN: 15 mg/dL (ref 7–25)
CO2: 29 mmol/L (ref 20–32)
CREATININE: 0.96 mg/dL (ref 0.70–1.18)
Calcium: 10 mg/dL (ref 8.6–10.3)
Chloride: 101 mmol/L (ref 98–110)
GFR, Est African American: 91 mL/min/{1.73_m2} (ref >=60)
GFR, Est Non African American: 79 mL/min/{1.73_m2} (ref >=60)
GLOBULIN: 2.4 g/dL (ref 1.9–3.7)
Glucose, Bld: 104 mg/dL — ABNORMAL HIGH (ref 65–99)
Potassium: 4.3 mmol/L (ref 3.5–5.3)
SODIUM: 139 mmol/L (ref 135–146)
Total Bilirubin: 0.3 mg/dL (ref 0.2–1.2)
Total Protein: 7 g/dL (ref 6.1–8.1)

## 2017-12-24 LAB — HEMOGLOBIN A1C
EAG (MMOL/L): 6.8 (calc)
Hgb A1c MFr Bld: 5.9 % of total Hgb — ABNORMAL HIGH (ref <5.7)
Mean Plasma Glucose: 123 (calc)

## 2017-12-24 LAB — MAGNESIUM: MAGNESIUM: 2 mg/dL (ref 1.5–2.5)

## 2017-12-24 LAB — INSULIN, RANDOM: Insulin: 18 u[IU]/mL (ref 2.0–19.6)

## 2017-12-24 LAB — TSH: TSH: 0.36 mIU/L — ABNORMAL LOW (ref 0.40–4.50)

## 2017-12-24 LAB — VITAMIN D 25 HYDROXY (VIT D DEFICIENCY, FRACTURES): Vit D, 25-Hydroxy: 59 ng/mL (ref 30–100)

## 2017-12-24 LAB — PSA: PSA: 1.6 ng/mL (ref <=4.0)

## 2018-01-17 ENCOUNTER — Other Ambulatory Visit: Payer: Self-pay | Admitting: Internal Medicine

## 2018-01-17 DIAGNOSIS — J449 Chronic obstructive pulmonary disease, unspecified: Secondary | ICD-10-CM

## 2018-01-17 MED ORDER — PREDNISONE 10 MG PO TABS: ORAL_TABLET | ORAL | 0 refills | Status: DC

## 2018-01-20 ENCOUNTER — Ambulatory Visit: Payer: Self-pay | Admitting: Internal Medicine

## 2018-01-26 ENCOUNTER — Other Ambulatory Visit: Payer: Self-pay | Admitting: Internal Medicine

## 2018-01-29 ENCOUNTER — Other Ambulatory Visit: Payer: Self-pay | Admitting: *Deleted

## 2018-01-29 MED ORDER — ALBUTEROL SULFATE HFA 108 (90 BASE) MCG/ACT IN AERS: INHALATION_SPRAY | RESPIRATORY_TRACT | 3 refills | Status: DC

## 2018-02-25 ENCOUNTER — Telehealth: Payer: Self-pay

## 2018-02-25 NOTE — Telephone Encounter (Signed)
Baclofen refill request. Not on current medication list. Please advise.

## 2018-02-26 ENCOUNTER — Other Ambulatory Visit: Payer: Self-pay | Admitting: Adult Health

## 2018-02-26 MED ORDER — BACLOFEN 10 MG PO TABS: ORAL_TABLET | ORAL | 1 refills | Status: DC

## 2018-02-27 ENCOUNTER — Other Ambulatory Visit: Payer: Self-pay | Admitting: Internal Medicine

## 2018-02-27 MED ORDER — BACLOFEN 10 MG PO TABS: ORAL_TABLET | ORAL | 0 refills | Status: DC

## 2018-03-24 NOTE — Progress Notes (Signed)
MEDICARE ANNUAL WELLNESS VISIT AND FOLLOW UP Assessment:  : Encounter for Medicare annual wellness exam 1 year  Essential hypertension -     CBC with Differential/Platelet -     CMP/GFR -     TSH - continue medications, DASH diet, exercise and monitor at home. Call if greater than 130/80.   Abnormal glucose -     Hemoglobin A1c Discussed general issues about diabetes pathophysiology and management., Educational material distributed., Suggested low cholesterol diet., Encouraged aerobic exercise., Discussed foot care., Reminded to get yearly retinal exam.  Mixed hyperlipidemia -     Lipid panel -continue medications, check lipids, decrease fatty foods, increase activity.   Medication management -     Magnesium  Tobacco use disorder Continue annual CXR, not candidate for low dose CT at this time Discussed risks associated with tobacco use and advised to reduce or quit Declines Chantix or other medication Will follow up at the next visit  Chronic obstructive pulmonary disease, unspecified COPD type ([place]) Advised to stop smoking, will get CXR, continue meds. Denies symptoms.   Gastroesophageal reflux disease, esophagitis presence not specified Well managed by lifestyle and PRN tums at this time Discussed diet, avoiding triggers and other lifestyle changes  Vitamin D deficiency At goal at recent check; continue to recommend supplementation for goal of 70-100 Defer vitamin D level  Atherosclerosis of aorta (HCC) Control blood pressure, cholesterol, glucose, increase exercise.  Stop smoking -     Lipid panel -     Hemoglobin A1c   Over 30 minutes of exam, counseling, chart review, and critical decision making was performed Future Appointments  Date Time Provider [place]  10/13/2018  3:00 PM Unk Pinto, MD [facility] None    Plan:   During the course of the visit the patient was educated and counseled about appropriate screening and preventive services  including:    Pneumococcal vaccine   Influenza vaccine  Prevnar 13  Td vaccine  Screening electrocardiogram  Colorectal cancer screening  Diabetes screening  Glaucoma screening  Nutrition counseling    Subjective:  Jesus Bush is a 72 y.o. male who presents for Medicare Annual Wellness Visit and 3 month follow up for HTN, hyperlipidemia, prediabetes, and vitamin D Def.   BMI is Body mass index is 22.71 kg/m., he has been working on diet and exercise. Wt Readings from Last 3 Encounters:  03/25/18 156 lb (70.8 kg)  12/17/17 149 lb 6.4 oz (67.8 kg)  10/08/17 154 lb (69.9 kg)   His blood pressure has been controlled at home, today their BP is BP: 102/62 He does not workout. He denies chest pain, shortness of breath, dizziness.   He has COPD, is a smoker, interested in quitting, tried wellbutrin and chantix didn't help, smokes 3-4 cigs a day, plans to continue to taper.   he has a diagnosis of GERD which is currently managed by lifestyle modification and rare tums.  he reports symptoms is currently well controlled, and denies breakthrough reflux, burning in chest, hoarseness or cough.    He is on cholesterol medication (atorvastatin 20 mg daily) and denies myalgias. His cholesterol is at goal. The cholesterol last visit was:   Lab Results  Component Value Date   CHOL 182 12/17/2017   HDL 36 (L) 12/17/2017   LDLCALC 112 (H) 12/17/2017   TRIG 220 (H) 12/17/2017   CHOLHDL 5.1 (H) 12/17/2017   He has been working on diet and exercise for prediabetes , and denies foot ulcerations, hyperglycemia, hypoglycemia ,  increased appetite, nausea, paresthesia of the feet, polydipsia, polyuria, visual disturbances, vomiting and weight loss. Last A1C in the office was:  Lab Results  Component Value Date   HGBA1C 5.7 (H) 12/17/2017   Last GFR Lab Results  Component Value Date   GFRNONAA 73 12/17/2017    Patient is on Vitamin D supplement.   Lab Results  Component Value Date    VD25OH 56 12/17/2017       Medication Review: Current Outpatient Medications on File Prior to Visit  Medication Sig Dispense Refill  . ACCU-CHEK SOFTCLIX LANCETS lancets Check blood sugar 1 time daily-DX-R73.09 100 each 3  . albuterol (VENTOLIN HFA) 108 (90 Base) MCG/ACT inhaler 2 inhalations 15 minutes apart every 4 hours to Rescue Asthma 48 g 3  . Alcohol Swabs (B-D SINGLE USE SWABS REGULAR) PADS Check blood sugar 1 time daily-DX-R73.09. 100 each 3  . aspirin 81 MG tablet Take 81 mg by mouth daily.    Marland Kitchen atorvastatin (LIPITOR) 20 MG tablet Take 1 tablet daily  for Cholesterol 90 tablet 3  . baclofen (LIORESAL) 10 MG tablet Take 1/2 to 1 tablet 2 x /day ONLY if needed for Muscle Spasm 180 tablet 0  . Blood Glucose Calibration (ACCU-CHEK AVIVA) SOLN Use per box instruction. 1 each 3  . Blood Glucose Monitoring Suppl (ACCU-CHEK AVIVA PLUS) w/Device KIT Check blood sugar 1 time daily-DX-R73.09 1 kit 0  . Cholecalciferol (VITAMIN D) 2000 UNITS CAPS Take 1 capsule by mouth daily.    Marland Kitchen DILT-XR 240 MG 24 hr capsule TAKE 1 CAPSULE DAILY FOR BLOOD PRESSURE AND HEART BEAT 90 capsule 1  . finasteride (PROSCAR) 5 MG tablet TAKE 1 TABLET DAILY FOR PROSTATE 90 tablet 3  . glucose blood (ACCU-CHEK AVIVA PLUS) test strip Check blood sugar 1 time daily-R73.09. 100 each 3  . losartan (COZAAR) 100 MG tablet Take 1 tablet daily for BP 90 tablet 1  . magnesium gluconate (MAGONATE) 500 MG tablet Take 500 mg by mouth daily.     . montelukast (SINGULAIR) 10 MG tablet Take 1 tablet (10 mg total) by mouth daily. 90 tablet 3  . ondansetron (ZOFRAN ODT) 8 MG disintegrating tablet Dissolve 1 tablet under tongue every 6 to 8 hours if needed for [place] 20 tablet 0  . ranitidine (ZANTAC) 300 MG tablet Take 1 to tablet daily for heartburn & reflux 90 tablet 1   No current facility-administered medications on file prior to visit.     Current Problems (verified) Patient Active Problem List   Diagnosis Date Noted  .  Aortic atherosclerosis ([place]) 09/09/2017  . COPD (chronic obstructive pulmonary disease) ([place]) 11/08/2016  . Esophageal reflux 04/25/2015  . Tobacco use disorder 07/23/2014  . Medication management 04/13/2014  . Essential hypertension 02/11/2013  . Mixed hyperlipidemia 02/11/2013  . Vitamin D deficiency 02/11/2013  . Abnormal glucose 05/12/2012    Screening Tests Immunization History  Administered Date(s) Administered  . DTaP 05/23/2006  . Influenza Whole 12/16/2012  . Influenza, High Dose Seasonal PF 01/17/2015, 12/13/2015, 01/29/2017, 12/17/2017  . Influenza-Unspecified 12/27/2013  . Pneumococcal Conjugate-13 04/13/2014  . Pneumococcal Polysaccharide-23 09/13/2008, 08/06/2016  . Td 08/06/2016  . Zoster 04/17/2013    Preventative care: Last colonoscopy: 2009  Cologuard: 08/2017 neg CXR 2019 he does smoker very little  Prior vaccinations: TD or Tdap: 2018 Influenza: 2019 Pneumococcal: 2010 Prevnar13: 2016 Shingles/Zostavax: 2015  Names of Other Physician/Practitioners you currently use: 1. [facility] here for primary care 2. Dr. Einar Gip, eye  doctor, last visit 2019 3. Does not see, dentures, dentist  Patient Care Team: Unk Pinto, MD as PCP - General (Internal Medicine) Unk Pinto, MD (Internal Medicine) Inda Castle, MD (Inactive) as Consulting Physician (Gastroenterology)  Allergies Allergies  Allergen Reactions  . Ppd [Tuberculin Purified Protein Derivative]     Positive PPD.    SURGICAL HISTORY He  has no past surgical history on file. FAMILY HISTORY His family history includes Diabetes in his sister; Heart attack in his brother, father, and mother; Heart disease in his father, mother, and sister; Hypertension in his brother. SOCIAL HISTORY He  reports that he has been smoking cigarettes. He has a 25.00 pack-year smoking history. He has never used smokeless tobacco. He reports that he does not drink  alcohol or use drugs.  MEDICARE WELLNESS OBJECTIVES: Physical activity: Current Exercise Habits: Home exercise routine, Type of exercise: walking, Time (Minutes): 45, Frequency (Times/Week): 5, Weekly Exercise (Minutes/Week): 225, Intensity: Mild, Exercise limited by: None identified Cardiac risk factors: Cardiac Risk Factors include: male gender;hypertension;dyslipidemia;smoking/ tobacco exposure;advanced age (>42mn, >>75women) Depression/mood screen:   [facility] 03/25/2018  Decreased Interest 0  Down, Depressed, Hopeless 0  PHQ - 2 Score 0    ADLs:  In your present state of health, do you have any difficulty performing the following activities: 03/25/2018 12/21/2017  Hearing? N N  Vision? N N  Difficulty concentrating or making decisions? N N  Walking or climbing stairs? N N  Dressing or bathing? N N  Doing errands, shopping? N N  Some recent data might be hidden     Cognitive Testing  Alert? Yes  Normal Appearance?Yes  Oriented to person? Yes  Place? Yes   Time? Yes  Recall of three objects?  Yes  Can perform simple calculations? Yes  Displays appropriate judgment?Yes  Can read the correct time from a watch face?Yes  EOL planning: Does Patient Have a Medical Advance Directive?: Yes Type of Advance Directive: Healthcare Power of Attorney, Living will Does patient want to make changes to medical advance directive?: No - Patient declined Copy of [place] Chart?: No - copy requested   Objective:   Today's Vitals   03/25/18 1102  BP: 102/62  Pulse: 85  Temp: (!) 97.5 F (36.4 C)  SpO2: 97%  Weight: 156 lb (70.8 kg)  Height: 5' 9.5" (1.765 m)   Body mass index is 22.71 kg/m.  General appearance: Thin and ill appearing, alert, no distress, WD/WN, male HEENT: normocephalic, sclerae anicteric, TMs pearly, nares patent, no discharge or erythema, pharynx normal Oral cavity: MMM, no lesions Neck: supple, no lymphadenopathy, no  thyromegaly, no masses Heart: RRR, normal S1, S2, no murmurs Lungs: CTA bilaterally, no rhonchi, or rales  Wheezes throughout Abdomen: +bs, soft, non tender, non distended, no masses, no hepatomegaly, no splenomegaly Musculoskeletal: nontender, no swelling, no obvious deformity Extremities: no edema, no cyanosis, no clubbing Pulses: 2+ symmetric, upper and lower extremities, normal cap refill Neurological: alert, oriented x 3, CN2-12 intact, strength normal upper extremities and lower extremities, sensation normal throughout, DTRs 2+ throughout, no cerebellar signs, gait normal Psychiatric: normal affect, behavior normal, pleasant   Medicare Attestation I have personally reviewed: The patient's medical and social history Their use of alcohol, tobacco or illicit drugs Their current medications and supplements The patient's functional ability including ADLs,fall risks, home safety risks, cognitive, and hearing and visual impairment Diet and physical activities Evidence for depression or mood disorders  The patient's weight, height,  BMI, and visual acuity have been recorded in the chart.  I have made referrals, counseling, and provided education to the patient based on review of the above and I have provided the patient with a written personalized care plan for preventive services.     Izora Ribas, NP   03/25/2018

## 2018-03-25 ENCOUNTER — Ambulatory Visit: Payer: Self-pay | Admitting: Internal Medicine

## 2018-03-25 ENCOUNTER — Ambulatory Visit (INDEPENDENT_AMBULATORY_CARE_PROVIDER_SITE_OTHER): Payer: Medicare HMO | Admitting: Adult Health

## 2018-03-25 ENCOUNTER — Encounter: Payer: Self-pay | Admitting: Adult Health

## 2018-03-25 VITALS — BP 102/62 | HR 85 | Temp 97.5°F | Ht 69.5 in | Wt 156.0 lb

## 2018-03-25 DIAGNOSIS — Z0001 Encounter for general adult medical examination with abnormal findings: Secondary | ICD-10-CM | POA: Diagnosis not present

## 2018-03-25 DIAGNOSIS — I10 Essential (primary) hypertension: Secondary | ICD-10-CM | POA: Diagnosis not present

## 2018-03-25 DIAGNOSIS — E782 Mixed hyperlipidemia: Secondary | ICD-10-CM

## 2018-03-25 DIAGNOSIS — Z0000 Encounter for general adult medical examination without abnormal findings: Secondary | ICD-10-CM

## 2018-03-25 DIAGNOSIS — R7309 Other abnormal glucose: Secondary | ICD-10-CM

## 2018-03-25 DIAGNOSIS — R6889 Other general symptoms and signs: Secondary | ICD-10-CM

## 2018-03-25 DIAGNOSIS — I700 Atherosclerosis of aorta: Secondary | ICD-10-CM | POA: Diagnosis not present

## 2018-03-25 DIAGNOSIS — E559 Vitamin D deficiency, unspecified: Secondary | ICD-10-CM

## 2018-03-25 DIAGNOSIS — J449 Chronic obstructive pulmonary disease, unspecified: Secondary | ICD-10-CM

## 2018-03-25 DIAGNOSIS — Z79899 Other long term (current) drug therapy: Secondary | ICD-10-CM

## 2018-03-25 DIAGNOSIS — Z6821 Body mass index (BMI) 21.0-21.9, adult: Secondary | ICD-10-CM

## 2018-03-25 DIAGNOSIS — K219 Gastro-esophageal reflux disease without esophagitis: Secondary | ICD-10-CM

## 2018-03-25 DIAGNOSIS — F17200 Nicotine dependence, unspecified, uncomplicated: Secondary | ICD-10-CM | POA: Diagnosis not present

## 2018-03-25 MED ORDER — ATORVASTATIN CALCIUM 40 MG PO TABS: ORAL_TABLET | ORAL | 1 refills | Status: DC

## 2018-03-25 NOTE — Patient Instructions (Addendum)
Jesus Bush , Thank you for taking time to come for your Medicare Wellness Visit. I appreciate your ongoing commitment to your health goals. Please review the following plan we discussed and let me know if I can assist you in the future.   These are the goals we discussed: Goals    . Quit Smoking       This is a list of the screening recommended for you and due dates:  Health Maintenance  Topic Date Due  .  Hepatitis C: One time screening is recommended by Center for Disease Control  (CDC) for  adults born from [birth year] through 1966.   01/25/2020*  . Cologuard (Stool DNA test)  09/18/2020  . Tetanus Vaccine  08/07/2026  . Flu Shot  Completed  . Pneumonia vaccines  Completed  *Topic was postponed. The date shown is not the original due date.    Know what a healthy weight is for you (roughly BMI <25) and aim to maintain this  Aim for 7+ servings of fruits and vegetables daily  65-80+ fluid ounces of water or unsweet tea for healthy kidneys  Limit to max 1 drink of alcohol per day; avoid smoking/tobacco  Limit animal fats in diet for cholesterol and heart health - choose grass fed whenever available  Avoid highly processed foods, and foods high in saturated/trans fats  Aim for low stress - take time to unwind and care for your mental health  Aim for 150 min of moderate intensity exercise weekly for heart health, and weights twice weekly for bone health  Aim for 7-9 hours of sleep daily     Steps to Quit Smoking  Smoking tobacco can be harmful to your health and can affect almost every organ in your body. Smoking puts you, and those around you, at risk for developing many serious chronic diseases. Quitting smoking is difficult, but it is one of the best things that you can do for your health. It is never too late to quit. What are the benefits of quitting smoking? When you quit smoking, you lower your risk of developing serious diseases and conditions, such as:  Lung cancer or  lung disease, such as COPD.  Heart disease.  Stroke.  Heart attack.  Infertility.  Osteoporosis and bone fractures. Additionally, symptoms such as coughing, wheezing, and shortness of breath may get better when you quit. You may also find that you get sick less often because your body is stronger at fighting off colds and infections. If you are pregnant, quitting smoking can help to reduce your chances of having a baby of low birth weight. How do I get ready to quit? When you decide to quit smoking, create a plan to make sure that you are successful. Before you quit:  Pick a date to quit. Set a date within the next two weeks to give you time to prepare.  Write down the reasons why you are quitting. Keep this list in places where you will see it often, such as on your bathroom mirror or in your car or wallet.  Identify the people, places, things, and activities that make you want to smoke (triggers) and avoid them. Make sure to take these actions: ? Throw away all cigarettes at home, at work, and in your car. ? Throw away smoking accessories, such as Scientist, research (medical). ? Clean your car and make sure to empty the ashtray. ? Clean your home, including curtains and carpets.  Tell your family, friends, and coworkers that  you are quitting. Support from your loved ones can make quitting easier.  Talk with your health care provider about your options for quitting smoking.  Find out what treatment options are covered by your health insurance. What strategies can I use to quit smoking? Talk with your healthcare provider about different strategies to quit smoking. Some strategies include:  Quitting smoking altogether instead of gradually lessening how much you smoke over a period of time. Research shows that quitting "cold [place]" is more successful than gradually quitting.  Attending in-person counseling to help you build problem-solving skills. You are more likely to have success in  quitting if you attend several counseling sessions. Even short sessions of 10 minutes can be effective.  Finding resources and support systems that can help you to quit smoking and remain smoke-free after you quit. These resources are most helpful when you use them often. They can include: ? Online chats with a Social worker. ? Telephone quitlines. ? Careers information officer. ? Support groups or group counseling. ? Text messaging programs. ? Mobile phone applications.  Taking medicines to help you quit smoking. (If you are pregnant or breastfeeding, talk with your health care provider first.) Some medicines contain nicotine and some do not. Both types of medicines help with cravings, but the medicines that include nicotine help to relieve withdrawal symptoms. Your health care provider may recommend: ? Nicotine patches, gum, or lozenges. ? Nicotine inhalers or sprays. ? Non-nicotine medicine that is taken by mouth. Talk with your health care provider about combining strategies, such as taking medicines while you are also receiving in-person counseling. Using these two strategies together makes you more likely to succeed in quitting than if you used either strategy on its own. If you are pregnant or breastfeeding, talk with your health care provider about finding counseling or other support strategies to quit smoking. Do not take medicine to help you quit smoking unless told to do so by your health care provider. What things can I do to make it easier to quit? Quitting smoking might feel overwhelming at first, but there is a lot that you can do to make it easier. Take these important actions:  Reach out to your family and friends and ask that they support and encourage you during this time. Call telephone quitlines, reach out to support groups, or work with a counselor for support.  Ask people who smoke to avoid smoking around you.  Avoid places that trigger you to smoke, such as bars, parties,  or smoke-break areas at work.  Spend time around people who do not smoke.  Lessen stress in your life, because stress can be a smoking trigger for some people. To lessen stress, try: ? Exercising regularly. ? Deep-breathing exercises. ? Yoga. ? Meditating. ? Performing a body scan. This involves closing your eyes, scanning your body from head to toe, and noticing which parts of your body are particularly tense. Purposefully relax the muscles in those areas.  Download or purchase mobile phone or tablet apps (applications) that can help you stick to your quit plan by providing reminders, tips, and encouragement. There are many free apps, such as QuitGuide from the [employer] Office manager for Disease Control and Prevention). You can find other support for quitting smoking (smoking cessation) through smokefree.gov and other websites. How will I feel when I quit smoking? Within the first 24 hours of quitting smoking, you may start to feel some withdrawal symptoms. These symptoms are usually most noticeable 2-3 days after quitting, but they  usually do not last beyond 2-3 weeks. Changes or symptoms that you might experience include:  Mood swings.  Restlessness, anxiety, or irritation.  Difficulty concentrating.  Dizziness.  Strong cravings for sugary foods in addition to nicotine.  Mild weight gain.  Constipation.  Nausea.  Coughing or a sore throat.  Changes in how your medicines work in your body.  A depressed mood.  Difficulty sleeping (insomnia). After the first 2-3 weeks of quitting, you may start to notice more positive results, such as:  Improved sense of smell and taste.  Decreased coughing and sore throat.  Slower heart rate.  Lower blood pressure.  Clearer skin.  The ability to breathe more easily.  Fewer sick days. Quitting smoking is very challenging for most people. Do not get discouraged if you are not successful the first time. Some people need to make many attempts  to quit before they achieve long-term success. Do your best to stick to your quit plan, and talk with your health care provider if you have any questions or concerns. This information is not intended to replace advice given to you by your health care provider. Make sure you discuss any questions you have with your health care provider. Document Released: 03/06/2001 Document Revised: 10/16/2016 Document Reviewed: 07/27/2014 Elsevier Interactive Patient Education  2019 [employer].

## 2018-03-26 LAB — LIPID PANEL
Cholesterol: 180 mg/dL (ref <200)
HDL: 37 mg/dL — ABNORMAL LOW (ref >40)
LDL Cholesterol (Calc): 109 mg/dL (calc) — ABNORMAL HIGH
Non-HDL Cholesterol (Calc): 143 mg/dL (calc) — ABNORMAL HIGH (ref <130)
Total CHOL/HDL Ratio: 4.9 (calc) (ref <5.0)
Triglycerides: 220 mg/dL — ABNORMAL HIGH (ref <150)

## 2018-03-26 LAB — COMPLETE METABOLIC PANEL WITH GFR
AG Ratio: 1.8 (calc) (ref 1.0–2.5)
ALT: 12 U/L (ref 9–46)
AST: 12 U/L (ref 10–35)
Albumin: 4.2 g/dL (ref 3.6–5.1)
Alkaline phosphatase (APISO): 96 U/L (ref 40–115)
BUN: 15 mg/dL (ref 7–25)
CO2: 29 mmol/L (ref 20–32)
Calcium: 9.8 mg/dL (ref 8.6–10.3)
Chloride: 105 mmol/L (ref 98–110)
Creat: 1.06 mg/dL (ref 0.70–1.18)
GFR, Est African American: 80 mL/min/{1.73_m2} (ref >=60)
GFR, Est Non African American: 69 mL/min/{1.73_m2} (ref >=60)
GLUCOSE: 102 mg/dL — AB (ref 65–99)
Globulin: 2.3 g/dL (calc) (ref 1.9–3.7)
Potassium: 4.4 mmol/L (ref 3.5–5.3)
Sodium: 139 mmol/L (ref 135–146)
Total Bilirubin: 0.4 mg/dL (ref 0.2–1.2)
Total Protein: 6.5 g/dL (ref 6.1–8.1)

## 2018-03-26 LAB — CBC WITH DIFFERENTIAL/PLATELET
Absolute Monocytes: 559 cells/uL (ref 200–950)
BASOS PCT: 1.1 %
Basophils Absolute: 72 cells/uL (ref 0–200)
EOS PCT: 2.5 %
Eosinophils Absolute: 163 cells/uL (ref 15–500)
HCT: 43.7 % (ref 38.5–50.0)
Hemoglobin: 14.8 g/dL (ref 13.2–17.1)
LYMPHS ABS: 1463 {cells}/uL (ref 850–3900)
MCH: 32.4 pg (ref 27.0–33.0)
MCHC: 33.9 g/dL (ref 32.0–36.0)
MCV: 95.6 fL (ref 80.0–100.0)
MONOS PCT: 8.6 %
MPV: 10.5 fL (ref 7.5–12.5)
NEUTROS ABS: 4245 {cells}/uL (ref 1500–7800)
NEUTROS PCT: 65.3 %
Platelets: 332 10*3/uL (ref 140–400)
RBC: 4.57 10*6/uL (ref 4.20–5.80)
RDW: 12.6 % (ref 11.0–15.0)
TOTAL LYMPHOCYTE: 22.5 %
WBC: 6.5 10*3/uL (ref 3.8–10.8)

## 2018-03-26 LAB — HEMOGLOBIN A1C
Hgb A1c MFr Bld: 6 % of total Hgb — ABNORMAL HIGH (ref <5.7)
Mean Plasma Glucose: 126 (calc)
eAG (mmol/L): 7 (calc)

## 2018-03-26 LAB — MAGNESIUM: MAGNESIUM: 2.1 mg/dL (ref 1.5–2.5)

## 2018-03-26 LAB — TSH: TSH: 0.33 mIU/L — ABNORMAL LOW (ref 0.40–4.50)

## 2018-04-29 ENCOUNTER — Other Ambulatory Visit: Payer: Self-pay | Admitting: Adult Health

## 2018-05-06 ENCOUNTER — Other Ambulatory Visit: Payer: Self-pay | Admitting: Internal Medicine

## 2018-05-16 ENCOUNTER — Encounter: Payer: Self-pay | Admitting: Physician Assistant

## 2018-05-16 ENCOUNTER — Ambulatory Visit (INDEPENDENT_AMBULATORY_CARE_PROVIDER_SITE_OTHER): Payer: Medicare HMO | Admitting: Physician Assistant

## 2018-05-16 VITALS — BP 118/62 | HR 74 | Temp 97.4°F | Ht 69.5 in | Wt 153.6 lb

## 2018-05-16 DIAGNOSIS — M25512 Pain in left shoulder: Secondary | ICD-10-CM

## 2018-05-16 DIAGNOSIS — G8929 Other chronic pain: Secondary | ICD-10-CM

## 2018-05-16 MED ORDER — DEXAMETHASONE SODIUM PHOSPHATE 100 MG/10ML IJ SOLN
10.0000 mg | Freq: Once | INTRAMUSCULAR | Status: AC
  Administered 2018-05-16: 10 mg via INTRAMUSCULAR

## 2018-05-16 MED ORDER — PREDNISONE 20 MG PO TABS: ORAL_TABLET | ORAL | 0 refills | Status: AC

## 2018-05-16 NOTE — Patient Instructions (Signed)
INFORMATION ABOUT YOUR XRAY  Can walk into [address] for an Insurance account manager.  They will have the order and take you back. You do not any paper work, I should get the result back today or tomorrow. This order is good for a year.    Try the exercises and other information below, meloxicam once during the day as needed (avoid taking other NSAIDS like Alleve or Ibuprofen while taking this) and then flexeril if needed at bedtime for muscle spasm. This can be taken up to every 8 hours, but causes sedation, so should not drive or operate heavy machinery while taking this medicine.   Go to the ER if you have any new weakness in your arms, trouble with your grip, worse headache ever, fever, chills. or have worsening pain.   If you are not better in 1-3 month we will refer you to ortho   Cervical Sprain A cervical sprain is a stretch or tear in one or more of the tough, cord-like tissues that connect bones (ligaments) in the neck. Cervical sprains can range from mild to severe. Severe cervical sprains can cause the spinal bones (vertebrae) in the neck to be unstable. This can lead to spinal cord damage and can result in serious nervous system problems. The amount of time that it takes for a cervical sprain to get better depends on the cause and extent of the injury. Most cervical sprains heal in 4-6 weeks. What are the causes? Cervical sprains may be caused by an injury (trauma), such as from a motor vehicle accident, a fall, or sudden forward and backward whipping movement of the head and neck (whiplash injury). Mild cervical sprains may be caused by wear and tear over time, such as from poor posture, sitting in a chair that does not provide support, or looking up or down for long periods of time. What increases the risk? The following factors may make you more likely to develop this condition:  Participating in activities that have a high risk of trauma to the neck. These include contact sports,  auto racing, gymnastics, and diving.  Taking risks when driving or riding in a motor vehicle, such as speeding.  Having osteoarthritis of the spine.  Having poor strength and flexibility of the neck.  A previous neck injury.  Having poor posture.  Spending a lot of time in certain positions that put stress on the neck, such as sitting at a computer for long periods of time.  What are the signs or symptoms? Symptoms of this condition include:  Pain, soreness, stiffness, tenderness, swelling, or a burning sensation in the front, back, or sides of the neck.  Sudden tightening of neck muscles that you cannot control (muscle spasms).  Pain in the shoulders or upper back.  Limited ability to move the neck.  Headache.  Dizziness.  Nausea.  Vomiting.  Weakness, numbness, or tingling in a hand or an arm.  Symptoms may develop right away after injury, or they may develop over a few days. In some cases, symptoms may go away with treatment and return (recur) over time. How is this diagnosed? This condition may be diagnosed based on:  Your medical history.  Your symptoms.  Any recent injuries or known neck problems that you have, such as arthritis in the neck.  A physical exam.  Imaging tests, such as: ? X-rays. ? MRI. ? CT scan.  How is this treated? This condition is treated by resting and icing the injured area and  doing physical therapy exercises. Depending on the severity of your condition, treatment may also include:  Keeping your neck in place (immobilized) for periods of time. This may be done using: ? A cervical collar. This supports your chin and the back of your head. ? A cervical traction device. This is a sling that holds up your head. This removes weight and pressure from your neck, and it may help to relieve pain.  Medicines that help to relieve pain and inflammation.  Medicines that help to relax your muscles (muscle relaxants).  Surgery. This is  rare.  Follow these instructions at home: If you have a cervical collar:  Wear it as told by your health care provider. Do not remove the collar unless instructed by your health care provider.  Ask your health care provider before you make any adjustments to your collar.  If you have long hair, keep it outside of the collar.  Ask your health care provider if you can remove the collar for cleaning and bathing. If you are allowed to remove the collar for cleaning or bathing: ? Follow instructions from your health care provider about how to remove the collar safely. ? Clean the collar by wiping it with mild soap and water and drying it completely. ? If your collar has removable pads, remove them every 1-2 days and wash them by hand with soap and water. Let them air-dry completely before you put them back in the collar. ? Check your skin under the collar for irritation or sores. If you see any, tell your health care provider. Managing pain, stiffness, and swelling  If directed, use a cervical traction device as told by your health care provider.  If directed, apply heat to the affected area before you do your physical therapy or as often as told by your health care provider. Use the heat source that your health care provider recommends, such as a moist heat pack or a heating pad. ? Place a towel between your skin and the heat source. ? Leave the heat on for 20-30 minutes. ? Remove the heat if your skin turns bright red. This is especially important if you are unable to feel pain, heat, or cold. You may have a greater risk of getting burned.  If directed, put ice on the affected area: ? Put ice in a plastic bag. ? Place a towel between your skin and the bag. ? Leave the ice on for 20 minutes, 2-3 times a day. Activity  Do not drive while wearing a cervical collar. If you do not have a cervical collar, ask your health care provider if it is safe to drive while your neck heals.  Do not  drive or use heavy machinery while taking prescription pain medicine or muscle relaxants, unless your health care provider approves.  Do not lift anything that is heavier than 10 lb (4.5 kg) until your health care provider tells you that it is safe.  Rest as directed by your health care provider. Avoid positions and activities that make your symptoms worse. Ask your health care provider what activities are safe for you.  If physical therapy was prescribed, do exercises as told by your health care provider or physical therapist. General instructions  Take over-the-counter and prescription medicines only as told by your health care provider.  Do not use any products that contain nicotine or tobacco, such as cigarettes and e-cigarettes. These can delay healing. If you need help quitting, ask your health care provider.  Keep all follow-up visits as told by your health care provider or physical therapist. This is important. How is this prevented? To prevent a cervical sprain from happening again:  Use and maintain good posture. Make any needed adjustments to your workstation to help you use good posture.  Exercise regularly as directed by your health care provider or physical therapist.  Avoid risky activities that may cause a cervical sprain.  Contact a health care provider if:  You have symptoms that get worse or do not get better after 2 weeks of treatment.  You have pain that gets worse or does not get better with medicine.  You develop new, unexplained symptoms.  You have sores or irritated skin on your neck from wearing your cervical collar. Get help right away if:  You have severe pain.  You develop numbness, tingling, or weakness in any part of your body.  You cannot move a part of your body (you have paralysis).  You have neck pain along with: ? Severe dizziness. ? Headache. Summary  A cervical sprain is a stretch or tear in one or more of the tough, cord-like tissues  that connect bones (ligaments) in the neck.  Cervical sprains may be caused by an injury (trauma), such as from a motor vehicle accident, a fall, or sudden forward and backward whipping movement of the head and neck (whiplash injury).  Symptoms may develop right away after injury, or they may develop over a few days.  This condition is treated by resting and icing the injured area and doing physical therapy exercises. This information is not intended to replace advice given to you by your health care provider. Make sure you discuss any questions you have with your health care provider. Document Released: 01/07/2007 Document Revised: 11/09/2015 Document Reviewed: 11/09/2015 Elsevier Interactive Patient Education  2017 [employer]   Cervical Strain and Sprain Rehab Ask your health care provider which exercises are safe for you. Do exercises exactly as told by your health care provider and adjust them as directed. It is normal to feel mild stretching, pulling, tightness, or discomfort as you do these exercises, but you should stop right away if you feel sudden pain or your pain gets worse.Do not begin these exercises until told by your health care provider. Stretching and range of motion exercises These exercises warm up your muscles and joints and improve the movement and flexibility of your neck. These exercises also help to relieve pain, numbness, and tingling. Exercise A: Cervical side bend  1. Using good posture, sit on a stable chair or stand up. 2. Without moving your shoulders, slowly tilt your left / right ear to your shoulder until you feel a stretch in your neck muscles. You should be looking straight ahead. 3. Hold for __________ seconds. 4. Repeat with the other side of your neck. Repeat __________ times. Complete this exercise __________ times a day. Exercise B: Cervical rotation  1. Using good posture, sit on a stable chair or stand up. 2. Slowly turn your head to the side  as if you are looking over your left / right shoulder. ? Keep your eyes level with the ground. ? Stop when you feel a stretch along the side and the back of your neck. 3. Hold for __________ seconds. 4. Repeat this by turning to your other side. Repeat __________ times. Complete this exercise __________ times a day. Exercise C: Thoracic extension and pectoral stretch 1. Roll a towel or a small blanket so it is  about 4 inches (10 cm) in diameter. 2. Lie down on your back on a firm surface. 3. Put the towel lengthwise, under your spine in the middle of your back. It should not be not under your shoulder blades. The towel should line up with your spine from your middle back to your lower back. 4. Put your hands behind your head and let your elbows fall out to your sides. 5. Hold for __________ seconds. Repeat __________ times. Complete this exercise __________ times a day. Strengthening exercises These exercises build strength and endurance in your neck. Endurance is the ability to use your muscles for a long time, even after your muscles get tired. Exercise D: Upper cervical flexion, isometric 1. Lie on your back with a thin pillow behind your head and a small rolled-up towel under your neck. 2. Gently tuck your chin toward your chest and nod your head down to look toward your feet. Do not lift your head off the pillow. 3. Hold for __________ seconds. 4. Release the tension slowly. Relax your neck muscles completely before you repeat this exercise. Repeat __________ times. Complete this exercise __________ times a day. Exercise E: Cervical extension, isometric  1. Stand about 6 inches (15 cm) away from a wall, with your back facing the wall. 2. Place a soft object, about 6-8 inches (15-20 cm) in diameter, between the back of your head and the wall. A soft object could be a small pillow, a ball, or a folded towel. 3. Gently tilt your head back and press into the soft object. Keep your jaw and  forehead relaxed. 4. Hold for __________ seconds. 5. Release the tension slowly. Relax your neck muscles completely before you repeat this exercise. Repeat __________ times. Complete this exercise __________ times a day. Posture and body mechanics  Body mechanics refers to the movements and positions of your body while you do your daily activities. Posture is part of body mechanics. Good posture and healthy body mechanics can help to relieve stress in your body's tissues and joints. Good posture means that your spine is in its natural S-curve position (your spine is neutral), your shoulders are pulled back slightly, and your head is not tipped forward. The following are general guidelines for applying improved posture and body mechanics to your everyday activities. Standing  When standing, keep your spine neutral and keep your feet about hip-width apart. Keep a slight bend in your knees. Your ears, shoulders, and hips should line up.  When you do a task in which you stand in one place for a long time, place one foot up on a stable object that is 2-4 inches (5-10 cm) high, such as a footstool. This helps keep your spine neutral. Sitting   When sitting, keep your spine neutral and your keep feet flat on the floor. Use a footrest, if necessary, and keep your thighs parallel to the floor. Avoid rounding your shoulders, and avoid tilting your head forward.  When working at a desk or a computer, keep your desk at a height where your hands are slightly lower than your elbows. Slide your chair under your desk so you are close enough to maintain good posture.  When working at a computer, place your monitor at a height where you are looking straight ahead and you do not have to tilt your head forward or downward to look at the screen. Resting When lying down and resting, avoid positions that are most painful for you. Try to support your neck  in a neutral position. You can use a contour pillow or a small  rolled-up towel. Your pillow should support your neck but not push on it. This information is not intended to replace advice given to you by your health care provider. Make sure you discuss any questions you have with your health care provider. Document Released: 03/12/2005 Document Revised: 11/17/2015 Document Reviewed: 02/16/2015 Elsevier Interactive Patient Education  [employer].

## 2018-05-16 NOTE — Progress Notes (Signed)
Subjective:    Patient ID: Jesus Bush, male    DOB: 11-01-1945, 73 y.o.   MRN: [id number]  HPI 73 y.o. left handed smoking WM presents with left shoulder pain. Has had injection before that helped about 6-12 months ago. States has posterior shoulder pain with tingling up into his neck, intermittent. No injury. Has full ROM without pain. He has neck pain, limited rotation to the left due to pain.   Blood pressure 118/62, pulse 74, temperature (!) 97.4 F (36.3 C), height 5' 9.5" (1.765 m), weight 153 lb 9.6 oz (69.7 kg), SpO2 99 %.  Lab Results  Component Value Date   HGBA1C 6.0 (H) 03/25/2018    Medications Current Outpatient Medications on File Prior to Visit  Medication Sig  . ACCU-CHEK SOFTCLIX LANCETS lancets Check blood sugar 1 time daily-DX-R73.09  . albuterol (VENTOLIN HFA) 108 (90 Base) MCG/ACT inhaler 2 inhalations 15 minutes apart every 4 hours to Rescue Asthma  . Alcohol Swabs (B-D SINGLE USE SWABS REGULAR) PADS Check blood sugar 1 time daily-DX-R73.09.  Marland Kitchen aspirin 81 MG tablet Take 81 mg by mouth daily.  Marland Kitchen atorvastatin (LIPITOR) 40 MG tablet Take 1 tablet daily  for Cholesterol  . baclofen (LIORESAL) 10 MG tablet TAKE 1/2 TO 1 TABLET TWICE DAILY ONLY AS NEEDED FOR MUSCLE SPASM(S)  . Blood Glucose Calibration (ACCU-CHEK AVIVA) SOLN Use per box instruction.  . Blood Glucose Monitoring Suppl (ACCU-CHEK AVIVA PLUS) w/Device KIT Check blood sugar 1 time daily-DX-R73.09  . Cholecalciferol (VITAMIN D) 2000 UNITS CAPS Take 1 capsule by mouth daily.  Marland Kitchen DILT-XR 240 MG 24 hr capsule TAKE 1 CAPSULE DAILY FOR BLOOD PRESSURE AND HEART BEAT  . finasteride (PROSCAR) 5 MG tablet TAKE 1 TABLET DAILY FOR PROSTATE  . glucose blood (ACCU-CHEK AVIVA PLUS) test strip Check blood sugar 1 time daily-R73.09.  . losartan (COZAAR) 100 MG tablet Take 1 tablet daily for BP  . magnesium gluconate (MAGONATE) 500 MG tablet Take 500 mg by mouth daily.   . montelukast (SINGULAIR) 10 MG tablet Take 1  tablet (10 mg total) by mouth daily.  . ondansetron (ZOFRAN ODT) 8 MG disintegrating tablet Dissolve 1 tablet under tongue every 6 to 8 hours if needed for [place]  . pantoprazole (PROTONIX) 40 MG tablet TAKE 1 TABLET EVERY DAY  . ranitidine (ZANTAC) 300 MG tablet Take 1 to tablet daily for heartburn & reflux   No current facility-administered medications on file prior to visit.     Problem list He has Abnormal glucose; Essential hypertension; Mixed hyperlipidemia; Vitamin D deficiency; Medication management; Tobacco use disorder; Esophageal reflux; COPD (chronic obstructive pulmonary disease) ([place]); and Aortic atherosclerosis ([facility]) on their problem list.   Review of Systems  Constitutional: Negative.  Negative for chills, fatigue and fever.  HENT: Negative.   Eyes: Negative for photophobia, pain and visual disturbance.  Respiratory: Negative.  Negative for chest tightness and shortness of breath.   Cardiovascular: Negative.  Negative for chest pain.  Gastrointestinal: Negative.   Genitourinary: Negative.   Musculoskeletal: Positive for myalgias and neck pain. Negative for arthralgias, back pain, gait problem, joint swelling and neck stiffness.  Skin: Negative.  Negative for rash.  Neurological: Negative.  Negative for headaches.  Psychiatric/Behavioral: Negative.         Objective:   Physical Exam Constitutional:      General: He is not in acute distress.    Appearance: He is well-developed.  Neck:     Musculoskeletal: Normal range of motion and  neck supple.  Cardiovascular:     Rate and Rhythm: Normal rate and regular rhythm.     Heart sounds: No murmur.  Pulmonary:     Effort: Pulmonary effort is normal.     Breath sounds: Normal breath sounds. No wheezing.  Abdominal:     General: Bowel sounds are normal.     Palpations: Abdomen is soft.     Tenderness: There is no abdominal tenderness.  Musculoskeletal:     Comments: Left normal active ROM, no tenderness, no impingement  sign  Strength is normal and symmetric in arms. Neurovascularly intact distally. Good strength with stress of rotator cuff but causes pain. Positive impingement signs. Limited rotation to the left due to pain, without spinous process tenderness, with paraspinal muscle tenderness the left side and tender trap, normal sensation, reflexes, and pulses distal.   Lymphadenopathy:     Cervical: No cervical adenopathy.  Skin:    General: Skin is warm and dry.     Findings: No rash.  Neurological:     Mental Status: He is alert and oriented to person, place, and time.     Cranial Nerves: No cranial nerve deficit.         Assessment & Plan:   Chronic left shoulder pain Likely more referred from his neck Will get Xray Verbal consent obtained, risk and benefits were addressed with patient. A trigger point injection was performed at the site of maximal tenderness using 1% plain Lidocaine and Dexamethasone. This was well tolerated, and followed by immediate relief of pain. Return precautions discussed with the patient.  -     dexamethasone (DECADRON) injection 10 mg -     DG Cervical Spine Complete; Future -     predniSONE (DELTASONE) 20 MG tablet; 1 pill 3 x a day for 1 days, 1 pill 2 x a day x 3 days, 1 pill a day x 5 days with food

## 2018-05-22 ENCOUNTER — Telehealth: Payer: Self-pay

## 2018-05-22 NOTE — Telephone Encounter (Signed)
Pt transferred to front to schedule appt follow up from fall

## 2018-05-22 NOTE — Progress Notes (Signed)
Assessment and Plan:  Diagnoses and all orders for this visit:  Discussed increased risk of fractures related to history of COPD, tobacco use and thin stature.  Consider DEXA?  Muscle strain of right lower extremity, initial encounter -     methocarbamol (ROBAXIN-750) 750 MG tablet; Take 1 tablet (750 mg total) by mouth every 8 (eight) hours as needed for muscle spasms. Apply warm moist heat to the area, especially in the morning. Ibuprofen 630m every 6 hours OR 8067mevery 8 hours for next 4-5 days.  Essential hypertension Discussed monitoring blood pressure while taking antiinflammatory temporarily Stop if blood pressure elevates 140/90 or higher consistently.  Chronic obstructive pulmonary disease, unspecified COPD type (HCNew RoadsControlled at this time  Tobacco use disorder Smoking cessation discussed Not ready to quit at this time Will continue to assess readiness.   May contact office via phone [phone] My[facility]  Call or return with new or worsening symptoms as discussed in appointment.   Further disposition pending results of labs. Discussed med's effects and SE's.   Over 30 minutes of exam, counseling, chart review, and critical decision making was performed.   Future Appointments  Date Time Provider [place]4/04/2018 10:45 AM CoLiane ComberNP [facility] None  10/13/2018  3:00 PM McUnk PintoMD [facility] None  03/31/2019 11:15 AM CoLiane ComberNP [facility] None    ------------------------------------------------------------------------------------------------------------------   HPI [age].o.male presents for evaluation of his right leg.  This happened three days ago.  He was walking down the steps and he though he was at the bottom but there was one more step.  His leg went over a step and all of his weight landed on his right leg and he twisted.  He said the light had gone out in the building.  There was a hand rail that he was able to  grab on to and did not fall. For the past two days he had difficultly walking and he rested.  He did not do any interventions for this or take any OTC medications.  Reports this morning when he woke up his leg was painful 7/10.  After he got up and moved around it loosened up to 5/10 pain.  He took a muscle relaxer, baclofen 1049mhat helped some.  He did awake from sleep from this pain but was able to go back to sleep.  Past Medical History:  Diagnosis Date  . Diabetes mellitus without complication (HCCDisautel . Hyperlipidemia   . Hypertension   . Vitamin D deficiency      Allergies  Allergen Reactions  . Ppd [Tuberculin Purified Protein Derivative]     Positive PPD.    Current Outpatient Medications on File Prior to Visit  Medication Sig  . ACCU-CHEK SOFTCLIX LANCETS lancets Check blood sugar 1 time daily-DX-R73.09  . albuterol (VENTOLIN HFA) 108 (90 Base) MCG/ACT inhaler 2 inhalations 15 minutes apart every 4 hours to Rescue Asthma  . Alcohol Swabs (B-D SINGLE USE SWABS REGULAR) PADS Check blood sugar 1 time daily-DX-R73.09.  . aMarland Kitchenpirin 81 MG tablet Take 81 mg by mouth daily.  . aMarland Kitchenorvastatin (LIPITOR) 40 MG tablet Take 1 tablet daily  for Cholesterol  . baclofen (LIORESAL) 10 MG tablet TAKE 1/2 TO 1 TABLET TWICE DAILY ONLY AS NEEDED FOR MUSCLE SPASM(S)  . Blood Glucose Calibration (ACCU-CHEK AVIVA) SOLN Use per box instruction.  . Blood Glucose Monitoring Suppl (ACCU-CHEK AVIVA PLUS) w/Device KIT Check blood sugar 1 time daily-DX-R73.09  . Cholecalciferol (VITAMIN  D) 2000 UNITS CAPS Take 1 capsule by mouth daily.  Marland Kitchen DILT-XR 240 MG 24 hr capsule TAKE 1 CAPSULE DAILY FOR BLOOD PRESSURE AND HEART BEAT  . finasteride (PROSCAR) 5 MG tablet TAKE 1 TABLET DAILY FOR PROSTATE  . glucose blood (ACCU-CHEK AVIVA PLUS) test strip Check blood sugar 1 time daily-R73.09.  . losartan (COZAAR) 100 MG tablet Take 1 tablet daily for BP  . magnesium gluconate (MAGONATE) 500 MG tablet Take 500 mg by mouth  daily.   . montelukast (SINGULAIR) 10 MG tablet Take 1 tablet (10 mg total) by mouth daily.  . ondansetron (ZOFRAN ODT) 8 MG disintegrating tablet Dissolve 1 tablet under tongue every 6 to 8 hours if needed for Nau  . pantoprazole (PROTONIX) 40 MG tablet TAKE 1 TABLET EVERY DAY  . predniSONE (DELTASONE) 20 MG tablet 1 pill 3 x a day for 1 days, 1 pill 2 x a day x 3 days, 1 pill a day x 5 days with food  . ranitidine (ZANTAC) 300 MG tablet Take 1 to tablet daily for heartburn & reflux   No current facility-administered medications on file prior to visit.     ROS: all negative except above.   Physical Exam:  BP 120/86   Pulse 76   Temp 97.8 F (36.6 C)   Ht 5' 9.5" (1.765 m)   Wt 155 lb (70.3 kg)   SpO2 97%   BMI 22.56 kg/m   General Appearance: Well nourished, in no apparent distress. Respiratory: Respiratory effort normal, BS equal bilaterally without rales, rhonchi, wheezing or stridor.  Cardio: RRR with no MRGs. Brisk peripheral pulses without edema.  Abdomen: Soft, + BS.  Non tender, no guarding, rebound, hernias, masses. Lymphatics: Non tender without lymphadenopathy.  Musculoskeletal: Full ROM, 5/5 strength, normal gait.  Tenderness to palpation along right lateral upper leg from knee to hip. Negative pain with valgus or vargus stress.  Full weight bearing. Skin: Warm, dry without rashes, lesions, ecchymosis.  Neuro: Cranial nerves int Right lateralact. Normal muscle tone, no cerebellar symptoms. Sensation intact.  Psych: Awake and oriented X 3, normal affect, Insight and Judgment appropriate.     Garnet Sierras, NP 1:28 PM [facility]

## 2018-05-22 NOTE — Telephone Encounter (Signed)
-----   Message from Vicie Mutters, [place] sent at 05/22/2018  2:39 PM EST ----- Regarding: RE: fall Contact: [phone] Suggest office visit today or tomorrow.  Estill Bamberg ----- Message ----- From: Elenor Quinones, CMA Sent: 05/22/2018   2:26 PM EST To: Vicie Mutters, PA-C Subject: fall                                           Patient has slipped and fell down steps. Now has hip & back pain Patient is able to walk.  Please advise

## 2018-05-23 ENCOUNTER — Encounter: Payer: Self-pay | Admitting: Adult Health Nurse Practitioner

## 2018-05-23 ENCOUNTER — Ambulatory Visit (INDEPENDENT_AMBULATORY_CARE_PROVIDER_SITE_OTHER): Payer: Medicare HMO | Admitting: Adult Health Nurse Practitioner

## 2018-05-23 VITALS — BP 120/86 | HR 76 | Temp 97.8°F | Ht 69.5 in | Wt 155.0 lb

## 2018-05-23 DIAGNOSIS — I10 Essential (primary) hypertension: Secondary | ICD-10-CM

## 2018-05-23 DIAGNOSIS — F17200 Nicotine dependence, unspecified, uncomplicated: Secondary | ICD-10-CM | POA: Diagnosis not present

## 2018-05-23 DIAGNOSIS — S86911A Strain of unspecified muscle(s) and tendon(s) at lower leg level, right leg, initial encounter: Secondary | ICD-10-CM | POA: Diagnosis not present

## 2018-05-23 DIAGNOSIS — J449 Chronic obstructive pulmonary disease, unspecified: Secondary | ICD-10-CM

## 2018-05-23 MED ORDER — METHOCARBAMOL 750 MG PO TABS: 750.0000 mg | ORAL_TABLET | Freq: Three times a day (TID) | ORAL | 1 refills | Status: DC | PRN

## 2018-05-23 NOTE — Patient Instructions (Addendum)
Muscle Strain, Right leg  Take Ibuprofen 600mg  (3 tablets of 200mg ) every 6 hours or 800mg  (4 tablets of 200mg ) every 8 hours for the next 4-5 days.  You may continue Baclofen and we will also send in Robaxin to take at bedtime.  Take one or the other to help relax those muscle.  Apply warm moist heat to the area, especially in the morning.   May contact office via phone [phone] or [place].   Call or return with new or worsening symptoms as discussed in appointment.    Muscle Strain A muscle strain is an injury that happens when a muscle is stretched longer than normal. This can happen during a fall, sports, or lifting. This can tear some muscle fibers. Usually, recovery from muscle strain takes 1-2 weeks. Complete healing normally takes 5-6 weeks. This condition is first treated with PRICE therapy. This involves:  Protecting your muscle from being injured again.  Resting your injured muscle.  Icing your injured muscle.  Applying pressure (compression) to your injured muscle. This may be done with a splint or elastic bandage.  Raising (elevating) your injured muscle. Your doctor may also recommend medicine for pain. Follow these instructions at home: If you have a splint:  Wear the splint as told by your doctor. Take it off only as told by your doctor.  Loosen the splint if your fingers or toes tingle, get numb, or turn cold and blue.  Keep the splint clean.  If the splint is not waterproof: ? Do not let it get wet. ? Cover it with a watertight covering when you take a bath or a shower. Managing pain, stiffness, and swelling   If directed, put ice on your injured area. ? If you have a removable splint, take it off as told by your doctor. ? Put ice in a plastic bag. ? Place a towel between your skin and the bag. ? Leave the ice on for 20 minutes, 2-3 times a day.  Move your fingers or toes often. This helps to avoid stiffness and lessen swelling.  Raise  your injured area above the level of your heart while you are sitting or lying down.  Wear an elastic bandage as told by your doctor. Make sure it is not too tight. General instructions  Take over-the-counter and prescription medicines only as told by your doctor.  Limit your activity. Rest your injured muscle as told by your doctor. Your doctor may say that gentle movements are okay.  If physical therapy was prescribed, do exercises as told by your doctor.  Do not put pressure on any part of the splint until it is fully hardened. This may take many hours.  Do not use any products that contain nicotine or tobacco, such as cigarettes and e-cigarettes. These can delay bone healing. If you need help quitting, ask your doctor.  Warm up before you exercise. This helps to prevent more muscle strains.  Ask your doctor when it is safe to drive if you have a splint.  Keep all follow-up visits as told by your doctor. This is important. Contact a doctor if:  You have more pain or swelling in your injured area. Get help right away if:  You have any of these problems in your injured area: ? You have numbness. ? You have tingling. ? You lose a lot of strength. Summary  A muscle strain is an injury that happens when a muscle is stretched longer than normal.  This condition is  first treated with PRICE therapy. This includes protecting, resting, icing, adding pressure, and raising your injury.  Limit your activity. Rest your injured muscle as told by your doctor. Your doctor may say that gentle movements are okay.  Warm up before you exercise. This helps to prevent more muscle strains. This information is not intended to replace advice given to you by your health care provider. Make sure you discuss any questions you have with your health care provider. Document Released: 12/20/2007 Document Revised: 04/18/2016 Document Reviewed: 04/18/2016 Elsevier Interactive Patient Education  2019  [employer].

## 2018-05-27 LAB — HM DIABETES EYE EXAM

## 2018-06-06 DIAGNOSIS — H524 Presbyopia: Secondary | ICD-10-CM | POA: Diagnosis not present

## 2018-06-06 DIAGNOSIS — D3131 Benign neoplasm of right choroid: Secondary | ICD-10-CM | POA: Diagnosis not present

## 2018-06-06 DIAGNOSIS — Z961 Presence of intraocular lens: Secondary | ICD-10-CM | POA: Diagnosis not present

## 2018-06-06 DIAGNOSIS — H52222 Regular astigmatism, left eye: Secondary | ICD-10-CM | POA: Diagnosis not present

## 2018-06-06 DIAGNOSIS — I10 Essential (primary) hypertension: Secondary | ICD-10-CM | POA: Diagnosis not present

## 2018-06-06 DIAGNOSIS — H26492 Other secondary cataract, left eye: Secondary | ICD-10-CM | POA: Diagnosis not present

## 2018-06-06 DIAGNOSIS — E119 Type 2 diabetes mellitus without complications: Secondary | ICD-10-CM | POA: Diagnosis not present

## 2018-06-06 DIAGNOSIS — H5201 Hypermetropia, right eye: Secondary | ICD-10-CM | POA: Diagnosis not present

## 2018-06-06 DIAGNOSIS — Z7984 Long term (current) use of oral hypoglycemic drugs: Secondary | ICD-10-CM | POA: Diagnosis not present

## 2018-06-18 ENCOUNTER — Encounter: Payer: Self-pay | Admitting: *Deleted

## 2018-06-26 ENCOUNTER — Ambulatory Visit: Payer: Self-pay | Admitting: Adult Health

## 2018-06-26 NOTE — Progress Notes (Signed)
FOLLOW UP  Assessment and Plan:   Hypertension Well controlled with current medications  Monitor blood pressure at home; patient to call if consistently greater than 130/80 Continue DASH diet.   Reminder to go to the ER if any CP, SOB, nausea, dizziness, severe HA, changes vision/speech, left arm numbness and tingling and jaw pain.  Cholesterol Currently near goal by statin; consider switch to rosuvastatin 40 mg if LDL trending up Continue low cholesterol diet and exercise.  Check lipid panel.   Prediabetes Continue diet and exercise.  Perform daily foot/skin check, notify office of any concerning changes.  Check A1C  Acid reflux Well managed on current medications Discussed diet, avoiding triggers and other lifestyle changes  Normal weight (BMI 22 ) Continue to recommend diet heavy in fruits and veggies and low in animal meats, cheeses, and dairy products, appropriate calorie intake Discuss exercise recommendations routinely Continue to monitor weight at each visit  Vitamin D Def Below goal at last visit; discussed increasing supplementation to reach goal of 70-100 Defer Vit D level  Tobacco use Discussed risks associated with tobacco use and advised to reduce or quit Patient is not ready to do so, but advised to consider strongly Will follow up at the next visit  Continue diet and meds as discussed. Further disposition pending results of labs. Discussed med's effects and SE's.   Over 30 minutes of exam, counseling, chart review, and critical decision making was performed.   Future Appointments  Date Time Provider [place]  10/13/2018  3:00 PM Unk Pinto, MD [facility] None  03/31/2019 11:15 AM Liane Comber, NP [facility] None    ----------------------------------------------------------------------------------------------------------------------  HPI 73 y.o. male  presents for 3 month follow up on hypertension, cholesterol, prediabetes, weight and  vitamin D deficiency.   He has COPD, is a smoker, interested in quitting, tried wellbutrin and chantix didn't help, smokes 0-4 cigs a day, plans to continue to taper. he is trying to chew gum when stressed to cut back.   he has a diagnosis of GERD which is currently managed by lifestyle modification and protonix 40 mg daily. he reports symptoms are currently well controlled, and denies breakthrough reflux, burning in chest, hoarseness or cough.    BMI is Body mass index is 22.04 kg/m., he has been working on diet and exercise. He admits he is not eating as much due to not being able to go to restaurants due to covid 19.  Wt Readings from Last 3 Encounters:  06/27/18 151 lb 6.4 oz (68.7 kg)  05/23/18 155 lb (70.3 kg)  05/16/18 153 lb 9.6 oz (69.7 kg)   His blood pressure has been controlled at home, today their BP is BP: 124/74  He does workout. He denies chest pain, shortness of breath, dizziness.   He is on cholesterol medication (atorvastatin 40 mg daily) and denies myalgias. His cholesterol is not at goal. The cholesterol last visit was:   Lab Results  Component Value Date   CHOL 180 03/25/2018   HDL 37 (L) 03/25/2018   LDLCALC 109 (H) 03/25/2018   TRIG 220 (H) 03/25/2018   CHOLHDL 4.9 03/25/2018    He has been working on diet and exercise for prediabetes, and denies foot ulcerations, hyperglycemia, hypoglycemia , increased appetite, nausea, paresthesia of the feet, polydipsia, polyuria, visual disturbances, vomiting and weight loss. He checks a fasting sugar occasionally, runs 90-130. Last A1C in the office was:  Lab Results  Component Value Date   HGBA1C 6.0 (H) 03/25/2018  Patient is on Vitamin D supplement but remains below goal of 60:    Lab Results  Component Value Date   VD25OH 56 12/17/2017    He did not increase dose.   Current Medications:  Current Outpatient Medications on File Prior to Visit  Medication Sig  . ACCU-CHEK SOFTCLIX LANCETS lancets Check blood  sugar 1 time daily-DX-R73.09  . albuterol (VENTOLIN HFA) 108 (90 Base) MCG/ACT inhaler 2 inhalations 15 minutes apart every 4 hours to Rescue Asthma  . Alcohol Swabs (B-D SINGLE USE SWABS REGULAR) PADS Check blood sugar 1 time daily-DX-R73.09.  Marland Kitchen aspirin 81 MG tablet Take 81 mg by mouth daily.  Marland Kitchen atorvastatin (LIPITOR) 40 MG tablet Take 1 tablet daily  for Cholesterol  . baclofen (LIORESAL) 10 MG tablet TAKE 1/2 TO 1 TABLET TWICE DAILY ONLY AS NEEDED FOR MUSCLE SPASM(S)  . Blood Glucose Calibration (ACCU-CHEK AVIVA) SOLN Use per box instruction.  . Blood Glucose Monitoring Suppl (ACCU-CHEK AVIVA PLUS) w/Device KIT Check blood sugar 1 time daily-DX-R73.09  . Cholecalciferol (VITAMIN D) 2000 UNITS CAPS Take 1 capsule by mouth daily.  Marland Kitchen DILT-XR 240 MG 24 hr capsule TAKE 1 CAPSULE DAILY FOR BLOOD PRESSURE AND HEART BEAT  . finasteride (PROSCAR) 5 MG tablet TAKE 1 TABLET DAILY FOR PROSTATE  . glucose blood (ACCU-CHEK AVIVA PLUS) test strip Check blood sugar 1 time daily-R73.09.  . magnesium gluconate (MAGONATE) 500 MG tablet Take 500 mg by mouth daily.   . methocarbamol (ROBAXIN-750) 750 MG tablet Take 1 tablet (750 mg total) by mouth every 8 (eight) hours as needed for muscle spasms.  . ondansetron (ZOFRAN ODT) 8 MG disintegrating tablet Dissolve 1 tablet under tongue every 6 to 8 hours if needed for Nau  . pantoprazole (PROTONIX) 40 MG tablet TAKE 1 TABLET EVERY DAY   No current facility-administered medications on file prior to visit.      Allergies:  Allergies  Allergen Reactions  . Ppd [Tuberculin Purified Protein Derivative]     Positive PPD.     Medical History:  Past Medical History:  Diagnosis Date  . Diabetes mellitus without complication ([place])   . Hyperlipidemia   . Hypertension   . Vitamin D deficiency    Family history- Reviewed and unchanged Social history- Reviewed and unchanged   Review of Systems:  Review of Systems  Constitutional: Negative for malaise/fatigue  and weight loss.  HENT: Negative for hearing loss and tinnitus.   Eyes: Negative for blurred vision and double vision.  Respiratory: Negative for cough, shortness of breath and wheezing.   Cardiovascular: Negative for chest pain, palpitations, orthopnea, claudication and leg swelling.  Gastrointestinal: Negative for abdominal pain, blood in stool, constipation, diarrhea, heartburn, melena, nausea and vomiting.  Genitourinary: Negative.   Musculoskeletal: Negative for joint pain and myalgias.  Skin: Negative for rash.  Neurological: Negative for dizziness, tingling, sensory change, weakness and headaches.  Endo/Heme/Allergies: Positive for environmental allergies. Negative for polydipsia.  Psychiatric/Behavioral: Negative.   All other systems reviewed and are negative.   Physical Exam: BP 124/74   Pulse 66   Temp 98.1 F (36.7 C)   Ht 5' 9.5" (1.765 m)   Wt 151 lb 6.4 oz (68.7 kg)   SpO2 98%   BMI 22.04 kg/m  Wt Readings from Last 3 Encounters:  06/27/18 151 lb 6.4 oz (68.7 kg)  05/23/18 155 lb (70.3 kg)  05/16/18 153 lb 9.6 oz (69.7 kg)   General Appearance: Well nourished, in no apparent distress. Eyes: PERRLA, EOMs, conjunctiva  no swelling or erythema Sinuses: No Frontal/maxillary tenderness ENT/Mouth: Ext aud canals clear, TMs without erythema, bulging. No erythema, swelling, or exudate on post pharynx.  Tonsils not swollen or erythematous. Hearing normal.  Neck: Supple, thyroid normal.  Respiratory: Respiratory effort normal, BS equal bilaterally without rales, rhonchi, wheezing or stridor.  Cardio: RRR with no MRGs. Brisk peripheral pulses without edema.  Abdomen: Soft, + BS.  Non tender, no guarding, rebound, hernias, masses. Lymphatics: Non tender without lymphadenopathy.  Musculoskeletal: Full ROM, 5/5 strength, Normal gait Skin: Warm, dry without rashes, lesions, ecchymosis.  Neuro: Cranial nerves intact. No cerebellar symptoms.  Psych: Awake and oriented X 3,  normal affect, Insight and Judgment appropriate.    Izora Ribas, NP 11:48 AM [facility]

## 2018-06-27 ENCOUNTER — Other Ambulatory Visit: Payer: Self-pay

## 2018-06-27 ENCOUNTER — Ambulatory Visit (INDEPENDENT_AMBULATORY_CARE_PROVIDER_SITE_OTHER): Payer: Medicare HMO | Admitting: Adult Health

## 2018-06-27 ENCOUNTER — Encounter: Payer: Self-pay | Admitting: Adult Health

## 2018-06-27 VITALS — BP 124/74 | HR 66 | Temp 98.1°F | Ht 69.5 in | Wt 151.4 lb

## 2018-06-27 DIAGNOSIS — Z6822 Body mass index (BMI) 22.0-22.9, adult: Secondary | ICD-10-CM | POA: Diagnosis not present

## 2018-06-27 DIAGNOSIS — R7309 Other abnormal glucose: Secondary | ICD-10-CM | POA: Diagnosis not present

## 2018-06-27 DIAGNOSIS — Z79899 Other long term (current) drug therapy: Secondary | ICD-10-CM | POA: Diagnosis not present

## 2018-06-27 DIAGNOSIS — K219 Gastro-esophageal reflux disease without esophagitis: Secondary | ICD-10-CM

## 2018-06-27 DIAGNOSIS — F17200 Nicotine dependence, unspecified, uncomplicated: Secondary | ICD-10-CM | POA: Diagnosis not present

## 2018-06-27 DIAGNOSIS — E782 Mixed hyperlipidemia: Secondary | ICD-10-CM

## 2018-06-27 DIAGNOSIS — E559 Vitamin D deficiency, unspecified: Secondary | ICD-10-CM

## 2018-06-27 DIAGNOSIS — J449 Chronic obstructive pulmonary disease, unspecified: Secondary | ICD-10-CM

## 2018-06-27 DIAGNOSIS — I10 Essential (primary) hypertension: Secondary | ICD-10-CM | POA: Diagnosis not present

## 2018-06-27 MED ORDER — MONTELUKAST SODIUM 10 MG PO TABS: 10.0000 mg | ORAL_TABLET | Freq: Every day | ORAL | 3 refills | Status: DC

## 2018-06-27 MED ORDER — LOSARTAN POTASSIUM 100 MG PO TABS: ORAL_TABLET | ORAL | 1 refills | Status: DC

## 2018-06-27 NOTE — Patient Instructions (Addendum)
Goals    . HEMOGLOBIN A1C < 5.7    . LDL CALC < 100    . Quit Smoking        Coping with Quitting Smoking  Quitting smoking is a physical and mental challenge. You will face cravings, withdrawal symptoms, and temptation. Before quitting, work with your health care provider to make a plan that can help you cope. Preparation can help you quit and keep you from giving in. How can I cope with cravings? Cravings usually last for 5-10 minutes. If you get through it, the craving will pass. Consider taking the following actions to help you cope with cravings:  Keep your mouth busy: ? Chew sugar-free gum. ? Suck on hard candies or a straw. ? Brush your teeth.  Keep your hands and body busy: ? Immediately change to a different activity when you feel a craving. ? Squeeze or play with a ball. ? Do an activity or a hobby, like making bead jewelry, practicing needlepoint, or working with wood. ? Mix up your normal routine. ? Take a short exercise break. Go for a quick walk or run up and down stairs. ? Spend time in public places where smoking is not allowed.  Focus on doing something kind or helpful for someone else.  Call a friend or family member to talk during a craving.  Join a support group.  Call a quit line, such as 1-800-QUIT-NOW.  Talk with your health care provider about medicines that might help you cope with cravings and make quitting easier for you. How can I deal with withdrawal symptoms? Your body may experience negative effects as it tries to get used to not having nicotine in the system. These effects are called withdrawal symptoms. They may include:  Feeling hungrier than normal.  Trouble concentrating.  Irritability.  Trouble sleeping.  Feeling depressed.  Restlessness and agitation.  Craving a cigarette. To manage withdrawal symptoms:  Avoid places, people, and activities that trigger your cravings.  Remember why you want to quit.  Get plenty of  sleep.  Avoid coffee and other caffeinated drinks. These may worsen some of your symptoms. How can I handle social situations? Social situations can be difficult when you are quitting smoking, especially in the first few weeks. To manage this, you can:  Avoid parties, bars, and other social situations where people might be smoking.  Avoid alcohol.  Leave right away if you have the urge to smoke.  Explain to your family and friends that you are quitting smoking. Ask for understanding and support.  Plan activities with friends or family where smoking is not an option. What are some ways I can cope with stress? Wanting to smoke may cause stress, and stress can make you want to smoke. Find ways to manage your stress. Relaxation techniques can help. For example:  Breathe slowly and deeply, in through your nose and out through your mouth.  Listen to soothing, relaxing music.  Talk with a family member or friend about your stress.  Light a candle.  Soak in a bath or take a shower.  Think about a peaceful place. What are some ways I can prevent weight gain? Be aware that many people gain weight after they quit smoking. However, not everyone does. To keep from gaining weight, have a plan in place before you quit and stick to the plan after you quit. Your plan should include:  Having healthy snacks. When you have a craving, it may help to: ? Eat  plain popcorn, crunchy carrots, celery, or other cut vegetables. ? Chew sugar-free gum.  Changing how you eat: ? Eat small portion sizes at meals. ? Eat 4-6 small meals throughout the day instead of 1-2 large meals a day. ? Be mindful when you eat. Do not watch television or do other things that might distract you as you eat.  Exercising regularly: ? Make time to exercise each day. If you do not have time for a long workout, do short bouts of exercise for 5-10 minutes several times a day. ? Do some form of strengthening exercise, like weight  lifting, and some form of aerobic exercise, like running or swimming.  Drinking plenty of water or other low-calorie or no-calorie drinks. Drink 6-8 glasses of water daily, or as much as instructed by your health care provider. Summary  Quitting smoking is a physical and mental challenge. You will face cravings, withdrawal symptoms, and temptation to smoke again. Preparation can help you as you go through these challenges.  You can cope with cravings by keeping your mouth busy (such as by chewing gum), keeping your body and hands busy, and making calls to family, friends, or a helpline for people who want to quit smoking.  You can cope with withdrawal symptoms by avoiding places where people smoke, avoiding drinks with caffeine, and getting plenty of rest.  Ask your health care provider about the different ways to prevent weight gain, avoid stress, and handle social situations. This information is not intended to replace advice given to you by your health care provider. Make sure you discuss any questions you have with your health care provider. Document Released: 03/09/2016 Document Revised: 03/09/2016 Document Reviewed: 03/09/2016 Elsevier Interactive Patient Education  2019 [employer].

## 2018-06-28 ENCOUNTER — Other Ambulatory Visit: Payer: Self-pay | Admitting: Adult Health

## 2018-06-28 DIAGNOSIS — E0590 Thyrotoxicosis, unspecified without thyrotoxic crisis or storm: Secondary | ICD-10-CM | POA: Insufficient documentation

## 2018-06-28 LAB — COMPLETE METABOLIC PANEL WITH GFR
AG Ratio: 1.7 (calc) (ref 1.0–2.5)
ALT: 11 U/L (ref 9–46)
AST: 13 U/L (ref 10–35)
Albumin: 4.2 g/dL (ref 3.6–5.1)
Alkaline phosphatase (APISO): 109 U/L (ref 35–144)
BUN: 11 mg/dL (ref 7–25)
CO2: 25 mmol/L (ref 20–32)
Calcium: 10.3 mg/dL (ref 8.6–10.3)
Chloride: 101 mmol/L (ref 98–110)
Creat: 0.99 mg/dL (ref 0.70–1.18)
GFR, Est African American: 87 mL/min/{1.73_m2} (ref >=60)
GFR, Est Non African American: 75 mL/min/{1.73_m2} (ref >=60)
Globulin: 2.5 g/dL (calc) (ref 1.9–3.7)
Glucose, Bld: 111 mg/dL — ABNORMAL HIGH (ref 65–99)
Potassium: 4.5 mmol/L (ref 3.5–5.3)
Sodium: 137 mmol/L (ref 135–146)
Total Bilirubin: 0.4 mg/dL (ref 0.2–1.2)
Total Protein: 6.7 g/dL (ref 6.1–8.1)

## 2018-06-28 LAB — CBC WITH DIFFERENTIAL/PLATELET
Absolute Monocytes: 504 cells/uL (ref 200–950)
Basophils Absolute: 83 cells/uL (ref 0–200)
Basophils Relative: 1.2 %
Eosinophils Absolute: 62 cells/uL (ref 15–500)
Eosinophils Relative: 0.9 %
HCT: 43.6 % (ref 38.5–50.0)
Hemoglobin: 14.9 g/dL (ref 13.2–17.1)
Lymphs Abs: 1470 cells/uL (ref 850–3900)
MCH: 31.7 pg (ref 27.0–33.0)
MCHC: 34.2 g/dL (ref 32.0–36.0)
MCV: 92.8 fL (ref 80.0–100.0)
MPV: 10.5 fL (ref 7.5–12.5)
Monocytes Relative: 7.3 %
Neutro Abs: 4782 cells/uL (ref 1500–7800)
Neutrophils Relative %: 69.3 %
Platelets: 344 10*3/uL (ref 140–400)
RBC: 4.7 10*6/uL (ref 4.20–5.80)
RDW: 12.7 % (ref 11.0–15.0)
Total Lymphocyte: 21.3 %
WBC: 6.9 10*3/uL (ref 3.8–10.8)

## 2018-06-28 LAB — HEMOGLOBIN A1C
Hgb A1c MFr Bld: 6.3 % of total Hgb — ABNORMAL HIGH (ref <5.7)
Mean Plasma Glucose: 134 (calc)
eAG (mmol/L): 7.4 (calc)

## 2018-06-28 LAB — LIPID PANEL
Cholesterol: 240 mg/dL — ABNORMAL HIGH (ref <200)
HDL: 35 mg/dL — ABNORMAL LOW (ref >=40)
LDL Cholesterol (Calc): 165 mg/dL (calc) — ABNORMAL HIGH
Non-HDL Cholesterol (Calc): 205 mg/dL (calc) — ABNORMAL HIGH (ref <130)
Total CHOL/HDL Ratio: 6.9 (calc) — ABNORMAL HIGH (ref <5.0)
Triglycerides: 234 mg/dL — ABNORMAL HIGH (ref <150)

## 2018-06-28 LAB — MAGNESIUM: Magnesium: 1.8 mg/dL (ref 1.5–2.5)

## 2018-06-28 LAB — TSH: TSH: 0.23 mIU/L — ABNORMAL LOW (ref 0.40–4.50)

## 2018-06-28 MED ORDER — ROSUVASTATIN CALCIUM 40 MG PO TABS: 40.0000 mg | ORAL_TABLET | Freq: Every day | ORAL | 1 refills | Status: DC

## 2018-07-16 NOTE — Progress Notes (Signed)
Virtual Visit via Telephone Note  I connected with [name] on 07/18/18 at  9:00 AM EDT by telephone and verified that I am speaking with the correct person using two identifiers.   I discussed the limitations, risks, security and privacy concerns of performing an evaluation and management service by telephone and the availability of in person appointments. I also discussed with the patient that there may be a patient responsible charge related to this service. The patient expressed understanding and agreed to proceed.   I discussed the assessment and treatment plan with the patient. The patient was provided an opportunity to ask questions and all were answered. The patient agreed with the plan and demonstrated an understanding of the instructions.   The patient was advised to call back or seek an in-person evaluation if the symptoms worsen or if the condition fails to improve as anticipated.  I provided 16 minutes of non-face-to-face time during this encounter.   Jesus Ribas, NP     MEDICARE ANNUAL WELLNESS VISIT AND FOLLOW UP Assessment:  : Encounter for Medicare annual wellness exam 1 year  Essential hypertension -     CBC with Differential/Platelet -     CMP/GFR -     TSH - continue medications, DASH diet, exercise and monitor at home. Call if greater than 130/80.   Abnormal glucose -     Hemoglobin A1c annually  Discussed general issues about diabetes pathophysiology and management., Educational material distributed., Suggested low cholesterol diet., Encouraged aerobic exercise., Discussed foot care., Reminded to get yearly retinal exam.  Mixed hyperlipidemia -     Lipid panel at routine OVs -continue medications, check lipids, decrease fatty foods, increase activity.   Medication management -     Magnesium  Tobacco use disorder Continue annual CXR, not candidate for low dose CT at this time Discussed risks associated with tobacco use and advised to reduce  or quit Declines Chantix or other medication Will follow up at the next visit  Chronic obstructive pulmonary disease, unspecified COPD type ([place]) Advised to stop smoking, will get CXR, continue meds. Denies symptoms.   Gastroesophageal reflux disease, esophagitis presence not specified Well managed by lifestyle and PRN tums at this time Discussed diet, avoiding triggers and other lifestyle changes  Vitamin D deficiency At goal at recent check; continue to recommend supplementation for goal of 70-100 Defer vitamin D level  Atherosclerosis of aorta (HCC) Control blood pressure, cholesterol, glucose, increase exercise.  Stop smoking -     Lipid panel   Over 30 minutes of exam, counseling, chart review, and critical decision making was performed Future Appointments  Date Time Provider [place]  10/13/2018  3:00 PM Unk Pinto, MD [facility] None    Plan:   During the course of the visit the patient was educated and counseled about appropriate screening and preventive services including:    Pneumococcal vaccine   Influenza vaccine  Prevnar 13  Td vaccine  Screening electrocardiogram  Colorectal cancer screening  Diabetes screening  Glaucoma screening  Nutrition counseling    Subjective:  Jesus Bush is a 73 y.o. male who presents for Medicare Annual Wellness Visit   Just had 3 month follow up. No concerns today.   BMI is Body mass index is 22.42 kg/m., he has been working on diet and exercise. Wt Readings from Last 3 Encounters:  07/18/18 154 lb (69.9 kg)  06/27/18 151 lb 6.4 oz (68.7 kg)  05/23/18 155 lb (70.3 kg)   His  blood pressure has been controlled at home, today their BP is BP: 124/81 He does not workout. He denies chest pain, shortness of breath, dizziness.   He has COPD, is a smoker, interested in quitting,tried wellbutrin and chantix didn't help,smokes 0-4 cigs a day, plans to continue to taper.he is trying to chew gum when  stressed to cut back.      Medication Review: Current Outpatient Medications on File Prior to Visit  Medication Sig Dispense Refill  . ACCU-CHEK SOFTCLIX LANCETS lancets Check blood sugar 1 time daily-DX-R73.09 100 each 3  . albuterol (VENTOLIN HFA) 108 (90 Base) MCG/ACT inhaler 2 inhalations 15 minutes apart every 4 hours to Rescue Asthma 48 g 3  . Alcohol Swabs (B-D SINGLE USE SWABS REGULAR) PADS Check blood sugar 1 time daily-DX-R73.09. 100 each 3  . aspirin 81 MG tablet Take 81 mg by mouth daily.    . baclofen (LIORESAL) 10 MG tablet TAKE 1/2 TO 1 TABLET TWICE DAILY ONLY AS NEEDED FOR MUSCLE SPASM(S) 180 tablet 0  . Blood Glucose Calibration (ACCU-CHEK AVIVA) SOLN Use per box instruction. 1 each 3  . Blood Glucose Monitoring Suppl (ACCU-CHEK AVIVA PLUS) w/Device KIT Check blood sugar 1 time daily-DX-R73.09 1 kit 0  . Cholecalciferol (VITAMIN D) 2000 UNITS CAPS Take 1 capsule by mouth daily.    Marland Kitchen DILT-XR 240 MG 24 hr capsule TAKE 1 CAPSULE DAILY FOR BLOOD PRESSURE AND HEART BEAT 90 capsule 1  . finasteride (PROSCAR) 5 MG tablet TAKE 1 TABLET DAILY FOR PROSTATE 90 tablet 3  . glucose blood (ACCU-CHEK AVIVA PLUS) test strip Check blood sugar 1 time daily-R73.09. 100 each 3  . losartan (COZAAR) 100 MG tablet Take 1 tablet daily for BP 90 tablet 1  . magnesium gluconate (MAGONATE) 500 MG tablet Take 500 mg by mouth daily.     . methocarbamol (ROBAXIN-750) 750 MG tablet Take 1 tablet (750 mg total) by mouth every 8 (eight) hours as needed for muscle spasms. 60 tablet 1  . montelukast (SINGULAIR) 10 MG tablet Take 1 tablet (10 mg total) by mouth daily. 90 tablet 3  . ondansetron (ZOFRAN ODT) 8 MG disintegrating tablet Dissolve 1 tablet under tongue every 6 to 8 hours if needed for Nau 20 tablet 0  . pantoprazole (PROTONIX) 40 MG tablet TAKE 1 TABLET EVERY DAY 90 tablet 1  . rosuvastatin (CRESTOR) 40 MG tablet Take 1 tablet (40 mg total) by mouth daily. 90 tablet 1   No current  facility-administered medications on file prior to visit.     Current Problems (verified) Patient Active Problem List   Diagnosis Date Noted  . Hyperthyroidism 06/28/2018  . Aortic atherosclerosis ([place]) 09/09/2017  . COPD (chronic obstructive pulmonary disease) ([place]) 11/08/2016  . Esophageal reflux 04/25/2015  . Tobacco use disorder 07/23/2014  . Medication management 04/13/2014  . Essential hypertension 02/11/2013  . Mixed hyperlipidemia 02/11/2013  . Vitamin D deficiency 02/11/2013  . Abnormal glucose (prediabetes) 05/12/2012    Screening Tests Immunization History  Administered Date(s) Administered  . DTaP 05/23/2006  . Influenza Whole 12/16/2012  . Influenza, High Dose Seasonal PF 01/17/2015, 12/13/2015, 01/29/2017, 12/17/2017  . Influenza-Unspecified 12/27/2013  . Pneumococcal Conjugate-13 04/13/2014  . Pneumococcal Polysaccharide-23 09/13/2008, 08/06/2016  . Td 08/06/2016  . Zoster 04/17/2013    Preventative care: Last colonoscopy: 2009  Cologuard: 08/2017 neg CXR 08/2017 he does smoke very little,   doesn't meet criteria for CT screening (25 pack year hx)  Prior vaccinations: TD or Tdap: 2018 Influenza: 2019  Pneumococcal: 2010 Prevnar13: 2016 Shingles/Zostavax: 2015  Names of Other Physician/Practitioners you currently use: 1. [facility] here for primary care 2. Dr. Einar Gip, eye doctor, last visit 2020 3. Does not see, dentures, dentist  Patient Care Team: Unk Pinto, MD as PCP - General (Internal Medicine) Unk Pinto, MD (Internal Medicine) Inda Castle, MD (Inactive) as Consulting Physician (Gastroenterology)  Allergies Allergies  Allergen Reactions  . Ppd [Tuberculin Purified Protein Derivative]     Positive PPD.    SURGICAL HISTORY He  has a past surgical history that includes Cataract extraction, bilateral (Bilateral, 2018). FAMILY HISTORY His family history includes Diabetes in his  sister; Heart attack in his brother, father, and mother; Heart disease in his father, mother, and sister; Hypertension in his brother. SOCIAL HISTORY He  reports that he has been smoking cigarettes. He has a 25.00 pack-year smoking history. He has never used smokeless tobacco. He reports that he does not drink alcohol or use drugs.  MEDICARE WELLNESS OBJECTIVES: Physical activity: Current Exercise Habits: The patient has a physically strenuous job, but has no regular exercise apart from work.(walking all day, up and down steps, lots of lifting), Exercise limited by: None identified Cardiac risk factors: Cardiac Risk Factors include: advanced age (>72mn, >>24women);dyslipidemia;male gender;hypertension;smoking/ tobacco exposure Depression/mood screen:   [facility]18[facility]  Decreased Interest 0  Down, Depressed, Hopeless 0  PHQ - 2 Score 0    ADLs:  In your present state of health, do you have any difficulty performing the following activities: 07/18/2018 07/18/2018  Hearing? YTempie Donning Comment per wife Turns TV way up, patient feels he is doing well but family disagrees  Vision? N N  Difficulty concentrating or making decisions? N N  Walking or climbing stairs? N N  Dressing or bathing? N N  Doing errands, shopping? N N  Some recent data might be hidden     Cognitive Testing  Alert? Yes  Normal Appearance?Yes  Oriented to person? Yes  Place? Yes   Time? Yes  Recall of three objects?  Yes  Can perform simple calculations? Yes  Displays appropriate judgment?Yes  Can read the correct time from a watch face?Yes  EOL planning: Does Patient Have a Medical Advance Directive?: No Would patient like information on creating a medical advance directive?: No - Patient declined   Objective:   Today's Vitals   07/18/18 0901  BP: 124/81  Pulse: 95  Weight: 154 lb (69.9 kg)   Body mass index is 22.42 kg/m.  General : Well sounding patient in no apparent distress HEENT: no  hoarseness, no cough for duration of visit Lungs: speaks in complete sentences, no audible wheezing, no apparent distress Neurological: alert, oriented x 3 Psychiatric: pleasant, judgement appropriate    Medicare Attestation I have personally reviewed: The patient's medical and social history Their use of alcohol, tobacco or illicit drugs Their current medications and supplements The patient's functional ability including ADLs,fall risks, home safety risks, cognitive, and hearing and visual impairment Diet and physical activities Evidence for depression or mood disorders  The patient's weight, height, BMI, and visual acuity have been recorded in the chart.  I have made referrals, counseling, and provided education to the patient based on review of the above and I have provided the patient with a written personalized care plan for preventive services.     AIzora Ribas NP   07/18/2018

## 2018-07-18 ENCOUNTER — Other Ambulatory Visit: Payer: Self-pay

## 2018-07-18 ENCOUNTER — Ambulatory Visit: Payer: Medicare HMO | Admitting: Adult Health

## 2018-07-18 ENCOUNTER — Encounter: Payer: Self-pay | Admitting: Adult Health

## 2018-07-18 VITALS — BP 124/81 | HR 95 | Wt 154.0 lb

## 2018-07-18 DIAGNOSIS — E782 Mixed hyperlipidemia: Secondary | ICD-10-CM

## 2018-07-18 DIAGNOSIS — E0590 Thyrotoxicosis, unspecified without thyrotoxic crisis or storm: Secondary | ICD-10-CM | POA: Diagnosis not present

## 2018-07-18 DIAGNOSIS — J449 Chronic obstructive pulmonary disease, unspecified: Secondary | ICD-10-CM

## 2018-07-18 DIAGNOSIS — I10 Essential (primary) hypertension: Secondary | ICD-10-CM

## 2018-07-18 DIAGNOSIS — E559 Vitamin D deficiency, unspecified: Secondary | ICD-10-CM

## 2018-07-18 DIAGNOSIS — K219 Gastro-esophageal reflux disease without esophagitis: Secondary | ICD-10-CM | POA: Diagnosis not present

## 2018-07-18 DIAGNOSIS — R7309 Other abnormal glucose: Secondary | ICD-10-CM

## 2018-07-18 DIAGNOSIS — R6889 Other general symptoms and signs: Secondary | ICD-10-CM | POA: Diagnosis not present

## 2018-07-18 DIAGNOSIS — Z0000 Encounter for general adult medical examination without abnormal findings: Secondary | ICD-10-CM

## 2018-07-18 DIAGNOSIS — Z79899 Other long term (current) drug therapy: Secondary | ICD-10-CM

## 2018-07-18 DIAGNOSIS — Z6822 Body mass index (BMI) 22.0-22.9, adult: Secondary | ICD-10-CM

## 2018-07-18 DIAGNOSIS — I700 Atherosclerosis of aorta: Secondary | ICD-10-CM

## 2018-07-18 DIAGNOSIS — Z0001 Encounter for general adult medical examination with abnormal findings: Secondary | ICD-10-CM | POA: Diagnosis not present

## 2018-07-18 DIAGNOSIS — F17200 Nicotine dependence, unspecified, uncomplicated: Secondary | ICD-10-CM | POA: Diagnosis not present

## 2018-07-21 ENCOUNTER — Other Ambulatory Visit: Payer: Self-pay

## 2018-08-04 ENCOUNTER — Ambulatory Visit (INDEPENDENT_AMBULATORY_CARE_PROVIDER_SITE_OTHER): Payer: Medicare HMO | Admitting: Physician Assistant

## 2018-08-04 ENCOUNTER — Other Ambulatory Visit: Payer: Self-pay

## 2018-08-04 VITALS — BP 122/80 | HR 68 | Temp 97.2°F | Ht 69.5 in | Wt 153.1 lb

## 2018-08-04 DIAGNOSIS — N6320 Unspecified lump in the left breast, unspecified quadrant: Secondary | ICD-10-CM | POA: Diagnosis not present

## 2018-08-04 NOTE — Patient Instructions (Signed)
Breast Cancer, Male Men have a small amount of breast tissue and can get breast cancer. Breast cancer is a malignant growth of tissue(tumor) in the breast. Unlike noncancerous (benign) tumors, malignant tumors are cancerous and can spread to other parts of the body. What are the causes? The cause of male breast cancer is unknown. What increases the risk? The following factors may make you more likely to develop this condition:  Age. Most cases of male breast cancer occur in men who are in their 19s or 53s. The average age of diagnosis is 54.  Family history of breast cancer.  Having the BRCA1 and BRCA2 genes.  Having a history of radiation exposure.  Using medicines that contain estrogen.  Scarring of the liver (cirrhosis).  Obesity.  Having a genetic disorder called Klinefelter syndrome.  Certain problems that affect the testicles. What are the signs or symptoms? Symptoms of this condition include:  A painless lump in the breast.  Breast skin changes, such as puckering or dimpling.  Nipple abnormalities, such as scaling, crustiness, redness, or pulling in (retraction).  Nipple discharge that is bloody or clear. How is this diagnosed? This condition may be diagnosed by:  Medical history.  A physical exam. This will involve feeling the tissue around your breast and under your arms.  Taking a sample of any nipple discharge. The sample will be examined under a microscope.  Imaging tests, such as breast X-rays (mammogram), breast ultrasound exams, or an MRI.  Taking a tissue sample (biopsy) from the breast. The sample will be examined under a microscope to look for cancer cells.  Taking a sample from the lymph nodes near the affected breast (sentinel node biopsy). Your cancer will be staged to determine how severe and advanced it is (severity and extent). Staging is a careful attempt to find out the size of the tumor, whether the cancer has spread (metastasized), and if  so, to what parts of the body. You may need to have more tests to determine the stage of your cancer. How is this treated? Depending on the type and stage, male breast cancer may be treated with one or more of the following therapies:  Surgery. The goal is to remove as much of the cancer as possible. This is done with a procedure to remove the whole breast (mastectomy) and nipple. Some normal tissue surrounding this area may also be removed. This surgery may also involve removing lymph nodes in the area to be checked for cancer cells.  Radiation therapy, which uses high-energy rays to kill cancer cells.  Chemotherapy, which is the use of medicines to kill cancer cells.  Hormone therapy, which involves taking medicine to adjust the hormone levels in your body. You may take medicine to decrease your estrogen levels. This can help stop cancer cells from growing.  Targeted therapy, in which medicines are used to block the growth and the spread of cancer cells. These medicines target a specific part of the cancer cell and usually cause fewer side effects than chemotherapy. They may be used alone or in combination with chemotherapy. Follow these instructions at home:   Take over-the-counter and prescription medicines only as told by your health care provider.  Eat a healthy diet. A healthy diet includes lots of fruits and vegetables, low-fat dairy products, lean meats, and fiber. ? Make sure half your plate is filled with fruits or vegetables. ? Choose high-fiber foods such as whole-grain breads and cereals.  Consider joining a support group. This may  help you learn to cope with the stress of having breast cancer.  Talk to your health care team about exercise and physical activity. The right exercise program can: ? Help prevent or reduce symptoms such as fatigue or depression. ? Improve overall health and survival rates.  Keep all follow-up visits as told by your health care provider. This is  important. Where to find more information  American Cancer Society: www.cancer.[place]: www.cancer.gov Contact a health care provider if:  You have a sudden increase in pain.  You have any symptoms or changes that concern you.  You notice a new lump in either breast or under your arm.  You develop swelling in either arm or hand.  You lose weight without trying.  You have a fever.  You notice new fatigue or weakness. Get help right away if:  You have chest pain or trouble breathing.  You faint. Summary  Breast cancer is a cancerous growth of tissue (malignant tumor) in the breast.  The cause of this condition is not known, but age, the presence of a cancer gene, family history of breast cancer, and history of exposure to radiation increase the risk of getting breast cancer.  Your cancer will be staged to determine how severe and advanced it is (severity and extent).  Treatment for this condition depends on the type and stage of the breast cancer. This information is not intended to replace advice given to you by your health care provider. Make sure you discuss any questions you have with your health care provider. Document Released: 02/23/2008 Document Revised: 12/26/2016 Document Reviewed: 12/26/2016 Elsevier Interactive Patient Education  2019 [employer].

## 2018-08-04 NOTE — Progress Notes (Signed)
Subjective:    Patient ID: Jesus Bush, male    DOB: [birth year], 73 y.o.   MRN: [id number]  HPI 73 y.o. WM presents with knot on left breast.  Felt it 3 weeks ago but it was not hurting so he ignored it. Then he states he was playing kick ball yesterday in [place] and it was very sore with this, states it has improved but he still noticed it. Feels it has not gotten larger, states it is the size of your thumb. No redness, warmth at site. No fever, chills.  No night sweats, no weight loss.  No family history of cancer, prostate or breast.   No history of radiation exposure.   Wt Readings from Last 8 Encounters:  08/04/18 153 lb 1.6 oz (69.4 kg)  07/18/18 154 lb (69.9 kg)  06/27/18 151 lb 6.4 oz (68.7 kg)  05/23/18 155 lb (70.3 kg)  05/16/18 153 lb 9.6 oz (69.7 kg)  03/25/18 156 lb (70.8 kg)  12/17/17 149 lb 6.4 oz (67.8 kg)  10/08/17 154 lb (69.9 kg)    Blood pressure 122/80, pulse 68, temperature (!) 97.2 F (36.2 C), height 5' 9.5" (1.765 m), weight 153 lb 1.6 oz (69.4 kg), SpO2 95 %.  Medications Current Outpatient Medications on File Prior to Visit  Medication Sig  . ACCU-CHEK SOFTCLIX LANCETS lancets Check blood sugar 1 time daily-DX-R73.09  . albuterol (VENTOLIN HFA) 108 (90 Base) MCG/ACT inhaler 2 inhalations 15 minutes apart every 4 hours to Rescue Asthma  . Alcohol Swabs (B-D SINGLE USE SWABS REGULAR) PADS Check blood sugar 1 time daily-DX-R73.09.  Marland Kitchen aspirin 81 MG tablet Take 81 mg by mouth daily.  . baclofen (LIORESAL) 10 MG tablet TAKE 1/2 TO 1 TABLET TWICE DAILY ONLY AS NEEDED FOR MUSCLE SPASM(S)  . Blood Glucose Calibration (ACCU-CHEK AVIVA) SOLN Use per box instruction.  . Blood Glucose Monitoring Suppl (ACCU-CHEK AVIVA PLUS) w/Device KIT Check blood sugar 1 time daily-DX-R73.09  . Cholecalciferol (VITAMIN D) 2000 UNITS CAPS Take 1 capsule by mouth daily.  Marland Kitchen DILT-XR 240 MG 24 hr capsule TAKE 1 CAPSULE DAILY FOR BLOOD PRESSURE AND HEART BEAT  . finasteride (PROSCAR)  5 MG tablet TAKE 1 TABLET DAILY FOR PROSTATE  . glucose blood (ACCU-CHEK AVIVA PLUS) test strip Check blood sugar 1 time daily-R73.09.  . losartan (COZAAR) 100 MG tablet Take 1 tablet daily for BP  . magnesium gluconate (MAGONATE) 500 MG tablet Take 500 mg by mouth daily.   . methocarbamol (ROBAXIN-750) 750 MG tablet Take 1 tablet (750 mg total) by mouth every 8 (eight) hours as needed for muscle spasms.  . montelukast (SINGULAIR) 10 MG tablet Take 1 tablet (10 mg total) by mouth daily.  . ondansetron (ZOFRAN ODT) 8 MG disintegrating tablet Dissolve 1 tablet under tongue every 6 to 8 hours if needed for Nau  . pantoprazole (PROTONIX) 40 MG tablet TAKE 1 TABLET EVERY DAY  . rosuvastatin (CRESTOR) 40 MG tablet Take 1 tablet (40 mg total) by mouth daily.   No current facility-administered medications on file prior to visit.     Problem list He has Abnormal glucose (prediabetes); Essential hypertension; Mixed hyperlipidemia; Vitamin D deficiency; Medication management; Tobacco use disorder; Esophageal reflux; COPD (chronic obstructive pulmonary disease) ([place]); Aortic atherosclerosis ([place]); and Hyperthyroidism on their problem list.  Review of Systems     Objective:   Physical Exam Constitutional:      General: He is not in acute distress.    Appearance: He is not  ill-appearing.  HENT:     Head: Normocephalic and atraumatic.     Right Ear: Tympanic membrane, ear canal and external ear normal.     Left Ear: Tympanic membrane, ear canal and external ear normal.     Nose: Nose normal.     Mouth/Throat:     Mouth: Mucous membranes are dry.     Pharynx: No oropharyngeal exudate or posterior oropharyngeal erythema.  Neck:     Musculoskeletal: Normal range of motion. No muscular tenderness.  Chest:    Lymphadenopathy:     Cervical: No cervical adenopathy.     Upper Body:     Right upper body: No supraclavicular, axillary, pectoral or epitrochlear adenopathy.     Left upper body: No  supraclavicular, axillary, pectoral or epitrochlear adenopathy.  Neurological:     Mental Status: He is alert.        Assessment & Plan:  Jesus Bush was seen today for acute visit.  Diagnoses and all orders for this visit:  Left breast mass -     MM DIAG BREAST TOMO UNI LEFT; Future -     US BREAST LTD UNI LEFT INC AXILLA; Future   Do heating pad/heat in the mean time Will get diagnostic MGM and [place]  Future Appointments  Date Time Provider [place]  10/13/2018  3:00 PM Unk Pinto, MD [facility] None  07/22/2019  9:00 AM Liane Comber, NP [facility] None

## 2018-08-08 ENCOUNTER — Other Ambulatory Visit: Payer: Medicare HMO

## 2018-08-21 ENCOUNTER — Other Ambulatory Visit: Payer: Self-pay

## 2018-08-21 ENCOUNTER — Ambulatory Visit
Admission: RE | Admit: 2018-08-21 | Discharge: 2018-08-21 | Disposition: A | Discharge status: Home / Self Care | Payer: Medicare HMO | Source: Ambulatory Visit | Attending: Physician Assistant | Admitting: Physician Assistant

## 2018-08-21 ENCOUNTER — Ambulatory Visit: Payer: Medicare HMO

## 2018-08-21 DIAGNOSIS — N6320 Unspecified lump in the left breast, unspecified quadrant: Secondary | ICD-10-CM

## 2018-08-21 DIAGNOSIS — R928 Other abnormal and inconclusive findings on diagnostic imaging of breast: Secondary | ICD-10-CM | POA: Diagnosis not present

## 2018-10-12 ENCOUNTER — Encounter: Payer: Self-pay | Admitting: Internal Medicine

## 2018-10-12 NOTE — Patient Instructions (Signed)

## 2018-10-12 NOTE — Progress Notes (Signed)
[facility]  Unk Pinto, M.D.        Uvaldo Bristle. Silverio Lay, P.A.-C         Liane Comber, [place]                [address] [place], [place] [address] Telephone [phone] Telefax [phone] Annual  Screening/Preventative Visit  & Comprehensive Evaluation & Examination     This very nice 73 y.o. MWM presents for a Screening /Preventative Visit & comprehensive evaluation and management of multiple medical co-morbidities.  Patient has been followed for HTN, HLD, T2_NIDDM and Vitamin D Deficiency. Patient has GERD controlled on his meds.      Due to his  long history of smoking over 35+  pk years,  I discussed lung cancer screening with him. He was agreeable to undergo a screening low dose CT scan of the chest.  We discussed smoking cessation techniques/options. I will refer him for a LDCT lung scan & lung cancer screening program.     HTN predates since 2005. Patient's BP has been controlled at home.  Today's BP is at goal - 102/54. Patient denies any cardiac symptoms as chest pain, palpitations, shortness of breath, dizziness or ankle swelling.     Patient's hyperlipidemia is controlled with diet and  Rosuvastatin. Patient denies myalgias or other medication SE's. Last lipids off of Rosuvastatin were not at goal: Lab Results  Component Value Date   CHOL 240 (H) 06/27/2018   HDL 35 (L) 06/27/2018   LDLCALC 165 (H) 06/27/2018   TRIG 234 (H) 06/27/2018   CHOLHDL 6.9 (H) 06/27/2018      Patient has hx/o T2_NIDDM (A1c 7.5% / 2010). Patient had been on Metformin in the past, but self d/c'd Aug 2018 and has controlled with diet.  And He denies reactive hypoglycemic symptoms, visual blurring, diabetic polys or paresthesias. Last A1c was not at goal: Lab Results  Component Value Date   HGBA1C 6.3 (H) 06/27/2018       Finally, patient has history of Vitamin D Deficiency ("23" / 2008) and last  vitamin D was near goal (70-100): Lab Results  Component Value Date   VD25OH 56 12/17/2017   Current Outpatient Medications on File Prior to Visit  Medication Sig  . ACCU-CHEK SOFTCLIX LANCETS lancets Check blood sugar 1 time daily-DX-R73.09  . albuterol (VENTOLIN HFA) 108 (90 Base) MCG/ACT inhaler 2 inhalations 15 minutes apart every 4 hours to Rescue Asthma  . Alcohol Swabs (B-D SINGLE USE SWABS REGULAR) PADS Check blood sugar 1 time daily-DX-R73.09.  Marland Kitchen aspirin 81 MG tablet Take 81 mg by mouth daily.  . baclofen (LIORESAL) 10 MG tablet TAKE 1/2 TO 1 TABLET TWICE DAILY ONLY AS NEEDED FOR MUSCLE SPASM(S)  . Blood Glucose Calibration (ACCU-CHEK AVIVA) SOLN Use per box instruction.  . Blood Glucose Monitoring Suppl (ACCU-CHEK AVIVA PLUS) w/Device KIT Check blood sugar 1 time daily-DX-R73.09  . Cholecalciferol (VITAMIN D) 2000 UNITS CAPS Take 1 capsule by mouth daily.  Marland Kitchen DILT-XR 240 MG 24 hr capsule TAKE 1 CAPSULE DAILY FOR BLOOD PRESSURE AND HEART BEAT  . finasteride (PROSCAR) 5 MG tablet TAKE 1 TABLET DAILY FOR PROSTATE  . glucose blood (ACCU-CHEK AVIVA PLUS) test strip Check blood sugar 1 time daily-R73.09.  . magnesium gluconate (MAGONATE) 500 MG tablet Take 500 mg by mouth daily.   . ondansetron (ZOFRAN ODT) 8  MG disintegrating tablet Dissolve 1 tablet under tongue every 6 to 8 hours if needed for Nau  . pantoprazole (PROTONIX) 40 MG tablet TAKE 1 TABLET EVERY DAY  . rosuvastatin (CRESTOR) 40 MG tablet Take 1 tablet (40 mg total) by mouth daily.   No current facility-administered medications on file prior to visit.    Allergies  Allergen Reactions  . Ppd [Tuberculin Purified Protein Derivative]     Positive PPD.   Past Medical History:  Diagnosis Date  . Diabetes mellitus without complication ([place])   . Hyperlipidemia   . Hypertension   . Vitamin D deficiency    Health Maintenance  Topic Date Due  . Hepatitis C Screening  01/25/2020 (Originally [birth year])  . INFLUENZA  VACCINE  10/25/2018  . Fecal DNA (Cologuard)  09/18/2020  . TETANUS/TDAP  08/07/2026  . PNA vac Low Risk Adult  Completed   Immunization History  Administered Date(s) Administered  . DTaP 05/23/2006  . Influenza Whole 12/16/2012  . Influenza, High Dose Seasonal PF 01/17/2015, 12/13/2015, 01/29/2017, 12/17/2017  . Influenza-Unspecified 12/27/2013  . Pneumococcal Conjugate-13 04/13/2014  . Pneumococcal Polysaccharide-23 09/13/2008, 08/06/2016  . Td 08/06/2016  . Zoster 04/17/2013   Last Colon - 08/15/2007 - Dr Deatra Ina - recc 10 yr f/u - overdue  - Sent recall letter 07/17/2017 - DrDanis  Past Surgical History:  Procedure Laterality Date  . CATARACT EXTRACTION, BILATERAL Bilateral 2018   Dr. Coral Ceo   Family History  Problem Relation Age of Onset  . Heart attack Mother   . Heart disease Mother   . Heart attack Brother   . Hypertension Brother   . Diabetes Sister   . Heart disease Sister   . Heart disease Father   . Heart attack Father    Social History   Socioeconomic History  . Marital status: Married    Spouse name: Not on file  . Number of children: 2  . Years of education: Not on file  . Highest education level: Not on file  Occupational History  . Occupation: retired  Tobacco Use  . Smoking status: Current Every Day Smoker    Packs/day: 1.00    Years: 25.00    Pack years: 25.00    Types: Cigarettes  . Smokeless tobacco: Never Used  . Tobacco comment: smokes 4-5 cigarettes a week and uses E-cigarette  Substance and Sexual Activity  . Alcohol use: No    Alcohol/week: 0.0 standard drinks    Comment: He states he no longer drinks alcohol  . Drug use: No  . Sexual activity: Active    ROS Constitutional: Denies fever, chills, weight loss/gain, headaches, insomnia,  night sweats or change in appetite. Does c/o fatigue. Eyes: Denies redness, blurred vision, diplopia, discharge, itchy or watery eyes.  ENT: Denies discharge, congestion, post nasal drip,  epistaxis, sore throat, earache, hearing loss, dental pain, Tinnitus, Vertigo, Sinus pain or snoring.  Cardio: Denies chest pain, palpitations, irregular heartbeat, syncope, dyspnea, diaphoresis, orthopnea, PND, claudication or edema Respiratory: denies cough, dyspnea, DOE, pleurisy, hoarseness, laryngitis or wheezing.  Gastrointestinal: Denies dysphagia, heartburn, reflux, water brash, pain, cramps, nausea, vomiting, bloating, diarrhea, constipation, hematemesis, melena, hematochezia, jaundice or hemorrhoids Genitourinary: Denies dysuria, frequency,  discharge, hematuria or flank pain. Has urgency, nocturia x 2-3 & occasional hesitancy. Musculoskeletal: Denies arthralgia, myalgia, stiffness, Jt. Swelling, pain, limp or strain/sprain. Denies Falls. Skin: Denies puritis, rash, hives, warts, acne, eczema or change in skin lesion Neuro: No weakness, tremor, incoordination, spasms, paresthesia or pain Psychiatric: Denies confusion, memory loss  or sensory loss. Denies Depression. Endocrine: Denies change in weight, skin, hair change, nocturia, and paresthesia, diabetic polys, visual blurring or hyper / hypo glycemic episodes.  Heme/Lymph: No excessive bleeding, bruising or enlarged lymph nodes.  Physical Exam  BP (!) 102/54   Pulse 72   Temp (!) 97.2 F (36.2 C)   Resp 16   Ht 5' 9.5" (1.765 m)   Wt 150 lb 3.2 oz (68.1 kg)   BMI 21.86 kg/m   General Appearance: Well nourished and well groomed and in no apparent distress.  Eyes: PERRLA, EOMs, conjunctiva no swelling or erythema, normal fundi and vessels. Sinuses: No frontal/maxillary tenderness ENT/Mouth: EACs patent / TMs  nl. Nares clear without erythema, swelling, mucoid exudates. Oral hygiene is good. No erythema, swelling, or exudate. Tongue normal, non-obstructing. Tonsils not swollen or erythematous. Hearing normal.  Neck: Supple, thyroid not palpable. No bruits, nodes or JVD. Respiratory: Respiratory effort normal.  BS equal and clear  bilateral without rales, rhonci, wheezing or stridor. Cardio: Heart sounds are normal with regular rate and rhythm and no murmurs, rubs or gallops. Peripheral pulses are normal and equal bilaterally without edema. No aortic or femoral bruits. Chest: symmetric with normal excursions and percussion.  Abdomen: Soft, with Nl bowel sounds. Nontender, no guarding, rebound, hernias, masses, or organomegaly.  Lymphatics: Non tender without lymphadenopathy.  Musculoskeletal: Full ROM all peripheral extremities, joint stability, 5/5 strength, and normal gait. Skin: Warm and dry without rashes, lesions, cyanosis, clubbing or  ecchymosis.  Neuro: Cranial nerves intact, reflexes equal bilaterally. Normal muscle tone, no cerebellar symptoms. Sensation intact to touch, vibratory and Monofilament to the toes bilaterally.  Pysch: Alert and oriented X 3 with normal affect, insight and judgment appropriate.   Assessment and Plan  1. Annual Preventative/Screening Exam   2. Essential hypertension  - EKG 12-Lead - [place], RETROPERITNL ABD,  LTD - Urinalysis, Routine w reflex microscopic - Microalbumin / creatinine urine ratio - CBC with Differential/Platelet - COMPLETE METABOLIC PANEL WITH GFR - Magnesium - TSH  3. Hyperlipidemia, mixed  - EKG 12-Lead - [place], RETROPERITNL ABD,  LTD - Lipid panel - TSH  4. Diabetes mellitus without complication ([place])  - EKG 12-Lead - [place], RETROPERITNL ABD,  LTD - Urinalysis, Routine w reflex microscopic - Microalbumin / creatinine urine ratio - HM DIABETES FOOT EXAM - LOW EXTREMITY NEUR EXAM DOCUM - Hemoglobin A1c - Insulin, random  5. Vitamin D deficiency  - VITAMIN D 25 Hydroxyl  6. BPH with urinary obstruction  - PSA  7. Chronic obstructive pulmonary disease,  ([place])   8. Prostate cancer screening  - PSA  9. Screening for ischemic heart disease  - EKG 12-Lead - Lipid panel  10. FHx: heart disease  - EKG 12-Lead - [place], RETROPERITNL ABD,   LTD  11. Smoker  - EKG 12-Lead - [place], RETROPERITNL ABD,  LTD  12. Screening for AAA (aortic abdominal aneurysm)  - [place], RETROPERITNL ABD,  LTD  13. Aortic atherosclerosis (HCC)  - [place], RETROPERITNL ABD,  LTD  14. Medication management  - Urinalysis, Routine w reflex microscopic - Microalbumin / creatinine urine ratio - CBC with Differential/Platelet - COMPLETE METABOLIC PANEL WITH GFR - Magnesium - Lipid panel - TSH - Hemoglobin A1c - Insulin, random - VITAMIN D 25 Hydroxyl        Patient was counseled in prudent diet, weight control to achieve/maintain BMI less than 25, BP monitoring, regular exercise and medications as discussed.  Discussed med effects and SE's. Routine screening  labs and tests as requested with regular follow-up as recommended. Over 40 minutes of exam, counseling, chart review and high complex critical decision making was performed   Kirtland Bouchard, MD

## 2018-10-13 ENCOUNTER — Ambulatory Visit (INDEPENDENT_AMBULATORY_CARE_PROVIDER_SITE_OTHER): Payer: Medicare HMO | Admitting: Internal Medicine

## 2018-10-13 ENCOUNTER — Other Ambulatory Visit: Payer: Self-pay | Admitting: Internal Medicine

## 2018-10-13 ENCOUNTER — Other Ambulatory Visit: Payer: Self-pay | Admitting: *Deleted

## 2018-10-13 ENCOUNTER — Encounter: Payer: Self-pay | Admitting: Internal Medicine

## 2018-10-13 ENCOUNTER — Other Ambulatory Visit: Payer: Self-pay

## 2018-10-13 VITALS — BP 102/54 | HR 72 | Temp 97.2°F | Resp 16 | Ht 69.5 in | Wt 150.2 lb

## 2018-10-13 DIAGNOSIS — E119 Type 2 diabetes mellitus without complications: Secondary | ICD-10-CM | POA: Diagnosis not present

## 2018-10-13 DIAGNOSIS — Z8249 Family history of ischemic heart disease and other diseases of the circulatory system: Secondary | ICD-10-CM | POA: Diagnosis not present

## 2018-10-13 DIAGNOSIS — Z125 Encounter for screening for malignant neoplasm of prostate: Secondary | ICD-10-CM

## 2018-10-13 DIAGNOSIS — Z136 Encounter for screening for cardiovascular disorders: Secondary | ICD-10-CM | POA: Diagnosis not present

## 2018-10-13 DIAGNOSIS — E782 Mixed hyperlipidemia: Secondary | ICD-10-CM | POA: Diagnosis not present

## 2018-10-13 DIAGNOSIS — I700 Atherosclerosis of aorta: Secondary | ICD-10-CM

## 2018-10-13 DIAGNOSIS — N401 Enlarged prostate with lower urinary tract symptoms: Secondary | ICD-10-CM

## 2018-10-13 DIAGNOSIS — I10 Essential (primary) hypertension: Secondary | ICD-10-CM | POA: Diagnosis not present

## 2018-10-13 DIAGNOSIS — F17200 Nicotine dependence, unspecified, uncomplicated: Secondary | ICD-10-CM

## 2018-10-13 DIAGNOSIS — E559 Vitamin D deficiency, unspecified: Secondary | ICD-10-CM | POA: Diagnosis not present

## 2018-10-13 DIAGNOSIS — J449 Chronic obstructive pulmonary disease, unspecified: Secondary | ICD-10-CM

## 2018-10-13 DIAGNOSIS — Z0000 Encounter for general adult medical examination without abnormal findings: Secondary | ICD-10-CM | POA: Diagnosis not present

## 2018-10-13 DIAGNOSIS — Z79899 Other long term (current) drug therapy: Secondary | ICD-10-CM | POA: Diagnosis not present

## 2018-10-13 DIAGNOSIS — R7309 Other abnormal glucose: Secondary | ICD-10-CM

## 2018-10-13 DIAGNOSIS — Z0001 Encounter for general adult medical examination with abnormal findings: Secondary | ICD-10-CM

## 2018-10-13 DIAGNOSIS — N138 Other obstructive and reflux uropathy: Secondary | ICD-10-CM

## 2018-10-13 MED ORDER — LOSARTAN POTASSIUM 100 MG PO TABS: ORAL_TABLET | ORAL | 1 refills | Status: DC

## 2018-10-13 MED ORDER — MONTELUKAST SODIUM 10 MG PO TABS: 10.0000 mg | ORAL_TABLET | Freq: Every day | ORAL | 3 refills | Status: DC

## 2018-10-14 LAB — CBC WITH DIFFERENTIAL/PLATELET
Absolute Monocytes: 631 cells/uL (ref 200–950)
Basophils Absolute: 98 cells/uL (ref 0–200)
Basophils Relative: 1.2 %
Eosinophils Absolute: 139 cells/uL (ref 15–500)
Eosinophils Relative: 1.7 %
HCT: 40.6 % (ref 38.5–50.0)
Hemoglobin: 13.8 g/dL (ref 13.2–17.1)
Lymphs Abs: 1730 cells/uL (ref 850–3900)
MCH: 32.4 pg (ref 27.0–33.0)
MCHC: 34 g/dL (ref 32.0–36.0)
MCV: 95.3 fL (ref 80.0–100.0)
MPV: 10.4 fL (ref 7.5–12.5)
Monocytes Relative: 7.7 %
Neutro Abs: 5601 cells/uL (ref 1500–7800)
Neutrophils Relative %: 68.3 %
Platelets: 337 10*3/uL (ref 140–400)
RBC: 4.26 10*6/uL (ref 4.20–5.80)
RDW: 12.8 % (ref 11.0–15.0)
Total Lymphocyte: 21.1 %
WBC: 8.2 10*3/uL (ref 3.8–10.8)

## 2018-10-14 LAB — URINALYSIS, ROUTINE W REFLEX MICROSCOPIC
Bilirubin Urine: NEGATIVE
Glucose, UA: NEGATIVE
Hgb urine dipstick: NEGATIVE
Ketones, ur: NEGATIVE
Leukocytes,Ua: NEGATIVE
Nitrite: NEGATIVE
Protein, ur: NEGATIVE
Specific Gravity, Urine: 1.021 (ref 1.001–1.03)
pH: 7.5 (ref 5.0–8.0)

## 2018-10-14 LAB — MICROALBUMIN / CREATININE URINE RATIO
Creatinine, Urine: 187 mg/dL (ref 20–320)
Microalb Creat Ratio: 2 ug/mg{creat}
Microalb, Ur: 0.4 mg/dL

## 2018-10-14 LAB — MAGNESIUM: Magnesium: 1.9 mg/dL (ref 1.5–2.5)

## 2018-10-14 LAB — LIPID PANEL
Cholesterol: 160 mg/dL (ref <200)
HDL: 37 mg/dL — ABNORMAL LOW (ref >=40)
LDL Cholesterol (Calc): 89 mg/dL (calc)
Non-HDL Cholesterol (Calc): 123 mg/dL (calc) (ref <130)
Total CHOL/HDL Ratio: 4.3 (calc) (ref <5.0)
Triglycerides: 245 mg/dL — ABNORMAL HIGH (ref <150)

## 2018-10-14 LAB — COMPLETE METABOLIC PANEL WITH GFR
AG Ratio: 1.6 (calc) (ref 1.0–2.5)
ALT: 13 U/L (ref 9–46)
AST: 14 U/L (ref 10–35)
Albumin: 4.3 g/dL (ref 3.6–5.1)
Alkaline phosphatase (APISO): 83 U/L (ref 35–144)
BUN: 14 mg/dL (ref 7–25)
CO2: 29 mmol/L (ref 20–32)
Calcium: 10.1 mg/dL (ref 8.6–10.3)
Chloride: 103 mmol/L (ref 98–110)
Creat: 1.11 mg/dL (ref 0.70–1.18)
GFR, Est African American: 76 mL/min/{1.73_m2} (ref >=60)
GFR, Est Non African American: 66 mL/min/{1.73_m2} (ref >=60)
Globulin: 2.7 g/dL (calc) (ref 1.9–3.7)
Glucose, Bld: 90 mg/dL (ref 65–99)
Potassium: 4.5 mmol/L (ref 3.5–5.3)
Sodium: 140 mmol/L (ref 135–146)
Total Bilirubin: 0.4 mg/dL (ref 0.2–1.2)
Total Protein: 7 g/dL (ref 6.1–8.1)

## 2018-10-14 LAB — HEMOGLOBIN A1C
Hgb A1c MFr Bld: 6.1 % of total Hgb — ABNORMAL HIGH (ref <5.7)
Mean Plasma Glucose: 128 (calc)
eAG (mmol/L): 7.1 (calc)

## 2018-10-14 LAB — TSH: TSH: 0.46 mIU/L (ref 0.40–4.50)

## 2018-10-14 LAB — VITAMIN D 25 HYDROXY (VIT D DEFICIENCY, FRACTURES): Vit D, 25-Hydroxy: 63 ng/mL (ref 30–100)

## 2018-10-14 LAB — INSULIN, RANDOM: Insulin: 4.3 u[IU]/mL

## 2018-10-22 ENCOUNTER — Other Ambulatory Visit: Payer: Self-pay | Admitting: Adult Health

## 2018-12-30 ENCOUNTER — Other Ambulatory Visit: Payer: Self-pay | Admitting: Adult Health

## 2019-01-13 NOTE — Progress Notes (Signed)
FOLLOW UP  Assessment and Plan:   Atherosclerosis of aorta By imaging;  Advised he stop smoking Control blood pressure, cholesterol, glucose, increase exercise.   COPD Advised to stop smoking, continue annual imaging, currently fairly controlled by PRN albuterol and trigger control (allergy meds) Declines daily inhaler, asymptomatic since decreasing cigarette use  Hypertension Well controlled with current medications  Monitor blood pressure at home; patient to call if consistently greater than 130/80 Continue DASH diet.   Reminder to go to the ER if any CP, SOB, nausea, dizziness, severe HA, changes vision/speech, left arm numbness and tingling and jaw pain.  Cholesterol Currently at goal by statin; consider switch to rosuvastatin 40 mg if LDL trending up Continue low cholesterol diet and exercise.  Check lipid panel.   Prediabetes Continue diet and exercise.  Perform daily foot/skin check, notify office of any concerning changes.  Check A1C q49m monitor weight, serum glucose  Acid reflux Well managed on current medications, has PPI but using PRN rarely Discussed diet, avoiding triggers and other lifestyle changes  Normal weight (BMI 22 ) Continue to recommend diet heavy in fruits and veggies and low in animal meats, cheeses, and dairy products, appropriate calorie intake Discuss exercise recommendations routinely Continue to monitor weight at each visit  Vitamin D Def At goal at last visit; continue supplementation for goal of 60-100 Defer Vit D level  Tobacco use (30+ pack year history) Discussed risks associated with tobacco use and advised to reduce or quit Patient is ready to do so, declines medications, is slowly tapering Will follow up at the next visit -lung cancer screening with low dose CT discussed as recommended by guidelines based on age, number of pack year history.  Discussed risks of screening including but not limited to false positives on xray,  further testing or consultation with specialist, and possible false negative CT as well. Understanding expressed and wishes to proceed with CT testing. Order placed.    Continue diet and meds as discussed. Further disposition pending results of labs. Discussed med's effects and SE's.   Over 30 minutes of exam, counseling, chart review, and critical decision making was performed.   Future Appointments  Date Time Provider DTroup 04/17/2019 10:30 AM MUnk Pinto MD [facility] None  07/22/2019  9:00 AM CLiane Comber NP [facility] None  11/09/2019  3:00 PM MUnk Pinto MD [facility] None    ----------------------------------------------------------------------------------------------------------------------  HPI 73y.o. male  presents for 3 month follow up on hypertension, cholesterol, prediabetes, weight and vitamin D deficiency.   He has COPD, is a smoker, interested in quitting, tried wellbutrin and chantix didn't help, smokes 0-4 cigs a day, plans to continue to taper. Down from 1.5 pack/day. He is trying to chew gum when stressed to cut back. Had CXR last in 08/2017. He has never had screening low dose lung CT. Denies any dyspnea, cough.   he has a diagnosis of GERD which is currently managed by lifestyle modification and protonix 40 mg, uses PRN, recently rare.  he reports symptoms are currently well controlled, and denies breakthrough reflux, burning in chest, hoarseness or cough.    BMI is Body mass index is 22.15 kg/m., he has been working on diet and exercise. He admits he is not eating as much due to not being able to go to restaurants due to covid 19.  Wt Readings from Last 3 Encounters:  01/14/19 152 lb 3.2 oz (69 kg)  10/13/18 150 lb 3.2 oz (68.1 kg)  08/04/18 153 lb  1.6 oz (69.4 kg)   He has aortic atherosclerosis by imaging.  His blood pressure has been controlled at home, today their BP is BP: 110/60  He does workout. He denies chest pain, shortness of  breath, dizziness.   He is on cholesterol medication (atorvastatin 40 mg daily) and denies myalgias. His cholesterol is at goal. The cholesterol last visit was:   Lab Results  Component Value Date   CHOL 160 10/13/2018   HDL 37 (L) 10/13/2018   LDLCALC 89 10/13/2018   TRIG 245 (H) 10/13/2018   CHOLHDL 4.3 10/13/2018    He has been working on diet and exercise for prediabetes, and denies foot ulcerations, hyperglycemia, hypoglycemia , increased appetite, nausea, paresthesia of the feet, polydipsia, polyuria, visual disturbances, vomiting and weight loss. He checks a fasting sugar occasionally, runs 98-124. Last A1C in the office was:  Lab Results  Component Value Date   HGBA1C 6.1 (H) 10/13/2018   Lab Results  Component Value Date   GFRNONAA 66 10/13/2018   Patient is on Vitamin D supplement and at goal of 60:    Lab Results  Component Value Date   VD25OH 63 10/13/2018    Current Medications:  Current Outpatient Medications on File Prior to Visit  Medication Sig  . ACCU-CHEK SOFTCLIX LANCETS lancets Check blood sugar 1 time daily-DX-R73.09  . albuterol (VENTOLIN HFA) 108 (90 Base) MCG/ACT inhaler 2 inhalations 15 minutes apart every 4 hours to Rescue Asthma  . Alcohol Swabs (B-D SINGLE USE SWABS REGULAR) PADS Check blood sugar 1 time daily-DX-R73.09.  Marland Kitchen aspirin 81 MG tablet Take 81 mg by mouth daily.  . baclofen (LIORESAL) 10 MG tablet TAKE 1/2 TO 1 TABLET TWICE DAILY ONLY AS NEEDED FOR MUSCLE SPASM(S)  . Blood Glucose Calibration (ACCU-CHEK AVIVA) SOLN Use per box instruction.  . Blood Glucose Monitoring Suppl (ACCU-CHEK AVIVA PLUS) w/Device KIT Check blood sugar 1 time daily-DX-R73.09  . Cholecalciferol (VITAMIN D) 2000 UNITS CAPS Take 1 capsule by mouth daily.  Marland Kitchen DILT-XR 240 MG 24 hr capsule TAKE 1 CAPSULE DAILY FOR BLOOD PRESSURE AND HEART BEAT  . finasteride (PROSCAR) 5 MG tablet TAKE 1 TABLET DAILY FOR PROSTATE  . glucose blood (ACCU-CHEK AVIVA PLUS) test strip Check blood  sugar 1 time daily-R73.09.  . losartan (COZAAR) 100 MG tablet Take 1 tablet daily for BP  . magnesium gluconate (MAGONATE) 500 MG tablet Take 500 mg by mouth daily.   . montelukast (SINGULAIR) 10 MG tablet Take 1 tablet (10 mg total) by mouth daily.  . pantoprazole (PROTONIX) 40 MG tablet TAKE 1 TABLET EVERY DAY  . rosuvastatin (CRESTOR) 40 MG tablet Take 1 tablet Daily for Cholesterol  . ondansetron (ZOFRAN ODT) 8 MG disintegrating tablet Dissolve 1 tablet under tongue every 6 to 8 hours if needed for Nau (Patient not taking: Reported on 01/14/2019)   No current facility-administered medications on file prior to visit.      Allergies:  Allergies  Allergen Reactions  . Ppd [Tuberculin Purified Protein Derivative]     Positive PPD.     Medical History:  Past Medical History:  Diagnosis Date  . Diabetes mellitus without complication ([place])   . Hyperlipidemia   . Hypertension   . Vitamin D deficiency    Family history- Reviewed and unchanged Social history- Reviewed and unchanged   Review of Systems:  Review of Systems  Constitutional: Negative for malaise/fatigue and weight loss.  HENT: Negative for hearing loss and tinnitus.   Eyes: Negative for blurred  vision and double vision.  Respiratory: Negative for cough, shortness of breath and wheezing.   Cardiovascular: Negative for chest pain, palpitations, orthopnea, claudication and leg swelling.  Gastrointestinal: Negative for abdominal pain, blood in stool, constipation, diarrhea, heartburn, melena, nausea and vomiting.  Genitourinary: Negative.   Musculoskeletal: Negative for joint pain and myalgias.  Skin: Negative for rash.  Neurological: Negative for dizziness, tingling, sensory change, weakness and headaches.  Endo/Heme/Allergies: Positive for environmental allergies. Negative for polydipsia.  Psychiatric/Behavioral: Negative.   All other systems reviewed and are negative.   Physical Exam: BP 110/60   Pulse 65    Temp (!) 97.4 F (36.3 C)   Wt 152 lb 3.2 oz (69 kg)   SpO2 97%   BMI 22.15 kg/m  Wt Readings from Last 3 Encounters:  01/14/19 152 lb 3.2 oz (69 kg)  10/13/18 150 lb 3.2 oz (68.1 kg)  08/04/18 153 lb 1.6 oz (69.4 kg)   General Appearance: Well nourished, in no apparent distress. Eyes: PERRLA, EOMs, conjunctiva no swelling or erythema Sinuses: No Frontal/maxillary tenderness ENT/Mouth: Ext aud canals clear, TMs without erythema, bulging. No erythema, swelling, or exudate on post pharynx.  Tonsils not swollen or erythematous. Hearing normal.  Neck: Supple, thyroid normal.  Respiratory: Respiratory effort normal, BS equal bilaterally without rales, rhonchi, wheezing or stridor.  Cardio: RRR with no MRGs. Brisk peripheral pulses without edema.  Abdomen: Soft, + BS.  Non tender, no guarding, rebound, hernias, masses. Lymphatics: Non tender without lymphadenopathy.  Musculoskeletal: Full ROM, 5/5 strength, Normal gait Skin: Warm, dry without rashes, lesions, ecchymosis.  Neuro: Cranial nerves intact. No cerebellar symptoms.  Psych: Awake and oriented X 3, normal affect, Insight and Judgment appropriate.    Izora Ribas, NP 11:11 AM [facility]

## 2019-01-14 ENCOUNTER — Other Ambulatory Visit: Payer: Self-pay

## 2019-01-14 ENCOUNTER — Encounter: Payer: Self-pay | Admitting: Adult Health

## 2019-01-14 ENCOUNTER — Ambulatory Visit (INDEPENDENT_AMBULATORY_CARE_PROVIDER_SITE_OTHER): Payer: Medicare HMO | Admitting: Adult Health

## 2019-01-14 VITALS — BP 110/60 | HR 65 | Temp 97.4°F | Wt 152.2 lb

## 2019-01-14 DIAGNOSIS — Z6822 Body mass index (BMI) 22.0-22.9, adult: Secondary | ICD-10-CM

## 2019-01-14 DIAGNOSIS — K219 Gastro-esophageal reflux disease without esophagitis: Secondary | ICD-10-CM | POA: Diagnosis not present

## 2019-01-14 DIAGNOSIS — J449 Chronic obstructive pulmonary disease, unspecified: Secondary | ICD-10-CM

## 2019-01-14 DIAGNOSIS — I10 Essential (primary) hypertension: Secondary | ICD-10-CM | POA: Diagnosis not present

## 2019-01-14 DIAGNOSIS — E0590 Thyrotoxicosis, unspecified without thyrotoxic crisis or storm: Secondary | ICD-10-CM | POA: Diagnosis not present

## 2019-01-14 DIAGNOSIS — I700 Atherosclerosis of aorta: Secondary | ICD-10-CM

## 2019-01-14 DIAGNOSIS — E559 Vitamin D deficiency, unspecified: Secondary | ICD-10-CM

## 2019-01-14 DIAGNOSIS — F17200 Nicotine dependence, unspecified, uncomplicated: Secondary | ICD-10-CM

## 2019-01-14 DIAGNOSIS — Z23 Encounter for immunization: Secondary | ICD-10-CM

## 2019-01-14 DIAGNOSIS — Z79899 Other long term (current) drug therapy: Secondary | ICD-10-CM

## 2019-01-14 DIAGNOSIS — R7309 Other abnormal glucose: Secondary | ICD-10-CM | POA: Diagnosis not present

## 2019-01-14 DIAGNOSIS — Z122 Encounter for screening for malignant neoplasm of respiratory organs: Secondary | ICD-10-CM

## 2019-01-14 DIAGNOSIS — E782 Mixed hyperlipidemia: Secondary | ICD-10-CM

## 2019-01-14 NOTE — Patient Instructions (Signed)
Goals    . HEMOGLOBIN A1C < 5.7    . LDL CALC < 100    . Quit Smoking        SMOKING CESSATION  American cancer society  NX:2814358 for more information or for a free program for smoking cessation help.   You can call QUIT SMART 1-800-QUIT-NOW for free nicotine patches or replacement therapy- if they are out- keep calling   [facility] Can call for smoking cessation classes, [phone]  If you have a smart phone, please look up Smoke Free app, this will help you stay on track and give you information about money you have saved, life that you have gained back and a ton of more information.     ADVANTAGES OF QUITTING SMOKING  Within 20 minutes, blood pressure decreases. Your pulse is at normal level.  After 8 hours, carbon monoxide levels in the blood return to normal. Your oxygen level increases.  After 24 hours, the chance of having a heart attack starts to decrease. Your breath, hair, and body stop smelling like smoke.  After 48 hours, damaged nerve endings begin to recover. Your sense of taste and smell improve.  After 72 hours, the body is virtually free of nicotine. Your bronchial tubes relax and breathing becomes easier.  After 2 to 12 weeks, lungs can hold more air. Exercise becomes easier and circulation improves.  After 1 year, the risk of coronary heart disease is cut in half.  After 5 years, the risk of stroke falls to the same as a nonsmoker.  After 10 years, the risk of lung cancer is cut in half and the risk of other cancers decreases significantly.  After 15 years, the risk of coronary heart disease drops, usually to the level of a nonsmoker.  You will have extra money to spend on things other than cigarettes.      Lung Cancer Screening A lung cancer screening is a test that checks for lung cancer. Lung cancer screening is done to look for lung cancer in its very early stages, before it spreads and becomes harder to treat and before  symptoms appear. Finding cancer early improves the chances of successful treatment. It may save your life. Should I be screened for lung cancer? You should be screened for lung cancer if all of these apply:  You currently smoke or you have quit smoking within the past 15 years.  You are 59-15 years old. Screening may be recommended up to age 84 depending on your overall health and other factors.  You are in good general health.  You have a smoking history of 1 pack a day for 30 years or 2 packs a day for 15 years. Screening may also be recommended if you are at high risk for the disease. You may be at high risk if:  You have a family history of lung cancer.  You have been exposed to asbestos.  You have chronic obstructive pulmonary disease (COPD).  You have a history of previous lung cancer. How often should I be screened for lung cancer?  If you are at risk for lung cancer, it is recommended that you are screened once a year. The recommended screening test is a low-dose CT scan. How can I lower my risk of lung cancer? To lower your risk of developing lung cancer:  If you smoke, stop smoking all tobacco products.  Avoid secondhand smoke.  Avoid exposure to radiation.  Avoid exposure to radon gas. Have your home  checked for radon regularly.  Avoid things that cause cancer (carcinogens).  Avoid living or working in places with high air pollution. Where to find more information Ask your health care provider about the risks and benefits of screening. More information and resources are available from these organizations:  [place] (ACS): www.cancer.org  American Lung Association: www.lung.org Contact a health care provider if:  You start to show symptoms of lung cancer, including: ? Coughing that will not go away. ? Wheezing. ? Chest pain. ? Coughing up blood. ? Shortness of breath. ? Weight loss that cannot be explained. ? Constant fatigue. Summary   Lung cancer screening may find lung cancer before symptoms appear. Finding cancer early improves the chances of successful treatment. It may save your life.  If you are at risk for lung cancer, it is recommended that you are screened once a year. The recommended screening test is a low-dose CT scan.  You can make lifestyle changes to lower your risk of lung cancer.  Ask your health care provider about the risks and benefits of screening. This information is not intended to replace advice given to you by your health care provider. Make sure you discuss any questions you have with your health care provider. Document Released: 02/01/2016 Document Revised: 07/04/2018 Document Reviewed: 02/01/2016 Elsevier Patient Education  2020 [employer].

## 2019-01-15 LAB — COMPLETE METABOLIC PANEL WITH GFR
AG Ratio: 1.6 (calc) (ref 1.0–2.5)
ALT: 15 U/L (ref 9–46)
AST: 15 U/L (ref 10–35)
Albumin: 4.5 g/dL (ref 3.6–5.1)
Alkaline phosphatase (APISO): 94 U/L (ref 35–144)
BUN: 14 mg/dL (ref 7–25)
CO2: 29 mmol/L (ref 20–32)
Calcium: 10.6 mg/dL — ABNORMAL HIGH (ref 8.6–10.3)
Chloride: 100 mmol/L (ref 98–110)
Creat: 1.03 mg/dL (ref 0.70–1.18)
GFR, Est African American: 83 mL/min/{1.73_m2} (ref >=60)
GFR, Est Non African American: 72 mL/min/{1.73_m2} (ref >=60)
Globulin: 2.9 g/dL (calc) (ref 1.9–3.7)
Glucose, Bld: 78 mg/dL (ref 65–99)
Potassium: 4.8 mmol/L (ref 3.5–5.3)
Sodium: 138 mmol/L (ref 135–146)
Total Bilirubin: 0.5 mg/dL (ref 0.2–1.2)
Total Protein: 7.4 g/dL (ref 6.1–8.1)

## 2019-01-15 LAB — CBC WITH DIFFERENTIAL/PLATELET
Absolute Monocytes: 502 cells/uL (ref 200–950)
Basophils Absolute: 68 cells/uL (ref 0–200)
Basophils Relative: 1.1 %
Eosinophils Absolute: 81 cells/uL (ref 15–500)
Eosinophils Relative: 1.3 %
HCT: 47.9 % (ref 38.5–50.0)
Hemoglobin: 16 g/dL (ref 13.2–17.1)
Lymphs Abs: 1730 cells/uL (ref 850–3900)
MCH: 32.1 pg (ref 27.0–33.0)
MCHC: 33.4 g/dL (ref 32.0–36.0)
MCV: 96.2 fL (ref 80.0–100.0)
MPV: 10.9 fL (ref 7.5–12.5)
Monocytes Relative: 8.1 %
Neutro Abs: 3819 cells/uL (ref 1500–7800)
Neutrophils Relative %: 61.6 %
Platelets: 290 10*3/uL (ref 140–400)
RBC: 4.98 10*6/uL (ref 4.20–5.80)
RDW: 12.8 % (ref 11.0–15.0)
Total Lymphocyte: 27.9 %
WBC: 6.2 10*3/uL (ref 3.8–10.8)

## 2019-01-15 LAB — LIPID PANEL
Cholesterol: 169 mg/dL (ref <200)
HDL: 39 mg/dL — ABNORMAL LOW (ref >=40)
LDL Cholesterol (Calc): 101 mg/dL (calc) — ABNORMAL HIGH
Non-HDL Cholesterol (Calc): 130 mg/dL (calc) — ABNORMAL HIGH (ref <130)
Total CHOL/HDL Ratio: 4.3 (calc) (ref <5.0)
Triglycerides: 172 mg/dL — ABNORMAL HIGH (ref <150)

## 2019-01-15 LAB — MAGNESIUM: Magnesium: 2.1 mg/dL (ref 1.5–2.5)

## 2019-01-15 LAB — TSH: TSH: 0.43 mIU/L (ref 0.40–4.50)

## 2019-02-03 ENCOUNTER — Other Ambulatory Visit: Payer: Self-pay

## 2019-02-03 ENCOUNTER — Ambulatory Visit
Admission: RE | Admit: 2019-02-03 | Discharge: 2019-02-03 | Disposition: A | Discharge status: Home / Self Care | Payer: Medicare HMO | Source: Ambulatory Visit | Attending: Adult Health | Admitting: Adult Health

## 2019-02-03 DIAGNOSIS — F17210 Nicotine dependence, cigarettes, uncomplicated: Secondary | ICD-10-CM | POA: Diagnosis not present

## 2019-02-03 DIAGNOSIS — F17200 Nicotine dependence, unspecified, uncomplicated: Secondary | ICD-10-CM

## 2019-02-03 DIAGNOSIS — Z122 Encounter for screening for malignant neoplasm of respiratory organs: Secondary | ICD-10-CM

## 2019-02-05 ENCOUNTER — Encounter: Payer: Self-pay | Admitting: Adult Health

## 2019-02-05 ENCOUNTER — Other Ambulatory Visit: Payer: Self-pay | Admitting: Adult Health

## 2019-02-05 DIAGNOSIS — R911 Solitary pulmonary nodule: Secondary | ICD-10-CM | POA: Insufficient documentation

## 2019-02-05 DIAGNOSIS — R918 Other nonspecific abnormal finding of lung field: Secondary | ICD-10-CM

## 2019-02-05 DIAGNOSIS — F17200 Nicotine dependence, unspecified, uncomplicated: Secondary | ICD-10-CM

## 2019-02-05 DIAGNOSIS — J449 Chronic obstructive pulmonary disease, unspecified: Secondary | ICD-10-CM

## 2019-02-16 ENCOUNTER — Ambulatory Visit (INDEPENDENT_AMBULATORY_CARE_PROVIDER_SITE_OTHER): Payer: Medicare HMO | Admitting: Pulmonary Disease

## 2019-02-16 ENCOUNTER — Other Ambulatory Visit: Payer: Self-pay

## 2019-02-16 ENCOUNTER — Encounter: Payer: Self-pay | Admitting: Pulmonary Disease

## 2019-02-16 VITALS — BP 116/60 | HR 75 | Temp 98.0°F | Ht 69.0 in | Wt 152.8 lb

## 2019-02-16 DIAGNOSIS — R911 Solitary pulmonary nodule: Secondary | ICD-10-CM

## 2019-02-16 DIAGNOSIS — Z720 Tobacco use: Secondary | ICD-10-CM

## 2019-02-16 DIAGNOSIS — F17210 Nicotine dependence, cigarettes, uncomplicated: Secondary | ICD-10-CM | POA: Diagnosis not present

## 2019-02-16 DIAGNOSIS — R918 Other nonspecific abnormal finding of lung field: Secondary | ICD-10-CM

## 2019-02-16 MED ORDER — VARENICLINE TARTRATE 0.5 MG X 11 & 1 MG X 42 PO MISC: ORAL | 0 refills | Status: DC

## 2019-02-16 NOTE — Progress Notes (Signed)
Subjective:   PATIENT ID: Jesus Bush GENDER: male DOB: 11-27-1945, MRN: [id number]   HPI  Chief Complaint  Patient presents with  . Consult    abnormal chest xray    Reason for Visit: New consult for abnormal CT  Mr. Jesus Bush is a 73 year old male active smoker with HTN and HLD who presents for abnormal CT Chest.  He is an active smoker, previously smoking 1.5 ppd for at least 35 years. He has cut down to 1/2 ppd and is interested in cutting back Reports chronic cough with intermittent white sputum production. Denies shortness of breath or wheezing. Denies any hospitalizations. 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 He is very active and can walk 2-3 miles daily. Denies weight loss, fevers, chills, hemoptysis  Social History: Active smoker. Currently smoking 8-9 cigarettes daily. Previously 1.5 ppd x 35 years.  Environmental exposures: IT trainer Involved during the [employer]  I have personally reviewed patient's past medical/family/social history, allergies, current medications.  Past Medical History:  Diagnosis Date  . Diabetes mellitus without complication ([place])   . Hyperlipidemia   . Hypertension   . Vitamin D deficiency      Family History  Problem Relation Age of Onset  . Heart attack Mother   . Heart disease Mother   . Heart attack Brother   . Hypertension Brother   . Diabetes Sister   . Heart disease Sister   . Heart disease Father   . Heart attack Father      Social History   Occupational History  . Occupation: retired  Tobacco Use  . Smoking status: Current Every Day Smoker    Packs/day: 1.00    Years: 33.00    Pack years: 33.00    Types: Cigarettes    Start date: 63  . Smokeless tobacco: Never Used  . Tobacco comment: smokes 4-5 cigarettes a week and uses E-cigarette  Substance and Sexual Activity  . Alcohol use: No    Alcohol/week: 0.0 standard drinks    Comment: He states he no longer drinks  alcohol  . Drug use: No  . Sexual activity: Not on file    Allergies  Allergen Reactions  . Ppd [Tuberculin Purified Protein Derivative]     Positive PPD.     Outpatient Medications Prior to Visit  Medication Sig Dispense Refill  . ACCU-CHEK SOFTCLIX LANCETS lancets Check blood sugar 1 time daily-DX-R73.09 100 each 3  . albuterol (VENTOLIN HFA) 108 (90 Base) MCG/ACT inhaler 2 inhalations 15 minutes apart every 4 hours to Rescue Asthma 48 g 3  . Alcohol Swabs (B-D SINGLE USE SWABS REGULAR) PADS Check blood sugar 1 time daily-DX-R73.09. 100 each 3  . aspirin 81 MG tablet Take 81 mg by mouth daily.    . baclofen (LIORESAL) 10 MG tablet TAKE 1/2 TO 1 TABLET TWICE DAILY ONLY AS NEEDED FOR MUSCLE SPASM(S) 180 tablet 0  . Blood Glucose Calibration (ACCU-CHEK AVIVA) SOLN Use per box instruction. 1 each 3  . Blood Glucose Monitoring Suppl (ACCU-CHEK AVIVA PLUS) w/Device KIT Check blood sugar 1 time daily-DX-R73.09 1 kit 0  . Cholecalciferol (VITAMIN D) 2000 UNITS CAPS Take 1 capsule by mouth daily.    Marland Kitchen DILT-XR 240 MG 24 hr capsule TAKE 1 CAPSULE DAILY FOR BLOOD PRESSURE AND HEART BEAT 90 capsule 1  . finasteride (PROSCAR) 5 MG tablet TAKE 1 TABLET DAILY FOR PROSTATE 90 tablet 3  . glucose blood (ACCU-CHEK AVIVA PLUS) test  strip Check blood sugar 1 time daily-R73.09. 100 each 3  . losartan (COZAAR) 100 MG tablet Take 1 tablet daily for BP 90 tablet 1  . magnesium gluconate (MAGONATE) 500 MG tablet Take 500 mg by mouth daily.     . montelukast (SINGULAIR) 10 MG tablet Take 1 tablet (10 mg total) by mouth daily. 90 tablet 3  . ondansetron (ZOFRAN ODT) 8 MG disintegrating tablet Dissolve 1 tablet under tongue every 6 to 8 hours if needed for [place] (Patient not taking: Reported on 01/14/2019) 20 tablet 0  . pantoprazole (PROTONIX) 40 MG tablet TAKE 1 TABLET EVERY DAY 90 tablet 1  . rosuvastatin (CRESTOR) 40 MG tablet Take 1 tablet Daily for Cholesterol 90 tablet 3   No facility-administered  medications prior to visit.     Review of Systems  Constitutional: Negative for chills, diaphoresis, fever, malaise/fatigue and weight loss.  HENT: Negative for congestion, ear pain and sore throat.   Respiratory: Positive for cough and sputum production. Negative for hemoptysis, shortness of breath and wheezing.   Cardiovascular: Negative for chest pain, palpitations and leg swelling.  Gastrointestinal: Positive for heartburn. Negative for abdominal pain and nausea.  Genitourinary: Negative for frequency.  Musculoskeletal: Negative for joint pain and myalgias.  Skin: Negative for itching and rash.  Neurological: Negative for dizziness, weakness and headaches.  Endo/Heme/Allergies: Does not bruise/bleed easily.  Psychiatric/Behavioral: Negative for depression. The patient is not nervous/anxious.      Objective:   Vitals:   02/16/19 1357  BP: 116/60  Pulse: 75  Temp: 98 F (36.7 C)  TempSrc: Temporal  SpO2: 97%  Weight: 152 lb 12.8 oz (69.3 kg)  Height: '5\' 9"'  (1.753 m)      Physical Exam: General: Well-appearing, no acute distress HENT: [place], AT Eyes: EOMI, no scleral icterus Respiratory: Clear to auscultation bilaterally.  No crackles, wheezing or rales Cardiovascular: RRR, -M/R/G, no JVD GI: BS+, soft, nontender Extremities:-Edema,-tenderness Neuro: AAO x4, CNII-XII grossly intact Skin: Intact, no rashes or bruising Psych: Normal mood, normal affect  Data Reviewed:  Imaging: CT Chest 02/03/19 - 6.5 mm pleural based nodule in the left upper lobe. Biapical scarring. Mild centrilobular emphysema.  PFT: None on file  Labs: CBC    Component Value Date/Time   WBC 6.2 01/14/2019 1119   RBC 4.98 01/14/2019 1119   HGB 16.0 01/14/2019 1119   HCT 47.9 01/14/2019 1119   PLT 290 01/14/2019 1119   MCV 96.2 01/14/2019 1119   MCH 32.1 01/14/2019 1119   MCHC 33.4 01/14/2019 1119   RDW 12.8 01/14/2019 1119   LYMPHSABS 1,730 01/14/2019 1119   MONOABS 693 08/06/2016 1450    EOSABS 81 01/14/2019 1119   BASOSABS 68 01/14/2019 1119   BMET    Component Value Date/Time   NA 138 01/14/2019 1119   K 4.8 01/14/2019 1119   CL 100 01/14/2019 1119   CO2 29 01/14/2019 1119   GLUCOSE 78 01/14/2019 1119   BUN 14 01/14/2019 1119   CREATININE 1.03 01/14/2019 1119   CALCIUM 10.6 (H) 01/14/2019 1119   GFRNONAA 72 01/14/2019 1119   GFRAA 83 01/14/2019 1119    Imaging, labs and tests noted above have been reviewed independently by me.    Assessment & Plan:   Discussion: 73 year old male active smoker with emphysema who presents with left upper lobe lung nodule. He has a chronic "smoker's cough" but otherwise asymptomatic and suffers no limitation in activity. He is interested in quitting smoking.  Left upper lobe  pleural-based lung nodule measuring 6.5m --CT Chest in 6 months  Tobacco Use Patient is an active smoker. We discussed smoking cessation for 8 minutes. We discussed triggers and stressors and ways to deal with them. We discussed barriers to continued smoking and benefits of smoking cessation. Provided patient with information cessation techniques and interventions including [place] quitline. --Prescribed Chantix. Please take as directed --If after 3 months, you wish to continue medication, please call our office to place another prescription  Health Maintenance Immunization History  Administered Date(s) Administered  . DTaP 05/23/2006  . Influenza Whole 12/16/2012  . Influenza, High Dose Seasonal PF 01/17/2015, 12/13/2015, 01/29/2017, 12/17/2017, 01/14/2019  . Influenza-Unspecified 12/27/2013  . Pneumococcal Conjugate-13 04/13/2014  . Pneumococcal Polysaccharide-23 09/13/2008, 08/06/2016  . Td 08/06/2016  . Zoster 04/17/2013    Orders Placed This Encounter  Procedures  . CT Chest Wo Contrast    Epic order Wt 152 / no needs / COVID-19 Q'S = NO Ins - humana mcr  Bl/shantelle @ [facility]    Standing Status:   Future    Standing Expiration Date:    04/17/2020    Order Specific Question:   Preferred imaging location?    Answer:   [id number]    Order Specific Question:   Radiology Contrast Protocol - do NOT remove file path    Answer:   [email or web address]   Meds ordered this encounter  Medications  . varenicline (CHANTIX PAK) 0.5 MG X 11 & 1 MG X 42 tablet    Sig: Take one 0.5 mg tablet by mouth once daily for 3 days, then increase to one 0.5 mg tablet twice daily for 4 days, then increase to one 1 mg tablet twice daily.    Dispense:  53 tablet    Refill:  0    Return in about 6 months (around 08/16/2019).  CLa Parguera MD [facility] 02/16/2019 9:01 AM  Office Number [phone]

## 2019-02-16 NOTE — Patient Instructions (Addendum)
Left upper lobe pleural-based lung nodule measuring 6.59mm --CT Chest in 6 months  Tobacco Use Patient is an active smoker. We discussed smoking cessation for 8 minutes. We discussed triggers and stressors and ways to deal with them. We discussed barriers to continued smoking and benefits of smoking cessation. Provided patient with information cessation techniques and interventions including [place] quitline. --Prescribed Chantix. Please take as directed --If after 3 months, you wish to continue medication, please call our office to place another prescription  Follow-up with Dr. Loanne Drilling in 6 months

## 2019-02-23 ENCOUNTER — Telehealth: Payer: Self-pay | Admitting: Pulmonary Disease

## 2019-02-23 NOTE — Telephone Encounter (Signed)
The CT was supposed to be done in 6 months, so she is right- needs to be reschedule  LMTCB and will forward to [facility] in the meantime to work on rescheduling this

## 2019-02-23 NOTE — Telephone Encounter (Signed)
I called GI and cancelled CT.  Called pt & told him doesn't need to be done til May so we will be calling him in April to schedule.  Rodena Piety has told [facility] not to worry with precert.  Nothing further needed.

## 2019-02-23 NOTE — Telephone Encounter (Signed)
Looks like [facility] scheduled this and has had to fax records to try to get precert.  I called & left her vm to make her aware CT needs to be done in 6 months so she doesn't need to get precerted now.

## 2019-02-25 ENCOUNTER — Other Ambulatory Visit: Payer: Self-pay

## 2019-02-25 DIAGNOSIS — Z20822 Contact with and (suspected) exposure to covid-19: Secondary | ICD-10-CM

## 2019-02-28 LAB — NOVEL CORONAVIRUS, NAA: SARS-CoV-2, NAA: NOT DETECTED

## 2019-03-02 ENCOUNTER — Other Ambulatory Visit: Payer: Medicare HMO

## 2019-03-31 ENCOUNTER — Ambulatory Visit: Payer: Self-pay | Admitting: Adult Health

## 2019-04-16 ENCOUNTER — Encounter: Payer: Self-pay | Admitting: Internal Medicine

## 2019-04-16 NOTE — Progress Notes (Signed)
History of Present Illness:      This very nice 74 y.o. MWM presents for 6 month follow up with HTN, HLD, Pre-Diabetes and Vitamin D Deficiency. Patient has GERD controlled on his meds. Patient has COPD consequent of over 35+ year smoking hoistory.       Patient is treated for HTN (2005)  & BP has been controlled at home. Today's BP is at goa l - 122/64. Patient has had no complaints of any cardiac type chest pain, palpitations, dyspnea / orthopnea / PND, dizziness, claudication, or dependent edema.      Hyperlipidemia is not controlled with diet & Rosuvaststin. Patient denies myalgias or other med SE's. Last Lipids were near goal for LDL with elevated Trig's:  Lab Results  Component Value Date   CHOL 169 01/14/2019   HDL 39 (L) 01/14/2019   LDLCALC 101 (H) 01/14/2019   TRIG 172 (H) 01/14/2019   CHOLHDL 4.3 01/14/2019        Also, the patient has history of T2_NIDDM (A1c 7.5% / 2010). He d/c'd his Metformin in 2018 and has controlled blood sugars with diet. He has had no symptoms of reactive hypoglycemia, diabetic polys, paresthesias or visual blurring.  Last A1c was not at goal:  Lab Results  Component Value Date   HGBA1C 6.1 (H) 10/13/2018        Further, the patient also has history of Vitamin D Deficiency("23"/ 2008)  and supplements vitamin D without any suspected side-effects. Last vitamin D was at goal:  Lab Results  Component Value Date   VD25OH 63 10/13/2018    Current Outpatient Medications on File Prior to Visit  Medication Sig  . aspirin 81 MG tablet Take 81 mg by mouth daily.  Marland Kitchen VITAMIN D 2000 UNITS  Take 1 capsule by mouth daily.  Marland Kitchen DILT-XR 240 MG 24 hr cap TAKE 1 CAPSULE DAILY   . losartan 100 MG tablet Take 1 tablet daily for BP  . magnesium  500 MG tablet Take 500 mg by mouth daily.   . rosuvastatin  40 MG tablet Take 1 tablet Daily for Cholesterol    Allergies  Allergen Reactions  . Ppd [Tuberculin Purified Protein Derivative] Positive PPD     PMHx:   Past Medical History:  Diagnosis Date  . Diabetes mellitus without complication ([place])   . Hyperlipidemia   . Hypertension   . Vitamin D deficiency     Immunization History  Administered Date(s) Administered  . DTaP 05/23/2006  . Influenza Whole 12/16/2012  . Influenza, High Dose Seasonal PF 01/17/2015, 12/13/2015, 01/29/2017, 12/17/2017, 01/14/2019  . Influenza-Unspecified 12/27/2013  . Pneumococcal Conjugate-13 04/13/2014  . Pneumococcal Polysaccharide-23 09/13/2008, 08/06/2016  . Td 08/06/2016  . Zoster 04/17/2013   Past Surgical History:  Procedure Laterality Date  . CATARACT EXTRACTION, BILATERAL Bilateral 2018   Dr. Coral Ceo    FHx:    Reviewed / unchanged  SHx:    Reviewed / unchanged   Systems Review:  Constitutional: Denies fever, chills, wt changes, headaches, insomnia, fatigue, night sweats, change in appetite. Eyes: Denies redness, blurred vision, diplopia, discharge, itchy, watery eyes.  ENT: Denies discharge, congestion, post nasal drip, epistaxis, sore throat, earache, hearing loss, dental pain, tinnitus, vertigo, sinus pain, snoring.  CV: Denies chest pain, palpitations, irregular heartbeat, syncope, dyspnea, diaphoresis, orthopnea, PND, claudication or edema. Respiratory: denies cough, dyspnea, DOE, pleurisy, hoarseness, laryngitis, wheezing.  Gastrointestinal: Denies dysphagia, odynophagia, heartburn, reflux, water brash, abdominal pain or cramps, nausea, vomiting, bloating,  diarrhea, constipation, hematemesis, melena, hematochezia  or hemorrhoids. Genitourinary: Denies dysuria, frequency, urgency, nocturia, hesitancy, discharge, hematuria or flank pain. Musculoskeletal: Denies arthralgias, myalgias, stiffness, jt. swelling, pain, limping or strain/sprain.  Skin: Denies pruritus, rash, hives, warts, acne, eczema or change in skin lesion(s). Neuro: No weakness, tremor, incoordination, spasms, paresthesia or pain. Psychiatric: Denies  confusion, memory loss or sensory loss. Endo: Denies change in weight, skin or hair change.  Heme/Lymph: No excessive bleeding, bruising or enlarged lymph nodes.  Physical Exam  BP 122/64   Pulse 64   Temp (!) 97.2 F (36.2 C)   Resp 16   Ht 5' 9.5" (1.765 m)   Wt 160 lb 3.2 oz (72.7 kg)   BMI 23.32 kg/m   Appears  well nourished, well groomed  and in no distress.  Eyes: PERRLA, EOMs, conjunctiva no swelling or erythema. Sinuses: No frontal/maxillary tenderness ENT/Mouth: EAC's clear, TM's nl w/o erythema, bulging. Nares clear w/o erythema, swelling, exudates. Oropharynx clear without erythema or exudates. Oral hygiene is good. Tongue normal, non obstructing. Hearing intact.  Neck: Supple. Thyroid not palpable. Car 2+/2+ without bruits, nodes or JVD. Chest: Respirations nl with BS clear & equal w/o rales, rhonchi, wheezing or stridor.  Cor: Heart sounds normal w/ regular rate and rhythm without sig. murmurs, gallops, clicks or rubs. Peripheral pulses normal and equal  without edema.  Abdomen: Soft & bowel sounds normal. Non-tender w/o guarding, rebound, hernias, masses or organomegaly.  Lymphatics: Unremarkable.  Musculoskeletal: Full ROM all peripheral extremities, joint stability, 5/5 strength and normal gait.  Skin: Warm, dry without exposed rashes, lesions or ecchymosis apparent.  Neuro: Cranial nerves intact, reflexes equal bilaterally. Sensory-motor testing grossly intact. Tendon reflexes grossly intact.  Pysch: Alert & oriented x 3.  Insight and judgement nl & appropriate. No ideations.  Assessment and Plan:  1. Essential hypertension  - Continue medication, monitor blood pressure at home.  - Continue DASH diet.  Reminder to go to the ER if any CP,  SOB, nausea, dizziness, severe HA, changes vision/speech.  - CBC with Differential/Platelet - COMPLETE METABOLIC PANEL WITH GFR - Magnesium - TSH  2. Hyperlipidemia, mixed  - Continue diet/meds, exercise,& lifestyle  modifications.  - Continue monitor periodic cholesterol/liver & renal functions   - Lipid panel - TSH  3. Diabetes mellitus without complication ([place])  - Continue diet, exercise  - Lifestyle modifications.  - Monitor appropriate labs.  - Hemoglobin A1c - Insulin, random  4. Vitamin D deficiency  - Continue supplementation.  - VITAMIN D 25 Hydroxy  5. Chronic obstructive pulmonary disease  ([place])   6. Medication management  - CBC with Differential/Platelet - COMPLETE METABOLIC PANEL WITH GFR - Magnesium - Lipid panel - TSH - Hemoglobin A1c - Insulin, random - VITAMIN D 25 Hydroxy        Discussed  regular exercise, BP monitoring, weight control to achieve/maintain BMI less than 25 and discussed med and SE's. Recommended labs to assess and monitor clinical status with further disposition pending results of labs.  I discussed the assessment and treatment plan with the patient. The patient was provided an opportunity to ask questions and all were answered. The patient agreed with the plan and demonstrated an understanding of the instructions.  I provided over 30 minutes of exam, counseling, chart review and  complex critical decision making.  Kirtland Bouchard, MD

## 2019-04-16 NOTE — Patient Instructions (Signed)

## 2019-04-17 ENCOUNTER — Other Ambulatory Visit: Payer: Self-pay | Admitting: *Deleted

## 2019-04-17 ENCOUNTER — Ambulatory Visit (INDEPENDENT_AMBULATORY_CARE_PROVIDER_SITE_OTHER): Payer: Medicare HMO | Admitting: Internal Medicine

## 2019-04-17 ENCOUNTER — Other Ambulatory Visit: Payer: Self-pay

## 2019-04-17 VITALS — BP 122/64 | HR 64 | Temp 97.2°F | Resp 16 | Ht 69.5 in | Wt 160.2 lb

## 2019-04-17 DIAGNOSIS — J449 Chronic obstructive pulmonary disease, unspecified: Secondary | ICD-10-CM

## 2019-04-17 DIAGNOSIS — Z79899 Other long term (current) drug therapy: Secondary | ICD-10-CM | POA: Diagnosis not present

## 2019-04-17 DIAGNOSIS — E782 Mixed hyperlipidemia: Secondary | ICD-10-CM | POA: Diagnosis not present

## 2019-04-17 DIAGNOSIS — I10 Essential (primary) hypertension: Secondary | ICD-10-CM | POA: Diagnosis not present

## 2019-04-17 DIAGNOSIS — E119 Type 2 diabetes mellitus without complications: Secondary | ICD-10-CM | POA: Diagnosis not present

## 2019-04-17 DIAGNOSIS — E559 Vitamin D deficiency, unspecified: Secondary | ICD-10-CM

## 2019-04-17 MED ORDER — ALBUTEROL SULFATE HFA 108 (90 BASE) MCG/ACT IN AERS: INHALATION_SPRAY | RESPIRATORY_TRACT | 3 refills | Status: DC

## 2019-04-20 LAB — CBC WITH DIFFERENTIAL/PLATELET
Absolute Monocytes: 587 cells/uL (ref 200–950)
Basophils Absolute: 59 cells/uL (ref 0–200)
Basophils Relative: 0.9 %
Eosinophils Absolute: 139 cells/uL (ref 15–500)
Eosinophils Relative: 2.1 %
HCT: 40.9 % (ref 38.5–50.0)
Hemoglobin: 14.1 g/dL (ref 13.2–17.1)
Lymphs Abs: 1439 cells/uL (ref 850–3900)
MCH: 32.6 pg (ref 27.0–33.0)
MCHC: 34.5 g/dL (ref 32.0–36.0)
MCV: 94.7 fL (ref 80.0–100.0)
MPV: 10.9 fL (ref 7.5–12.5)
Monocytes Relative: 8.9 %
Neutro Abs: 4376 cells/uL (ref 1500–7800)
Neutrophils Relative %: 66.3 %
Platelets: 259 10*3/uL (ref 140–400)
RBC: 4.32 10*6/uL (ref 4.20–5.80)
RDW: 12.6 % (ref 11.0–15.0)
Total Lymphocyte: 21.8 %
WBC: 6.6 10*3/uL (ref 3.8–10.8)

## 2019-04-20 LAB — COMPLETE METABOLIC PANEL WITH GFR
AG Ratio: 1.6 (calc) (ref 1.0–2.5)
ALT: 12 U/L (ref 9–46)
AST: 13 U/L (ref 10–35)
Albumin: 4.1 g/dL (ref 3.6–5.1)
Alkaline phosphatase (APISO): 69 U/L (ref 35–144)
BUN: 15 mg/dL (ref 7–25)
CO2: 31 mmol/L (ref 20–32)
Calcium: 9.9 mg/dL (ref 8.6–10.3)
Chloride: 105 mmol/L (ref 98–110)
Creat: 1.06 mg/dL (ref 0.70–1.18)
GFR, Est African American: 80 mL/min/{1.73_m2} (ref >=60)
GFR, Est Non African American: 69 mL/min/{1.73_m2} (ref >=60)
Globulin: 2.5 g/dL (calc) (ref 1.9–3.7)
Glucose, Bld: 91 mg/dL (ref 65–99)
Potassium: 4.6 mmol/L (ref 3.5–5.3)
Sodium: 141 mmol/L (ref 135–146)
Total Bilirubin: 0.3 mg/dL (ref 0.2–1.2)
Total Protein: 6.6 g/dL (ref 6.1–8.1)

## 2019-04-20 LAB — TSH: TSH: 0.41 mIU/L (ref 0.40–4.50)

## 2019-04-20 LAB — LIPID PANEL
Cholesterol: 159 mg/dL (ref <200)
HDL: 34 mg/dL — ABNORMAL LOW (ref >=40)
LDL Cholesterol (Calc): 93 mg/dL (calc)
Non-HDL Cholesterol (Calc): 125 mg/dL (calc) (ref <130)
Total CHOL/HDL Ratio: 4.7 (calc) (ref <5.0)
Triglycerides: 228 mg/dL — ABNORMAL HIGH (ref <150)

## 2019-04-20 LAB — HEMOGLOBIN A1C
Hgb A1c MFr Bld: 6.1 % of total Hgb — ABNORMAL HIGH (ref <5.7)
Mean Plasma Glucose: 128 (calc)
eAG (mmol/L): 7.1 (calc)

## 2019-04-20 LAB — INSULIN, RANDOM: Insulin: 10.2 u[IU]/mL

## 2019-04-20 LAB — VITAMIN D 25 HYDROXY (VIT D DEFICIENCY, FRACTURES): Vit D, 25-Hydroxy: 64 ng/mL (ref 30–100)

## 2019-04-20 LAB — MAGNESIUM: Magnesium: 2 mg/dL (ref 1.5–2.5)

## 2019-06-06 ENCOUNTER — Other Ambulatory Visit: Payer: Self-pay | Admitting: Internal Medicine

## 2019-06-18 ENCOUNTER — Telehealth: Payer: Self-pay | Admitting: *Deleted

## 2019-06-18 ENCOUNTER — Other Ambulatory Visit: Payer: Self-pay | Admitting: Internal Medicine

## 2019-06-18 MED ORDER — PROMETHAZINE-DM 6.25-15 MG/5ML PO SYRP: ORAL_SOLUTION | ORAL | 1 refills | Status: DC

## 2019-06-18 NOTE — Telephone Encounter (Signed)
Attempted to inform the patient an RX for cough medication has been sent to his pharmacy, per Dr Melford Aase. Patient VM has not been set up.

## 2019-06-18 NOTE — Telephone Encounter (Signed)
Patient is aware RX for cough medication has been sent to his pharmacy.

## 2019-07-15 NOTE — Progress Notes (Signed)
MEDICARE ANNUAL WELLNESS VISIT AND 3 MONTH FOLLOW UP Assessment:   Devion was seen today for medicare wellness, follow-up and insomnia.  Diagnoses and all orders for this visit:  Encounter for Medicare annual wellness exam Yearly  Essential hypertension Continue current medications Monitor blood pressure at home; call if consistently over 130/80 Continue DASH diet.   Reminder to go to the ER if any CP, SOB, nausea, dizziness, severe HA, changes vision/speech, left arm numbness and tingling and jaw pain. -     CBC with Differential/Platelet -     COMPLETE METABOLIC PANEL WITH GFR  Hyperlipidemia, mixed Continue medications: Discussed dietary and exercise modifications Low fat diet -     Lipid panel  Diabetes mellitus without complication (HCC) Discussed dietary and exercise modifications -     Hemoglobin A1c  Vitamin D deficiency Continue supplementation Taking Vitamin D 2,000 IU daily  Tobacco use disorder Continue annual CXR, not candidate for low dose CT at this time Discussed risks associated with tobacco use and advised to reduce or quit Declines Chantix or other medication Will follow up at the next visit  Chronic obstructive pulmonary disease, unspecified COPD type ([place]) Discussed risks associated with tobacco use and advised to reduce or quit Declines Chantix or other medication Will follow up at the next visit  Abnormal CT scan of lung Solitary pulmonary nodule CT ordered for follow up  Gastroesophageal reflux disease, unspecified whether esophagitis present Doing well at this time Diet discussed Monitor for triggers Avoid food with high acid content Avoid excessive cafeine Increase water intake  Aortic atherosclerosis (HCC) Control blood pressure, lipids and glucose Disscused lifestyle modifications, diet & exercise Continue to monitor   Medication management Continued  Seasonal allergic rhinitis due to pollen -     cetirizine (ZYRTEC  ALLERGY) 10 MG tablet; Take one tablet nightly for allergies. Doing well at this time  Primary insomnia Try OTC melatonin 64m,    Over 30 minutes of face to face interivew, exam, counseling, chart review, and critical decision making was performed Future Appointments  Date Time Provider [place] 08/12/2019 12:50 PM [facility] 1 [facility]  08/14/2019 10:00 AM Par[place], TFonnie Mu NP [facility] None  11/09/2019  3:00 PM MUnk Pinto MD [facility] None    Plan:   During the course of the visit the patient was educated and counseled about appropriate screening and preventive services including:    Pneumococcal vaccine   Influenza vaccine  Prevnar 13  Td vaccine  Screening electrocardiogram  Colorectal cancer screening  Diabetes screening  Glaucoma screening  Nutrition counseling    Subjective:  Jesus OEHLERTis a 74y.o. male who presents for Medicare Annual Wellness Visit.  Reports he is having difficulties with sleep.  He reports he lays down to sleep aronud 9-10pm but he can't go to sleep.    He is not longer smoking and used Chantix to help.  He took these for about one month.  He is still tobacco free today.   BMI is Body mass index is 23.29 kg/m., he has been working on diet and exercise. Wt Readings from Last 3 Encounters:  07/16/19 160 lb (72.6 kg)  04/17/19 160 lb 3.2 oz (72.7 kg)  02/16/19 152 lb 12.8 oz (69.3 kg)   His blood pressure has been controlled at home, today their BP is BP: 132/64 He does not workout. He denies chest pain, shortness of breath, dizziness.   He has COPD, for mer smoker.  He had  CT last year that showed nodule and due for follow up this year.   Medication Review: Current Outpatient Medications on File Prior to Visit  Medication Sig Dispense Refill  . ACCU-CHEK SOFTCLIX LANCETS lancets Check blood sugar 1 time daily-DX-R73.09 100 each 3  . albuterol (VENTOLIN HFA) 108 (90 Base) MCG/ACT inhaler 2  inhalations 15 minutes apart every 4 hours to Rescue Asthma 48 g 3  . Alcohol Swabs (B-D SINGLE USE SWABS REGULAR) PADS Check blood sugar 1 time daily-DX-R73.09. 100 each 3  . aspirin 81 MG tablet Take 81 mg by mouth daily.    . Blood Glucose Calibration (ACCU-CHEK AVIVA) SOLN Use per box instruction. 1 each 3  . Blood Glucose Monitoring Suppl (ACCU-CHEK AVIVA PLUS) w/Device KIT Check blood sugar 1 time daily-DX-R73.09 1 kit 0  . Cholecalciferol (VITAMIN D) 2000 UNITS CAPS Take 1 capsule by mouth daily.    Marland Kitchen DILT-XR 240 MG 24 hr capsule TAKE 1 CAPSULE DAILY FOR BLOOD PRESSURE AND HEART BEAT 90 capsule 1  . glucose blood (ACCU-CHEK AVIVA PLUS) test strip Check blood sugar 1 time daily-R73.09. 100 each 3  . losartan (COZAAR) 100 MG tablet Take 1 tablet Daily for BP 90 tablet 3  . magnesium gluconate (MAGONATE) 500 MG tablet Take 500 mg by mouth daily.     . promethazine-dextromethorphan (PROMETHAZINE-DM) 6.25-15 MG/5ML syrup Take 1 to 2 tsp enery 4 hours if needed for cough 360 mL 1  . rosuvastatin (CRESTOR) 40 MG tablet Take 1 tablet Daily for Cholesterol 90 tablet 3  . varenicline (CHANTIX PAK) 0.5 MG X 11 & 1 MG X 42 tablet Take one 0.5 mg tablet by mouth once daily for 3 days, then increase to one 0.5 mg tablet twice daily for 4 days, then increase to one 1 mg tablet twice daily. 53 tablet 0   No current facility-administered medications on file prior to visit.    Current Problems (verified) Patient Active Problem List   Diagnosis Date Noted  . Nodule of left lung 02/05/2019  . Hyperthyroidism 06/28/2018  . Aortic atherosclerosis ([place]) 09/09/2017  . COPD (chronic obstructive pulmonary disease) ([place]) 11/08/2016  . Esophageal reflux 04/25/2015  . Tobacco use disorder 07/23/2014  . Medication management 04/13/2014  . Essential hypertension 02/11/2013  . Mixed hyperlipidemia 02/11/2013  . Vitamin D deficiency 02/11/2013  . Abnormal glucose (prediabetes) 05/12/2012    Screening  Tests Immunization History  Administered Date(s) Administered  . DTaP 05/23/2006  . Influenza Whole 12/16/2012  . Influenza, High Dose Seasonal PF 01/17/2015, 12/13/2015, 01/29/2017, 12/17/2017, 01/14/2019  . Influenza-Unspecified 12/27/2013  . Pneumococcal Conjugate-13 04/13/2014  . Pneumococcal Polysaccharide-23 09/13/2008, 08/06/2016  . Td 08/06/2016  . Zoster 04/17/2013    Preventative care: Last colonoscopy: 2009  Cologuard: 08/2017 neg CXR 08/2017 he does smoke very little,   Prior vaccinations: TD or Tdap: 2018 Influenza: 2019 Pneumococcal: 2010  Prevnar13: 2016 Shingles/Zostavax: 2015  Names of Other Physician/Practitioners you currently use: 1. [facility] here for primary care 2. Dr. Einar Gip, eye doctor, last visit 2020 3. Does not see, dentures, dentist  Patient Care Team: Unk Pinto, MD as PCP - General (Internal Medicine) Unk Pinto, MD (Internal Medicine) Inda Castle, MD (Inactive) as Consulting Physician (Gastroenterology)  Allergies Allergies  Allergen Reactions  . Ppd [Tuberculin Purified Protein Derivative]     Positive PPD.    SURGICAL HISTORY He  has a past surgical history that includes Cataract extraction, bilateral (Bilateral, 2018). FAMILY HISTORY His family history  includes Diabetes in his sister; Heart attack in his brother, father, and mother; Heart disease in his father, mother, and sister; Hypertension in his brother. SOCIAL HISTORY He  reports that he has been smoking cigarettes. He started smoking about 34 years ago. He has a 66.00 pack-year smoking history. He has never used smokeless tobacco. He reports that he does not drink alcohol or use drugs.  MEDICARE WELLNESS OBJECTIVES: Physical activity: Current Exercise Habits: The patient has a physically strenuous job, but has no regular exercise apart from work. Cardiac risk factors: Cardiac Risk Factors include: advanced age  (>59mn, >>4women);dyslipidemia;hypertension;male gender Depression/mood screen:   [facility] 07/16/2019  Decreased Interest 0  Down, Depressed, Hopeless 0  PHQ - 2 Score 0    ADLs:  In your present state of health, do you have any difficulty performing the following activities: 07/16/2019 04/16/2019  Hearing? N N  Vision? N N  Difficulty concentrating or making decisions? N N  Walking or climbing stairs? N N  Dressing or bathing? N N  Doing errands, shopping? N N  Preparing Food and eating ? N -  Using the Toilet? N -  In the past six months, have you accidently leaked urine? N -  Do you have problems with loss of bowel control? N -  Managing your Medications? N -  Managing your Finances? N -  Housekeeping or managing your Housekeeping? N -  Some recent data might be hidden     Cognitive Testing  Alert? Yes  Normal Appearance?Yes  Oriented to person? Yes  Place? Yes   Time? Yes  Recall of three objects?  Yes  Can perform simple calculations? Yes  Displays appropriate judgment?Yes  Can read the correct time from a watch face?Yes  EOL planning: Does Patient Have a Medical Advance Directive?: No Would patient like information on creating a medical advance directive?: Yes (MAU/Ambulatory/Procedural Areas - Information given)   Objective:   Today's Vitals   07/16/19 1025  BP: 132/64  Pulse: 66  Temp: (!) 97.5 F (36.4 C)  SpO2: 96%  Weight: 160 lb (72.6 kg)  PainSc: 0-No pain   Body mass index is 23.29 kg/m.  General : Well sounding patient in no apparent distress HEENT: no hoarseness, no cough for duration of visit Lungs: speaks in complete sentences, no audible wheezing, no apparent distress Neurological: alert, oriented x 3 Psychiatric: pleasant, judgement appropriate    Medicare Attestation I have personally reviewed: The patient's medical and social history Their use of alcohol, tobacco or illicit drugs Their current medications and  supplements The patient's functional ability including ADLs,fall risks, home safety risks, cognitive, and hearing and visual impairment Diet and physical activities Evidence for depression or mood disorders  The patient's weight, height, BMI, and visual acuity have been recorded in the chart.  I have made referrals, counseling, and provided education to the patient based on review of the above and I have provided the patient with a written personalized care plan for preventive services.     KGarnet Sierras NP   07/16/2019

## 2019-07-16 ENCOUNTER — Other Ambulatory Visit: Payer: Self-pay

## 2019-07-16 ENCOUNTER — Ambulatory Visit (INDEPENDENT_AMBULATORY_CARE_PROVIDER_SITE_OTHER): Payer: Medicare HMO | Admitting: Adult Health Nurse Practitioner

## 2019-07-16 ENCOUNTER — Encounter: Payer: Self-pay | Admitting: Adult Health Nurse Practitioner

## 2019-07-16 VITALS — BP 132/64 | HR 66 | Temp 97.5°F | Wt 160.0 lb

## 2019-07-16 DIAGNOSIS — F5101 Primary insomnia: Secondary | ICD-10-CM

## 2019-07-16 DIAGNOSIS — F17200 Nicotine dependence, unspecified, uncomplicated: Secondary | ICD-10-CM | POA: Diagnosis not present

## 2019-07-16 DIAGNOSIS — E559 Vitamin D deficiency, unspecified: Secondary | ICD-10-CM

## 2019-07-16 DIAGNOSIS — Z0000 Encounter for general adult medical examination without abnormal findings: Secondary | ICD-10-CM

## 2019-07-16 DIAGNOSIS — J449 Chronic obstructive pulmonary disease, unspecified: Secondary | ICD-10-CM | POA: Diagnosis not present

## 2019-07-16 DIAGNOSIS — I10 Essential (primary) hypertension: Secondary | ICD-10-CM

## 2019-07-16 DIAGNOSIS — I700 Atherosclerosis of aorta: Secondary | ICD-10-CM | POA: Diagnosis not present

## 2019-07-16 DIAGNOSIS — E782 Mixed hyperlipidemia: Secondary | ICD-10-CM | POA: Diagnosis not present

## 2019-07-16 DIAGNOSIS — R911 Solitary pulmonary nodule: Secondary | ICD-10-CM

## 2019-07-16 DIAGNOSIS — R918 Other nonspecific abnormal finding of lung field: Secondary | ICD-10-CM

## 2019-07-16 DIAGNOSIS — E119 Type 2 diabetes mellitus without complications: Secondary | ICD-10-CM | POA: Diagnosis not present

## 2019-07-16 DIAGNOSIS — K219 Gastro-esophageal reflux disease without esophagitis: Secondary | ICD-10-CM | POA: Diagnosis not present

## 2019-07-16 DIAGNOSIS — Z79899 Other long term (current) drug therapy: Secondary | ICD-10-CM

## 2019-07-16 DIAGNOSIS — J301 Allergic rhinitis due to pollen: Secondary | ICD-10-CM

## 2019-07-16 MED ORDER — CETIRIZINE HCL 10 MG PO TABS: ORAL_TABLET | ORAL | 1 refills | Status: DC

## 2019-07-16 NOTE — Patient Instructions (Addendum)
We will contact you in 1-3 days with your lab results through [facility].   Try Meltonin 10mg .  Take this 75min to 1 hours prior to going to sleep.  You can get this at any pharmacy.   You can also try the prescription, zyrtec (Cetirazine) we sent in for you.  Take 18min to 1hour prior to going to bed.      Due to recent changes in healthcare laws, you may see the results of your imaging and laboratory studies on MyChart before your provider has had a chance to review them.  We understand that in some cases there may be results that are confusing or concerning to you. Not all laboratory results come back in the same time frame and the provider may be waiting for multiple results in order to interpret others.  Please give [place] 48 hours in order for your provider to thoroughly review all the results before contacting the office for clarification of your results.   +++++++++++++++++++++++++++++++  Vit D  & Vit C 1,000 mg   are recommended to help protect  against the Covid-19 and other Corona viruses.    Also it's recommended  to take  Zinc 50 mg  to help  protect against the Covid-19   and best place to get  is also on [email or web address]  and don't pay more than 6-8 cents /pill !  ================================ Coronavirus (COVID-19) Are you at risk?  Are you at risk for the Coronavirus (COVID-19)?  To be considered HIGH RISK for Coronavirus (COVID-19), you have to meet the following criteria:  . Traveled to [place], [place], [place], [place] or [place]; or in the [place] to [place], [place], [place]  . or [place]; and have fever, cough, and shortness of breath within the last 2 weeks of travel OR . Been in close contact with a person diagnosed with COVID-19 within the last 2 weeks and have  . fever, cough,and shortness of breath .  . IF YOU DO NOT MEET THESE CRITERIA, YOU ARE CONSIDERED LOW RISK FOR COVID-19.  What to do if you are HIGH RISK for COVID-19?  Marland Kitchen If you are  having a medical emergency, call 911. . Seek medical care right away. Before you go to a doctor's office, urgent care or emergency department, .  call ahead and tell them about your recent travel, contact with someone diagnosed with COVID-19  .  and your symptoms.  . You should receive instructions from your physician's office regarding next steps of care.  . When you arrive at healthcare provider, tell the healthcare staff immediately you have returned from  . visiting [place], [place], [place], [place] or [place]; or traveled in the [place] to [place], [place],  . [place] or [place] in the last two weeks or you have been in close contact with a person diagnosed with  . COVID-19 in the last 2 weeks.   . Tell the health care staff about your symptoms: fever, cough and shortness of breath. . After you have been seen by a medical provider, you will be either: o Tested for (COVID-19) and discharged home on quarantine except to seek medical care if  o symptoms worsen, and asked to  - Stay home and avoid contact with others until you get your results (4-5 days)  - Avoid travel on public transportation if possible (such as bus, train, or airplane) or o Sent to the Emergency Department by EMS for evaluation, COVID-19 testing  and  o possible admission depending on your condition and test results.  What to do if you are LOW RISK for COVID-19?  Reduce your risk of any infection by using the same precautions used for avoiding the common cold or flu:  Marland Kitchen Wash your hands often with soap and warm water for at least 20 seconds.  If soap and water are not readily available,  . use an alcohol-based hand sanitizer with at least 60% alcohol.  . If coughing or sneezing, cover your mouth and nose by coughing or sneezing into the elbow areas of your shirt or coat, .  into a tissue or into your sleeve (not your hands). . Avoid shaking hands with others and consider head nods or verbal greetings  only. . Avoid touching your eyes, nose, or mouth with unwashed hands.  . Avoid close contact with people who are sick. . Avoid places or events with large numbers of people in one location, like concerts or sporting events. . Carefully consider travel plans you have or are making. . If you are planning any travel outside or inside the [place], visit the CDC's Travelers' Health webpage for the latest health notices. . If you have some symptoms but not all symptoms, continue to monitor at home and seek medical attention  . if your symptoms worsen. . If you are having a medical emergency, call 911. >>>>>>>>>>>>>>>>>>>>>>>>>>>>>>>>>>>>>>>>>>>>>>>>>>>>>>> We Do NOT Approve of  [facility] [place] Soliciting Our Patients  To Do Home Visits  & We Do NOT Approve of [employer] > > > > > > > > > > > > > > > > > > > > > > > > > > > > > > > > > > >  > > > >   Preventive Care for Adults  A healthy lifestyle and preventive care can promote health and wellness. Preventive health guidelines for men include the following key practices:  A routine yearly physical is a good way to check with your health care provider about your health and preventative screening. It is a chance to share any concerns and updates on your health and to receive a thorough exam.  Visit your dentist for a routine exam and preventative care every 6 months. Brush your teeth twice a day and floss once a day. Good oral hygiene prevents tooth decay and gum disease.  The frequency of eye exams is based on your age, health, family medical history, use of contact lenses, and other factors. Follow your health care provider's recommendations for frequency of eye exams.  Eat a healthy diet. Foods such as vegetables, fruits, whole grains, low-fat dairy products, and lean protein foods contain the nutrients you need without too many calories. Decrease your intake of foods high in solid fats, added sugars, and salt. Eat the  right amount of calories for you. Get information about a proper diet from your health care provider, if necessary.  Regular physical exercise is one of the most important things you can do for your health. Most adults should get at least 150 minutes of moderate-intensity exercise (any activity that increases your heart rate and causes you to sweat) each week. In addition, most adults need muscle-strengthening exercises on 2 or more days a week.  Maintain a healthy weight. The body mass index (BMI) is a screening tool to identify possible weight problems. It provides an estimate of body fat based on height and weight. Your health care provider can find your BMI and  can help you achieve or maintain a healthy weight. For adults 20 years and older:  A BMI below 18.5 is considered underweight.  A BMI of 18.5 to 24.9 is normal.  A BMI of 25 to 29.9 is considered overweight.  A BMI of 30 and above is considered obese.  Maintain normal blood lipids and cholesterol levels by exercising and minimizing your intake of saturated fat. Eat a balanced diet with plenty of fruit and vegetables. Blood tests for lipids and cholesterol should begin at age 100 and be repeated every 5 years. If your lipid or cholesterol levels are high, you are over 50, or you are at high risk for heart disease, you may need your cholesterol levels checked more frequently. Ongoing high lipid and cholesterol levels should be treated with medicines if diet and exercise are not working.  If you smoke, find out from your health care provider how to quit. If you do not use tobacco, do not start.  Lung cancer screening is recommended for adults aged 105-80 years who are at high risk for developing lung cancer because of a history of smoking. A yearly low-dose CT scan of the lungs is recommended for people who have at least a 30-pack-year history of smoking and are a current smoker or have quit within the past 15 years. A pack year of smoking is  smoking an average of 1 pack of cigarettes a day for 1 year (for example: 1 pack a day for 30 years or 2 packs a day for 15 years). Yearly screening should continue until the smoker has stopped smoking for at least 15 years. Yearly screening should be stopped for people who develop a health problem that would prevent them from having lung cancer treatment.  If you choose to drink alcohol, do not have more than 2 drinks per day. One drink is considered to be 12 ounces (355 mL) of beer, 5 ounces (148 mL) of wine, or 1.5 ounces (44 mL) of liquor.  Avoid use of street drugs. Do not share needles with anyone. Ask for help if you need support or instructions about stopping the use of drugs.  High blood pressure causes heart disease and increases the risk of stroke. Your blood pressure should be checked at least every 1-2 years. Ongoing high blood pressure should be treated with medicines, if weight loss and exercise are not effective.  If you are 1-71 years old, ask your health care provider if you should take aspirin to prevent heart disease.  Diabetes screening involves taking a blood sample to check your fasting blood sugar level. Testing should be considered at a younger age or be carried out more frequently if you are overweight and have at least 1 risk factor for diabetes.  Colorectal cancer can be detected and often prevented. Most routine colorectal cancer screening begins at the age of 25 and continues through age 74. However, your health care provider may recommend screening at an earlier age if you have risk factors for colon cancer. On a yearly basis, your health care provider may provide home test kits to check for hidden blood in the stool. Use of a small camera at the end of a tube to directly examine the colon (sigmoidoscopy or colonoscopy) can detect the earliest forms of colorectal cancer. Talk to your health care provider about this at age 29, when routine screening begins. Direct exam of  the colon should be repeated every 5-10 years through age 51, unless early forms of precancerous  polyps or small growths are found.  Hepatitis C blood testing is recommended for all people born from [birth year] through 1965 and any individual with known risks for hepatitis C.  Screening for abdominal aortic aneurysm (AAA)  by ultrasound is recommended for people who have history of high blood pressure or who are current or former smokers.  Healthy men should  receive prostate-specific antigen (PSA) blood tests as part of routine cancer screening. Talk with your health care provider about prostate cancer screening.  Testicular cancer screening is  recommended for adult males. Screening includes self-exam, a health care provider exam, and other screening tests. Consult with your health care provider about any symptoms you have or any concerns you have about testicular cancer.  Use sunscreen. Apply sunscreen liberally and repeatedly throughout the day. You should seek shade when your shadow is shorter than you. Protect yourself by wearing long sleeves, pants, a wide-brimmed hat, and sunglasses year round, whenever you are outdoors.  Once a month, do a whole-body skin exam, using a mirror to look at the skin on your back. Tell your health care provider about new moles, moles that have irregular borders, moles that are larger than a pencil eraser, or moles that have changed in shape or color.  Stay current with required vaccines (immunizations).  Influenza vaccine. All adults should be immunized every year.  Tetanus, diphtheria, and acellular pertussis (Td, Tdap) vaccine. An adult who has not previously received Tdap or who does not know his vaccine status should receive 1 dose of Tdap. This initial dose should be followed by tetanus and diphtheria toxoids (Td) booster doses every 10 years. Adults with an unknown or incomplete history of completing a 3-dose immunization series with Td-containing vaccines  should begin or complete a primary immunization series including a Tdap dose. Adults should receive a Td booster every 10 years.  Zoster vaccine. One dose is recommended for adults aged 88 years or older unless certain conditions are present.    PREVNAR - Pneumococcal 13-valent conjugate (PCV13) vaccine. When indicated, a person who is uncertain of his immunization history and has no record of immunization should receive the PCV13 vaccine. An adult aged 72 years or older who has certain medical conditions and has not been previously immunized should receive 1 dose of PCV13 vaccine. This PCV13 should be followed with a dose of pneumococcal polysaccharide (PPSV23) vaccine. The PPSV23 vaccine dose should be obtained 1 or more year(s)after the dose of PCV13 vaccine. An adult aged 76 years or older who has certain medical conditions and previously received 1 or more doses of PPSV23 vaccine should receive 1 dose of PCV13. The PCV13 vaccine dose should be obtained 1 or more years after the last PPSV23 vaccine dose.    PNEUMOVAX - Pneumococcal polysaccharide (PPSV23) vaccine. When PCV13 is also indicated, PCV13 should be obtained first. All adults aged 95 years and older should be immunized. An adult younger than age 46 years who has certain medical conditions should be immunized. Any person who resides in a nursing home or long-term care facility should be immunized. An adult smoker should be immunized. People with an immunocompromised condition and certain other conditions should receive both PCV13 and PPSV23 vaccines. People with human immunodeficiency virus (HIV) infection should be immunized as soon as possible after diagnosis. Immunization during chemotherapy or radiation therapy should be avoided. Routine use of PPSV23 vaccine is not recommended for American Indians, [place] Natives, or people younger than 65 years unless there are medical conditions that  require PPSV23 vaccine. When indicated, people who  have unknown immunization and have no record of immunization should receive PPSV23 vaccine. One-time revaccination 5 years after the first dose of PPSV23 is recommended for people aged 19-64 years who have chronic kidney failure, nephrotic syndrome, asplenia, or immunocompromised conditions. People who received 1-2 doses of PPSV23 before age 27 years should receive another dose of PPSV23 vaccine at age 29 years or later if at least 5 years have passed since the previous dose. Doses of PPSV23 are not needed for people immunized with PPSV23 at or after age 42 years.    Hepatitis A vaccine. Adults who wish to be protected from this disease, have certain high-risk conditions, work with hepatitis A-infected animals, work in hepatitis A research labs, or travel to or work in countries with a high rate of hepatitis A should be immunized. Adults who were previously unvaccinated and who anticipate close contact with an international adoptee during the first 60 days after arrival in the [place] from a country with a high rate of hepatitis A should be immunized.    Hepatitis B vaccine. Adults should be immunized if they wish to be protected from this disease, have certain high-risk conditions, may be exposed to blood or other infectious body fluids, are household contacts or sex partners of hepatitis B positive people, are clients or workers in certain care facilities, or travel to or work in countries with a high rate of hepatitis B.   Preventive Service / Frequency   Ages 45 and over  Blood pressure check.  Lipid and cholesterol check.  Lung cancer screening. / Every year if you are aged 51-80 years and have a 30-pack-year history of smoking and currently smoke or have quit within the past 15 years. Yearly screening is stopped once you have quit smoking for at least 15 years or develop a health problem that would prevent you from having lung cancer treatment.  Fecal occult blood test (FOBT) of  stool. You may not have to do this test if you get a colonoscopy every 10 years.  Flexible sigmoidoscopy** or colonoscopy.** / Every 5 years for a flexible sigmoidoscopy or every 10 years for a colonoscopy beginning at age 62 and continuing until age 55.  Hepatitis C blood test.** / For all people born from [birth year] through 1966 and any individual with known risks for hepatitis C.  Abdominal aortic aneurysm (AAA) screening./ Screening current or former smokers or have Hypertension.  Skin self-exam. / Monthly.  Influenza vaccine. / Every year.  Tetanus, diphtheria, and acellular pertussis (Tdap/Td) vaccine.** / 1 dose of Td every 10 years.   Zoster vaccine.** / 1 dose for adults aged 81 years or older.         Pneumococcal 13-valent conjugate (PCV13) vaccine.    Pneumococcal polysaccharide (PPSV23) vaccine.     Hepatitis A vaccine.** / Consult your health care provider.  Hepatitis B vaccine.** / Consult your health care provider. Screening for abdominal aortic aneurysm (AAA)  by ultrasound is recommended for people who have history of high blood pressure or who are current or former smokers. ++++++++++ Recommend Adult Low Dose Aspirin or  coated  Aspirin 81 mg daily  To reduce risk of Colon Cancer 40 %,  Skin Cancer 26 % ,  Malignant Melanoma 46%  and  Pancreatic cancer 60% ++++++++++++++++++++++ Vitamin D goal  is between 70-100.  Please make sure that you are taking your Vitamin D as directed.  It is very important as  a natural anti-inflammatory  helping hair, skin, and nails, as well as reducing stroke and heart attack risk.  It helps your bones and helps with mood. It also decreases numerous cancer risks so please take it as directed.  Low Vit D is associated with a 200-300% higher risk for CANCER  and 200-300% higher risk for HEART   ATTACK  &  STROKE.   .....................................Marland Kitchen It is also associated with higher death rate at younger ages,  autoimmune  diseases like Rheumatoid arthritis, Lupus, Multiple Sclerosis.    Also many other serious conditions, like depression, Alzheimer's Dementia, infertility, muscle aches, fatigue, fibromyalgia - just to name a few. ++++++++++++++++++++++ Recommend the book "The END of DIETING" by Dr Excell Seltzer  & the book "The END of DIABETES " by Dr Excell Seltzer At [email or web address] - get book & Audio CD's    Being diabetic has a  300% increased risk for heart attack, stroke, cancer, and alzheimer- type vascular dementia. It is very important that you work harder with diet by avoiding all foods that are white. Avoid white rice (brown & wild rice is OK), white potatoes (sweetpotatoes in moderation is OK), White bread or wheat bread or anything made out of white flour like bagels, donuts, rolls, buns, biscuits, cakes, pastries, cookies, pizza crust, and pasta (made from white flour & egg whites) - vegetarian pasta or spinach or wheat pasta is OK. Multigrain breads like Arnold's or [employer], or multigrain sandwich thins or flatbreads.  Diet, exercise and weight loss can reverse and cure diabetes in the early stages.  Diet, exercise and weight loss is very important in the control and prevention of complications of diabetes which affects every system in your body, ie. Brain - dementia/stroke, eyes - glaucoma/blindness, heart - heart attack/heart failure, kidneys - dialysis, stomach - gastric paralysis, intestines - malabsorption, nerves - severe painful neuritis, circulation - gangrene & loss of a leg(s), and finally cancer and Alzheimers.    I recommend avoid fried & greasy foods,  sweets/candy, white rice (brown or wild rice or Quinoa is OK), white potatoes (sweet potatoes are OK) - anything made from white flour - bagels, doughnuts, rolls, buns, biscuits,white and wheat breads, pizza crust and traditional pasta made of white flour & egg white(vegetarian pasta or spinach or wheat pasta is OK).  Multi-grain bread is OK - like  multi-grain flat bread or sandwich thins. Avoid alcohol in excess. Exercise is also important.    Eat all the vegetables you want - avoid meat, especially red meat and dairy - especially cheese.  Cheese is the most concentrated form of trans-fats which is the worst thing to clog up our arteries. Veggie cheese is OK which can be found in the fresh produce section at [employer] or [employer] or [employer]  ++++++++++++++++++++++ DASH Eating Plan  DASH stands for "Dietary Approaches to Stop Hypertension."   The DASH eating plan is a healthy eating plan that has been shown to reduce high blood pressure (hypertension). Additional health benefits may include reducing the risk of type 2 diabetes mellitus, heart disease, and stroke. The DASH eating plan may also help with weight loss. WHAT DO I NEED TO KNOW ABOUT THE DASH EATING PLAN? For the DASH eating plan, you will follow these general guidelines:  Choose foods with a percent daily value for sodium of less than 5% (as listed on the food label).  Use salt-free seasonings or herbs instead of table salt or sea salt.  Check with your  health care provider or pharmacist before using salt substitutes.  Eat lower-sodium products, often labeled as "lower sodium" or "no salt added."  Eat fresh foods.  Eat more vegetables, fruits, and low-fat dairy products.  Choose whole grains. Look for the word "whole" as the first word in the ingredient list.  Choose fish   Limit sweets, desserts, sugars, and sugary drinks.  Choose heart-healthy fats.  Eat veggie cheese   Eat more home-cooked food and less restaurant, buffet, and fast food.  Limit fried foods.  Cook foods using methods other than frying.  Limit canned vegetables. If you do use them, rinse them well to decrease the sodium.  When eating at a restaurant, ask that your food be prepared with less salt, or no salt if possible.                      WHAT FOODS CAN I EAT? Read Dr Fara Olden  Fuhrman's books on The End of Dieting & The End of Diabetes  Grains Whole grain or whole wheat bread. Brown rice. Whole grain or whole wheat pasta. Quinoa, bulgur, and whole grain cereals. Low-sodium cereals. Corn or whole wheat flour tortillas. Whole grain cornbread. Whole grain crackers. Low-sodium crackers.  Vegetables Fresh or frozen vegetables (raw, steamed, roasted, or grilled). Low-sodium or reduced-sodium tomato and vegetable juices. Low-sodium or reduced-sodium tomato sauce and paste. Low-sodium or reduced-sodium canned vegetables.   Fruits All fresh, canned (in natural juice), or frozen fruits.  Protein Products  All fish and seafood.  Dried beans, peas, or lentils. Unsalted nuts and seeds. Unsalted canned beans.  Dairy Low-fat dairy products, such as skim or 1% milk, 2% or reduced-fat cheeses, low-fat ricotta or cottage cheese, or plain low-fat yogurt. Low-sodium or reduced-sodium cheeses.  Fats and Oils Tub margarines without trans fats. Light or reduced-fat mayonnaise and salad dressings (reduced sodium). Avocado. Safflower, olive, or canola oils. Natural peanut or almond butter.  Other Unsalted popcorn and pretzels. The items listed above may not be a complete list of recommended foods or beverages. Contact your dietitian for more options.  ++++++++++++++++++++  WHAT FOODS ARE NOT RECOMMENDED? Grains/ White flour or wheat flour White bread. White pasta. White rice. Refined cornbread. Bagels and croissants. Crackers that contain trans fat.  Vegetables  Creamed or fried vegetables. Vegetables in a . Regular canned vegetables. Regular canned tomato sauce and paste. Regular tomato and vegetable juices.  Fruits Dried fruits. Canned fruit in light or heavy syrup. Fruit juice.  Meat and Other Protein Products Meat in general - RED meat & White meat.  Fatty cuts of meat. Ribs, chicken wings, all processed meats as bacon, sausage, bologna, salami, fatback, hot dogs,  bratwurst and packaged luncheon meats.  Dairy Whole or 2% milk, cream, half-and-half, and cream cheese. Whole-fat or sweetened yogurt. Full-fat cheeses or blue cheese. Non-dairy creamers and whipped toppings. Processed cheese, cheese spreads, or cheese curds.  Condiments Onion and garlic salt, seasoned salt, table salt, and sea salt. Canned and packaged gravies. Worcestershire sauce. Tartar sauce. Barbecue sauce. Teriyaki sauce. Soy sauce, including reduced sodium. Steak sauce. Fish sauce. Oyster sauce. Cocktail sauce. Horseradish. Ketchup and mustard. Meat flavorings and tenderizers. Bouillon cubes. Hot sauce. Tabasco sauce. Marinades. Taco seasonings. Relishes.  Fats and Oils Butter, stick margarine, lard, shortening and bacon fat. Coconut, palm kernel, or palm oils. Regular salad dressings.  Pickles and olives. Salted popcorn and pretzels.  The items listed above may not be a complete list of foods and beverages  avoid.    

## 2019-07-17 LAB — COMPLETE METABOLIC PANEL WITH GFR
AG Ratio: 1.6 (calc) (ref 1.0–2.5)
ALT: 15 U/L (ref 9–46)
AST: 16 U/L (ref 10–35)
Albumin: 4.5 g/dL (ref 3.6–5.1)
Alkaline phosphatase (APISO): 94 U/L (ref 35–144)
BUN: 10 mg/dL (ref 7–25)
CO2: 29 mmol/L (ref 20–32)
Calcium: 10.1 mg/dL (ref 8.6–10.3)
Chloride: 101 mmol/L (ref 98–110)
Creat: 0.97 mg/dL (ref 0.70–1.18)
GFR, Est African American: 89 mL/min/{1.73_m2} (ref >=60)
GFR, Est Non African American: 77 mL/min/{1.73_m2} (ref >=60)
Globulin: 2.8 g/dL (calc) (ref 1.9–3.7)
Glucose, Bld: 100 mg/dL — ABNORMAL HIGH (ref 65–99)
Potassium: 4.4 mmol/L (ref 3.5–5.3)
Sodium: 138 mmol/L (ref 135–146)
Total Bilirubin: 0.6 mg/dL (ref 0.2–1.2)
Total Protein: 7.3 g/dL (ref 6.1–8.1)

## 2019-07-17 LAB — LIPID PANEL
Cholesterol: 164 mg/dL (ref <200)
HDL: 38 mg/dL — ABNORMAL LOW (ref >=40)
LDL Cholesterol (Calc): 99 mg/dL (calc)
Non-HDL Cholesterol (Calc): 126 mg/dL (calc) (ref <130)
Total CHOL/HDL Ratio: 4.3 (calc) (ref <5.0)
Triglycerides: 169 mg/dL — ABNORMAL HIGH (ref <150)

## 2019-07-17 LAB — CBC WITH DIFFERENTIAL/PLATELET
Absolute Monocytes: 672 cells/uL (ref 200–950)
Basophils Absolute: 68 cells/uL (ref 0–200)
Basophils Relative: 0.8 %
Eosinophils Absolute: 94 cells/uL (ref 15–500)
Eosinophils Relative: 1.1 %
HCT: 45.1 % (ref 38.5–50.0)
Hemoglobin: 15 g/dL (ref 13.2–17.1)
Lymphs Abs: 1777 cells/uL (ref 850–3900)
MCH: 32.1 pg (ref 27.0–33.0)
MCHC: 33.3 g/dL (ref 32.0–36.0)
MCV: 96.4 fL (ref 80.0–100.0)
MPV: 10.6 fL (ref 7.5–12.5)
Monocytes Relative: 7.9 %
Neutro Abs: 5891 cells/uL (ref 1500–7800)
Neutrophils Relative %: 69.3 %
Platelets: 263 10*3/uL (ref 140–400)
RBC: 4.68 10*6/uL (ref 4.20–5.80)
RDW: 12.2 % (ref 11.0–15.0)
Total Lymphocyte: 20.9 %
WBC: 8.5 10*3/uL (ref 3.8–10.8)

## 2019-07-17 LAB — HEMOGLOBIN A1C
Hgb A1c MFr Bld: 5.9 % of total Hgb — ABNORMAL HIGH (ref <5.7)
Mean Plasma Glucose: 123 (calc)
eAG (mmol/L): 6.8 (calc)

## 2019-07-22 ENCOUNTER — Ambulatory Visit: Payer: Medicare HMO | Admitting: Adult Health

## 2019-08-07 IMAGING — CR DG CHEST 2V
2 series · 2 of 2 positions shown · non-contrast
Comparison: 10/14/2016

CLINICAL DATA: Cough productive of phlegm for 2 months worsening in
evening, history smoking

EXAM:
CHEST - 2 VIEW

[chest pa]
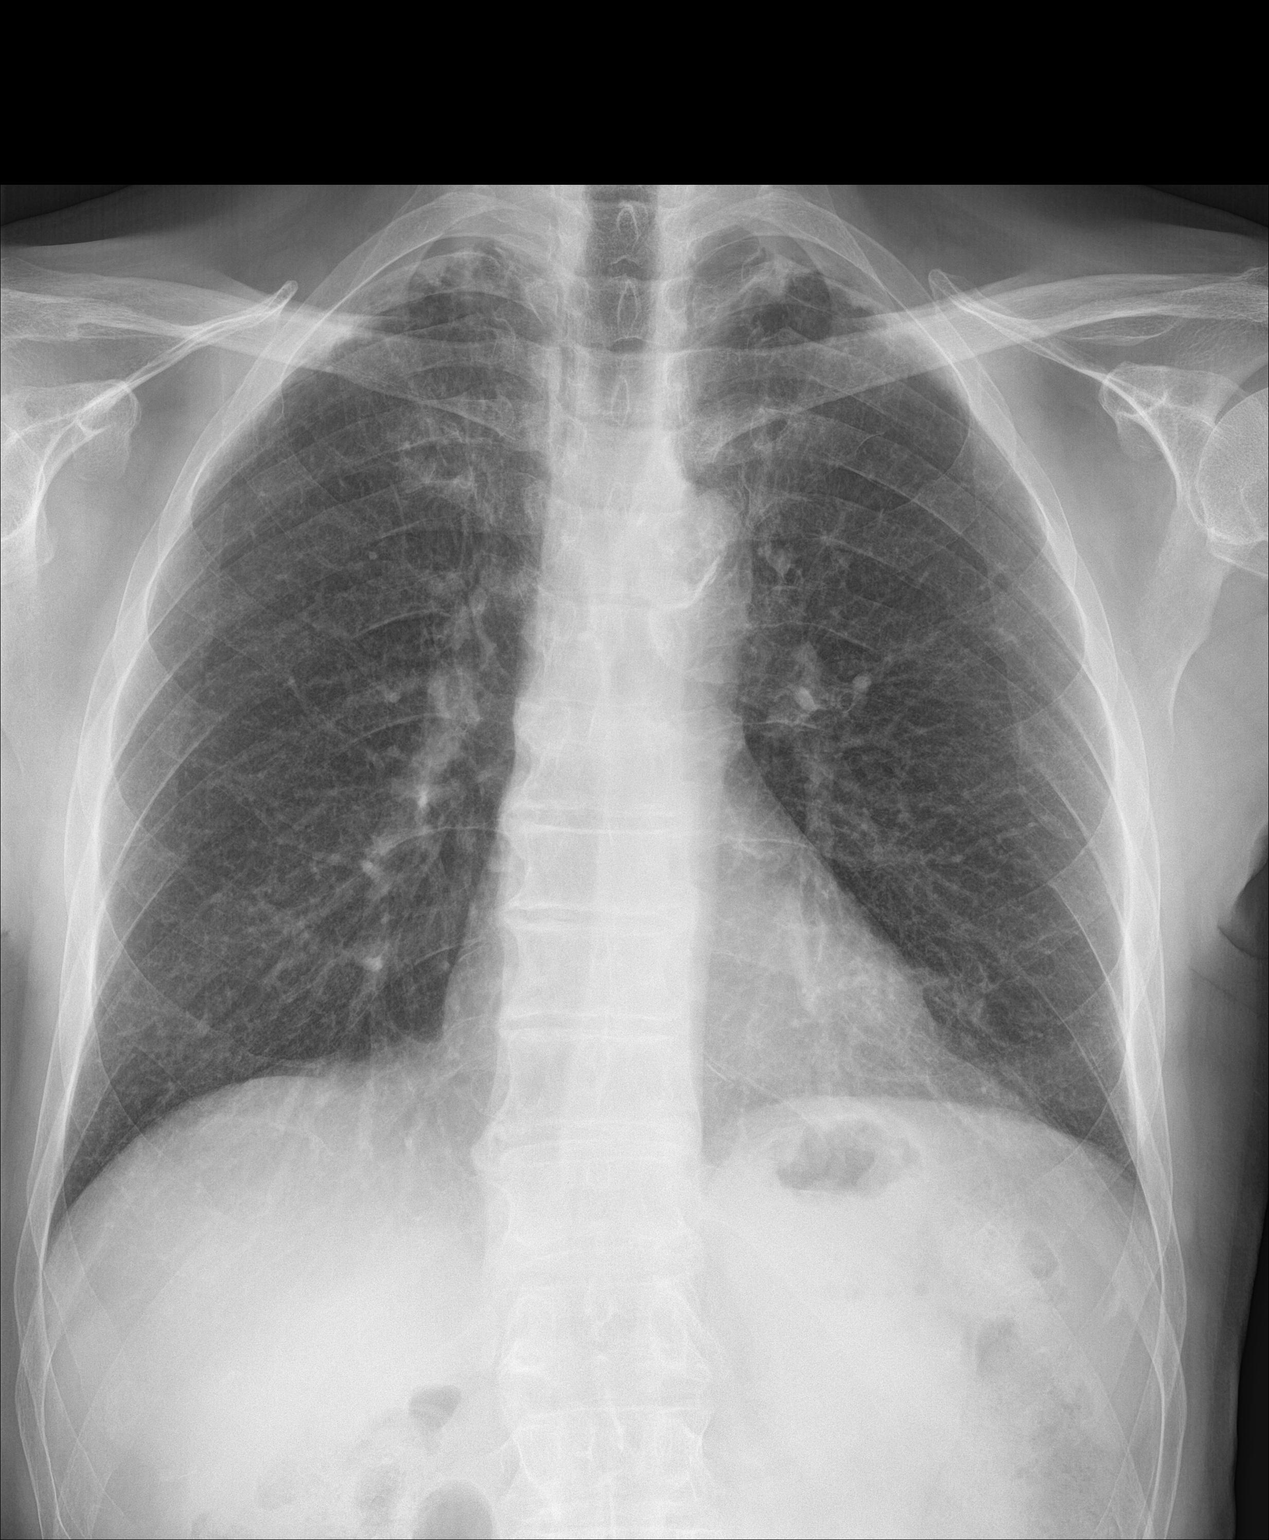

[chest lat]
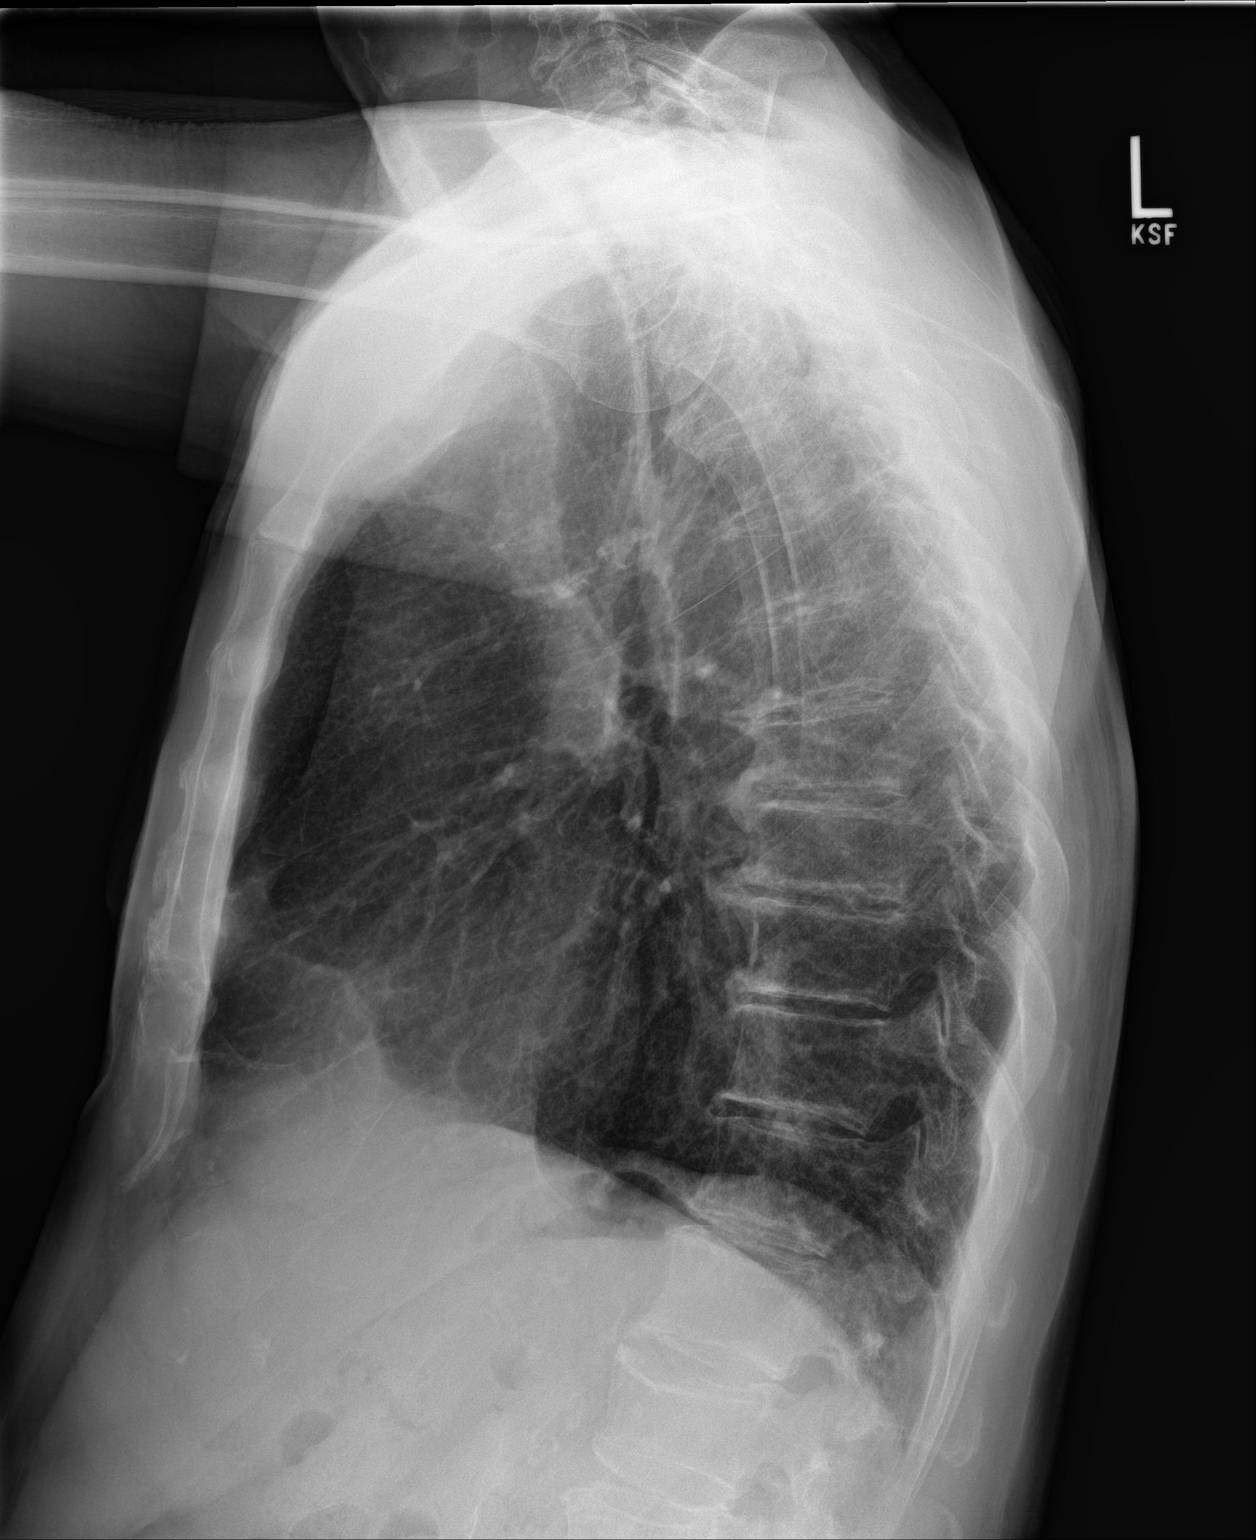

[2 of 2 positions shown; findings below may reference images not displayed]

FINDINGS: Normal heart size, mediastinal contours, and pulmonary vascularity.

Atherosclerotic calcification aorta.

Emphysematous and minimal bronchitic changes consistent with COPD.

Biapical scarring and upper lobe volume loss with superior
retraction of the hila.

No acute infiltrate, pleural effusion, or pneumothorax.

Scattered mild endplate spur formation thoracic spine with minimal
dextroconvex thoracolumbar scoliosis.
IMPRESSION: COPD changes with biapical scarring.

No acute abnormalities.

## 2019-08-12 ENCOUNTER — Ambulatory Visit
Admission: RE | Admit: 2019-08-12 | Discharge: 2019-08-12 | Disposition: A | Discharge status: Home / Self Care | Payer: Medicare HMO | Source: Ambulatory Visit | Attending: Pulmonary Disease | Admitting: Pulmonary Disease

## 2019-08-12 ENCOUNTER — Other Ambulatory Visit: Payer: Self-pay

## 2019-08-12 ENCOUNTER — Other Ambulatory Visit: Payer: Self-pay | Admitting: Pulmonary Disease

## 2019-08-12 DIAGNOSIS — J439 Emphysema, unspecified: Secondary | ICD-10-CM | POA: Diagnosis not present

## 2019-08-12 DIAGNOSIS — R911 Solitary pulmonary nodule: Secondary | ICD-10-CM | POA: Diagnosis not present

## 2019-08-12 DIAGNOSIS — R918 Other nonspecific abnormal finding of lung field: Secondary | ICD-10-CM

## 2019-08-14 ENCOUNTER — Encounter: Payer: Self-pay | Admitting: Adult Health

## 2019-08-14 ENCOUNTER — Other Ambulatory Visit: Payer: Self-pay

## 2019-08-14 ENCOUNTER — Ambulatory Visit: Payer: Medicare HMO | Admitting: Adult Health

## 2019-08-14 VITALS — BP 134/88 | HR 85 | Ht 70.0 in | Wt 156.4 lb

## 2019-08-14 DIAGNOSIS — Z87891 Personal history of nicotine dependence: Secondary | ICD-10-CM | POA: Diagnosis not present

## 2019-08-14 DIAGNOSIS — R911 Solitary pulmonary nodule: Secondary | ICD-10-CM

## 2019-08-14 DIAGNOSIS — Z122 Encounter for screening for malignant neoplasm of respiratory organs: Secondary | ICD-10-CM

## 2019-08-14 DIAGNOSIS — J449 Chronic obstructive pulmonary disease, unspecified: Secondary | ICD-10-CM | POA: Diagnosis not present

## 2019-08-14 DIAGNOSIS — Z720 Tobacco use: Secondary | ICD-10-CM | POA: Diagnosis not present

## 2019-08-14 NOTE — Assessment & Plan Note (Signed)
Emphysema noted on CT scan.  Check PFTs on return.  Patient has minimum symptoms with occasional cough.  He remains active with no significant shortness of breath except with heavy activity.

## 2019-08-14 NOTE — Assessment & Plan Note (Signed)
Follow-up CT chest shows stable lung nodule at left apical.  He does have a new possible lingular endobronchial soft tissue density versus mucoid impaction.  And stable right lower lobe endobronchial soft tissue density.  With patient's heavy smoking history will repeat CT chest in 3 months.

## 2019-08-14 NOTE — Progress Notes (Signed)
'@Patient'  ID: Jesus Bush, male    DOB: 1946-01-29, 74 y.o.   MRN: [id number]  Chief Complaint  Patient presents with  . 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 Biapical scarring. Mild centrilobular emphysema.   08/14/2019 Follow up: Lung nodule  Patient presents for a 76-monthfollow-up.  Patient was seen last visit for pulmonary consult with a notable left upper lobe pleural-based lung nodule measuring 6.5 mm.  Patient was set up for a follow-up CT chest that was done on 12/13/2019 that showed no mediastinal or hilar adenopathy.  Mild emphysema.  Biapical pleuroparenchymal scarring and change in left apical nodule at 6.4 mm, previous majority of areas of presumed mucoid impaction have resolved.  Persistent similar right lower lobe endobronchial soft tissue density measuring 3.4 mm favored to be a mucoid impaction.  New lingular area measuring 5.6 mm possible mucoid impaction, aortic and coronary artery atherosclerosis Incidental  findings of hiatal hernia, bilateral gynecomasty, sebaceous cyst, hepatic steatosis Since last visit patient says he is doing well.  Has occasional cough.  Patient was able to quit smoking he was prescribed Chantix last visit.  He says he has done well and has not smoked.  He was congratulated on his cessation success.   Allergies  Allergen Reactions  . Ppd [Tuberculin Purified Protein Derivative]     Positive PPD.    Immunization History  Administered Date(s) Administered  . DTaP 05/23/2006  . Influenza Whole 12/16/2012  . Influenza, High Dose Seasonal PF 01/17/2015, 12/13/2015, 01/29/2017, 12/17/2017,  01/14/2019  . Influenza-Unspecified 12/27/2013  . PFIZER SARS-COV-2 Vaccination 05/18/2019, 06/08/2019  . Pneumococcal Conjugate-13 04/13/2014  . Pneumococcal Polysaccharide-23 09/13/2008, 08/06/2016  . Td 08/06/2016  . Zoster 04/17/2013    Past Medical History:  Diagnosis Date  . [place]   . Hyperlipidemia   . Hypertension   . Vitamin D deficiency     Tobacco History: Social History   Tobacco Use  Smoking Status Former Smoker  . Packs/day: 1.50  . Years: 44.00  . Pack years: 66.00  . Types: Cigarettes  . Start date: 117 . Quit date: 06/14/2019  . Years since quitting: 0.1  Smokeless Tobacco Never Used   Counseling given: Not Answered   Outpatient Medications Prior to Visit  Medication Sig Dispense Refill  . ACCU-CHEK SOFTCLIX LANCETS lancets Check blood sugar 1 time daily-DX-R73.09 100 each 3  . albuterol (VENTOLIN HFA) 108 (90 Base) MCG/ACT inhaler 2 inhalations 15 minutes apart every 4 hours to Rescue Asthma 48 g 3  . Alcohol Swabs (B-D SINGLE USE SWABS REGULAR) PADS Check blood sugar 1 time daily-DX-R73.09. 100 each 3  . aspirin 81 MG tablet Take 81 mg by mouth daily.    . Blood Glucose Calibration (ACCU-CHEK AVIVA) SOLN Use per box instruction. 1 each 3  . Blood Glucose Monitoring Suppl (ACCU-CHEK AVIVA PLUS) w/Device KIT Check blood sugar 1 time daily-DX-R73.09 1 kit 0  . cetirizine (ZYRTEC ALLERGY) 10 MG tablet Take one tablet nightly for allergies. 90 tablet 1  . Cholecalciferol (VITAMIN D) 2000 UNITS CAPS Take 1 capsule by mouth daily.    .Marland Kitchen  DILT-XR 240 MG 24 hr capsule TAKE 1 CAPSULE DAILY FOR BLOOD PRESSURE AND HEART BEAT 90 capsule 1  . glucose blood (ACCU-CHEK AVIVA PLUS) test strip Check blood sugar 1 time daily-R73.09. 100 each 3  . losartan (COZAAR) 100 MG tablet Take 1 tablet Daily for BP 90 tablet 3  . magnesium gluconate (MAGONATE) 500 MG tablet Take 500 mg by mouth daily.     . promethazine-dextromethorphan  (PROMETHAZINE-DM) 6.25-15 MG/5ML syrup Take 1 to 2 tsp enery 4 hours if needed for cough 360 mL 1  . rosuvastatin (CRESTOR) 40 MG tablet Take 1 tablet Daily for Cholesterol 90 tablet 3  . varenicline (CHANTIX PAK) 0.5 MG X 11 & 1 MG X 42 tablet Take one 0.5 mg tablet by mouth once daily for 3 days, then increase to one 0.5 mg tablet twice daily for 4 days, then increase to one 1 mg tablet twice daily. 53 tablet 0   No facility-administered medications prior to visit.     Review of Systems:   Constitutional:   No  weight loss, night sweats,  Fevers, chills, fatigue, or  lassitude.  HEENT:   No headaches,  Difficulty swallowing,  Tooth/dental problems, or  Sore throat,                No sneezing, itching, ear ache, nasal congestion, post nasal drip,   CV:  No chest pain,  Orthopnea, PND, swelling in lower extremities, anasarca, dizziness, palpitations, syncope.   GI  No heartburn, indigestion, abdominal pain, nausea, vomiting, diarrhea, change in bowel habits, loss of appetite, bloody stools.   Resp: No shortness of breath with exertion or at rest.  No excess mucus, no productive cough,  No non-productive cough,  No coughing up of blood.  No change in color of mucus.  No wheezing.  No chest wall deformity  Skin: no rash or lesions.  GU: no dysuria, change in color of urine, no urgency or frequency.  No flank pain, no hematuria   MS:  No joint pain or swelling.  No decreased range of motion.  No back pain.    Physical Exam  BP 134/88 (BP Location: Left Arm, Cuff Size: Normal)   Pulse 85   Ht '5\' 10"'  (1.778 m)   Wt 156 lb 6.4 oz (70.9 kg)   SpO2 98%   BMI 22.44 kg/m   GEN: A/Ox3; pleasant , NAD, well nourished    HEENT:  [place]/AT,   NOSE-clear, THROAT-clear, no lesions, no postnasal drip or exudate noted.   NECK:  Supple w/ fair ROM; no JVD; normal carotid impulses w/o bruits; no thyromegaly or nodules palpated; no lymphadenopathy.    RESP  Clear  P & A; w/o, wheezes/ rales/ or  rhonchi. no accessory muscle use, no dullness to percussion  CARD:  RRR, no m/r/g, no peripheral edema, pulses intact, no cyanosis or clubbing.  GI:   Soft & nt; nml bowel sounds; no organomegaly or masses detected.   Musco: Warm bil, no deformities or joint swelling noted.   Neuro: alert, no focal deficits noted.    Skin: Warm, no lesions or rashes    Lab Results:  CBC  BNP No results found for: BNP  ProBNP No results found for: PROBNP  Imaging: CT CHEST LCS NODULE F/U W/O CONTRAST  Result Date: 08/12/2019 CLINICAL DATA:  Thirty-three pack-year smoking history, quitting in November. Six-month follow-up of a left apical pleural-based area of nodularity. EXAM: CT CHEST WITHOUT CONTRAST FOR LUNG CANCER SCREENING NODULE  FOLLOW-UP TECHNIQUE: Multidetector CT imaging of the chest was performed following the standard protocol without IV contrast. COMPARISON:  02/03/2019 FINDINGS: Cardiovascular: Aortic atherosclerosis. Normal heart size, without pericardial effusion. Multivessel coronary artery atherosclerosis. Mediastinum/Nodes: No mediastinal or definite hilar adenopathy, given limitations of unenhanced CT. Small to moderate hiatal hernia. Lungs/Pleura: No pleural fluid. Mild centrilobular and paraseptal emphysema. Biapical pleuroparenchymal scarring. The left apical pleuroparenchymal nodule of interest is similar, including at volume derived equivalent diameter 6.4 mm. The majority of areas of presumed mucoid impaction have resolved. There is a persistent, similar right lower lobe endobronchial soft tissue density which measures volume derived equivalent diameter 3.4 mm, similar on 222/5, favored to represent mucoid impaction. A lingular area of probable mucoid impaction is new, including at volume derived equivalent diameter 5.6 mm on 203/5. Upper Abdomen: Mild hepatic steatosis. Too small to characterize left hepatic lobe lesions are unchanged. Old granulomatous disease in the spleen.  Normal imaged portions of the pancreas, gallbladder, adrenal glands, kidneys. Abdominal aortic atherosclerosis. Musculoskeletal: Moderate bilateral gynecomastia. Probable sebaceous cyst about the left paracentral upper back at 9 mm on 06/03. Moderate thoracic spondylosis. IMPRESSION: 1. Lung-RADS 3, probably benign findings. Short-term follow-up in 6 months is recommended with repeat low-dose chest CT without contrast (please use the following order, "CT CHEST LCS NODULE FOLLOW-UP W/O CM"). Although the pleural-based nodule of interest in the left upper lobe is unchanged, there is a new lingular endobronchial density (favored to represent mucoid impaction) which is technically indeterminate. 2. Aortic atherosclerosis (ICD10-I70.0), coronary artery atherosclerosis and emphysema (ICD10-J43.9). 3. Gynecomastia. Electronically Signed   By: Abigail Miyamoto M.D.   On: 08/12/2019 15:47      No flowsheet data found.  No results found for: NITRICOXIDE      Assessment & Plan:   COPD (chronic obstructive pulmonary disease) (HCC) Emphysema noted on CT scan.  Check PFTs on return.  Patient has minimum symptoms with occasional cough.  He remains active with no significant shortness of breath except with heavy activity.  Nodule of left lung Follow-up CT chest shows stable lung nodule at left apical.  He does have a new possible lingular endobronchial soft tissue density versus mucoid impaction.  And stable right lower lobe endobronchial soft tissue density.  With patient's heavy smoking history will repeat CT chest in 3 months.     Rexene Edison, NP 08/14/2019

## 2019-08-14 NOTE — Patient Instructions (Addendum)
Great job not smoking , we are very proud of you .  CT chest in 3 months without contrast. -LDCT  Prilosec 20mg  daily for reflux , if not better follow up with Primary Provider.  Follow up with Primary MD regarding incidental findings on CT chest that we discussed.   Follow up with Dr. Loanne Drilling in 3 months with PFT and As needed

## 2019-08-31 DIAGNOSIS — H5201 Hypermetropia, right eye: Secondary | ICD-10-CM | POA: Diagnosis not present

## 2019-08-31 DIAGNOSIS — E119 Type 2 diabetes mellitus without complications: Secondary | ICD-10-CM | POA: Diagnosis not present

## 2019-08-31 DIAGNOSIS — Z961 Presence of intraocular lens: Secondary | ICD-10-CM | POA: Diagnosis not present

## 2019-08-31 DIAGNOSIS — I10 Essential (primary) hypertension: Secondary | ICD-10-CM | POA: Diagnosis not present

## 2019-08-31 DIAGNOSIS — Z7984 Long term (current) use of oral hypoglycemic drugs: Secondary | ICD-10-CM | POA: Diagnosis not present

## 2019-08-31 DIAGNOSIS — H52223 Regular astigmatism, bilateral: Secondary | ICD-10-CM | POA: Diagnosis not present

## 2019-08-31 DIAGNOSIS — H524 Presbyopia: Secondary | ICD-10-CM | POA: Diagnosis not present

## 2019-08-31 DIAGNOSIS — D3131 Benign neoplasm of right choroid: Secondary | ICD-10-CM | POA: Diagnosis not present

## 2019-08-31 DIAGNOSIS — H26492 Other secondary cataract, left eye: Secondary | ICD-10-CM | POA: Diagnosis not present

## 2019-08-31 LAB — HM DIABETES EYE EXAM

## 2019-09-16 ENCOUNTER — Encounter: Payer: Self-pay | Admitting: *Deleted

## 2019-10-26 ENCOUNTER — Other Ambulatory Visit: Payer: Self-pay | Admitting: Adult Health

## 2019-10-28 ENCOUNTER — Ambulatory Visit: Payer: Medicare HMO

## 2019-11-08 NOTE — Progress Notes (Signed)
Annual  Screening/Preventative Visit  & Comprehensive Evaluation & Examination     This very nice 74 y.o.  MWM presents for a Screening /Preventative Visit & comprehensive evaluation and management of multiple medical co-morbidities.  Patient has been followed for HTN, HLD, T2_NIDDM, Occult Hyperthyroidism, COPD and Vitamin D Deficiency. Patient, a 33+ yr smoker,  is also followed by Pulmonary for a LUL nodule discovered with screening LD Chest CT  In 2020. Patient has prior hx/o GERD and has recently had increasing reflux sx's and ha had relief taking his wife's Pepcid40 mg.     HTN predates circa 2005. Patient's BP has been controlled at home.  Today's BP is at goal at 106/52. Patient denies any cardiac symptoms as chest pain, palpitations, shortness of breath, dizziness or ankle swelling.     Patient's hyperlipidemia is near controlled with diet and Rosuvastatin. Patient denies myalgias or other medication SE's. Last lipids were near goal with elevated Trig's:  Lab Results  Component Value Date   CHOL 164 07/16/2019   HDL 38 (L) 07/16/2019   LDLCALC 99 07/16/2019   TRIG 169 (H) 07/16/2019   CHOLHDL 4.3 07/16/2019       Patient has hx/o T2_NIDDM  (A1c 7.5% / 2010)  and was treated with MF til Aug 2018 and has since controlled with diet.  Patient has  Diabetic CKD2 (GFR 77.   Patient denies reactive hypoglycemic symptoms, visual blurring, diabetic polys or paresthesias. Last A1c was near goal:  Lab Results  Component Value Date   HGBA1C 5.9 (H) 07/16/2019        Patient is followed for Occult or sub-clinical Hyperthyroidism (2019-2020).     Finally, patient has history of Vitamin D Deficiency ("23"/ 2008)  and last vitamin D was at goal:  Lab Results  Component Value Date   VD25OH 64 04/17/2019    Current Outpatient Medications on File Prior to Visit  Medication Sig  . ACCU-CHEK SOFTCLIX LANCETS lancets Check blood sugar 1 time daily-DX-R73.09  . albuterol (VENTOLIN HFA) 108  (90 Base) MCG/ACT inhaler 2 inhalations 15 minutes apart every 4 hours to Rescue Asthma  . Alcohol Swabs (B-D SINGLE USE SWABS REGULAR) PADS Check blood sugar 1 time daily-DX-R73.09.  Marland Kitchen aspirin 81 MG tablet Take 81 mg by mouth daily.  . Blood Glucose Calibration (ACCU-CHEK AVIVA) SOLN Use per box instruction.  . Blood Glucose Monitoring Suppl (ACCU-CHEK AVIVA PLUS) w/Device KIT Check blood sugar 1 time daily-DX-R73.09  . cetirizine (ZYRTEC ALLERGY) 10 MG tablet Take one tablet nightly for allergies.  . Cholecalciferol (VITAMIN D) 2000 UNITS CAPS Take 2 capsules by mouth daily.   Marland Kitchen DILT-XR 240 MG 24 hr capsule TAKE 1 CAPSULE DAILY FOR BLOOD PRESSURE AND HEART BEAT  . glucose blood (ACCU-CHEK AVIVA PLUS) test strip Check blood sugar 1 time daily-R73.09.  . losartan (COZAAR) 100 MG tablet Take 1 tablet Daily for BP  . magnesium gluconate (MAGONATE) 500 MG tablet Take 500 mg by mouth daily.   . promethazine-dextromethorphan (PROMETHAZINE-DM) 6.25-15 MG/5ML syrup Take 1 to 2 tsp enery 4 hours if needed for cough  . rosuvastatin (CRESTOR) 40 MG tablet Take 1 tablet Daily for Cholesterol   No current facility-administered medications on file prior to visit.   Allergies  Allergen Reactions  . Ppd [Tuberculin Purified Protein Derivative]     Positive PPD.   Past Medical History:  Diagnosis Date  . Diabetes mellitus without complication ([place])   . Hyperlipidemia   . Hypertension   .  Vitamin D deficiency    Health Maintenance  Topic Date Due  . INFLUENZA VACCINE  10/25/2019  . Hepatitis C Screening  01/25/2020 (Originally [birth year])  . Fecal DNA (Cologuard)  09/18/2020  . TETANUS/TDAP  08/07/2026  . COVID-19 Vaccine  Completed  . PNA vac Low Risk Adult  Completed   Immunization History  Administered Date(s) Administered  . DTaP 05/23/2006  . Influenza Whole 12/16/2012  . Influenza, High Dose Seasonal PF 01/17/2015, 12/13/2015, 01/29/2017, 12/17/2017, 01/14/2019  . Influenza-Unspecified  12/27/2013  . PFIZER SARS-COV-2 Vaccination 05/18/2019, 06/08/2019  . Pneumococcal Conjugate-13 04/13/2014  . Pneumococcal Polysaccharide-23 09/13/2008, 08/06/2016  . Td 08/06/2016  . Zoster 04/17/2013   Last Colon - 08/15/2007 - Dr Deatra Ina - recc 10 yr f/u - overdue -   - Dr Loletha Carrow sent a recall letter 07/17/2017  Past Surgical History:  Procedure Laterality Date  . CATARACT EXTRACTION, BILATERAL Bilateral 2018   Dr. Coral Ceo   Family History  Problem Relation Age of Onset  . Heart attack Mother   . Heart disease Mother   . Heart attack Brother   . Hypertension Brother   . Diabetes Sister   . Heart disease Sister   . Heart disease Father   . Heart attack Father    Social History   Socioeconomic History  . Marital status: Married    Spouse name: Not on file  . Number of children: 2  Occupational History  . Occupation: Retired Futures trader  Tobacco Use  . Smoking status: Former Smoker    Packs/day: 1.50    Years: 44.00    Pack years: 66.00    Types: Cigarettes    Start date: 3    Quit date: 06/14/2019    Years since quitting: 0.4  . Smokeless tobacco: Never Used  Substance and Sexual Activity  . Alcohol use: No    Alcohol/week: 0.0 standard drinks    Comment: He states he no longer drinks alcohol  . Drug use: No  . Sexual activity: Not on file     ROS Constitutional: Denies fever, chills, weight loss/gain, headaches, insomnia,  night sweats or change in appetite. Does c/o fatigue. Eyes: Denies redness, blurred vision, diplopia, discharge, itchy or watery eyes.  ENT: Denies discharge, congestion, post nasal drip, epistaxis, sore throat, earache, hearing loss, dental pain, Tinnitus, Vertigo, Sinus pain or snoring.  Cardio: Denies chest pain, palpitations, irregular heartbeat, syncope, dyspnea, diaphoresis, orthopnea, PND, claudication or edema Respiratory: denies cough, dyspnea, DOE, pleurisy, hoarseness, laryngitis or wheezing.  Gastrointestinal:  Denies dysphagia, heartburn, reflux, water brash, pain, cramps, nausea, vomiting, bloating, diarrhea, constipation, hematemesis, melena, hematochezia, jaundice or hemorrhoids Genitourinary: Denies dysuria, frequency,  discharge, hematuria or flank pain. Has urgency, nocturia x 2-3 & occasional hesitancy. Musculoskeletal: Denies arthralgia, myalgia, stiffness, Jt. Swelling, pain, limp or strain/sprain. Denies Falls. Skin: Denies puritis, rash, hives, warts, acne, eczema or change in skin lesion Neuro: No weakness, tremor, incoordination, spasms, paresthesia or pain Psychiatric: Denies confusion, memory loss or sensory loss. Denies Depression. Endocrine: Denies change in weight, skin, hair change, nocturia, and paresthesia, diabetic polys, visual blurring or hyper / hypo glycemic episodes.  Heme/Lymph: No excessive bleeding, bruising or enlarged lymph nodes.  Physical Exam  BP (!) 106/52   Pulse 64   Temp (!) 97 F (36.1 C)   Resp 16   Ht '5\' 10"'  (1.778 m)   Wt 157 lb (71.2 kg)   BMI 22.53 kg/m   General Appearance: Well nourished and well groomed and in no apparent  distress.  Eyes: PERRLA, EOMs, conjunctiva no swelling or erythema, normal fundi and vessels. Sinuses: No frontal/maxillary tenderness ENT/Mouth: EACs patent / TMs  nl. Nares clear without erythema, swelling, mucoid exudates. Oral hygiene is good. No erythema, swelling, or exudate. Tongue normal, non-obstructing. Tonsils not swollen or erythematous. Hearing normal.  Neck: Supple, thyroid not palpable. No bruits, nodes or JVD. Respiratory: Respiratory effort normal.  BS equal and clear bilateral without rales, rhonci, wheezing or stridor. Cardio: Heart sounds are normal with regular rate and rhythm and no murmurs, rubs or gallops. Peripheral pulses are normal and equal bilaterally without edema. No aortic or femoral bruits. Chest: symmetric with normal excursions and percussion.  Abdomen: Soft, with Nl bowel sounds. Nontender,  no guarding, rebound, hernias, masses, or organomegaly.  Lymphatics: Non tender without lymphadenopathy.  Musculoskeletal: Full ROM all peripheral extremities, joint stability, 5/5 strength, and normal gait. Skin: Warm and dry without rashes, lesions, cyanosis, clubbing or  ecchymosis.  Neuro: Cranial nerves intact, reflexes equal bilaterally. Normal muscle tone, no cerebellar symptoms. Sensation intact.  Pysch: Alert and oriented X 3 with normal affect, insight and judgment appropriate.   Assessment and Plan  1. Annual Preventative/Screening Exam   2. Essential hypertension  - EKG 12-Lead - [place], RETROPERITNL ABD,  LTD - Urinalysis, Routine w reflex microscopic - Microalbumin / creatinine urine ratio - Lipid panel - TSH  3. Hyperlipidemia associated with type 2 diabetes mellitus ([place])  - EKG 12-Lead - [place], RETROPERITNL ABD,  LTD  4. Type 2 diabetes mellitus with stage 2 chronic kidney disease,  without long-term current use of insulin (HCC)  - EKG 12-Lead - [place], RETROPERITNL ABD,  LTD - HM DIABETES FOOT EXAM - LOW EXTREMITY NEUR EXAM DOCUM - Hemoglobin A1c - Insulin, random  5. Vitamin D deficiency  - VITAMIN D 25 Hydroxy  6. Gastroesophageal reflux disease  - CBC with Differential/Platelet  7. Chronic obstructive pulmonary disease  ([place])  8. Hyperthyroidism, subclinical  - TSH  9. BPH with urinary obstruction  - Urinalysis, Routine w reflex microscopic - PSA  10. Prostate cancer screening  - PSA  11. Screening for ischemic heart disease  - EKG 12-Lead  12. FHx: heart disease  - EKG 12-Lead - [place], RETROPERITNL ABD,  LTD  13. Former smoker  - EKG 12-Lead - [place], RETROPERITNL ABD,  LTD  14. Aortic atherosclerosis (HCC)  - [place], RETROPERITNL ABD,  LTD  15. Screening for AAA (aortic abdominal aneurysm)  - [place], RETROPERITNL ABD,  LTD  16. Medication management  - Urinalysis, Routine w reflex microscopic - Microalbumin / creatinine urine ratio -  CBC with Differential/Platelet - COMPLETE METABOLIC PANEL WITH GFR - Magnesium - Lipid panel - TSH - Hemoglobin A1c - Insulin, random - VITAMIN D 25 Hydroxy  17. Screening for colorectal cancer  - POC Hemoccult Bld/Stl  - Declines referral for Colonoscopy.        Patient was counseled in prudent diet, weight control to achieve/maintain BMI less than 25, BP monitoring, regular exercise and medications as discussed.  Discussed med effects and SE's. Routine screening labs and tests as requested with regular follow-up as recommended. Over 40 minutes of exam, counseling, chart review and high complex critical decision making was performed   Kirtland Bouchard, MD

## 2019-11-08 NOTE — Patient Instructions (Signed)

## 2019-11-09 ENCOUNTER — Ambulatory Visit (INDEPENDENT_AMBULATORY_CARE_PROVIDER_SITE_OTHER): Payer: Medicare HMO | Admitting: Internal Medicine

## 2019-11-09 ENCOUNTER — Other Ambulatory Visit: Payer: Self-pay

## 2019-11-09 ENCOUNTER — Encounter: Payer: Self-pay | Admitting: Internal Medicine

## 2019-11-09 VITALS — BP 106/52 | HR 64 | Temp 97.0°F | Resp 16 | Ht 70.0 in | Wt 157.0 lb

## 2019-11-09 DIAGNOSIS — E1169 Type 2 diabetes mellitus with other specified complication: Secondary | ICD-10-CM

## 2019-11-09 DIAGNOSIS — N401 Enlarged prostate with lower urinary tract symptoms: Secondary | ICD-10-CM | POA: Diagnosis not present

## 2019-11-09 DIAGNOSIS — Z0001 Encounter for general adult medical examination with abnormal findings: Secondary | ICD-10-CM | POA: Diagnosis not present

## 2019-11-09 DIAGNOSIS — Z125 Encounter for screening for malignant neoplasm of prostate: Secondary | ICD-10-CM | POA: Diagnosis not present

## 2019-11-09 DIAGNOSIS — E0590 Thyrotoxicosis, unspecified without thyrotoxic crisis or storm: Secondary | ICD-10-CM | POA: Diagnosis not present

## 2019-11-09 DIAGNOSIS — Z8249 Family history of ischemic heart disease and other diseases of the circulatory system: Secondary | ICD-10-CM

## 2019-11-09 DIAGNOSIS — N138 Other obstructive and reflux uropathy: Secondary | ICD-10-CM | POA: Diagnosis not present

## 2019-11-09 DIAGNOSIS — N182 Chronic kidney disease, stage 2 (mild): Secondary | ICD-10-CM | POA: Diagnosis not present

## 2019-11-09 DIAGNOSIS — E559 Vitamin D deficiency, unspecified: Secondary | ICD-10-CM | POA: Diagnosis not present

## 2019-11-09 DIAGNOSIS — Z87891 Personal history of nicotine dependence: Secondary | ICD-10-CM

## 2019-11-09 DIAGNOSIS — I700 Atherosclerosis of aorta: Secondary | ICD-10-CM | POA: Diagnosis not present

## 2019-11-09 DIAGNOSIS — I10 Essential (primary) hypertension: Secondary | ICD-10-CM | POA: Diagnosis not present

## 2019-11-09 DIAGNOSIS — E1122 Type 2 diabetes mellitus with diabetic chronic kidney disease: Secondary | ICD-10-CM

## 2019-11-09 DIAGNOSIS — J449 Chronic obstructive pulmonary disease, unspecified: Secondary | ICD-10-CM

## 2019-11-09 DIAGNOSIS — K219 Gastro-esophageal reflux disease without esophagitis: Secondary | ICD-10-CM

## 2019-11-09 DIAGNOSIS — Z79899 Other long term (current) drug therapy: Secondary | ICD-10-CM

## 2019-11-09 DIAGNOSIS — Z1211 Encounter for screening for malignant neoplasm of colon: Secondary | ICD-10-CM

## 2019-11-09 DIAGNOSIS — Z136 Encounter for screening for cardiovascular disorders: Secondary | ICD-10-CM

## 2019-11-09 MED ORDER — FAMOTIDINE 40 MG PO TABS: ORAL_TABLET | ORAL | 3 refills | Status: DC

## 2019-11-10 DIAGNOSIS — H26493 Other secondary cataract, bilateral: Secondary | ICD-10-CM | POA: Diagnosis not present

## 2019-11-10 DIAGNOSIS — H5989 Other postprocedural complications and disorders of eye and adnexa, not elsewhere classified: Secondary | ICD-10-CM | POA: Diagnosis not present

## 2019-11-10 DIAGNOSIS — Z961 Presence of intraocular lens: Secondary | ICD-10-CM | POA: Diagnosis not present

## 2019-11-10 DIAGNOSIS — H40013 Open angle with borderline findings, low risk, bilateral: Secondary | ICD-10-CM | POA: Diagnosis not present

## 2019-11-10 DIAGNOSIS — H18413 Arcus senilis, bilateral: Secondary | ICD-10-CM | POA: Diagnosis not present

## 2019-11-10 DIAGNOSIS — H52223 Regular astigmatism, bilateral: Secondary | ICD-10-CM | POA: Diagnosis not present

## 2019-11-10 DIAGNOSIS — H26492 Other secondary cataract, left eye: Secondary | ICD-10-CM | POA: Diagnosis not present

## 2019-11-10 DIAGNOSIS — H5203 Hypermetropia, bilateral: Secondary | ICD-10-CM | POA: Diagnosis not present

## 2019-11-10 DIAGNOSIS — H524 Presbyopia: Secondary | ICD-10-CM | POA: Diagnosis not present

## 2019-11-10 DIAGNOSIS — Z9842 Cataract extraction status, left eye: Secondary | ICD-10-CM | POA: Diagnosis not present

## 2019-11-10 LAB — URINALYSIS, ROUTINE W REFLEX MICROSCOPIC
Bilirubin Urine: NEGATIVE
Glucose, UA: NEGATIVE
Hgb urine dipstick: NEGATIVE
Leukocytes,Ua: NEGATIVE
Nitrite: NEGATIVE
Protein, ur: NEGATIVE
Specific Gravity, Urine: 1.019 (ref 1.001–1.03)
pH: 6 (ref 5.0–8.0)

## 2019-11-10 LAB — COMPLETE METABOLIC PANEL WITH GFR
AG Ratio: 1.6 (calc) (ref 1.0–2.5)
ALT: 22 U/L (ref 9–46)
AST: 18 U/L (ref 10–35)
Albumin: 4 g/dL (ref 3.6–5.1)
Alkaline phosphatase (APISO): 82 U/L (ref 35–144)
BUN/Creatinine Ratio: 13 (calc) (ref 6–22)
BUN: 17 mg/dL (ref 7–25)
CO2: 29 mmol/L (ref 20–32)
Calcium: 10.1 mg/dL (ref 8.6–10.3)
Chloride: 101 mmol/L (ref 98–110)
Creat: 1.28 mg/dL — ABNORMAL HIGH (ref 0.70–1.18)
GFR, Est African American: 63 mL/min/{1.73_m2} (ref >=60)
GFR, Est Non African American: 55 mL/min/{1.73_m2} — ABNORMAL LOW (ref >=60)
Globulin: 2.5 g/dL (calc) (ref 1.9–3.7)
Glucose, Bld: 117 mg/dL — ABNORMAL HIGH (ref 65–99)
Potassium: 4 mmol/L (ref 3.5–5.3)
Sodium: 138 mmol/L (ref 135–146)
Total Bilirubin: 0.5 mg/dL (ref 0.2–1.2)
Total Protein: 6.5 g/dL (ref 6.1–8.1)

## 2019-11-10 LAB — MICROALBUMIN / CREATININE URINE RATIO
Creatinine, Urine: 229 mg/dL (ref 20–320)
Microalb Creat Ratio: 7 mcg/mg creat (ref <30)
Microalb, Ur: 1.5 mg/dL

## 2019-11-10 LAB — LIPID PANEL
Cholesterol: 167 mg/dL (ref <200)
HDL: 33 mg/dL — ABNORMAL LOW (ref >=40)
LDL Cholesterol (Calc): 92 mg/dL (calc)
Non-HDL Cholesterol (Calc): 134 mg/dL (calc) — ABNORMAL HIGH (ref <130)
Total CHOL/HDL Ratio: 5.1 (calc) — ABNORMAL HIGH (ref <5.0)
Triglycerides: 291 mg/dL — ABNORMAL HIGH (ref <150)

## 2019-11-10 LAB — INSULIN, RANDOM: Insulin: 29.7 u[IU]/mL — ABNORMAL HIGH

## 2019-11-10 LAB — CBC WITH DIFFERENTIAL/PLATELET
Absolute Monocytes: 612 cells/uL (ref 200–950)
Basophils Absolute: 58 cells/uL (ref 0–200)
Basophils Relative: 0.8 %
Eosinophils Absolute: 122 cells/uL (ref 15–500)
Eosinophils Relative: 1.7 %
HCT: 42.2 % (ref 38.5–50.0)
Hemoglobin: 14.4 g/dL (ref 13.2–17.1)
Lymphs Abs: 1598 cells/uL (ref 850–3900)
MCH: 32.7 pg (ref 27.0–33.0)
MCHC: 34.1 g/dL (ref 32.0–36.0)
MCV: 95.9 fL (ref 80.0–100.0)
MPV: 11.4 fL (ref 7.5–12.5)
Monocytes Relative: 8.5 %
Neutro Abs: 4810 cells/uL (ref 1500–7800)
Neutrophils Relative %: 66.8 %
Platelets: 266 10*3/uL (ref 140–400)
RBC: 4.4 10*6/uL (ref 4.20–5.80)
RDW: 12.9 % (ref 11.0–15.0)
Total Lymphocyte: 22.2 %
WBC: 7.2 10*3/uL (ref 3.8–10.8)

## 2019-11-10 LAB — HEMOGLOBIN A1C
Hgb A1c MFr Bld: 5.9 % of total Hgb — ABNORMAL HIGH (ref <5.7)
Mean Plasma Glucose: 123 (calc)
eAG (mmol/L): 6.8 (calc)

## 2019-11-10 LAB — PSA: PSA: 2.2 ng/mL (ref <=4.0)

## 2019-11-10 LAB — TSH: TSH: 0.52 mIU/L (ref 0.40–4.50)

## 2019-11-10 LAB — VITAMIN D 25 HYDROXY (VIT D DEFICIENCY, FRACTURES): Vit D, 25-Hydroxy: 48 ng/mL (ref 30–100)

## 2019-11-10 LAB — MAGNESIUM: Magnesium: 1.9 mg/dL (ref 1.5–2.5)

## 2019-11-10 NOTE — Progress Notes (Signed)
===========================================================  -    PSA - Low - great  ===========================================================  -  Kidney functions look a little Dehydrated - Very important to drink adequate amount of water / fluids - at least 5-6 bottles (16 oz)  /day to Prevent Permanent Kidney Damage.  ===========================================================  -  Total Chol = 167 and LDL Chol = 92 - Both  Excellent   - Very low risk for Heart Attack  / Stroke =============================================================  - But Triglycerides (   291   ) or fats in blood are too high  (goal is less than 150)    - Recommend avoid fried & greasy foods,  sweets / candy,   - Avoid white rice  (brown or wild rice or Quinoa is OK),   - Avoid white potatoes  (sweet potatoes are OK)   - Avoid anything made from white flour  - bagels, doughnuts, rolls, buns, biscuits, white and   wheat breads, pizza crust and traditional  pasta made of white flour & egg white  - (vegetarian pasta or spinach or wheat pasta is OK).    - Multi-grain bread is OK - like multi-grain flat bread or  sandwich thins.   - Avoid alcohol in excess.   - Exercise is also important. ===========================================================  -  A1c = 5.9% - still borderline elevated glucose  - So..............  - Avoid Sweets, Candy & White Stuff   - Rice, Potatoes, Breads &  Pasta ===========================================================  -  Vitamin D = 48 - still low   - Vitamin D goal is between 70-100.   - Please INCREASE your Vitamin D up to  10,000 units / Daily   - It is very important as a natural anti-inflammatory and  helping the immune system protect against viral  infections, like the Covid-19    helping hair, skin, and nails, as well as reducing stroke and  heart attack risk.   - It helps your bones and helps with mood.  - It also decreases numerous cancer  risks so please take  it as directed.   - Low Vit D is associated with a 200-300% higher risk for CANCER   and 200-300% higher risk for HEART   ATTACK  &  STROKE.    - It is also associated with higher death rate at younger ages,   autoimmune diseases like Rheumatoid arthritis, Lupus,  Multiple Sclerosis.     - Also many other serious conditions, like depression, Alzheimer's  Dementia, infertility, muscle aches, fatigue, fibromyalgia - just to name a few.  ==========================================================  - All Else - CBC - Kidneys - Electrolytes - Liver - Magnesium & Thyroid    - all  Normal / OK ==========================================================

## 2019-11-13 ENCOUNTER — Ambulatory Visit: Payer: Medicare HMO

## 2019-11-27 ENCOUNTER — Inpatient Hospital Stay: Admission: RE | Admit: 2019-11-27 | Payer: Medicare HMO | Source: Ambulatory Visit

## 2019-12-03 DIAGNOSIS — H26491 Other secondary cataract, right eye: Secondary | ICD-10-CM | POA: Diagnosis not present

## 2020-01-11 ENCOUNTER — Other Ambulatory Visit: Payer: Self-pay | Admitting: Adult Health

## 2020-01-27 ENCOUNTER — Ambulatory Visit (INDEPENDENT_AMBULATORY_CARE_PROVIDER_SITE_OTHER): Payer: Medicare HMO | Admitting: Internal Medicine

## 2020-01-27 ENCOUNTER — Other Ambulatory Visit: Payer: Self-pay

## 2020-01-27 VITALS — BP 122/64 | HR 91 | Temp 97.0°F | Resp 16 | Ht 70.0 in | Wt 156.6 lb

## 2020-01-27 DIAGNOSIS — Z23 Encounter for immunization: Secondary | ICD-10-CM

## 2020-01-27 DIAGNOSIS — H6122 Impacted cerumen, left ear: Secondary | ICD-10-CM

## 2020-01-27 DIAGNOSIS — H60312 Diffuse otitis externa, left ear: Secondary | ICD-10-CM | POA: Diagnosis not present

## 2020-01-27 MED ORDER — NEOMYCIN-POLYMYXIN-HC 3.5-10000-1 OT SUSP: OTIC | 1 refills | Status: DC

## 2020-01-27 NOTE — Progress Notes (Signed)
   History of Present Illness:    Patient is a very nice 74 yo MWM present ing acutely with c/o Left ear pain and difficulty hearing.  Medications  .  DILT-XR 240 MG , TAKE 1 CAPSULE DAILY  .  losartan 100 MG tablet, Take 1 tablet Daily for BP .  rosuvastatin (40 MG tablet, Take 1 tablet Daily for Cholesterol .  albuterol HFA inhaler, 2 inhalations 15 minutes apart every 4 hours to Rescue Asthma .  cetirizine  10 MG tablet, Take one tablet nightly for allergies. Marland Kitchen  aspirin 81 MG tablet, Take  daily. Marland Kitchen  VITAMIN D 2000 UNITS CAPS, Take 2 capsules  daily.  .  famotidine  40 MG tablet, Take 1 tablet daily  .  magnesium gluconate  500 MG tablet, Take 500 mg by mouth daily.   Problem list He has Abnormal glucose (prediabetes); Essential hypertension; Mixed hyperlipidemia; Vitamin D deficiency; Medication management; Tobacco use disorder; Esophageal reflux; COPD (chronic obstructive pulmonary disease) ([place]); Aortic atherosclerosis ([place]); Hyperthyroidism; and Nodule of left lung on their problem list.   Observations/Objective:  BP 122/64   Pulse 91   Temp (!) 97 F (36.1 C)   Resp 16   Ht 5\' 10"  (1.778 m)   Wt 156 lb 9.6 oz (71 kg)   SpO2 97%   BMI 22.47 kg/m   HEENT   -  Rt.  EAC  & TM Nl  _ Lt.  EAC impacted  W/cerumen. The Lt EAC is moderately inflamed. After irrigation, hearing  is restored.  Procedure (CPT [id number] & [phone])  After 4 instillations of Debrox and 4 separate 6 oz irrigations with warm water and then mechanical removal of chunks of dark brown wax, the EAC was cleared to reveal a nl Lt TM.    Assessment and Plan:  1. Acute diffuse otitis externa of left ear  - Neomycin-Polymyxin-Hydrocortisone (CORTISPORIN) 3.5-10000-1 OTIC suspension; Instill      6-8 drops      to Left ear   3 to 4 x /day     & occlude with 1/2 cotton ball  Dispense: 10 mL; Refill: 1  2. Impacted cerumen of left ear  - Irrigated  3. Hearing loss of left ear due to cerumen impaction   4.  Need for immunization against influenza  - Flu vaccine HIGH DOSE PF (Fluzone High dose)   Follow Up Instructions:       I discussed the assessment and treatment plan with the patient. The patient was provided an opportunity to ask questions and all were answered. The patient agreed with the plan and demonstrated an understanding of the instructions. Between 15-20 minutes of exam, counseling and procedure time was performed.   Kirtland Bouchard, MD

## 2020-01-27 NOTE — Patient Instructions (Signed)

## 2020-01-30 ENCOUNTER — Encounter: Payer: Self-pay | Admitting: Internal Medicine

## 2020-02-04 ENCOUNTER — Other Ambulatory Visit: Payer: Self-pay | Admitting: Internal Medicine

## 2020-02-04 DIAGNOSIS — K219 Gastro-esophageal reflux disease without esophagitis: Secondary | ICD-10-CM

## 2020-02-04 MED ORDER — FAMOTIDINE 40 MG PO TABS: ORAL_TABLET | ORAL | 0 refills | Status: DC

## 2020-02-05 ENCOUNTER — Other Ambulatory Visit: Payer: Self-pay | Admitting: Adult Health

## 2020-02-09 ENCOUNTER — Inpatient Hospital Stay: Admission: RE | Admit: 2020-02-09 | Payer: Medicare HMO | Source: Ambulatory Visit

## 2020-02-11 NOTE — Progress Notes (Signed)
FOLLOW UP  Assessment and Plan:   Atherosclerosis of aorta By imaging;  Advised he stop smoking Control blood pressure, cholesterol, glucose, increase exercise.   COPD Typically fairly controlled by PRN albuterol and trigger control (allergy meds) , however question current flare, given breztri samples Previously has declined daily inhaler as much improved since quitting smoking  Hypertension Well controlled with current medications  Monitor blood pressure at home; patient to call if consistently greater than 130/80 Continue DASH diet.   Reminder to go to the ER if any CP, SOB, nausea, dizziness, severe HA, changes vision/speech, left arm numbness and tingling and jaw pain.  Cholesterol Currently at goal by statin; consider switch to rosuvastatin 40 mg if LDL trending up Continue low cholesterol diet and exercise.  Check lipid panel.   Prediabetes Continue diet and exercise.  Perform daily foot/skin check, notify office of any concerning changes.  Check A1C q104m monitor weight, serum glucose  Acid reflux Well managed on current medications, has PPI but using PRN rarely Discussed diet, avoiding triggers and other lifestyle changes  Normal weight (BMI 22) Continue to recommend diet heavy in fruits and veggies and low in animal meats, cheeses, and dairy products, appropriate calorie intake Discuss exercise recommendations routinely Continue to monitor weight at each visit  Vitamin D Def Near goal at last visit; continue supplementation  Defer Vit D level  Tobacco use (30+ pack year history)/plum nodules Commended recent cessation He is following with pulm for nodules; follow up planned 02/26/2020 - may want to postpone this if CXR today shows pneumonia, will forward results to pulm  URI/productive cough/abnormal lung sounds COPD flare vs r/o pneumonia; had negative covid 165yesterday  Pending CXR, will initiate zpak, given breztri 2 weeks samples with instructions,  promethazine DM Strict follow up and ER precautions if getting worse CMA will call to follow up on status on Monday     Continue diet and meds as discussed. Further disposition pending results of labs. Discussed med's effects and SE's.   Over 30 minutes of exam, counseling, chart review, and critical decision making was performed.   Future Appointments  Date Time Provider [place] 02/26/2020 10:40 AM [facility] 1 [facility]  05/25/2020  9:30 AM MUnk Pinto MD [facility] None  11/10/2020  3:00 PM MUnk Pinto MD [facility] None    ----------------------------------------------------------------------------------------------------------------------  HPI 74y.o. male  presents for 3 month follow up on hypertension, cholesterol, prediabetes, weight and vitamin D deficiency.   He reports yesterday woke up with head congestion, started coughing up thick yellow secretions from his chest, denies dyspnea other than mildly last night, improved with albuterol and slept well all night. Denies CP, does endorse some body aches and fatigue, denies rash, fever/chills, sore throat, GI sx. Denies PND, edema, no hx of CHF. He reports nose has been alternating between running and stopped up. Denies hx of allergies, watery/itchy eyes or sneezing. Took "sinus pill" yesterday that seems to have improved congestion some. Had negative rapid covid 19 screen yesterday. He is vaccinated for covid 19 and influenza.   He has COPD per imaging, minimal sx and not on daily inhaler, uses PRN albuterol occasionally. He is not longer smoking after successful cessation with Chantix, 35 pack year. He had CT screening 02/03/2019 that showed L lobe nodule, now following closely with [facility] pulm due to high risk. Had follow up CT 08/12/2019 that showed some resolution of previous but with new areas of concern, planning for follow up  CT 02/26/2020.   he has a diagnosis of GERD which is currently managed by  lifestyle modification and protonix 40 mg, uses PRN, recently rare.    BMI is Body mass index is 22.53 kg/m., he has been working on diet and exercise. He admits he is not eating as much due to not being able to go to restaurants due to covid 19.  Wt Readings from Last 3 Encounters:  02/12/20 157 lb (71.2 kg)  01/27/20 156 lb 9.6 oz (71 kg)  11/09/19 157 lb (71.2 kg)   He has aortic atherosclerosis by imaging, CT 01/2019.  His blood pressure has been controlled at home, today their BP is BP: 106/62  He does workout. He denies chest pain, shortness of breath, dizziness.   He is on cholesterol medication (atorvastatin 40 mg daily) and denies myalgias. His cholesterol is at goal. The cholesterol last visit was:   Lab Results  Component Value Date   CHOL 167 11/09/2019   HDL 33 (L) 11/09/2019   LDLCALC 92 11/09/2019   TRIG 291 (H) 11/09/2019   CHOLHDL 5.1 (H) 11/09/2019    He has been working on diet and exercise for prediabetes, and denies foot ulcerations, hyperglycemia, hypoglycemia , increased appetite, nausea, paresthesia of the feet, polydipsia, polyuria, visual disturbances, vomiting and weight loss. He checks a fasting sugar occasionally but not recently. Last A1C in the office was:  Lab Results  Component Value Date   HGBA1C 5.9 (H) 11/09/2019   Typically at CKD II range, was down last check:  Denies NSAID use, has been pushing water intake, up to 2 bottles in addition to his typical pepsi, has reduced this from 3-4 a day to 1  Lab Results  Component Value Date   GFRNONAA 55 (L) 11/09/2019   GFRNONAA 77 07/16/2019   GFRNONAA 69 04/17/2019   Patient is on Vitamin D supplement and near goal at last check;    Lab Results  Component Value Date   VD25OH 48 11/09/2019      Current Medications:  Current Outpatient Medications on File Prior to Visit  Medication Sig   ACCU-CHEK SOFTCLIX LANCETS lancets Check blood sugar 1 time daily-DX-R73.09   albuterol (VENTOLIN HFA)  108 (90 Base) MCG/ACT inhaler 2 inhalations 15 minutes apart every 4 hours to Rescue Asthma   Alcohol Swabs (B-D SINGLE USE SWABS REGULAR) PADS Check blood sugar 1 time daily-DX-R73.09.   aspirin 81 MG tablet Take 81 mg by mouth daily.   Blood Glucose Calibration (ACCU-CHEK AVIVA) SOLN Use per box instruction.   Blood Glucose Monitoring Suppl (ACCU-CHEK AVIVA PLUS) w/Device KIT Check blood sugar 1 time daily-DX-R73.09   cetirizine (ZYRTEC ALLERGY) 10 MG tablet Take one tablet nightly for allergies.   Cholecalciferol (VITAMIN D) 2000 UNITS CAPS Take 2 capsules by mouth daily.    DILT-XR 240 MG 24 hr capsule TAKE 1 CAPSULE DAILY FOR BLOOD PRESSURE AND HEART BEAT   famotidine (PEPCID) 40 MG tablet Take       1 tablet        Daily         for Heart burn & Acid Indigestion   glucose blood (ACCU-CHEK AVIVA PLUS) test strip Check blood sugar 1 time daily-R73.09.   losartan (COZAAR) 100 MG tablet Take 1 tablet Daily for BP   magnesium gluconate (MAGONATE) 500 MG tablet Take 500 mg by mouth daily.    rosuvastatin (CRESTOR) 40 MG tablet Take 1 tablet Daily for Cholesterol   neomycin-polymyxin-hydrocortisone (CORTISPORIN) 3.5-10000-1 OTIC  suspension Instill      6-8 drops      to Left ear   3 to 4 x /day     & occlude with 1/2 cotton ball (Patient not taking: Reported on 02/12/2020)   No current facility-administered medications on file prior to visit.     Allergies:  Allergies  Allergen Reactions   Ppd [Tuberculin Purified Protein Derivative]     Positive PPD.     Medical History:  Past Medical History:  Diagnosis Date   Diabetes mellitus without complication ([place])    Hyperlipidemia    Hypertension    Vitamin D deficiency    Family history- Reviewed and unchanged Social history- Reviewed and unchanged   Review of Systems:  Review of Systems  Constitutional: Negative for malaise/fatigue and weight loss.  HENT: Positive for congestion. Negative for hearing loss and  tinnitus.   Eyes: Negative for blurred vision and double vision.  Respiratory: Positive for cough, sputum production and shortness of breath. Negative for wheezing (very mild, last night).   Cardiovascular: Negative for chest pain, palpitations, orthopnea, claudication and leg swelling.  Gastrointestinal: Negative for abdominal pain, blood in stool, constipation, diarrhea, heartburn, melena, nausea and vomiting.  Genitourinary: Negative.   Musculoskeletal: Negative for joint pain and myalgias.  Skin: Negative for rash.  Neurological: Negative for dizziness, tingling, sensory change, weakness and headaches.  Endo/Heme/Allergies: Negative for environmental allergies and polydipsia.  Psychiatric/Behavioral: Negative.   All other systems reviewed and are negative.   Physical Exam: BP 106/62    Pulse 80    Temp (!) 97 F (36.1 C)    Wt 157 lb (71.2 kg)    SpO2 95%    BMI 22.53 kg/m  Wt Readings from Last 3 Encounters:  02/12/20 157 lb (71.2 kg)  01/27/20 156 lb 9.6 oz (71 kg)  11/09/19 157 lb (71.2 kg)   General Appearance: Well nourished, in no apparent distress. Eyes: PERRLA, EOMs, conjunctiva no swelling or erythema Sinuses: No Frontal/maxillary tenderness ENT/Mouth: Ext aud canals clear, TMs without erythema, bulging. No erythema, swelling, or exudate on post pharynx.  Tonsils not swollen or erythematous. Hearing normal.  Neck: Supple, thyroid normal.  Respiratory: Respiratory effort normal, BS diminished throughout, limited expansion without rales, he does have scattered rhonchi, without wheezing or stridor.  Cardio: RRR with no MRGs. Brisk peripheral pulses without edema.  Abdomen: Soft, + BS.  Non tender, no guarding, rebound, hernias, masses. Lymphatics: Non tender without lymphadenopathy.  Musculoskeletal: Full ROM, 5/5 strength, Normal gait Skin: Warm, dry without rashes, lesions, ecchymosis.  Neuro: Cranial nerves intact. No cerebellar symptoms.  Psych: Awake and oriented X  3, normal affect, Insight and Judgment appropriate.    Izora Ribas, NP 9:06 AM [facility]

## 2020-02-12 ENCOUNTER — Ambulatory Visit
Admission: RE | Admit: 2020-02-12 | Discharge: 2020-02-12 | Disposition: A | Discharge status: Home / Self Care | Payer: Medicare HMO | Source: Ambulatory Visit | Attending: Adult Health | Admitting: Adult Health

## 2020-02-12 ENCOUNTER — Ambulatory Visit (INDEPENDENT_AMBULATORY_CARE_PROVIDER_SITE_OTHER): Payer: Medicare HMO | Admitting: Adult Health

## 2020-02-12 ENCOUNTER — Other Ambulatory Visit: Payer: Self-pay

## 2020-02-12 ENCOUNTER — Encounter: Payer: Self-pay | Admitting: Adult Health

## 2020-02-12 VITALS — BP 106/62 | HR 80 | Temp 97.0°F | Wt 157.0 lb

## 2020-02-12 DIAGNOSIS — E782 Mixed hyperlipidemia: Secondary | ICD-10-CM

## 2020-02-12 DIAGNOSIS — R911 Solitary pulmonary nodule: Secondary | ICD-10-CM | POA: Diagnosis not present

## 2020-02-12 DIAGNOSIS — E559 Vitamin D deficiency, unspecified: Secondary | ICD-10-CM

## 2020-02-12 DIAGNOSIS — F17200 Nicotine dependence, unspecified, uncomplicated: Secondary | ICD-10-CM

## 2020-02-12 DIAGNOSIS — R0989 Other specified symptoms and signs involving the circulatory and respiratory systems: Secondary | ICD-10-CM

## 2020-02-12 DIAGNOSIS — R7309 Other abnormal glucose: Secondary | ICD-10-CM | POA: Diagnosis not present

## 2020-02-12 DIAGNOSIS — I700 Atherosclerosis of aorta: Secondary | ICD-10-CM | POA: Diagnosis not present

## 2020-02-12 DIAGNOSIS — R059 Cough, unspecified: Secondary | ICD-10-CM

## 2020-02-12 DIAGNOSIS — Z79899 Other long term (current) drug therapy: Secondary | ICD-10-CM | POA: Diagnosis not present

## 2020-02-12 DIAGNOSIS — J441 Chronic obstructive pulmonary disease with (acute) exacerbation: Secondary | ICD-10-CM

## 2020-02-12 DIAGNOSIS — E0590 Thyrotoxicosis, unspecified without thyrotoxic crisis or storm: Secondary | ICD-10-CM

## 2020-02-12 DIAGNOSIS — J449 Chronic obstructive pulmonary disease, unspecified: Secondary | ICD-10-CM

## 2020-02-12 DIAGNOSIS — I10 Essential (primary) hypertension: Secondary | ICD-10-CM

## 2020-02-12 MED ORDER — BREZTRI AEROSPHERE 160-9-4.8 MCG/ACT IN AERO: 1.0000 | INHALATION_SPRAY | Freq: Two times a day (BID) | RESPIRATORY_TRACT | 0 refills | Status: DC

## 2020-02-12 MED ORDER — AZITHROMYCIN 250 MG PO TABS: ORAL_TABLET | ORAL | 1 refills | Status: AC

## 2020-02-12 MED ORDER — PROMETHAZINE-DM 6.25-15 MG/5ML PO SYRP: 5.0000 mL | ORAL_SOLUTION | Freq: Four times a day (QID) | ORAL | 1 refills | Status: DC | PRN

## 2020-02-12 NOTE — Patient Instructions (Signed)
Goals     HEMOGLOBIN A1C < 5.7     LDL CALC < 100     Quit Smoking          Chronic Obstructive Pulmonary Disease Exacerbation Chronic obstructive pulmonary disease (COPD) is a long-term (chronic) lung problem. In COPD, the flow of air from the lungs is limited. COPD exacerbations are times that breathing gets worse and you need more than your normal treatment. Without treatment, they can be life threatening. If they happen often, your lungs can become more damaged. If your COPD gets worse, your doctor may treat you with:  Medicines.  Oxygen.  Different ways to clear your airway, such as using a mask. Follow these instructions at home: Medicines  Take over-the-counter and prescription medicines only as told by your doctor.  If you take an antibiotic or steroid medicine, do not stop taking the medicine even if you start to feel better.  Keep up with shots (vaccinations) as told by your doctor. Be sure to get a yearly (annual) flu shot. Lifestyle  Do not smoke. If you need help quitting, ask your doctor.  Eat healthy foods.  Exercise regularly.  Get plenty of sleep.  Avoid tobacco smoke and other things that can bother your lungs.  Wash your hands often with soap and water. This will help keep you from getting an infection. If you cannot use soap and water, use hand sanitizer.  During flu season, avoid areas that are crowded with people. General instructions  Drink enough fluid to keep your pee (urine) clear or pale yellow. Do not do this if your doctor has told you not to.  Use a cool mist machine (vaporizer).  If you use oxygen or a machine that turns medicine into a mist (nebulizer), continue to use it as told.  Follow all instructions for rehabilitation. These are steps you can take to make your body work better.  Keep all follow-up visits as told by your doctor. This is important. Contact a doctor if:  Your COPD symptoms get worse than normal. Get help  right away if:  You are short of breath and it gets worse.  You have trouble talking.  You have chest pain.  You cough up blood.  You have a fever.  You keep throwing up (vomiting).  You feel weak or you pass out (faint).  You feel confused.  You are not able to sleep because of your symptoms.  You are not able to do daily activities. Summary  COPD exacerbations are times that breathing gets worse and you need more treatment than normal.  COPD exacerbations can be very serious and may cause your lungs to become more damaged.  Do not smoke. If you need help quitting, ask your doctor.  Stay up-to-date on your shots. Get a flu shot every year. This information is not intended to replace advice given to you by your health care provider. Make sure you discuss any questions you have with your health care provider. Document Revised: 02/22/2017 Document Reviewed: 04/16/2016 Elsevier Patient Education  2020 [employer].

## 2020-02-13 LAB — COMPLETE METABOLIC PANEL WITH GFR
AG Ratio: 1.6 (calc) (ref 1.0–2.5)
ALT: 15 U/L (ref 9–46)
AST: 16 U/L (ref 10–35)
Albumin: 4.1 g/dL (ref 3.6–5.1)
Alkaline phosphatase (APISO): 87 U/L (ref 35–144)
BUN: 12 mg/dL (ref 7–25)
CO2: 29 mmol/L (ref 20–32)
Calcium: 9.7 mg/dL (ref 8.6–10.3)
Chloride: 106 mmol/L (ref 98–110)
Creat: 1.14 mg/dL (ref 0.70–1.18)
GFR, Est African American: 73 mL/min/{1.73_m2} (ref >=60)
GFR, Est Non African American: 63 mL/min/{1.73_m2} (ref >=60)
Globulin: 2.5 g/dL (calc) (ref 1.9–3.7)
Glucose, Bld: 98 mg/dL (ref 65–99)
Potassium: 4.3 mmol/L (ref 3.5–5.3)
Sodium: 141 mmol/L (ref 135–146)
Total Bilirubin: 0.4 mg/dL (ref 0.2–1.2)
Total Protein: 6.6 g/dL (ref 6.1–8.1)

## 2020-02-13 LAB — LIPID PANEL
Cholesterol: 128 mg/dL (ref <200)
HDL: 34 mg/dL — ABNORMAL LOW (ref >=40)
LDL Cholesterol (Calc): 72 mg/dL (calc)
Non-HDL Cholesterol (Calc): 94 mg/dL (calc) (ref <130)
Total CHOL/HDL Ratio: 3.8 (calc) (ref <5.0)
Triglycerides: 135 mg/dL (ref <150)

## 2020-02-13 LAB — CBC WITH DIFFERENTIAL/PLATELET
Absolute Monocytes: 719 cells/uL (ref 200–950)
Basophils Absolute: 74 cells/uL (ref 0–200)
Basophils Relative: 0.6 %
Eosinophils Absolute: 124 cells/uL (ref 15–500)
Eosinophils Relative: 1 %
HCT: 43.5 % (ref 38.5–50.0)
Hemoglobin: 14.8 g/dL (ref 13.2–17.1)
Lymphs Abs: 1438 cells/uL (ref 850–3900)
MCH: 32.9 pg (ref 27.0–33.0)
MCHC: 34 g/dL (ref 32.0–36.0)
MCV: 96.7 fL (ref 80.0–100.0)
MPV: 11.1 fL (ref 7.5–12.5)
Monocytes Relative: 5.8 %
Neutro Abs: 10044 cells/uL — ABNORMAL HIGH (ref 1500–7800)
Neutrophils Relative %: 81 %
Platelets: 257 10*3/uL (ref 140–400)
RBC: 4.5 10*6/uL (ref 4.20–5.80)
RDW: 12.5 % (ref 11.0–15.0)
Total Lymphocyte: 11.6 %
WBC: 12.4 10*3/uL — ABNORMAL HIGH (ref 3.8–10.8)

## 2020-02-13 LAB — TSH: TSH: 0.14 mIU/L — ABNORMAL LOW (ref 0.40–4.50)

## 2020-02-13 LAB — MAGNESIUM: Magnesium: 1.7 mg/dL (ref 1.5–2.5)

## 2020-02-25 ENCOUNTER — Other Ambulatory Visit: Payer: Self-pay

## 2020-02-25 MED ORDER — ROSUVASTATIN CALCIUM 40 MG PO TABS
ORAL_TABLET | ORAL | 3 refills | Status: DC
Start: 2020-02-25 — End: 2021-01-26

## 2020-02-26 ENCOUNTER — Ambulatory Visit
Admission: RE | Admit: 2020-02-26 | Discharge: 2020-02-26 | Disposition: A | Discharge status: Home / Self Care | Payer: Medicare HMO | Source: Ambulatory Visit | Attending: Adult Health | Admitting: Adult Health

## 2020-02-26 DIAGNOSIS — I2510 Atherosclerotic heart disease of native coronary artery without angina pectoris: Secondary | ICD-10-CM | POA: Diagnosis not present

## 2020-02-26 DIAGNOSIS — K449 Diaphragmatic hernia without obstruction or gangrene: Secondary | ICD-10-CM | POA: Diagnosis not present

## 2020-02-26 DIAGNOSIS — Z87891 Personal history of nicotine dependence: Secondary | ICD-10-CM

## 2020-02-26 DIAGNOSIS — J984 Other disorders of lung: Secondary | ICD-10-CM | POA: Diagnosis not present

## 2020-02-26 DIAGNOSIS — Z122 Encounter for screening for malignant neoplasm of respiratory organs: Secondary | ICD-10-CM

## 2020-02-26 DIAGNOSIS — J439 Emphysema, unspecified: Secondary | ICD-10-CM | POA: Diagnosis not present

## 2020-03-04 NOTE — Progress Notes (Signed)
Called and spoke with patient, provided results/recommendations per Tammy Parrett NP.  Patient verbalized understanding.  Nothing further needed.

## 2020-03-11 ENCOUNTER — Ambulatory Visit
Admission: RE | Admit: 2020-03-11 | Discharge: 2020-03-11 | Disposition: A | Discharge status: Home / Self Care | Payer: Medicare HMO | Source: Ambulatory Visit | Attending: Adult Health | Admitting: Adult Health

## 2020-03-11 ENCOUNTER — Other Ambulatory Visit: Payer: Self-pay

## 2020-03-11 ENCOUNTER — Encounter: Payer: Self-pay | Admitting: Adult Health

## 2020-03-11 ENCOUNTER — Ambulatory Visit (INDEPENDENT_AMBULATORY_CARE_PROVIDER_SITE_OTHER): Payer: Medicare HMO | Admitting: Adult Health

## 2020-03-11 VITALS — BP 132/78 | HR 80 | Temp 97.9°F | Wt 156.0 lb

## 2020-03-11 DIAGNOSIS — Z1152 Encounter for screening for COVID-19: Secondary | ICD-10-CM | POA: Diagnosis not present

## 2020-03-11 DIAGNOSIS — R059 Cough, unspecified: Secondary | ICD-10-CM | POA: Diagnosis not present

## 2020-03-11 DIAGNOSIS — R0989 Other specified symptoms and signs involving the circulatory and respiratory systems: Secondary | ICD-10-CM | POA: Diagnosis not present

## 2020-03-11 DIAGNOSIS — J441 Chronic obstructive pulmonary disease with (acute) exacerbation: Secondary | ICD-10-CM | POA: Diagnosis not present

## 2020-03-11 DIAGNOSIS — R058 Other specified cough: Secondary | ICD-10-CM

## 2020-03-11 LAB — POC COVID19 BINAXNOW: SARS Coronavirus 2 Ag: NEGATIVE

## 2020-03-11 MED ORDER — AZITHROMYCIN 250 MG PO TABS: ORAL_TABLET | ORAL | 1 refills | Status: AC

## 2020-03-11 MED ORDER — PROMETHAZINE-DM 6.25-15 MG/5ML PO SYRP: 5.0000 mL | ORAL_SOLUTION | Freq: Four times a day (QID) | ORAL | 1 refills | Status: DC | PRN

## 2020-03-11 MED ORDER — BREZTRI AEROSPHERE 160-9-4.8 MCG/ACT IN AERO: 1.0000 | INHALATION_SPRAY | Freq: Two times a day (BID) | RESPIRATORY_TRACT | 0 refills | Status: DC

## 2020-03-11 MED ORDER — IPRATROPIUM-ALBUTEROL 0.5-2.5 (3) MG/3ML IN SOLN
3.0000 mL | Freq: Once | RESPIRATORY_TRACT | Status: AC
  Administered 2020-03-11: 3 mL via RESPIRATORY_TRACT

## 2020-03-11 MED ORDER — PREDNISONE 20 MG PO TABS: ORAL_TABLET | ORAL | 0 refills | Status: DC

## 2020-03-11 NOTE — Progress Notes (Signed)
Assessment and Plan:  Jesus Bush was seen today for acute visit.  Diagnoses and all orders for this visit:  Chronic obstructive pulmonary disease with acute exacerbation (HCC) COPD flare vs r/o CAP Had recent CT follow up on pulm nodule that was reassuring Get CXR Stable in office, appropriate for outpatient treatment Mild improvement with neb in office Appears stable; given breztri to start with instructions Add guaifenacin Continue to aovid smoking and irritants Follow up in office or go to ED if getting worse -     ipratropium-albuterol (DUONEB) 0.5-2.5 (3) MG/3ML nebulizer solution 3 mL -     promethazine-dextromethorphan (PROMETHAZINE-DM) 6.25-15 MG/5ML syrup; Take 5 mLs by mouth 4 (four) times daily as needed for cough. -     DG Chest 2 View; Future -     azithromycin (ZITHROMAX) 250 MG tablet; Take 2 tablets (500 mg) on  Day 1,  followed by 1 tablet (250 mg) once daily on Days 2 through 5. -     Budeson-Glycopyrrol-Formoterol (BREZTRI AEROSPHERE) 160-9-4.8 MCG/ACT AERO; Inhale 1 puff into the lungs 2 (two) times daily. Rinse mouth out and gargle after each use to avoid thrush. -     predniSONE (DELTASONE) 20 MG tablet; 2 tablets daily for 3 days, 1 tablet daily for 4 days.  Abnormal lung sounds/productive cough R/o pneumonia  -     DG Chest 2 View; Future  Further disposition pending results of labs. Discussed med's effects and SE's.   Over 30 minutes of exam, counseling, chart review, and critical decision making was performed.   Future Appointments  Date Time Provider [place]  05/25/2020  9:30 AM Unk Pinto, MD [facility] None  11/10/2020  3:00 PM Unk Pinto, MD [facility] None    ------------------------------------------------------------------------------------------------------------------   HPI BP 132/78   Pulse 80   Temp 97.9 F (36.6 C)   Wt 156 lb (70.8 kg)   SpO2 95%   BMI 22.38 kg/m   74 y.o.male former smoker, pulm nodule of ,1 COPD  primarily by imaging presents for evaluation of persistent progressive cough. He had negative covid 19  test 03/05/2020 after onset of sx and again today. Has had 2/2 pfizer.   He reports 8 days ago started feeling more fatigued and no energy, then started coughing, sense of chest congestion, was improving with OTC cough medication but worse in the last 3 days, coughing large quantities of thick yellow mucus, also with nasal congestion, tender sinuses, dull headache. Denies fever/chills, dyspnea, wheezing, fever/chills. Does endorse general body aches.   He did take tylenol for aches, taking 2 tabs (unsure of dose) QID which is helping  Hx of left lobe nodule on 02/03/2019, 6.5 mm, follow up CT 02/26/2020 Showed  Lungs/Pleura: 9.2 mm irregular nodule in the superior segment right lower lobe (image 98), unchanged from priors, although not previously measured on DynaCAD.  Additional small bilateral pulmonary nodules measuring up to 6.6 mm in the posterior left upper lobe, unchanged.  Biapical pleural-parenchymal scarring. Paraseptal emphysematous changes, upper lobe predominant. No focal consolidation.  Past Medical History:  Diagnosis Date  . Diabetes mellitus without complication ([place])   . Hyperlipidemia   . Hypertension   . Vitamin D deficiency      Allergies  Allergen Reactions  . Ppd [Tuberculin Purified Protein Derivative]     Positive PPD.    Allergies:  Allergies  Allergen Reactions  . Ppd [Tuberculin Purified Protein Derivative]     Positive PPD.   Social History:   reports that  he quit smoking about 8 months ago. His smoking use included cigarettes. He started smoking about 34 years ago. He has a 66.00 pack-year smoking history. He has never used smokeless tobacco. He reports that he does not drink alcohol and does not use drugs.   Current Outpatient Medications on File Prior to Visit  Medication Sig  . ACCU-CHEK SOFTCLIX LANCETS lancets Check blood sugar 1 time  daily-DX-R73.09  . albuterol (VENTOLIN HFA) 108 (90 Base) MCG/ACT inhaler 2 inhalations 15 minutes apart every 4 hours to Rescue Asthma  . Alcohol Swabs (B-D SINGLE USE SWABS REGULAR) PADS Check blood sugar 1 time daily-DX-R73.09.  Marland Kitchen aspirin 81 MG tablet Take 81 mg by mouth daily.  . Blood Glucose Calibration (ACCU-CHEK AVIVA) SOLN Use per box instruction.  . Blood Glucose Monitoring Suppl (ACCU-CHEK AVIVA PLUS) w/Device KIT Check blood sugar 1 time daily-DX-R73.09  . cetirizine (ZYRTEC ALLERGY) 10 MG tablet Take one tablet nightly for allergies.  . Cholecalciferol (VITAMIN D) 2000 UNITS CAPS Take 2 capsules by mouth daily.   Marland Kitchen DILT-XR 240 MG 24 hr capsule TAKE 1 CAPSULE DAILY FOR BLOOD PRESSURE AND HEART BEAT  . famotidine (PEPCID) 40 MG tablet Take       1 tablet        Daily         for Heart burn & Acid Indigestion  . glucose blood (ACCU-CHEK AVIVA PLUS) test strip Check blood sugar 1 time daily-R73.09.  . losartan (COZAAR) 100 MG tablet Take 1 tablet Daily for BP  . magnesium gluconate (MAGONATE) 500 MG tablet Take 500 mg by mouth daily.   . rosuvastatin (CRESTOR) 40 MG tablet Take 1 tablet Daily for Cholesterol (Patient not taking: Reported on 03/11/2020)   No current facility-administered medications on file prior to visit.    ROS: all negative except above.   Physical Exam:  BP 132/78   Pulse 80   Temp 97.9 F (36.6 C)   Wt 156 lb (70.8 kg)   SpO2 95%   BMI 22.38 kg/m   General Appearance: Well nourished, in no apparent distress. Eyes: PERRLA, conjunctiva no swelling or erythema Sinuses: No Frontal/maxillary tenderness ENT/Mouth: Ext aud canals clear, TMs without erythema, bulging. No erythema, swelling, or exudate on post pharynx.  Tonsils not swollen or erythematous. Hearing normal.  Neck: Supple, thyroid normal.  Respiratory: Respiratory effort normal, Limited expansioin, BS diminished throughout, rhonchi to LLL; improved airflow after neb, LUL remains fairly diminished  and persistent LLL rhonchi. No wheezing or stridor.  Cardio: RRR with no MRGs. Brisk peripheral pulses without edema.  Abdomen: Soft, + BS.  Non tender. Lymphatics: Non tender without lymphadenopathy.  Musculoskeletal:  normal gait.  Skin: Warm, dry without rashes, lesions, ecchymosis.  Neuro: Normal muscle tone  Psych: Awake and oriented X 3, normal affect, Insight and Judgment appropriate.     Izora Ribas, NP 12:13 PM [facility]

## 2020-03-11 NOTE — Patient Instructions (Addendum)
Breztri - 1 puff twice daily, brush teeth and gargle afterwards  Add Mucinex (guaifenacin) - drink lots of water with this  If getting worse call or go to the ER    Chronic Obstructive Pulmonary Disease Exacerbation Chronic obstructive pulmonary disease (COPD) is a long-term (chronic) lung problem. In COPD, the flow of air from the lungs is limited. COPD exacerbations are times that breathing gets worse and you need more than your normal treatment. Without treatment, they can be life threatening. If they happen often, your lungs can become more damaged. If your COPD gets worse, your doctor may treat you with:  Medicines.  Oxygen.  Different ways to clear your airway, such as using a mask. Follow these instructions at home: Medicines  Take over-the-counter and prescription medicines only as told by your doctor.  If you take an antibiotic or steroid medicine, do not stop taking the medicine even if you start to feel better.  Keep up with shots (vaccinations) as told by your doctor. Be sure to get a yearly (annual) flu shot. Lifestyle  Do not smoke. If you need help quitting, ask your doctor.  Eat healthy foods.  Exercise regularly.  Get plenty of sleep.  Avoid tobacco smoke and other things that can bother your lungs.  Wash your hands often with soap and water. This will help keep you from getting an infection. If you cannot use soap and water, use hand sanitizer.  During flu season, avoid areas that are crowded with people. General instructions  Drink enough fluid to keep your pee (urine) clear or pale yellow. Do not do this if your doctor has told you not to.  Use a cool mist machine (vaporizer).  If you use oxygen or a machine that turns medicine into a mist (nebulizer), continue to use it as told.  Follow all instructions for rehabilitation. These are steps you can take to make your body work better.  Keep all follow-up visits as told by your doctor. This is  important. Contact a doctor if:  Your COPD symptoms get worse than normal. Get help right away if:  You are short of breath and it gets worse.  You have trouble talking.  You have chest pain.  You cough up blood.  You have a fever.  You keep throwing up (vomiting).  You feel weak or you pass out (faint).  You feel confused.  You are not able to sleep because of your symptoms.  You are not able to do daily activities. Summary  COPD exacerbations are times that breathing gets worse and you need more treatment than normal.  COPD exacerbations can be very serious and may cause your lungs to become more damaged.  Do not smoke. If you need help quitting, ask your doctor.  Stay up-to-date on your shots. Get a flu shot every year. This information is not intended to replace advice given to you by your health care provider. Make sure you discuss any questions you have with your health care provider. Document Revised: 02/22/2017 Document Reviewed: 04/16/2016 Elsevier Patient Education  2020 [employer].

## 2020-03-12 ENCOUNTER — Other Ambulatory Visit: Payer: Self-pay | Admitting: Adult Health

## 2020-03-12 ENCOUNTER — Other Ambulatory Visit: Payer: Self-pay | Admitting: Internal Medicine

## 2020-03-12 ENCOUNTER — Encounter: Payer: Self-pay | Admitting: Adult Health

## 2020-03-12 DIAGNOSIS — J189 Pneumonia, unspecified organism: Secondary | ICD-10-CM | POA: Insufficient documentation

## 2020-03-12 DIAGNOSIS — J449 Chronic obstructive pulmonary disease, unspecified: Secondary | ICD-10-CM

## 2020-03-21 ENCOUNTER — Other Ambulatory Visit: Payer: Self-pay | Admitting: Adult Health

## 2020-03-21 ENCOUNTER — Other Ambulatory Visit: Payer: Self-pay | Admitting: Internal Medicine

## 2020-03-21 DIAGNOSIS — J449 Chronic obstructive pulmonary disease, unspecified: Secondary | ICD-10-CM

## 2020-04-03 DIAGNOSIS — U071 COVID-19: Secondary | ICD-10-CM | POA: Insufficient documentation

## 2020-04-04 ENCOUNTER — Encounter: Payer: Self-pay | Admitting: Internal Medicine

## 2020-04-04 ENCOUNTER — Ambulatory Visit: Payer: Medicare HMO | Admitting: Internal Medicine

## 2020-04-04 ENCOUNTER — Other Ambulatory Visit: Payer: Self-pay

## 2020-04-04 VITALS — Temp 98.0°F

## 2020-04-04 DIAGNOSIS — U071 COVID-19: Secondary | ICD-10-CM | POA: Diagnosis not present

## 2020-04-04 DIAGNOSIS — J0140 Acute pansinusitis, unspecified: Secondary | ICD-10-CM

## 2020-04-04 DIAGNOSIS — J40 Bronchitis, not specified as acute or chronic: Secondary | ICD-10-CM | POA: Diagnosis not present

## 2020-04-04 MED ORDER — PROMETHAZINE-DM 6.25-15 MG/5ML PO SYRP: ORAL_SOLUTION | ORAL | 1 refills | Status: DC

## 2020-04-04 MED ORDER — DEXAMETHASONE 4 MG PO TABS: ORAL_TABLET | ORAL | 0 refills | Status: DC

## 2020-04-04 MED ORDER — AZITHROMYCIN 250 MG PO TABS: ORAL_TABLET | ORAL | 1 refills | Status: DC

## 2020-04-04 NOTE — Progress Notes (Signed)
History of Present Illness:      This very nice 75 yo MWM  With HTN & COPD  presents for televisit with sx's of S/T, cough, PND for about a week and his sinus drainage and sputum have become thicker & more purulent. He is on Albuterol & Breztri inhalers and denies any dyspnea. He denies fevers, chills, sweats or rashes.    Medications  .  DILT-XR 240 MG 24 hr capsule, TAKE 1 CAPSULE DAILY FOR BLOOD PRESSURE AND HEART BEAT .  losartan (COZAAR) 100 MG tablet, Take 1 tablet Daily for BP .  rosuvastatin (CRESTOR) 40 MG tablet, Take 1 tablet Daily for Cholesterol  .  albuterol (VENTOLIN HFA) 108 (90 Base) MCG/ACT inhaler, 2 inhalations 15 minutes apart every 4 hours to Rescue Asthma  .  Budeson-Glycopyrrol-Formoterol (BREZTRI AEROSPHERE) 160-9-4.8 MCG/ACT AERO, Inhale 1 puff into the lungs 2 (two) times daily. Rinse mouth out and gargle after each use to avoid thrush.  .  cetirizine (ZYRTEC ALLERGY) 10 MG tablet, Take one tablet nightly for allergies.  Marland Kitchen  aspirin 81 MG tablet, Take 81 mg by mouth daily.  .  Cholecalciferol (VITAMIN D) 2000 UNITS CAPS, Take 2 capsules by mouth daily.   .  famotidine (PEPCID) 40 MG tablet, Take       1 tablet        Daily         for Heart burn & Acid Indigestion  .  magnesium gluconate (MAGONATE) 500 MG tablet, Take 500 mg by mouth daily.   Problem list He has Abnormal glucose (prediabetes); Essential hypertension; Mixed hyperlipidemia; Vitamin D deficiency; Medication management; Former smoker; Esophageal reflux; COPD (chronic obstructive pulmonary disease) ([place]); Aortic atherosclerosis ([place]); Hyperthyroidism; Nodule of left lung; LLL pneumonia; and COVID on their problem list.   Observations/Objective:  Temp 98 F (36.7 C)    General : Well sounding patient in no apparent distress HEENT: no hoarseness, no cough for duration of visit Lungs: speaks in complete sentences, no audible wheezing, no apparent distress Neurological: alert, oriented x  3 Psychiatric: pleasant, judgement appropriate   Assessment and Plan:   1. Acute non-recurrent pansinusitis  - dexamethasone (DECADRON) 4 MG tablet; Take 1 tab 3 x day - 3 days, then 2 x day - 3 days, then 1 tab daily  Dispense: 20 tablet  - azithromycin\250 MG tablet; Take 2 tablets with Food on  Day 1, then 1 tablet Daily with Food for Sinusitis / Bronchitis  Dispense: 6 each; Refill: 1  2. Bronchitis  - dexamethasone \ 4 MG tablet; Take 1 tab 3 x day - 3 days, then 2 x day - 3 days, then 1 tab daily  Dispense: 20 tablet;   - promethazine-dextromethorphan \ 6.25-15 MG/5ML syrup; Take 1 to 2 tsp enery 4 hours if needed for cough  Dispense: 360 mL; Refill: 1  - azithromycin \\250  MG tablet; Take 2 tablets with Food on  Day 1, then 1 tablet Daily with Food for Sinusitis / Bronchitis  Dispense: 6 each; Refill: 1  3. COVID-19 virus infection  - dexamethasone \\4  MG tablet; Take 1 tab 3 x day - 3 days, then 2 x day - 3 days, then 1 tab daily  Dispense: 20 tablet; Refill: 0   Follow Up Instructions:      I discussed the assessment and treatment plan with the patient. The patient was provided an opportunity to ask questions and all were answered. The patient agreed with the plan and  demonstrated an understanding of the instructions.       The patient was advised to call back or seek an in-person evaluation if the symptoms worsen or if the condition fails to improve as anticipated.   Kirtland Bouchard, MD

## 2020-05-25 ENCOUNTER — Ambulatory Visit: Payer: Medicare HMO | Admitting: Internal Medicine

## 2020-06-05 ENCOUNTER — Other Ambulatory Visit: Payer: Self-pay | Admitting: Internal Medicine

## 2020-06-23 ENCOUNTER — Encounter: Payer: Self-pay | Admitting: Internal Medicine

## 2020-06-23 NOTE — Patient Instructions (Signed)

## 2020-06-23 NOTE — Progress Notes (Signed)
Future Appointments  Date Time Provider [place]  06/24/2020 11:00 AM Unk Pinto, MD [facility] None  09/05/2020 10:30 AM Garnet Sierras, NP [facility] None  12/06/2020 11:00 AM Unk Pinto, MD [facility] None    History of Present Illness:       This very nice 75 y.o.  MWM with  HTN, HLD, T2_NIDDM, Occult Hyperthyroidism, COPD and Vitamin D Deficiency presents with c/o Cough & Congestion. In Jan, patient was last treated with a Zpak & Decadron taper. Now , he presents with 2 week prodrome of increasing  chest congestion. Sputum is green. Denies dyspnea or rash.     Medications  .  DILT-XR 240 MG , TAKE 1 CAPSULE DAILY  .  losartan  100 MG tablet, TAKE 1 TABLET EVERY DAY  .  rosuvastatin 40 MG tablet, Take 1 tablet Daily for Cholesterol  .  albuterol HFA inhaler, 2 inhalations 15 minutes apart every 4 hours to Rescue Asthma  .  BREZTRI AEROSPHERE 160-9-4.8 MCG/ACT AERO, Inhale 1 puff  2  times daily.   .  cetirizine  10 MG tablet, Take one tablet nightly for allergies.  Marland Kitchen  aspirin 81 MG tablet, Take  daily. Marland Kitchen  VITAMIN D 2000 UNITS CAPS, Take 2 capsules  daily.  .  famotidine  40 MG tablet, Take  1 tablet Daily .  magnesium  500 MG tablet, Takedaily.   Problem list He has Abnormal glucose (prediabetes); Essential hypertension; Mixed hyperlipidemia; Vitamin D deficiency; Medication management; Former smoker; Esophageal reflux; COPD (chronic obstructive pulmonary disease) ([place]); Aortic atherosclerosis ([place]); Hyperthyroidism; Nodule of left lung; LLL pneumonia; and COVID on their problem list.   Observations/Objective:  BP 132/60   Pulse 90   Temp 97.9 F (36.6 C)   Resp 16   Ht 5\' 10"  (1.778 m)   Wt 155 lb 3.2 oz (70.4 kg)   SpO2 93%   BMI 22.27 kg/m   Appears in no Distress. No stridor.   HEENT - WNL. Neck - supple.  Car 2+ w/o Bruits or JVD. Chest -  BS equal, decreased with scattered coarse inspiratory  rales & expiratory rhonchi and  forced post tussive end expiratory wheezes.  Cor - Nl HS. RRR w/o sig MGR. PP 1(+). No edema. MS- FROM w/o deformities.  Gait Nl. Neuro -  Nl w/o focal abnormalities.  Assessment and Plan:  1. Moderate persistent asthmatic bronchitis with acute exacerbation  - dexamethasone 4 mg; Take 1 tab 3 x day - 3 days, then 2 x day - 3 days, then 1 tab daily  Disp: 20 tab - benzonatate  200 MG caps; Take 1 perle 3 x /day to prevent cough  Disp: 30 caps; Refill: 1 - doxycycline 100 MG cap; Take 1 capsule 2 x day with meals for Infection  Disp: 60 caps - TRELEGY ELLIPTA 200-62.5-25; Use  1 inhalation  Daily -  Disp: 60 each  Follow Up Instructions:       I discussed the assessment and treatment plan with the patient. The patient was provided an opportunity to ask questions and all were answered. The patient agreed with the plan and demonstrated an understanding of the instructions.       The patient was advised to call back or seek an in-person evaluation if the symptoms worsen or if the condition fails to improve as anticipated.   Kirtland Bouchard, MD

## 2020-06-24 ENCOUNTER — Other Ambulatory Visit: Payer: Self-pay

## 2020-06-24 ENCOUNTER — Ambulatory Visit (INDEPENDENT_AMBULATORY_CARE_PROVIDER_SITE_OTHER): Payer: Medicare HMO | Admitting: Internal Medicine

## 2020-06-24 VITALS — BP 132/60 | HR 90 | Temp 97.9°F | Resp 16 | Ht 70.0 in | Wt 155.2 lb

## 2020-06-24 DIAGNOSIS — J4541 Moderate persistent asthma with (acute) exacerbation: Secondary | ICD-10-CM | POA: Diagnosis not present

## 2020-06-24 MED ORDER — TRELEGY ELLIPTA 200-62.5-25 MCG/INH IN AEPB: INHALATION_SPRAY | RESPIRATORY_TRACT | 0 refills | Status: DC

## 2020-06-24 MED ORDER — DEXAMETHASONE 4 MG PO TABS: ORAL_TABLET | ORAL | 0 refills | Status: DC

## 2020-06-24 MED ORDER — BENZONATATE 200 MG PO CAPS: ORAL_CAPSULE | ORAL | 1 refills | Status: DC

## 2020-06-24 MED ORDER — DOXYCYCLINE HYCLATE 100 MG PO CAPS: ORAL_CAPSULE | ORAL | 0 refills | Status: DC

## 2020-08-14 ENCOUNTER — Other Ambulatory Visit: Payer: Self-pay | Admitting: Adult Health

## 2020-09-05 ENCOUNTER — Ambulatory Visit: Payer: Medicare HMO | Admitting: Adult Health Nurse Practitioner

## 2020-09-16 ENCOUNTER — Ambulatory Visit (INDEPENDENT_AMBULATORY_CARE_PROVIDER_SITE_OTHER): Payer: Medicare HMO | Admitting: Adult Health

## 2020-09-16 ENCOUNTER — Encounter: Payer: Self-pay | Admitting: Adult Health

## 2020-09-16 ENCOUNTER — Other Ambulatory Visit: Payer: Self-pay

## 2020-09-16 VITALS — BP 110/64 | HR 66 | Temp 97.5°F | Ht 70.0 in | Wt 157.0 lb

## 2020-09-16 DIAGNOSIS — K219 Gastro-esophageal reflux disease without esophagitis: Secondary | ICD-10-CM

## 2020-09-16 DIAGNOSIS — E782 Mixed hyperlipidemia: Secondary | ICD-10-CM

## 2020-09-16 DIAGNOSIS — Z79899 Other long term (current) drug therapy: Secondary | ICD-10-CM | POA: Diagnosis not present

## 2020-09-16 DIAGNOSIS — Z1211 Encounter for screening for malignant neoplasm of colon: Secondary | ICD-10-CM

## 2020-09-16 DIAGNOSIS — I700 Atherosclerosis of aorta: Secondary | ICD-10-CM | POA: Diagnosis not present

## 2020-09-16 DIAGNOSIS — R911 Solitary pulmonary nodule: Secondary | ICD-10-CM

## 2020-09-16 DIAGNOSIS — Z87891 Personal history of nicotine dependence: Secondary | ICD-10-CM

## 2020-09-16 DIAGNOSIS — J449 Chronic obstructive pulmonary disease, unspecified: Secondary | ICD-10-CM

## 2020-09-16 DIAGNOSIS — I10 Essential (primary) hypertension: Secondary | ICD-10-CM

## 2020-09-16 DIAGNOSIS — Z0001 Encounter for general adult medical examination with abnormal findings: Secondary | ICD-10-CM | POA: Diagnosis not present

## 2020-09-16 DIAGNOSIS — R6889 Other general symptoms and signs: Secondary | ICD-10-CM | POA: Diagnosis not present

## 2020-09-16 DIAGNOSIS — R7309 Other abnormal glucose: Secondary | ICD-10-CM | POA: Diagnosis not present

## 2020-09-16 DIAGNOSIS — Z0000 Encounter for general adult medical examination without abnormal findings: Secondary | ICD-10-CM

## 2020-09-16 DIAGNOSIS — E0590 Thyrotoxicosis, unspecified without thyrotoxic crisis or storm: Secondary | ICD-10-CM | POA: Diagnosis not present

## 2020-09-16 DIAGNOSIS — L02212 Cutaneous abscess of back [any part, except buttock]: Secondary | ICD-10-CM

## 2020-09-16 DIAGNOSIS — E559 Vitamin D deficiency, unspecified: Secondary | ICD-10-CM

## 2020-09-16 DIAGNOSIS — L723 Sebaceous cyst: Secondary | ICD-10-CM

## 2020-09-16 MED ORDER — OMEPRAZOLE 20 MG PO CPDR: DELAYED_RELEASE_CAPSULE | ORAL | 0 refills | Status: DC

## 2020-09-16 NOTE — Progress Notes (Signed)
MEDICARE ANNUAL WELLNESS VISIT AND 3 MONTH FOLLOW UP Assessment:   Jesus Bush was seen today for medicare wellness, follow-up and insomnia.  Diagnoses and all orders for this visit:  Encounter for Medicare annual wellness exam Yearly  Essential hypertension Continue current medications Monitor blood pressure at home; call if consistently over 130/80 Continue DASH diet.   Reminder to go to the ER if any CP, SOB, nausea, dizziness, severe HA, changes vision/speech, left arm numbness and tingling and jaw pain. -     CBC with Differential/Platelet -     COMPLETE METABOLIC PANEL WITH GFR  Hyperlipidemia, mixed Continue medications: Discussed dietary and exercise modifications Low fat diet -     Lipid panel -     TSH  Vitamin D deficiency Continue supplementation Taking Vitamin D 2,000 IU daily  Chronic obstructive pulmonary disease, unspecified COPD type ([place]) No long smoking; denies daily sx, rare sx managing with albuterol PRN Follow up imaging routinely  Abnormal CT scan of lung Solitary pulmonary nodule Benign per CT follow up Resume routine lung cancer screening annually  Medication management Continued  Seasonal allergic rhinitis due to pollen Doing well at this time  Gastroesophageal reflux disease, unspecified whether esophagitis present Initiated medication for new reflux related symptoms: omeprazole 20 mg BID x 4-12 weeks, then reduce to once daily in the evening Follow up sooner if not improving Discussed diet, avoiding triggers and other lifestyle changes  Sebaceous cyst of back Some fluctuance and tender; will do decompression for comfort Discussed risks of I&D including infection After obtaining verbal permission, prepped with aclohol and betadine 11 inch black stab incision, with sebaceous discharge  Applied topical abx and gauze dressing with instructions given for home care Contact office if any concern with s/s of infection will give abx Reassured  that this represents a benign process. Recommend follow up in 2-4 weeks for full resection with Dr. Melford Aase; he is in agreement with plan of care    Over 30 minutes of face to face interivew, exam, counseling, chart review, and critical decision making was performed Future Appointments  Date Time Provider [place]  01/06/2021 10:00 AM Liane Comber, NP [facility] None  09/18/2021  9:30 AM Liane Comber, NP [facility] None  01/12/2022 11:00 AM Unk Pinto, MD [facility] None    Plan:   During the course of the visit the patient was educated and counseled about appropriate screening and preventive services including:   Pneumococcal vaccine  Influenza vaccine Prevnar 13 Td vaccine Screening electrocardiogram Colorectal cancer screening Diabetes screening Glaucoma screening Nutrition counseling    Subjective:  Jesus Bush is a 75 y.o. male who presents for Medicare Annual Wellness Visit.  He is a former smoker, quit  not longer smoking and used Chantix to help.  He took these for about one month.  He is still tobacco free today.  He is concerned about tender lump to back, has had for many years but growing and more tender in last few weeks with leading in a chair.   He has GERD, reports recently poorly controlled particular in the evening, has severe reflux if lies down sooner than 4-5 hours, with all foods. Reports mainly at night, mild sx during the day only. He been taking OTC med, unsure if famotidine or not, "40 mg of something" at night only.   BMI is Body mass index is 22.53 kg/m., he has been working on diet and exercise. Wt Readings from Last 3 Encounters:  09/16/20 157 lb (71.2 kg)  06/24/20 155 lb 3.2 oz (70.4 kg)  03/11/20 156 lb (70.8 kg)   His blood pressure has been controlled at home, today their BP is BP: 110/64 He does not workout. He denies chest pain, shortness of breath, dizziness.   He has COPD, for mer smoker.  He had CT last year  that showed nodule and due for follow up this year.  His blood pressure has been controlled at home, today their BP is BP: 110/64  He does workout. He denies chest pain, shortness of breath, dizziness.   He is on cholesterol medication (rosuvastin 40 mg daily) and denies myalgias. His cholesterol is at goal. The cholesterol last visit was:   Lab Results  Component Value Date   CHOL 128 02/12/2020   HDL 34 (L) 02/12/2020   LDLCALC 72 02/12/2020   TRIG 135 02/12/2020   CHOLHDL 3.8 02/12/2020    He has been working on diet and exercise for prediabetes, and denies polydipsia, polyuria, visual disturbances, vomiting, and weight loss. Last A1C in the office was:  Lab Results  Component Value Date   HGBA1C 5.9 (H) 11/09/2019   Patient is on Vitamin D supplement.   Lab Results  Component Value Date   VD25OH 48 11/09/2019     He is not on thyroid medication but has been mildly hyperthyroid, monitoring. Lab Results  Component Value Date   TSH 0.14 (L) 02/12/2020  .     Medication Review: Current Outpatient Medications on File Prior to Visit  Medication Sig Dispense Refill   ACCU-CHEK SOFTCLIX LANCETS lancets Check blood sugar 1 time daily-DX-R73.09 100 each 3   albuterol (VENTOLIN HFA) 108 (90 Base) MCG/ACT inhaler 2 inhalations 15 minutes apart every 4 hours to Rescue Asthma 48 g 3   Alcohol Swabs (B-D SINGLE USE SWABS REGULAR) PADS Check blood sugar 1 time daily-DX-R73.09. 100 each 3   aspirin 81 MG tablet Take 81 mg by mouth daily.     benzonatate (TESSALON) 200 MG capsule Take 1 perle 3 x / day to prevent cough 30 capsule 1   Blood Glucose Calibration (ACCU-CHEK AVIVA) SOLN Use per box instruction. 1 each 3   Blood Glucose Monitoring Suppl (ACCU-CHEK AVIVA PLUS) w/Device KIT Check blood sugar 1 time daily-DX-R73.09 1 kit 0   cetirizine (ZYRTEC ALLERGY) 10 MG tablet Take one tablet nightly for allergies. 90 tablet 1   Cholecalciferol (VITAMIN D) 2000 UNITS CAPS Take 2 capsules by  mouth daily.      diltiazem (DILT-XR) 240 MG 24 hr capsule Take  1 capsule  Daily  for BP & Heart Rhythm 90 capsule 3   Fluticasone-Umeclidin-Vilant (TRELEGY ELLIPTA) 200-62.5-25 MCG/INH AEPB Use  1 inhalation  Daily 60 each 0   glucose blood (ACCU-CHEK AVIVA PLUS) test strip Check blood sugar 1 time daily-R73.09. 100 each 3   losartan (COZAAR) 100 MG tablet TAKE 1 TABLET EVERY DAY FOR BLOOD PRESSURE 90 tablet 3   magnesium gluconate (MAGONATE) 500 MG tablet Take 500 mg by mouth daily.      rosuvastatin (CRESTOR) 40 MG tablet Take 1 tablet Daily for Cholesterol 90 tablet 3   No current facility-administered medications on file prior to visit.    Current Problems (verified) Patient Active Problem List   Diagnosis Date Noted   Nodule of left lung 02/05/2019   Hyperthyroidism 06/28/2018   Aortic atherosclerosis ([place]) 09/09/2017   COPD (chronic obstructive pulmonary disease) ([place]) 11/08/2016   Esophageal reflux 04/25/2015   Former smoker (44 pack year hx,  quit 2021) 07/23/2014   Medication management 04/13/2014   Essential hypertension 02/11/2013   Mixed hyperlipidemia 02/11/2013   Vitamin D deficiency 02/11/2013   Abnormal glucose (prediabetes) 05/12/2012    Screening Tests Immunization History  Administered Date(s) Administered   DTaP 05/23/2006   Influenza Whole 12/16/2012   Influenza, High Dose Seasonal PF 01/17/2015, 12/13/2015, 01/29/2017, 12/17/2017, 01/14/2019, 01/27/2020   Influenza-Unspecified 12/27/2013   PFIZER(Purple Top)SARS-COV-2 Vaccination 05/18/2019, 06/08/2019   Pneumococcal Conjugate-13 04/13/2014   Pneumococcal Polysaccharide-23 09/13/2008, 08/06/2016   Td 08/06/2016   Zoster, Live 04/17/2013    Preventative care: Last colonoscopy: 2009, declines further  Cologuard: 08/2017 neg - ordered to complete CT 02/2020 - begnin nodule, follow up 1 year  Prior vaccinations: TD or Tdap: 2018 Influenza: 2019 Pneumococcal: 2018  Prevnar13: 2016 Shingles/Zostavax:  2015, discuss shingrix next   Names of Other Physician/Practitioners you currently use: 1. [facility] here for primary care 2. Dr. Einar Gip, eye doctor, last visit 2022 3. Does not see, dentures, dentist  Patient Care Team: Unk Pinto, MD as PCP - General (Internal Medicine) Unk Pinto, MD (Internal Medicine) Inda Castle, MD (Inactive) as Consulting Physician (Gastroenterology)  Allergies Allergies  Allergen Reactions   Ppd [Tuberculin Purified Protein Derivative]     Positive PPD.    SURGICAL HISTORY He  has a past surgical history that includes Cataract extraction, bilateral (Bilateral, 2018). FAMILY HISTORY His family history includes Diabetes in his sister; Heart attack in his brother, father, and mother; Heart disease in his father, mother, and sister; Hypertension in his brother. SOCIAL HISTORY He  reports that he quit smoking about 15 months ago. His smoking use included cigarettes. He started smoking about 35 years ago. He has a 66.00 pack-year smoking history. He has never used smokeless tobacco. He reports that he does not drink alcohol and does not use drugs.  MEDICARE WELLNESS OBJECTIVES: Physical activity: Current Exercise Habits: Home exercise routine, Time (Minutes): 30, Frequency (Times/Week): 7, Weekly Exercise (Minutes/Week): 210, Intensity: Mild, Exercise limited by: None identified Cardiac risk factors: Cardiac Risk Factors include: advanced age (>35mn, >>69women);dyslipidemia;hypertension;smoking/ tobacco exposure Depression/mood screen:   [facility]  Decreased Interest 0  Down, Depressed, Hopeless 0  PHQ - 2 Score 0    ADLs:  In your present state of health, do you have any difficulty performing the following activities: 09/16/2020  Hearing? N  Vision? N  Difficulty concentrating or making decisions? N  Walking or climbing stairs? N  Dressing or bathing? N  Doing errands,  shopping? N  Some recent data might be hidden      Cognitive Testing  Alert? Yes  Normal Appearance?Yes  Oriented to person? Yes  Place? Yes   Time? Yes  Recall of three objects?  Yes  Can perform simple calculations? Yes  Displays appropriate judgment?Yes  Can read the correct time from a watch face?Yes  EOL planning: Does Patient Have a Medical Advance Directive?: Yes Type of Advance Directive: Living will Does patient want to make changes to medical advance directive?: No - Patient declined   Objective:   Today's Vitals   09/16/20 1002  BP: 110/64  Pulse: 66  Temp: (!) 97.5 F (36.4 C)  SpO2: 93%  Weight: 157 lb (71.2 kg)  Height: '5\' 10"'  (1.778 m)   Body mass index is 22.53 kg/m.  General Appearance: Well nourished, in no apparent distress. Eyes: PERRLA, EOMs, conjunctiva no swelling or erythema Sinuses: No Frontal/maxillary tenderness ENT/Mouth: Ext aud  canals clear, TMs without erythema, bulging. No erythema, swelling, or exudate on post pharynx.  Tonsils not swollen or erythematous. Hearing normal.  Neck: Supple, thyroid normal.  Respiratory: Respiratory effort normal, BS diminished throughout, limited expansion without rales, he does have scattered rhonchi, without wheezing or stridor.  Cardio: RRR with no MRGs. Brisk peripheral pulses without edema.  Abdomen: Soft, + BS.  Non tender, no guarding, rebound, hernias, masses. Lymphatics: Non tender without lymphadenopathy.  Musculoskeletal: Full ROM, 5/5 strength, Normal gait Skin: Warm, dry without rashes, lesions, ecchymosis. He has lump approx 2-3 cm area with some fluctuance and rubbery texture, smooth, mobile to left upper back.  Neuro: Cranial nerves intact. No cerebellar symptoms.  Psych: Awake and oriented X 3, normal affect, Insight and Judgment appropriate.    Medicare Attestation I have personally reviewed: The patient's medical and social history Their use of alcohol, tobacco or illicit drugs Their  current medications and supplements The patient's functional ability including ADLs,fall risks, home safety risks, cognitive, and hearing and visual impairment Diet and physical activities Evidence for depression or mood disorders  The patient's weight, height, BMI, and visual acuity have been recorded in the chart.  I have made referrals, counseling, and provided education to the patient based on review of the above and I have provided the patient with a written personalized care plan for preventive services.     Izora Ribas, NP   07/16/2019

## 2020-09-17 ENCOUNTER — Encounter: Payer: Self-pay | Admitting: Adult Health

## 2020-09-17 DIAGNOSIS — I2510 Atherosclerotic heart disease of native coronary artery without angina pectoris: Secondary | ICD-10-CM | POA: Insufficient documentation

## 2020-09-17 LAB — COMPLETE METABOLIC PANEL WITH GFR
AG Ratio: 1.8 (calc) (ref 1.0–2.5)
ALT: 23 U/L (ref 9–46)
AST: 21 U/L (ref 10–35)
Albumin: 4.5 g/dL (ref 3.6–5.1)
Alkaline phosphatase (APISO): 101 U/L (ref 35–144)
BUN: 9 mg/dL (ref 7–25)
CO2: 30 mmol/L (ref 20–32)
Calcium: 10.6 mg/dL — ABNORMAL HIGH (ref 8.6–10.3)
Chloride: 102 mmol/L (ref 98–110)
Creat: 0.97 mg/dL (ref 0.70–1.18)
GFR, Est African American: 88 mL/min/{1.73_m2} (ref >=60)
GFR, Est Non African American: 76 mL/min/{1.73_m2} (ref >=60)
Globulin: 2.5 g/dL (calc) (ref 1.9–3.7)
Glucose, Bld: 79 mg/dL (ref 65–99)
Potassium: 5 mmol/L (ref 3.5–5.3)
Sodium: 140 mmol/L (ref 135–146)
Total Bilirubin: 0.4 mg/dL (ref 0.2–1.2)
Total Protein: 7 g/dL (ref 6.1–8.1)

## 2020-09-17 LAB — CBC WITH DIFFERENTIAL/PLATELET
Absolute Monocytes: 703 cells/uL (ref 200–950)
Basophils Absolute: 79 cells/uL (ref 0–200)
Basophils Relative: 0.8 %
Eosinophils Absolute: 158 cells/uL (ref 15–500)
Eosinophils Relative: 1.6 %
HCT: 46 % (ref 38.5–50.0)
Hemoglobin: 15.3 g/dL (ref 13.2–17.1)
Lymphs Abs: 1752 cells/uL (ref 850–3900)
MCH: 32.1 pg (ref 27.0–33.0)
MCHC: 33.3 g/dL (ref 32.0–36.0)
MCV: 96.6 fL (ref 80.0–100.0)
MPV: 10.4 fL (ref 7.5–12.5)
Monocytes Relative: 7.1 %
Neutro Abs: 7207 cells/uL (ref 1500–7800)
Neutrophils Relative %: 72.8 %
Platelets: 328 10*3/uL (ref 140–400)
RBC: 4.76 10*6/uL (ref 4.20–5.80)
RDW: 12.8 % (ref 11.0–15.0)
Total Lymphocyte: 17.7 %
WBC: 9.9 10*3/uL (ref 3.8–10.8)

## 2020-09-17 LAB — LIPID PANEL
Cholesterol: 144 mg/dL (ref <200)
HDL: 35 mg/dL — ABNORMAL LOW (ref >=40)
LDL Cholesterol (Calc): 78 mg/dL (calc)
Non-HDL Cholesterol (Calc): 109 mg/dL (calc) (ref <130)
Total CHOL/HDL Ratio: 4.1 (calc) (ref <5.0)
Triglycerides: 226 mg/dL — ABNORMAL HIGH (ref <150)

## 2020-09-17 LAB — TSH: TSH: 0.39 mIU/L — ABNORMAL LOW (ref 0.40–4.50)

## 2020-09-17 LAB — MAGNESIUM: Magnesium: 2.2 mg/dL (ref 1.5–2.5)

## 2020-09-30 ENCOUNTER — Encounter: Payer: Self-pay | Admitting: Internal Medicine

## 2020-09-30 ENCOUNTER — Other Ambulatory Visit: Payer: Self-pay

## 2020-09-30 ENCOUNTER — Ambulatory Visit (INDEPENDENT_AMBULATORY_CARE_PROVIDER_SITE_OTHER): Payer: Medicare HMO | Admitting: Internal Medicine

## 2020-09-30 VITALS — BP 129/71 | HR 77 | Temp 97.5°F | Resp 17 | Ht 70.0 in | Wt 159.0 lb

## 2020-09-30 DIAGNOSIS — L02212 Cutaneous abscess of back [any part, except buttock]: Secondary | ICD-10-CM | POA: Diagnosis not present

## 2020-09-30 MED ORDER — OMEPRAZOLE 40 MG PO CPDR
DELAYED_RELEASE_CAPSULE | ORAL | 3 refills | Status: DC
Start: 2020-09-30 — End: 2021-01-06

## 2020-09-30 MED ORDER — DOXYCYCLINE HYCLATE 100 MG PO CAPS: ORAL_CAPSULE | ORAL | 0 refills | Status: DC

## 2020-09-30 NOTE — Progress Notes (Signed)
   Future Appointments  Date Time Provider [place]  09/30/2020 11:30 AM Unk Pinto, MD [facility] None  01/06/2021 10:00 AM Liane Comber, NP [facility] None  09/18/2021  9:30 AM Liane Comber, NP [facility] None  01/12/2022 11:00 AM Unk Pinto, MD [facility] None    History of Present Illness:    Patient returns for 2 week f/u post I & D of large sebaceous cyst of back for re evaluation.  Medications    diltiazem (DILT-XR) 240 MG 24 hr capsule, Take  1 capsule  Daily  for BP & Heart Rhythm   losartan (COZAAR) 100 MG tablet, TAKE 1 TABLET EVERY DAY FOR BLOOD PRESSURE   rosuvastatin (CRESTOR) 40 MG tablet, Take 1 tablet Daily for Cholesterol   albuterol HFA inhaler, 2 inhalations apart every 4 hours to Rescue Asthma   benzonatate 200 MG capsule, Take 1 perle 3 x / day to prevent cough   cetirizine 10 MG tablet, Take one tablet nightly for allergies.   TRELEGY ELLIPTA)200-62.5-25 MCG/INH AEPB, Use  1 inhalation  Daily   aspirin 81 MG tablet, Take 81 mg by mouth daily.   VITAMIN D2000 UNITS CAPS, Take 2 capsules daily.   magnesium gluconate  500 MG tablet, Take  daily.    omeprazole 20 MG capsule, Take 1 cap 30 min prior to breakfast and dinner.  Problem list He has Abnormal glucose (prediabetes); Essential hypertension; Mixed hyperlipidemia; Vitamin D deficiency; Medication management; Former smoker (44 pack year hx, quit 2021); Esophageal reflux; COPD (chronic obstructive pulmonary disease) ([place]); Aortic atherosclerosis ([place]); Hyperthyroidism; Nodule of left lung; Sebaceous cyst; and Coronary atherosclerosis on their problem list.   Observations/Objective:  BP 129/71   Pulse 77   Temp (!) 97.5 F (36.4 C)   Resp 17   Ht 5\' 10"  (1.778 m)   Wt 159 lb (72.1 kg)   SpO2 93%   BMI 22.81 kg/m   Exam focused on skin of back and finds a ~ 1" x 1.5"  soft cystic subcut cyst of the mid back. There is slight tenderness w/  ? Fluctuance.  Assessment and  Plan:    1. Abscess of back  - doxycycline  100 MG capsule; Take 1 capsule 2 x /day with meals for Infection / Patient knows to take by mouth  Dispense: 42 capsule; Refill: 0  - ROV 3 wk to excise if cyst persists   Follow Up Instructions:      I discussed the assessment and treatment plan with the patient. The patient was provided an opportunity to ask questions and all were answered. The patient agreed with the plan and demonstrated an understanding of the instructions.  Kirtland Bouchard, MD

## 2020-10-02 DIAGNOSIS — Z1211 Encounter for screening for malignant neoplasm of colon: Secondary | ICD-10-CM | POA: Diagnosis not present

## 2020-10-04 LAB — COLOGUARD: Cologuard: NEGATIVE

## 2020-10-07 LAB — EXTERNAL GENERIC LAB PROCEDURE: COLOGUARD: NEGATIVE

## 2020-10-07 LAB — COLOGUARD: COLOGUARD: NEGATIVE

## 2020-10-17 ENCOUNTER — Encounter: Payer: Self-pay | Admitting: Internal Medicine

## 2020-11-10 ENCOUNTER — Encounter: Payer: Medicare HMO | Admitting: Internal Medicine

## 2020-11-11 ENCOUNTER — Other Ambulatory Visit: Payer: Self-pay | Admitting: Internal Medicine

## 2020-11-23 DIAGNOSIS — Z961 Presence of intraocular lens: Secondary | ICD-10-CM | POA: Diagnosis not present

## 2020-11-23 DIAGNOSIS — H5203 Hypermetropia, bilateral: Secondary | ICD-10-CM | POA: Diagnosis not present

## 2020-11-23 DIAGNOSIS — H524 Presbyopia: Secondary | ICD-10-CM | POA: Diagnosis not present

## 2020-11-23 DIAGNOSIS — E119 Type 2 diabetes mellitus without complications: Secondary | ICD-10-CM | POA: Diagnosis not present

## 2020-11-23 DIAGNOSIS — H52223 Regular astigmatism, bilateral: Secondary | ICD-10-CM | POA: Diagnosis not present

## 2020-12-06 ENCOUNTER — Encounter: Payer: Medicare HMO | Admitting: Internal Medicine

## 2020-12-07 ENCOUNTER — Other Ambulatory Visit: Payer: Self-pay | Admitting: Adult Health

## 2021-01-05 NOTE — Progress Notes (Signed)
Complete Physical Exam Assessment:     Diagnoses and all orders for this visit:  Encounter for general adult medical examination with abnormal findings  Due Annually  Essential hypertension Continue current medications Monitor blood pressure at home; call if consistently over 130/80 Continue DASH diet.   Reminder to go to the ER if any CP, SOB, nausea, dizziness, severe HA, changes vision/speech, left arm numbness and tingling and jaw pain -     CBC with Differential/Platelet  Mixed hyperlipidemia Continue medications: Discussed dietary and exercise modifications Low fat diet -     COMPLETE METABOLIC PANEL WITH GFR -     Lipid panel -     TSH  Abnormal glucose (prediabetes) -     Hemoglobin A1c Continue diet and exercise  Aortic atherosclerosis (HCC)  Control blood pressure, lipids and glucose Disscused lifestyle modifications, diet & exercise Continue to monitor  Chronic obstructive pulmonary disease, unspecified COPD type ([place]) Continue Breztri Continue to avoid smoking and irritants  Nodule of right/left lung  CT 02/26/20 benign , nodules unchanged. To have repeat in 1 year  Hyperthyroidism  Check TSH  Gastroesophageal reflux disease, unspecified whether esophagitis present Doing well at this time Diet discussed Monitor for triggers Avoid food with high acid content Avoid excessive cafeine Increase water intake -     Magnesium  Vitamin D deficiency -     VITAMIN D 25 Hydroxy (Vit-D Deficiency, Fractures) Continue Vit D supplementation  Former smoker  Had benign Chest CT 02/2020  Continue to avoid tobacco  Medication management -     CBC with Differential/Platelet -     COMPLETE METABOLIC PANEL WITH GFR -     Lipid panel -     TSH -     Hemoglobin A1c -     VITAMIN D 25 Hydroxy (Vit-D Deficiency, Fractures) -     Magnesium -     EKG 12-Lead -     Urinalysis, Routine w reflex microscopic -     Microalbumin / creatinine urine ratio -      PSA  Screening for ischemic heart disease -     EKG 12-Lead  Screening for hematuria or proteinuria -     Urinalysis, Routine w reflex microscopic -     Microalbumin / creatinine urine ratio  Screening PSA (prostate specific antigen) -     PSA   Bronchitis  CXR  Dexamethasone taper  Z- Pak_0  Continue Breztri daily and Albuterol as needed  Mucinex as needed  If gets extremely short of breath needs to go to the ER.   Return after vacation for flu shot  Over 30 minutes of face to face interivew, exam, counseling, chart review, and critical decision making was performed Future Appointments  Date Time Provider [place]  09/18/2021  9:30 AM Liane Comber, NP [facility] None  01/12/2022 11:00 AM Unk Pinto, MD [facility] None      Subjective:  Jesus Bush is a 75 y.o. male who presents for Medicare Annual Wellness Visit.  He is a former smoker, quit 4-5 months ago. Will smoke an occasional cigarette.  Pt has a productive cough of yellow mucus x 1 month. Denies fever, chills, shortness of breath, wheezing and myalgias.    He has GERD, reports recently poorly controlled particular in the evening, has severe reflux if lies down sooner than 4-5 hours, with all foods. Reports mainly at night, mild sx during the day only. Using Famotidine with control of symptoms  BMI is  Body mass index is 22.5 kg/m., he has been working on diet and exercise. Pt does a lot of walking. Eats a lot of salads Wt Readings from Last 3 Encounters:  01/06/21 154 lb 9.6 oz (70.1 kg)  09/30/20 159 lb (72.1 kg)  09/16/20 157 lb (71.2 kg)   His blood pressure has been controlled at home, today their BP is BP: 132/74 He does not workout. He denies chest pain, shortness of breath, dizziness.   He has COPD, former smoker.  He had CT last year that showed nodule and due for follow up this year.    He is on cholesterol medication (rosuvastin 40 mg daily) and denies myalgias. His cholesterol  is at goal. The cholesterol last visit was:   Lab Results  Component Value Date   CHOL 144 09/16/2020   HDL 35 (L) 09/16/2020   LDLCALC 78 09/16/2020   TRIG 226 (H) 09/16/2020   CHOLHDL 4.1 09/16/2020    He has been working on diet and exercise for prediabetes, and denies polydipsia, polyuria, visual disturbances, vomiting, and weight loss. Last A1C in the office was:  Lab Results  Component Value Date   HGBA1C 5.9 (H) 11/09/2019   Patient is on Vitamin D supplement.   Lab Results  Component Value Date   VD25OH 48 11/09/2019     He is not on thyroid medication but has been mildly hyperthyroid, monitoring. Lab Results  Component Value Date   TSH 0.39 (L) 09/16/2020  .     Medication Review: Current Outpatient Medications on File Prior to Visit  Medication Sig Dispense Refill   ACCU-CHEK SOFTCLIX LANCETS lancets Check blood sugar 1 time daily-DX-R73.09 100 each 3   albuterol (VENTOLIN HFA) 108 (90 Base) MCG/ACT inhaler 2 inhalations 15 minutes apart every 4 hours to Rescue Asthma 48 g 3   Alcohol Swabs (B-D SINGLE USE SWABS REGULAR) PADS Check blood sugar 1 time daily-DX-R73.09. 100 each 3   aspirin 81 MG tablet Take 81 mg by mouth daily.     Blood Glucose Calibration (ACCU-CHEK AVIVA) SOLN Use per box instruction. 1 each 3   Blood Glucose Monitoring Suppl (ACCU-CHEK AVIVA PLUS) w/Device KIT Check blood sugar 1 time daily-DX-R73.09 1 kit 0   cetirizine (ZYRTEC ALLERGY) 10 MG tablet Take one tablet nightly for allergies. 90 tablet 1   Cholecalciferol (VITAMIN D) 2000 UNITS CAPS Take 2 capsules by mouth daily.      diltiazem (DILT-XR) 240 MG 24 hr capsule Take  1 capsule  Daily  for BP & Heart Rhythm 90 capsule 3   famotidine (PEPCID) 40 MG tablet TAKE 1 TABLET DAILY FOR HEART BURN AND ACID INDIGESTION 90 tablet 3   Fluticasone-Umeclidin-Vilant (TRELEGY ELLIPTA) 200-62.5-25 MCG/INH AEPB Use  1 inhalation  Daily 60 each 0   glucose blood (ACCU-CHEK AVIVA PLUS) test strip Check  blood sugar 1 time daily-R73.09. 100 each 3   losartan (COZAAR) 100 MG tablet TAKE 1 TABLET EVERY DAY FOR BLOOD PRESSURE 90 tablet 3   magnesium gluconate (MAGONATE) 500 MG tablet Take 500 mg by mouth daily.      rosuvastatin (CRESTOR) 40 MG tablet Take 1 tablet Daily for Cholesterol 90 tablet 3   doxycycline (VIBRAMYCIN) 100 MG capsule Take 1 capsule 2 x /day with meals for Infection / Patient knows to take by mouth 42 capsule 0   omeprazole (PRILOSEC) 20 MG capsule TAKE 1 CAP 30 MIN PRIOR TO BREAKFAST AND DINNER. (Patient not taking: Reported on 01/06/2021) 180 capsule 3  omeprazole (PRILOSEC) 40 MG capsule Take 1 capsule  with  supper to prevent Indigestion & Acid reflux (Patient not taking: Reported on 01/06/2021) 90 capsule 3   No current facility-administered medications on file prior to visit.    Current Problems (verified) Patient Active Problem List   Diagnosis Date Noted   Coronary atherosclerosis 09/17/2020   Sebaceous cyst 09/16/2020   Nodule of left lung 02/05/2019   Hyperthyroidism 06/28/2018   Aortic atherosclerosis ([place]) 09/09/2017   COPD (chronic obstructive pulmonary disease) ([place]) 11/08/2016   Esophageal reflux 04/25/2015   Former smoker (44 pack year hx, quit 2021) 07/23/2014   Medication management 04/13/2014   Essential hypertension 02/11/2013   Mixed hyperlipidemia 02/11/2013   Vitamin D deficiency 02/11/2013   Abnormal glucose (prediabetes) 05/12/2012    Screening Tests Immunization History  Administered Date(s) Administered   DTaP 05/23/2006   Influenza Whole 12/16/2012   Influenza, High Dose Seasonal PF 01/17/2015, 12/13/2015, 01/29/2017, 12/17/2017, 01/14/2019, 01/27/2020   Influenza-Unspecified 12/27/2013   PFIZER(Purple Top)SARS-COV-2 Vaccination 05/18/2019, 06/08/2019   Pneumococcal Conjugate-13 04/13/2014   Pneumococcal Polysaccharide-23 09/13/2008, 08/06/2016   Td 08/06/2016   Zoster, Live 04/17/2013    Preventative care: Last colonoscopy:  2009, declines further  Cologuard: 10/04/20 negative CT 02/2020 - begnin nodule, follow up 1 year  Prior vaccinations: TD or Tdap: 2018 Influenza: 2019 Pneumococcal: 2018  Prevnar13: 2016 Shingles/Zostavax: 2015, discuss shingrix next   Names of Other Physician/Practitioners you currently use: 1. [facility] here for primary care 2. Dr. Einar Gip, eye doctor, last visit 2022 3. Does not see, dentures, dentist  Patient Care Team: Unk Pinto, MD as PCP - General (Internal Medicine) Unk Pinto, MD (Internal Medicine) Inda Castle, MD (Inactive) as Consulting Physician (Gastroenterology)  Allergies Allergies  Allergen Reactions   Ppd [Tuberculin Purified Protein Derivative]     Positive PPD.    SURGICAL HISTORY He  has a past surgical history that includes Cataract extraction, bilateral (Bilateral, 2018). FAMILY HISTORY His family history includes Diabetes in his sister; Heart attack in his brother, father, and mother; Heart disease in his father, mother, and sister; Hypertension in his brother. SOCIAL HISTORY He  reports that he quit smoking about 18 months ago. His smoking use included cigarettes. He started smoking about 35 years ago. He has a 66.00 pack-year smoking history. He has never used smokeless tobacco. He reports that he does not drink alcohol and does not use drugs. Review of Systems  Constitutional:  Negative for chills and fever.  HENT:  Negative for congestion, hearing loss, sinus pain, sore throat and tinnitus.   Eyes:  Negative for blurred vision and double vision.  Respiratory:  Positive for cough (productive yellow), sputum production (yellow) and wheezing. Negative for hemoptysis and shortness of breath.   Cardiovascular:  Negative for chest pain, palpitations and leg swelling.  Gastrointestinal:  Negative for abdominal pain, constipation, diarrhea, heartburn, nausea and vomiting.  Genitourinary:  Negative  for dysuria and urgency.  Musculoskeletal:  Negative for back pain, falls, joint pain, myalgias and neck pain.  Skin:  Negative for rash.  Neurological:  Negative for dizziness, tingling, tremors, weakness and headaches.  Endo/Heme/Allergies:  Does not bruise/bleed easily.  Psychiatric/Behavioral:  Negative for depression and suicidal ideas. The patient is not nervous/anxious and does not have insomnia.     Objective:   Today's Vitals   01/06/21 0852  BP: 132/74  Pulse: 85  Temp: 97.9 F (36.6 C)  SpO2: 96%  Weight: 154 lb 9.6 oz (  70.1 kg)  Height: 5' 9.5" (1.765 m)    Body mass index is 22.5 kg/m.  General Appearance: Well nourished, in no apparent distress. Eyes: PERRLA, EOMs, conjunctiva no swelling or erythema Sinuses: No Frontal/maxillary tenderness ENT/Mouth: Ext aud canals clear, TMs without erythema, bulging. No erythema, swelling, or exudate on post pharynx.  Tonsils not swollen or erythematous. Hearing normal.  Neck: Supple, thyroid normal.  Respiratory: Respiratory effort normal, BS diminished throughout, limited expansion wheezing and rales heard throughout left lung, no extra sounds heard in right lung. Cardio: RRR with no MRGs. Brisk peripheral pulses without edema.  Abdomen: Soft, + BS.  Non tender, no guarding, rebound, hernias, masses. Lymphatics: Non tender without lymphadenopathy.  Musculoskeletal: Full ROM, 5/5 strength, Normal gait Skin: Warm, dry without rashes, lesions, ecchymosis. He has lump approx 2-3 cm area with some fluctuance and rubbery texture, smooth, mobile to left upper back.  Neuro: Cranial nerves intact. No cerebellar symptoms.  Psych: Awake and oriented X 3, normal affect, Insight and Judgment appropriate.  EKG: Normal sinus rhythm, no ST changes  [facility]  01/06/2021

## 2021-01-06 ENCOUNTER — Encounter: Payer: Medicare HMO | Admitting: Adult Health

## 2021-01-06 ENCOUNTER — Other Ambulatory Visit: Payer: Self-pay

## 2021-01-06 ENCOUNTER — Ambulatory Visit
Admission: RE | Admit: 2021-01-06 | Discharge: 2021-01-06 | Disposition: A | Discharge status: Home / Self Care | Payer: Medicare HMO | Source: Ambulatory Visit | Attending: Nurse Practitioner | Admitting: Nurse Practitioner

## 2021-01-06 ENCOUNTER — Encounter: Payer: Self-pay | Admitting: Nurse Practitioner

## 2021-01-06 ENCOUNTER — Ambulatory Visit (INDEPENDENT_AMBULATORY_CARE_PROVIDER_SITE_OTHER): Payer: Medicare HMO | Admitting: Nurse Practitioner

## 2021-01-06 VITALS — BP 132/74 | HR 85 | Temp 97.9°F | Ht 69.5 in | Wt 154.6 lb

## 2021-01-06 DIAGNOSIS — E559 Vitamin D deficiency, unspecified: Secondary | ICD-10-CM

## 2021-01-06 DIAGNOSIS — J40 Bronchitis, not specified as acute or chronic: Secondary | ICD-10-CM

## 2021-01-06 DIAGNOSIS — R7309 Other abnormal glucose: Secondary | ICD-10-CM

## 2021-01-06 DIAGNOSIS — I700 Atherosclerosis of aorta: Secondary | ICD-10-CM | POA: Diagnosis not present

## 2021-01-06 DIAGNOSIS — R918 Other nonspecific abnormal finding of lung field: Secondary | ICD-10-CM | POA: Diagnosis not present

## 2021-01-06 DIAGNOSIS — Z0000 Encounter for general adult medical examination without abnormal findings: Secondary | ICD-10-CM | POA: Diagnosis not present

## 2021-01-06 DIAGNOSIS — Z1389 Encounter for screening for other disorder: Secondary | ICD-10-CM | POA: Diagnosis not present

## 2021-01-06 DIAGNOSIS — I10 Essential (primary) hypertension: Secondary | ICD-10-CM | POA: Diagnosis not present

## 2021-01-06 DIAGNOSIS — Z79899 Other long term (current) drug therapy: Secondary | ICD-10-CM

## 2021-01-06 DIAGNOSIS — E782 Mixed hyperlipidemia: Secondary | ICD-10-CM

## 2021-01-06 DIAGNOSIS — E0590 Thyrotoxicosis, unspecified without thyrotoxic crisis or storm: Secondary | ICD-10-CM

## 2021-01-06 DIAGNOSIS — Z125 Encounter for screening for malignant neoplasm of prostate: Secondary | ICD-10-CM

## 2021-01-06 DIAGNOSIS — Z136 Encounter for screening for cardiovascular disorders: Secondary | ICD-10-CM

## 2021-01-06 DIAGNOSIS — K219 Gastro-esophageal reflux disease without esophagitis: Secondary | ICD-10-CM | POA: Diagnosis not present

## 2021-01-06 DIAGNOSIS — Z87891 Personal history of nicotine dependence: Secondary | ICD-10-CM

## 2021-01-06 DIAGNOSIS — R911 Solitary pulmonary nodule: Secondary | ICD-10-CM

## 2021-01-06 DIAGNOSIS — Z0001 Encounter for general adult medical examination with abnormal findings: Secondary | ICD-10-CM

## 2021-01-06 DIAGNOSIS — R059 Cough, unspecified: Secondary | ICD-10-CM | POA: Diagnosis not present

## 2021-01-06 DIAGNOSIS — J449 Chronic obstructive pulmonary disease, unspecified: Secondary | ICD-10-CM

## 2021-01-06 MED ORDER — DEXAMETHASONE 1 MG PO TABS: ORAL_TABLET | ORAL | 0 refills | Status: DC

## 2021-01-06 MED ORDER — AZITHROMYCIN 250 MG PO TABS: ORAL_TABLET | ORAL | 1 refills | Status: DC

## 2021-01-06 NOTE — Patient Instructions (Signed)
Chest xray at [facility] Use Mucinex, Albuterol as needed along with Zithromax and Dexamethasone taper If become increasingly short of breath go to ER. Return after vacation for flu shot  Acute Bronchitis, Adult Acute bronchitis is sudden or acute swelling of the air tubes (bronchi) in the lungs. Acute bronchitis causes these tubes to fill with mucus, which can make it hard to breathe. It can also cause coughing or wheezing. In adults, acute bronchitis usually goes away within 2 weeks. A cough caused by bronchitis may last up to 3 weeks. Smoking, allergies, and asthma can make the condition worse. What are the causes? This condition can be caused by germs and by substances that irritate the lungs, including: Cold and flu viruses. The most common cause of this condition is the virus that causes the common cold. Bacteria. Substances that irritate the lungs, including: Smoke from cigarettes and other forms of tobacco. Dust and pollen. Fumes from chemical products, gases, or burned fuel. Other materials that pollute indoor or outdoor air. Close contact with someone who has acute bronchitis. What increases the risk? The following factors may make you more likely to develop this condition: A weak body's defense system, also called the immune system. A condition that affects your lungs and breathing, such as asthma. What are the signs or symptoms? Common symptoms of this condition include: Lung and breathing problems, such as: Coughing. This may bring up clear, yellow, or green mucus from your lungs (sputum). Wheezing. Having too much mucus in your lungs (chest congestion). Having shortness of breath. A fever. Chills. Aches and pains, including: Tightness in your chest and other body aches. A sore throat. How is this diagnosed? This condition is usually diagnosed based on: Your symptoms and medical history. A physical exam. You may also have other tests,  including tests to rule out other conditions, such as pneumonia. These tests include: A test of lung function. Test of a mucus sample to look for the presence of bacteria. Tests to check the oxygen level in your blood. Blood tests. Chest X-ray. How is this treated? Most cases of acute bronchitis clear up over time without treatment. Your health care provider may recommend: Drinking more fluids. This can thin your mucus, which may improve your breathing. Using a device that gets medicine into your lungs (inhaler) to help improve breathing and control coughing. Using a vaporizer or a humidifier. These are machines that add water to the air to help you breathe better. Taking a medicine for a fever. Taking a medicine that thins mucus and clears congestion (expectorant). Taking a medicine that prevents or stops coughing (cough suppressant). Follow these instructions at home: Activity Get plenty of rest. Return to your normal activities as told by your health care provider. Ask your health care provider what activities are safe for you. Lifestyle  Drink enough fluid to keep your urine pale yellow. Do not drink alcohol. Do not use any products that contain nicotine or tobacco, such as cigarettes, e-cigarettes, and chewing tobacco. If you need help quitting, ask your health care provider. Be aware that: Your bronchitis will get worse if you smoke or breathe in other people's smoke (secondhand smoke). Your lungs will heal faster if you quit smoking. General instructions Take over-the-counter and prescription medicines only as told by your health care provider. Use an inhaler, vaporizer, or humidifier as told by your health care provider. If you have a sore throat, gargle with a salt-water mixture 3-4 times a day or  as needed. To make a salt-water mixture, completely dissolve -1 tsp (3-6 g) of salt in 1 cup (237 mL) of warm water. Take two teaspoons of honey at bedtime to lessen coughing at  night. Keep all follow-up visits as told by your health care provider. This is important. How is this prevented? To lower your risk of getting this condition again: Wash your hands often with soap and water. If soap and water are not available, use hand sanitizer. Avoid contact with people who have cold symptoms. Try not to touch your mouth, nose, or eyes with your hands. Avoid places where there are fumes from chemicals. Breathing these fumes will make your condition worse. Get the flu shot every year. Contact a health care provider if: Your symptoms do not improve after 2 weeks of treatment. You vomit more than once or twice. You have symptoms of dehydration such as: Dark urine. Dry skin or eyes. Increased thirst. Headaches. Confusion. Muscle cramps. Get help right away if you: Cough up blood. Feel pain in your chest. Have severe shortness of breath. Faint or keep feeling like you are going to faint. Have a severe headache. Have fever or chills that get worse. These symptoms may represent a serious problem that is an emergency. Do not wait to see if the symptoms will go away. Get medical help right away. Call your local emergency services (911 in the [place] Do not drive yourself to the hospital. Summary Acute bronchitis is sudden (acute) inflammation of the air tubes (bronchi) between the windpipe and the lungs. In adults, acute bronchitis usually goes away within 2 weeks, although coughing may last 3 weeks or longer. Take over-the-counter and prescription medicines only as told by your health care provider. Drink enough fluid to keep your urine pale yellow. Contact a health care provider if your symptoms do not improve after 2 weeks of treatment. Get help right away if you cough up blood, faint, or have chest pain or shortness of breath. This information is not intended to replace advice given to you by your health care provider. Make sure you discuss any questions you have with  your health care provider. Document Revised: 02/10/2020 Document Reviewed: 10/03/2018 Elsevier Patient Education  [place].

## 2021-01-07 ENCOUNTER — Other Ambulatory Visit: Payer: Self-pay | Admitting: Nurse Practitioner

## 2021-01-07 DIAGNOSIS — E0590 Thyrotoxicosis, unspecified without thyrotoxic crisis or storm: Secondary | ICD-10-CM

## 2021-01-07 LAB — MICROALBUMIN / CREATININE URINE RATIO
Creatinine, Urine: 123 mg/dL (ref 20–320)
Microalb Creat Ratio: 2 mcg/mg creat (ref <30)
Microalb, Ur: 0.3 mg/dL

## 2021-01-07 LAB — URINALYSIS, ROUTINE W REFLEX MICROSCOPIC
Bacteria, UA: NONE SEEN /HPF
Bilirubin Urine: NEGATIVE
Glucose, UA: NEGATIVE
Hgb urine dipstick: NEGATIVE
Hyaline Cast: NONE SEEN /LPF
Ketones, ur: NEGATIVE
Nitrite: NEGATIVE
Protein, ur: NEGATIVE
RBC / HPF: NONE SEEN /HPF (ref 0–2)
Specific Gravity, Urine: 1.018 (ref 1.001–1.035)
Squamous Epithelial / HPF: NONE SEEN /HPF (ref <=5)
pH: 7 (ref 5.0–8.0)

## 2021-01-07 LAB — COMPLETE METABOLIC PANEL WITH GFR
AG Ratio: 1.7 (calc) (ref 1.0–2.5)
ALT: 22 U/L (ref 9–46)
AST: 19 U/L (ref 10–35)
Albumin: 4.4 g/dL (ref 3.6–5.1)
Alkaline phosphatase (APISO): 111 U/L (ref 35–144)
BUN: 16 mg/dL (ref 7–25)
CO2: 28 mmol/L (ref 20–32)
Calcium: 9.7 mg/dL (ref 8.6–10.3)
Chloride: 103 mmol/L (ref 98–110)
Creat: 1.19 mg/dL (ref 0.70–1.28)
Globulin: 2.6 g/dL (calc) (ref 1.9–3.7)
Glucose, Bld: 129 mg/dL — ABNORMAL HIGH (ref 65–99)
Potassium: 4.9 mmol/L (ref 3.5–5.3)
Sodium: 140 mmol/L (ref 135–146)
Total Bilirubin: 0.5 mg/dL (ref 0.2–1.2)
Total Protein: 7 g/dL (ref 6.1–8.1)
eGFR: 64 mL/min/{1.73_m2} (ref >=60)

## 2021-01-07 LAB — CBC WITH DIFFERENTIAL/PLATELET
Absolute Monocytes: 630 cells/uL (ref 200–950)
Basophils Absolute: 50 cells/uL (ref 0–200)
Basophils Relative: 0.5 %
Eosinophils Absolute: 60 cells/uL (ref 15–500)
Eosinophils Relative: 0.6 %
HCT: 46.1 % (ref 38.5–50.0)
Hemoglobin: 15 g/dL (ref 13.2–17.1)
Lymphs Abs: 1350 cells/uL (ref 850–3900)
MCH: 31.2 pg (ref 27.0–33.0)
MCHC: 32.5 g/dL (ref 32.0–36.0)
MCV: 95.8 fL (ref 80.0–100.0)
MPV: 11.4 fL (ref 7.5–12.5)
Monocytes Relative: 6.3 %
Neutro Abs: 7910 cells/uL — ABNORMAL HIGH (ref 1500–7800)
Neutrophils Relative %: 79.1 %
Platelets: 238 10*3/uL (ref 140–400)
RBC: 4.81 10*6/uL (ref 4.20–5.80)
RDW: 12.6 % (ref 11.0–15.0)
Total Lymphocyte: 13.5 %
WBC: 10 10*3/uL (ref 3.8–10.8)

## 2021-01-07 LAB — MICROSCOPIC MESSAGE

## 2021-01-07 LAB — LIPID PANEL
Cholesterol: 146 mg/dL (ref <200)
HDL: 37 mg/dL — ABNORMAL LOW (ref >=40)
LDL Cholesterol (Calc): 76 mg/dL (calc)
Non-HDL Cholesterol (Calc): 109 mg/dL (calc) (ref <130)
Total CHOL/HDL Ratio: 3.9 (calc) (ref <5.0)
Triglycerides: 253 mg/dL — ABNORMAL HIGH (ref <150)

## 2021-01-07 LAB — HEMOGLOBIN A1C
Hgb A1c MFr Bld: 6.2 % of total Hgb — ABNORMAL HIGH (ref <5.7)
Mean Plasma Glucose: 131 mg/dL
eAG (mmol/L): 7.3 mmol/L

## 2021-01-07 LAB — PSA: PSA: 2.16 ng/mL (ref <=4.00)

## 2021-01-07 LAB — MAGNESIUM: Magnesium: 2.1 mg/dL (ref 1.5–2.5)

## 2021-01-07 LAB — VITAMIN D 25 HYDROXY (VIT D DEFICIENCY, FRACTURES): Vit D, 25-Hydroxy: 67 ng/mL (ref 30–100)

## 2021-01-07 LAB — TSH: TSH: 0.23 mIU/L — ABNORMAL LOW (ref 0.40–4.50)

## 2021-01-20 ENCOUNTER — Ambulatory Visit
Admission: RE | Admit: 2021-01-20 | Discharge: 2021-01-20 | Disposition: A | Discharge status: Home / Self Care | Payer: Medicare HMO | Source: Ambulatory Visit | Attending: Nurse Practitioner | Admitting: Nurse Practitioner

## 2021-01-20 ENCOUNTER — Other Ambulatory Visit: Payer: Self-pay

## 2021-01-20 DIAGNOSIS — E041 Nontoxic single thyroid nodule: Secondary | ICD-10-CM | POA: Diagnosis not present

## 2021-01-20 DIAGNOSIS — E0590 Thyrotoxicosis, unspecified without thyrotoxic crisis or storm: Secondary | ICD-10-CM

## 2021-01-23 ENCOUNTER — Ambulatory Visit: Payer: Medicare HMO

## 2021-01-24 ENCOUNTER — Ambulatory Visit (INDEPENDENT_AMBULATORY_CARE_PROVIDER_SITE_OTHER): Payer: Medicare HMO

## 2021-01-24 ENCOUNTER — Other Ambulatory Visit: Payer: Self-pay

## 2021-01-24 VITALS — Temp 97.9°F

## 2021-01-24 DIAGNOSIS — Z23 Encounter for immunization: Secondary | ICD-10-CM

## 2021-01-26 ENCOUNTER — Other Ambulatory Visit: Payer: Self-pay | Admitting: Internal Medicine

## 2021-03-22 ENCOUNTER — Other Ambulatory Visit: Payer: Self-pay | Admitting: Adult Health

## 2021-04-18 NOTE — Progress Notes (Signed)
Assessment and Plan:  Jesus Bush was seen today for acute visit.  Diagnoses and all orders for this visit:  Abnormal lung sounds -     DG Chest 2 View; Future  Chronic obstructive pulmonary disease, unspecified COPD type ([place]) -     DG Chest 2 View; Future Reinstructed on need to use Trelegy daily and albuterol as needed Strongly encouraged to not restart smoking- has not smoked for 2 weeks  Acute cough -     DG Chest 2 View; Future -     levofloxacin (LEVAQUIN) 750 MG tablet; Take 1 tablet (750 mg total) by mouth daily for 10 days. -     predniSONE (DELTASONE) 20 MG tablet; 3 tablets daily with food for 3 days, 2 tabs daily for 3 days, 1 tab a day for 5 days. -     promethazine-dextromethorphan (PROMETHAZINE-DM) 6.25-15 MG/5ML syrup; Take 5 mLs by mouth 4 (four) times daily as needed for cough. Push fluids If develop fever , shortness of breath,  O2 sat < 90% go to the ER If symptoms are not improved in the next 10 days notify the office  Bronchitis -     levofloxacin (LEVAQUIN) 750 MG tablet; Take 1 tablet (750 mg total) by mouth daily for 10 days. -     predniSONE (DELTASONE) 20 MG tablet; 3 tablets daily with food for 3 days, 2 tabs daily for 3 days, 1 tab a day for 5 days. -     promethazine-dextromethorphan (PROMETHAZINE-DM) 6.25-15 MG/5ML syrup; Take 5 mLs by mouth 4 (four) times daily as needed for cough.  Nebulizer treatment performed in office today with DuoNeb, minimal clearing of crackles, wheezing persists      Further disposition pending results of labs. Discussed med's effects and SE's.   Over 30 minutes of exam, counseling, chart review, and critical decision making was performed.   Future Appointments  Date Time Provider [place]  09/18/2021  9:30 AM Liane Comber, NP [facility] None  01/08/2022 11:00 AM Unk Pinto, MD [facility] None     ------------------------------------------------------------------------------------------------------------------   HPI BP (!) 110/58    Pulse 88    Temp 97.7 F (36.5 C)    Wt 161 lb (73 kg)    SpO2 93%    BMI 23.43 kg/m  75 y.o.male presents for productive cough of yellow phlegm x 1 week. He has used over the counter robitussin.  Denies fever, shortness of breath, head congestion, nausea, vomiting and diarrhea. States he has not smoked for 2 weeks.  He has not been using his Trelegy inhaler daily- is only using as needed.  Past Medical History:  Diagnosis Date   Diabetes mellitus without complication ([place])    Hyperlipidemia    Hypertension    Vitamin D deficiency      Allergies  Allergen Reactions   Ppd [Tuberculin Purified Protein Derivative]     Positive PPD.    Current Outpatient Medications on File Prior to Visit  Medication Sig   albuterol (VENTOLIN HFA) 108 (90 Base) MCG/ACT inhaler 2 inhalations 15 minutes apart every 4 hours to Rescue Asthma   aspirin 81 MG tablet Take 81 mg by mouth daily.   cetirizine (ZYRTEC ALLERGY) 10 MG tablet Take one tablet nightly for allergies.   Cholecalciferol (VITAMIN D) 2000 UNITS CAPS Take 2 capsules by mouth daily.    diltiazem (DILT-XR) 240 MG 24 hr capsule Take  1 capsule  Daily  for BP & Heart Rhythm   famotidine (PEPCID) 40 MG  tablet TAKE 1 TABLET DAILY FOR HEART BURN AND ACID INDIGESTION   Fluticasone-Umeclidin-Vilant (TRELEGY ELLIPTA) 200-62.5-25 MCG/INH AEPB Use  1 inhalation  Daily   losartan (COZAAR) 100 MG tablet TAKE 1 TABLET EVERY DAY FOR BLOOD PRESSURE   magnesium gluconate (MAGONATE) 500 MG tablet Take 500 mg by mouth daily.    rosuvastatin (CRESTOR) 40 MG tablet TAKE 1 TABLET DAILY FOR CHOLESTEROL   ACCU-CHEK SOFTCLIX LANCETS lancets Check blood sugar 1 time daily-DX-R73.09   Alcohol Swabs (B-D SINGLE USE SWABS REGULAR) PADS Check blood sugar 1 time daily-DX-R73.09.   azithromycin (ZITHROMAX) 250 MG tablet Take 2  tablets (500 mg) on  Day 1,  followed by 1 tablet (250 mg) once daily on Days 2 through 5. (Patient not taking: Reported on 04/19/2021)   Blood Glucose Calibration (ACCU-CHEK AVIVA) SOLN Use per box instruction.   Blood Glucose Monitoring Suppl (ACCU-CHEK AVIVA PLUS) w/Device KIT Check blood sugar 1 time daily-DX-R73.09   dexamethasone (DECADRON) 1 MG tablet Take 3 tabs for 3 days, 2 tabs for 3 days 1 tab for 5 days. Take with food. (Patient not taking: Reported on 04/19/2021)   glucose blood (ACCU-CHEK AVIVA PLUS) test strip Check blood sugar 1 time daily-R73.09.   No current facility-administered medications on file prior to visit.    ROS: all negative except above.   Physical Exam:  BP (!) 110/58    Pulse 88    Temp 97.7 F (36.5 C)    Wt 161 lb (73 kg)    SpO2 93%    BMI 23.43 kg/m   General Appearance: Well nourished, in no apparent distress. Eyes: PERRLA, EOMs, conjunctiva no swelling or erythema Sinuses: No Frontal/maxillary tenderness ENT/Mouth: Ext aud canals clear, TMs without erythema, bulging. No erythema, swelling, or exudate on post pharynx.  Tonsils not swollen or erythematous. Hearing normal.  Neck: Supple, thyroid normal.  Respiratory: Respiratory effort normal, BS crackles lower lobes bilaterally and expiratory wheezing throughout Cardio: RRR with no MRGs. Brisk peripheral pulses without edema.  Abdomen: Soft, + BS.  Non tender, no guarding, rebound, hernias, masses. Lymphatics: Non tender without lymphadenopathy.  Musculoskeletal: Full ROM, 5/5 strength, normal gait.  Skin: Warm, dry without rashes, lesions, ecchymosis.  Neuro: Cranial nerves intact. Normal muscle tone, no cerebellar symptoms. Sensation intact.  Psych: Awake and oriented X 3, normal affect, Insight and Judgment appropriate.  Nebulizer treatment performed in office today with DuoNeb, minimal clearing of crackles, wheezing persists     [place], NP 11:13 AM [facility]

## 2021-04-19 ENCOUNTER — Ambulatory Visit
Admission: RE | Admit: 2021-04-19 | Discharge: 2021-04-19 | Disposition: A | Discharge status: Home / Self Care | Payer: Medicare HMO | Source: Ambulatory Visit | Attending: Nurse Practitioner | Admitting: Nurse Practitioner

## 2021-04-19 ENCOUNTER — Other Ambulatory Visit: Payer: Self-pay

## 2021-04-19 ENCOUNTER — Encounter: Payer: Self-pay | Admitting: Nurse Practitioner

## 2021-04-19 ENCOUNTER — Ambulatory Visit (INDEPENDENT_AMBULATORY_CARE_PROVIDER_SITE_OTHER): Payer: Medicare HMO | Admitting: Nurse Practitioner

## 2021-04-19 VITALS — BP 110/58 | HR 88 | Temp 97.7°F | Wt 161.0 lb

## 2021-04-19 DIAGNOSIS — J40 Bronchitis, not specified as acute or chronic: Secondary | ICD-10-CM

## 2021-04-19 DIAGNOSIS — R0989 Other specified symptoms and signs involving the circulatory and respiratory systems: Secondary | ICD-10-CM

## 2021-04-19 DIAGNOSIS — J449 Chronic obstructive pulmonary disease, unspecified: Secondary | ICD-10-CM

## 2021-04-19 DIAGNOSIS — R918 Other nonspecific abnormal finding of lung field: Secondary | ICD-10-CM | POA: Diagnosis not present

## 2021-04-19 DIAGNOSIS — R059 Cough, unspecified: Secondary | ICD-10-CM | POA: Diagnosis not present

## 2021-04-19 DIAGNOSIS — R051 Acute cough: Secondary | ICD-10-CM

## 2021-04-19 DIAGNOSIS — I700 Atherosclerosis of aorta: Secondary | ICD-10-CM | POA: Diagnosis not present

## 2021-04-19 MED ORDER — LEVOFLOXACIN 750 MG PO TABS: 750.0000 mg | ORAL_TABLET | Freq: Every day | ORAL | 0 refills | Status: AC

## 2021-04-19 MED ORDER — PROMETHAZINE-DM 6.25-15 MG/5ML PO SYRP
5.0000 mL | ORAL_SOLUTION | Freq: Four times a day (QID) | ORAL | 1 refills | Status: DC | PRN
Start: 2021-04-19 — End: 2021-05-18

## 2021-04-19 MED ORDER — PREDNISONE 20 MG PO TABS: ORAL_TABLET | ORAL | 0 refills | Status: AC

## 2021-04-19 NOTE — Patient Instructions (Addendum)
[facility]  [place] for Chest Xray  Fluticasone; Umeclidinium; Vilanterol inhalation powder What is this medication? FLUTICASONE; UMECLIDINIUM; VILANTEROL (floo TIK a sone; ue MEK li DIN ee um; vye LAN ter ol) inhalation is a combination of 3 drugs to treat COPD and asthma. Umeclidinium and Vilanterol are bronchodilators that help keep airways open. Fluticasone decreases inflammation in the lungs. Do not use this drug combination for acute asthma attacks or bronchospasm. This medicine may be used for other purposes; ask your health care provider or pharmacist if you have questions. COMMON BRAND NAME(S): TRELEGY ELLIPTA What should I tell my care team before I take this medication? They need to know if you have any of these conditions: bone problems diabetes eye disease, vision problems heart disease high blood pressure history of irregular heartbeat immune system problems infection kidney disease pheochromocytoma prostate disease seizures thyroid disease trouble passing urine an unusual or allergic reaction to fluticasone, umeclidinium, vilanterol, lactose, milk proteins, other medicines, foods, dyes, or preservatives pregnant or trying to get pregnant breast-feeding How should I use this medication? This drug is inhaled through the mouth. Rinse your mouth with water after use. Make sure not to swallow the water. Take it as directed on the prescription label at the same time every day. Do not use it more often than directed. A special MedGuide will be given to you by the pharmacist with each prescription and refill. Be sure to read this information carefully each time. Talk to your pediatrician about the use of this drug in children. Special care may be needed. Overdosage: If you think you have taken too much of this medicine contact a poison control center or emergency room at once. NOTE: This medicine is only for you. Do not share this medicine with  others. What if I miss a dose? If you miss a dose, take it as soon as you can. If it is almost time for your next dose, take only that dose. Do not take double or extra doses. What may interact with this medication? Do not take this medicine with any of the following medications: cisapride dofetilide dronedarone MAOIs like Carbex, Eldepryl, Marplan, Nardil, and Parnate pimozide thioridazine ziprasidone This medicine may also interact with the following medications: aclidinium antihistamines for allergy antiviral medicines for HIV or AIDS atropine beta-blockers like metoprolol and propranolol certain antibiotics like clarithromycin and telithromycin certain medicines for bladder problems like oxybutynin, tolterodine certain medicines for depression, anxiety, or psychotic disturbances certain medicines for fungal infections like ketoconazole, itraconazole, posaconazole, voriconazole certain medicines for Parkinson's disease like benztropine, trihexyphenidyl certain medicines for stomach problems like dicyclomine, hyoscyamine certain medicines for travel sickness like scopolamine conivaptan diuretics ipratropium medicines for colds other medicines for breathing problems other medicines that prolong the QT interval (cause an abnormal heart rhythm) nefazodone tiotropium This list may not describe all possible interactions. Give your health care provider a list of all the medicines, herbs, non-prescription drugs, or dietary supplements you use. Also tell them if you smoke, drink alcohol, or use illegal drugs. Some items may interact with your medicine. What should I watch for while using this medication? Visit your doctor or health care professional for regular checkups. Tell your doctor or health care professional if your symptoms do not get better. Do not use this medicine more than once every 24 hours. NEVER use this medicine for an acute asthma or COPD attack. You should use your  short-acting rescue inhalers for this purpose. If your symptoms get worse or  if you need your short-acting inhalers more often, call your doctor right away. If you are going to have surgery tell your doctor or health care professional that you are using this medicine. Try not to come in contact with people with the chicken pox or measles. If you do, call your doctor. This medicine may increase blood sugar. Ask your healthcare provider if changes in diet or medicines are needed if you have diabetes. What side effects may I notice from receiving this medication? Side effects that you should report to your doctor or health care professional as soon as possible: allergic reactions like skin rash or hives, swelling of the face, lips, or tongue breathing problems right after inhaling your medicine chest pain eye pain fast, irregular heartbeat feeling faint or lightheaded, falls fever or chills nausea, vomiting signs and symptoms of high blood sugar such as being more thirsty or hungry or having to urinate more than normal. You may also feel very tired or have blurry vision. trouble passing urine Side effects that usually do not require medical attention (report these to your doctor or health care professional if they continue or are bothersome): back pain changes in taste cough diarrhea headache nervousness sore throat tremor This list may not describe all possible side effects. Call your doctor for medical advice about side effects. You may report side effects to FDA at 1-800-FDA-1088. Where should I keep my medication? Keep out of the reach of children and pets. Store at room temperature between 20 and 25 degrees C (68 and 77 degrees F). Keep inhaler away from extreme heat, cold or humidity. Throw away 6 weeks after removing it from the foil pouch, when the dose counter reads "0" or after the expiration date, whichever is first. NOTE: This sheet is a summary. It may not cover all possible  information. If you have questions about this medicine, talk to your doctor, pharmacist, or health care provider.  2022 Elsevier/Gold Standard (2020-11-29 00:00:00)

## 2021-04-20 ENCOUNTER — Other Ambulatory Visit: Payer: Self-pay | Admitting: Nurse Practitioner

## 2021-04-20 DIAGNOSIS — R918 Other nonspecific abnormal finding of lung field: Secondary | ICD-10-CM

## 2021-04-20 DIAGNOSIS — R9389 Abnormal findings on diagnostic imaging of other specified body structures: Secondary | ICD-10-CM

## 2021-05-15 ENCOUNTER — Ambulatory Visit
Admission: RE | Admit: 2021-05-15 | Discharge: 2021-05-15 | Disposition: A | Discharge status: Home / Self Care | Payer: Medicare HMO | Source: Ambulatory Visit | Attending: Nurse Practitioner | Admitting: Nurse Practitioner

## 2021-05-15 ENCOUNTER — Other Ambulatory Visit: Payer: Self-pay

## 2021-05-15 ENCOUNTER — Other Ambulatory Visit: Payer: Self-pay | Admitting: Nurse Practitioner

## 2021-05-15 DIAGNOSIS — R9389 Abnormal findings on diagnostic imaging of other specified body structures: Secondary | ICD-10-CM

## 2021-05-15 DIAGNOSIS — R918 Other nonspecific abnormal finding of lung field: Secondary | ICD-10-CM | POA: Diagnosis not present

## 2021-05-17 ENCOUNTER — Ambulatory Visit
Admission: RE | Admit: 2021-05-17 | Discharge: 2021-05-17 | Disposition: A | Discharge status: Home / Self Care | Payer: Medicare HMO | Source: Ambulatory Visit | Attending: Nurse Practitioner | Admitting: Nurse Practitioner

## 2021-05-17 DIAGNOSIS — J9811 Atelectasis: Secondary | ICD-10-CM | POA: Diagnosis not present

## 2021-05-17 DIAGNOSIS — J479 Bronchiectasis, uncomplicated: Secondary | ICD-10-CM | POA: Diagnosis not present

## 2021-05-17 DIAGNOSIS — J439 Emphysema, unspecified: Secondary | ICD-10-CM | POA: Diagnosis not present

## 2021-05-17 DIAGNOSIS — R9389 Abnormal findings on diagnostic imaging of other specified body structures: Secondary | ICD-10-CM

## 2021-05-18 ENCOUNTER — Other Ambulatory Visit: Payer: Self-pay | Admitting: Nurse Practitioner

## 2021-05-18 ENCOUNTER — Encounter: Payer: Self-pay | Admitting: Internal Medicine

## 2021-05-18 DIAGNOSIS — R051 Acute cough: Secondary | ICD-10-CM

## 2021-05-18 MED ORDER — PROMETHAZINE-CODEINE 6.25-10 MG/5ML PO SYRP: 5.0000 mL | ORAL_SOLUTION | Freq: Four times a day (QID) | ORAL | 0 refills | Status: DC | PRN

## 2021-05-19 ENCOUNTER — Other Ambulatory Visit: Payer: Self-pay | Admitting: Nurse Practitioner

## 2021-05-19 DIAGNOSIS — R051 Acute cough: Secondary | ICD-10-CM

## 2021-05-19 DIAGNOSIS — K219 Gastro-esophageal reflux disease without esophagitis: Secondary | ICD-10-CM

## 2021-05-19 DIAGNOSIS — J471 Bronchiectasis with (acute) exacerbation: Secondary | ICD-10-CM

## 2021-05-19 MED ORDER — PROMETHAZINE-CODEINE 6.25-10 MG/5ML PO SYRP: 5.0000 mL | ORAL_SOLUTION | Freq: Four times a day (QID) | ORAL | 0 refills | Status: DC | PRN

## 2021-05-19 MED ORDER — PANTOPRAZOLE SODIUM 40 MG PO TBEC: 40.0000 mg | DELAYED_RELEASE_TABLET | Freq: Every day | ORAL | 1 refills | Status: DC

## 2021-05-19 MED ORDER — CIPROFLOXACIN HCL 500 MG PO TABS
500.0000 mg | ORAL_TABLET | Freq: Two times a day (BID) | ORAL | 0 refills | Status: AC
Start: 2021-05-19 — End: 2021-05-29

## 2021-07-04 ENCOUNTER — Encounter: Payer: Self-pay | Admitting: Nurse Practitioner

## 2021-07-04 ENCOUNTER — Ambulatory Visit (INDEPENDENT_AMBULATORY_CARE_PROVIDER_SITE_OTHER): Payer: Medicare HMO | Admitting: Nurse Practitioner

## 2021-07-04 ENCOUNTER — Telehealth: Payer: Self-pay | Admitting: Nurse Practitioner

## 2021-07-04 VITALS — BP 138/78 | HR 74 | Temp 97.9°F | Resp 18 | Ht 69.5 in | Wt 162.0 lb

## 2021-07-04 DIAGNOSIS — J449 Chronic obstructive pulmonary disease, unspecified: Secondary | ICD-10-CM

## 2021-07-04 DIAGNOSIS — F17200 Nicotine dependence, unspecified, uncomplicated: Secondary | ICD-10-CM

## 2021-07-04 DIAGNOSIS — R0989 Other specified symptoms and signs involving the circulatory and respiratory systems: Secondary | ICD-10-CM

## 2021-07-04 DIAGNOSIS — J3089 Other allergic rhinitis: Secondary | ICD-10-CM | POA: Diagnosis not present

## 2021-07-04 DIAGNOSIS — R062 Wheezing: Secondary | ICD-10-CM | POA: Diagnosis not present

## 2021-07-04 DIAGNOSIS — R053 Chronic cough: Secondary | ICD-10-CM

## 2021-07-04 DIAGNOSIS — J0181 Other acute recurrent sinusitis: Secondary | ICD-10-CM

## 2021-07-04 DIAGNOSIS — E119 Type 2 diabetes mellitus without complications: Secondary | ICD-10-CM | POA: Diagnosis not present

## 2021-07-04 MED ORDER — IPRATROPIUM-ALBUTEROL 0.5-2.5 (3) MG/3ML IN SOLN: RESPIRATORY_TRACT | 2 refills | Status: DC

## 2021-07-04 MED ORDER — PROMETHAZINE-DM 6.25-15 MG/5ML PO SYRP: 5.0000 mL | ORAL_SOLUTION | Freq: Four times a day (QID) | ORAL | 0 refills | Status: DC | PRN

## 2021-07-04 NOTE — Telephone Encounter (Signed)
Wife called saying that the pharmacy has a backorder on Albuterol solution and she realized that they don't even have a nebulizer. Wanting [place] to write a prescription for the nebulizer and solution to go to [facility]. Thank you! ?

## 2021-07-04 NOTE — Progress Notes (Signed)
Assessment and Plan: ? ?Jesus Bush was seen today for an episodic visit. ? ?Diagnoses and all orders for this visit: ? ?1. Chronic obstructive pulmonary disease, unspecified COPD type ([place]) ? ?Discussed importance of using Trelegy inhaler daily as directed and Albuterol PRN. ?Smoking Cessation discussed. ? ?2. Chronic cough ? ?Start Promethazine DM ?Stay well hydrated to keep mucus thin and productive. ? ?- promethazine-dextromethorphan (PROMETHAZINE-DM) 6.25-15 MG/5ML syrup; Take 5 mLs by mouth 4 (four) times daily as needed for cough.  Dispense: 240 mL; Refill: 0 ? ?3. Scattered rhonchi of right lung/Wheezing on right side of chest on exhalation ? ?Administered duo-neb.   ?Diminished wheezing and rhonchi. ?Continue Trelegy and Albuterol as directed. ? ?- ipratropium-albuterol (DUONEB) 0.5-2.5 (3) MG/3ML SOLN; Inhale 1 vial (3 mL) every 4 to 6 hours PRN.  Dispense: 360 mL; Refill: 2 ? ?5. Seasonal allergic rhinitis due to other allergic trigger ? ?Change Zyrtec to Allegra. ?May purchase OTC.   ? ?6. Current every day smoker ? ?Smoking cessation instruction/counseling given:  counseled patient on the dangers of tobacco use, advised patient to stop smoking, and reviewed strategies to maximize success  ? ? ?Further disposition pending results of labs. Discussed med's effects and SE's.   ?Over 30 minutes of exam, counseling, chart review, and critical decision making was performed.  ? ?Future Appointments  ?Date Time Provider [place]  ?09/18/2021  9:30 AM Jesus Comber, NP [facility] None  ?01/08/2022 11:00 AM Unk Pinto, MD [facility] None  ? ? ?------------------------------------------------------------------------------------------------------------------ ? ? ?HPI ?BP 138/78   Pulse 74   Temp 97.9 ?F (36.6 ?C)   Resp 18   Ht 5' 9.5" (1.765 m)   Wt 162 lb (73.5 kg)   SpO2 97%   BMI 23.58 kg/m?  ? ?76 y.o.male presents with reports of ongoing cough for two months.  Was recently treated  for PNA 03/2021, was prescribed abx and steroids. Reports taking in full. Shown to be resolved with updated Chest CT 04/2021.  He shares that he continues to smoke cigarettes daily. Reports paroxymal coughing.  Promethazine DM syrup has helped in the past.  He is prescribed Trelegy and uses PRN along with Albuterol which he does not use.  He states 2 weeks ago he went back to work and "breathed in dust" which triggered an allergy attack and caused him to have more sinus and chest congestion.  Reports taking Zyrtec daily but this does not help.   Does not follow with Pulmonology.  Hx states hx of postivie TB test in the past.  Denies fever, chills.    ? ? ?Past Medical History:  ?Diagnosis Date  ? Diabetes mellitus without complication ([place])   ? Hyperlipidemia   ? Hypertension   ? Vitamin D deficiency   ?  ? ?Allergies  ?Allergen Reactions  ? Ppd [Tuberculin Purified Protein Derivative]   ?  Positive PPD.  ? ? ?Current Outpatient Medications on File Prior to Visit  ?Medication Sig  ? ACCU-CHEK SOFTCLIX LANCETS lancets Check blood sugar 1 time daily-DX-R73.09  ? albuterol (VENTOLIN HFA) 108 (90 Base) MCG/ACT inhaler 2 inhalations 15 minutes apart every 4 hours to Rescue Asthma  ? Alcohol Swabs (B-D SINGLE USE SWABS REGULAR) PADS Check blood sugar 1 time daily-DX-R73.09.  ? aspirin 81 MG tablet Take 81 mg by mouth daily.  ? Blood Glucose Calibration (ACCU-CHEK AVIVA) SOLN Use per box instruction.  ? Blood Glucose Monitoring Suppl (ACCU-CHEK AVIVA PLUS) w/Device KIT Check blood sugar 1 time daily-DX-R73.09  ?  cetirizine (ZYRTEC ALLERGY) 10 MG tablet Take one tablet nightly for allergies.  ? Cholecalciferol (VITAMIN D) 2000 UNITS CAPS Take 2 capsules by mouth daily.   ? diltiazem (DILT-XR) 240 MG 24 hr capsule Take  1 capsule  Daily  for BP & Heart Rhythm  ? famotidine (PEPCID) 40 MG tablet TAKE 1 TABLET DAILY FOR HEART BURN AND ACID INDIGESTION  ? Fluticasone-Umeclidin-Vilant (TRELEGY ELLIPTA) 200-62.5-25 MCG/INH AEPB Use   1 inhalation  Daily  ? glucose blood (ACCU-CHEK AVIVA PLUS) test strip Check blood sugar 1 time daily-R73.09.  ? losartan (COZAAR) 100 MG tablet TAKE 1 TABLET EVERY DAY FOR BLOOD PRESSURE  ? magnesium gluconate (MAGONATE) 500 MG tablet Take 500 mg by mouth daily.   ? pantoprazole (PROTONIX) 40 MG tablet Take 1 tablet (40 mg total) by mouth daily.  ? promethazine-codeine (PHENERGAN WITH CODEINE) 6.25-10 MG/5ML syrup Take 5 mLs by mouth every 6 (six) hours as needed for cough.  ? rosuvastatin (CRESTOR) 40 MG tablet TAKE 1 TABLET DAILY FOR CHOLESTEROL  ? ?No current facility-administered medications on file prior to visit.  ? ? ?ROS: all negative except what is noted in the HPI.  ? ?Physical Exam: ? ?BP 138/78   Pulse 74   Temp 97.9 ?F (36.6 ?C)   Resp 18   Ht 5' 9.5" (1.765 m)   Wt 162 lb (73.5 kg)   SpO2 97%   BMI 23.58 kg/m?  ? ?General Appearance: NAD.  Awake, conversant and cooperative. ?Eyes: PERRLA, EOMs intact.  Sclera white.  Conjunctiva without erythema. ?Sinuses: No frontal/maxillary tenderness.  No nasal discharge. Nares patent.  ?ENT/Mouth: Ext aud canals clear.  Bilateral TMs w/DOL and without erythema or bulging. Hearing intact.  Posterior pharynx without swelling or exudate.  Tonsils without swelling or erythema.  ?Neck: Supple.  No masses, nodules or thyromegaly. ?Respiratory: Right posterior and anterior lung fields with scattered expiratory and inspiratory rhonchi and wheezing.  Effort is regular with non-labored breathing.  ?Cardio: RRR with no MRGs. Brisk peripheral pulses without edema.  ?Abdomen: Active BS in all four quadrants.  Soft and non-tender without guarding, rebound tenderness, hernias or masses. ?Lymphatics: Non tender without lymphadenopathy.  ?Musculoskeletal: Full ROM, 5/5 strength, normal ambulation.  No clubbing or cyanosis. ?Skin: Appropriate color for ethnicity. Warm without rashes, lesions, ecchymosis, ulcers.  ?Neuro: CN II-XII grossly normal. Normal muscle tone without  cerebellar symptoms and intact sensation.   ?Psych: AO X 3,  appropriate mood and affect, insight and judgment.  ?  ? ?Darrol Jump, NP ?9:15 AM ?[facility] ? ?

## 2021-07-04 NOTE — Patient Instructions (Signed)

## 2021-07-06 ENCOUNTER — Other Ambulatory Visit: Payer: Self-pay | Admitting: Nurse Practitioner

## 2021-07-06 DIAGNOSIS — R062 Wheezing: Secondary | ICD-10-CM

## 2021-07-06 DIAGNOSIS — J449 Chronic obstructive pulmonary disease, unspecified: Secondary | ICD-10-CM

## 2021-07-06 DIAGNOSIS — R0989 Other specified symptoms and signs involving the circulatory and respiratory systems: Secondary | ICD-10-CM

## 2021-07-06 MED ORDER — IPRATROPIUM-ALBUTEROL 0.5-2.5 (3) MG/3ML IN SOLN: RESPIRATORY_TRACT | 2 refills | Status: DC

## 2021-07-07 ENCOUNTER — Other Ambulatory Visit: Payer: Self-pay | Admitting: Nurse Practitioner

## 2021-07-07 MED ORDER — IPRATROPIUM-ALBUTEROL 0.5-2.5 (3) MG/3ML IN SOLN: RESPIRATORY_TRACT | 2 refills | Status: DC

## 2021-07-08 ENCOUNTER — Encounter: Payer: Self-pay | Admitting: Internal Medicine

## 2021-07-08 ENCOUNTER — Other Ambulatory Visit: Payer: Self-pay | Admitting: Internal Medicine

## 2021-07-08 MED ORDER — TRELEGY ELLIPTA 200-62.5-25 MCG/ACT IN AEPB: INHALATION_SPRAY | RESPIRATORY_TRACT | 3 refills | Status: DC

## 2021-07-10 ENCOUNTER — Other Ambulatory Visit: Payer: Self-pay | Admitting: Nurse Practitioner

## 2021-07-10 DIAGNOSIS — J449 Chronic obstructive pulmonary disease, unspecified: Secondary | ICD-10-CM

## 2021-07-10 MED ORDER — IPRATROPIUM-ALBUTEROL 0.5-2.5 (3) MG/3ML IN SOLN: RESPIRATORY_TRACT | 2 refills | Status: DC

## 2021-07-10 NOTE — Telephone Encounter (Signed)
He saw [place]

## 2021-07-11 ENCOUNTER — Other Ambulatory Visit: Payer: Self-pay | Admitting: Nurse Practitioner

## 2021-07-11 DIAGNOSIS — J431 Panlobular emphysema: Secondary | ICD-10-CM

## 2021-07-11 DIAGNOSIS — J449 Chronic obstructive pulmonary disease, unspecified: Secondary | ICD-10-CM

## 2021-07-11 MED ORDER — DOXYCYCLINE HYCLATE 100 MG PO CAPS: ORAL_CAPSULE | ORAL | 0 refills | Status: DC

## 2021-07-11 MED ORDER — TRELEGY ELLIPTA 200-62.5-25 MCG/ACT IN AEPB: INHALATION_SPRAY | RESPIRATORY_TRACT | 3 refills | Status: DC

## 2021-07-11 MED ORDER — ALBUTEROL SULFATE HFA 108 (90 BASE) MCG/ACT IN AERS: INHALATION_SPRAY | RESPIRATORY_TRACT | 3 refills | Status: DC

## 2021-07-12 DIAGNOSIS — J449 Chronic obstructive pulmonary disease, unspecified: Secondary | ICD-10-CM | POA: Diagnosis not present

## 2021-08-15 DIAGNOSIS — J449 Chronic obstructive pulmonary disease, unspecified: Secondary | ICD-10-CM | POA: Diagnosis not present

## 2021-08-16 ENCOUNTER — Other Ambulatory Visit: Payer: Self-pay | Admitting: Internal Medicine

## 2021-09-18 ENCOUNTER — Ambulatory Visit (INDEPENDENT_AMBULATORY_CARE_PROVIDER_SITE_OTHER): Payer: Medicare HMO | Admitting: Nurse Practitioner

## 2021-09-18 ENCOUNTER — Encounter: Payer: Self-pay | Admitting: Nurse Practitioner

## 2021-09-18 ENCOUNTER — Other Ambulatory Visit (HOSPITAL_COMMUNITY): Payer: Self-pay

## 2021-09-18 VITALS — BP 116/72 | HR 70 | Temp 97.2°F | Wt 156.0 lb

## 2021-09-18 DIAGNOSIS — E559 Vitamin D deficiency, unspecified: Secondary | ICD-10-CM | POA: Diagnosis not present

## 2021-09-18 DIAGNOSIS — I700 Atherosclerosis of aorta: Secondary | ICD-10-CM

## 2021-09-18 DIAGNOSIS — F17210 Nicotine dependence, cigarettes, uncomplicated: Secondary | ICD-10-CM

## 2021-09-18 DIAGNOSIS — J449 Chronic obstructive pulmonary disease, unspecified: Secondary | ICD-10-CM

## 2021-09-18 DIAGNOSIS — I10 Essential (primary) hypertension: Secondary | ICD-10-CM | POA: Diagnosis not present

## 2021-09-18 DIAGNOSIS — Z0000 Encounter for general adult medical examination without abnormal findings: Secondary | ICD-10-CM

## 2021-09-18 DIAGNOSIS — E0590 Thyrotoxicosis, unspecified without thyrotoxic crisis or storm: Secondary | ICD-10-CM | POA: Diagnosis not present

## 2021-09-18 DIAGNOSIS — R911 Solitary pulmonary nodule: Secondary | ICD-10-CM | POA: Diagnosis not present

## 2021-09-18 DIAGNOSIS — K449 Diaphragmatic hernia without obstruction or gangrene: Secondary | ICD-10-CM | POA: Diagnosis not present

## 2021-09-18 DIAGNOSIS — R1319 Other dysphagia: Secondary | ICD-10-CM

## 2021-09-18 DIAGNOSIS — R1310 Dysphagia, unspecified: Secondary | ICD-10-CM

## 2021-09-18 DIAGNOSIS — Z79899 Other long term (current) drug therapy: Secondary | ICD-10-CM | POA: Diagnosis not present

## 2021-09-18 DIAGNOSIS — E782 Mixed hyperlipidemia: Secondary | ICD-10-CM | POA: Diagnosis not present

## 2021-09-18 DIAGNOSIS — R7309 Other abnormal glucose: Secondary | ICD-10-CM | POA: Diagnosis not present

## 2021-09-18 DIAGNOSIS — K219 Gastro-esophageal reflux disease without esophagitis: Secondary | ICD-10-CM

## 2021-09-18 MED ORDER — FAMOTIDINE 40 MG PO TABS: 40.0000 mg | ORAL_TABLET | Freq: Two times a day (BID) | ORAL | 3 refills | Status: DC

## 2021-09-18 MED ORDER — BLOOD GLUCOSE MONITOR KIT: PACK | 0 refills | Status: DC

## 2021-09-18 MED ORDER — PANTOPRAZOLE SODIUM 40 MG PO TBEC: 40.0000 mg | DELAYED_RELEASE_TABLET | Freq: Two times a day (BID) | ORAL | 1 refills | Status: DC

## 2021-09-19 LAB — COMPLETE METABOLIC PANEL WITH GFR
AG Ratio: 2 (calc) (ref 1.0–2.5)
ALT: 38 U/L (ref 9–46)
AST: 26 U/L (ref 10–35)
Albumin: 4.6 g/dL (ref 3.6–5.1)
Alkaline phosphatase (APISO): 100 U/L (ref 35–144)
BUN: 16 mg/dL (ref 7–25)
CO2: 28 mmol/L (ref 20–32)
Calcium: 10.5 mg/dL — ABNORMAL HIGH (ref 8.6–10.3)
Chloride: 103 mmol/L (ref 98–110)
Creat: 1.16 mg/dL (ref 0.70–1.28)
Globulin: 2.3 g/dL (calc) (ref 1.9–3.7)
Glucose, Bld: 111 mg/dL — ABNORMAL HIGH (ref 65–99)
Potassium: 5.4 mmol/L — ABNORMAL HIGH (ref 3.5–5.3)
Sodium: 140 mmol/L (ref 135–146)
Total Bilirubin: 0.5 mg/dL (ref 0.2–1.2)
Total Protein: 6.9 g/dL (ref 6.1–8.1)
eGFR: 65 mL/min/{1.73_m2} (ref >=60)

## 2021-09-19 LAB — CBC WITH DIFFERENTIAL/PLATELET
Absolute Monocytes: 680 cells/uL (ref 200–950)
Basophils Absolute: 57 cells/uL (ref 0–200)
Basophils Relative: 0.7 %
Eosinophils Absolute: 32 cells/uL (ref 15–500)
Eosinophils Relative: 0.4 %
HCT: 48.5 % (ref 38.5–50.0)
Hemoglobin: 16.3 g/dL (ref 13.2–17.1)
Lymphs Abs: 1328 cells/uL (ref 850–3900)
MCH: 32.5 pg (ref 27.0–33.0)
MCHC: 33.6 g/dL (ref 32.0–36.0)
MCV: 96.8 fL (ref 80.0–100.0)
MPV: 11.5 fL (ref 7.5–12.5)
Monocytes Relative: 8.4 %
Neutro Abs: 6002 cells/uL (ref 1500–7800)
Neutrophils Relative %: 74.1 %
Platelets: 297 10*3/uL (ref 140–400)
RBC: 5.01 10*6/uL (ref 4.20–5.80)
RDW: 12.5 % (ref 11.0–15.0)
Total Lymphocyte: 16.4 %
WBC: 8.1 10*3/uL (ref 3.8–10.8)

## 2021-09-19 LAB — LIPID PANEL
Cholesterol: 159 mg/dL (ref <200)
HDL: 37 mg/dL — ABNORMAL LOW (ref >=40)
LDL Cholesterol (Calc): 88 mg/dL (calc)
Non-HDL Cholesterol (Calc): 122 mg/dL (calc) (ref <130)
Total CHOL/HDL Ratio: 4.3 (calc) (ref <5.0)
Triglycerides: 255 mg/dL — ABNORMAL HIGH (ref <150)

## 2021-09-19 LAB — HEMOGLOBIN A1C
Hgb A1c MFr Bld: 6.2 % of total Hgb — ABNORMAL HIGH (ref <5.7)
Mean Plasma Glucose: 131 mg/dL
eAG (mmol/L): 7.3 mmol/L

## 2021-09-19 LAB — TSH: TSH: 0.44 mIU/L (ref 0.40–4.50)

## 2021-09-19 LAB — VITAMIN D 25 HYDROXY (VIT D DEFICIENCY, FRACTURES): Vit D, 25-Hydroxy: 74 ng/mL (ref 30–100)

## 2021-09-20 ENCOUNTER — Other Ambulatory Visit: Payer: Self-pay | Admitting: Nurse Practitioner

## 2021-09-20 DIAGNOSIS — E875 Hyperkalemia: Secondary | ICD-10-CM

## 2021-09-25 ENCOUNTER — Other Ambulatory Visit: Payer: Self-pay | Admitting: Internal Medicine

## 2021-09-25 DIAGNOSIS — R1319 Other dysphagia: Secondary | ICD-10-CM

## 2021-09-28 ENCOUNTER — Other Ambulatory Visit: Payer: Self-pay | Admitting: Nurse Practitioner

## 2021-09-28 ENCOUNTER — Ambulatory Visit (HOSPITAL_COMMUNITY)
Admission: RE | Admit: 2021-09-28 | Discharge: 2021-09-28 | Disposition: A | Discharge status: Home / Self Care | Payer: Medicare HMO | Source: Ambulatory Visit | Attending: Nurse Practitioner | Admitting: Nurse Practitioner

## 2021-09-28 ENCOUNTER — Other Ambulatory Visit: Payer: Self-pay | Admitting: Internal Medicine

## 2021-09-28 DIAGNOSIS — I10 Essential (primary) hypertension: Secondary | ICD-10-CM | POA: Insufficient documentation

## 2021-09-28 DIAGNOSIS — R1319 Other dysphagia: Secondary | ICD-10-CM

## 2021-09-28 DIAGNOSIS — J449 Chronic obstructive pulmonary disease, unspecified: Secondary | ICD-10-CM | POA: Insufficient documentation

## 2021-09-28 DIAGNOSIS — R059 Cough, unspecified: Secondary | ICD-10-CM | POA: Diagnosis not present

## 2021-09-28 DIAGNOSIS — K219 Gastro-esophageal reflux disease without esophagitis: Secondary | ICD-10-CM | POA: Diagnosis not present

## 2021-09-28 DIAGNOSIS — R1310 Dysphagia, unspecified: Secondary | ICD-10-CM | POA: Diagnosis not present

## 2021-09-28 DIAGNOSIS — K224 Dyskinesia of esophagus: Secondary | ICD-10-CM | POA: Diagnosis not present

## 2021-09-28 DIAGNOSIS — K449 Diaphragmatic hernia without obstruction or gangrene: Secondary | ICD-10-CM

## 2021-09-28 DIAGNOSIS — E782 Mixed hyperlipidemia: Secondary | ICD-10-CM | POA: Insufficient documentation

## 2021-09-28 DIAGNOSIS — R6339 Other feeding difficulties: Secondary | ICD-10-CM | POA: Diagnosis not present

## 2021-09-28 DIAGNOSIS — R1110 Vomiting, unspecified: Secondary | ICD-10-CM

## 2021-10-02 ENCOUNTER — Other Ambulatory Visit: Payer: Self-pay | Admitting: Internal Medicine

## 2021-10-02 MED ORDER — METOCLOPRAMIDE HCL 10 MG PO TABS: ORAL_TABLET | ORAL | 1 refills | Status: DC

## 2021-10-10 ENCOUNTER — Other Ambulatory Visit: Payer: Self-pay | Admitting: Nurse Practitioner

## 2021-10-10 DIAGNOSIS — K219 Gastro-esophageal reflux disease without esophagitis: Secondary | ICD-10-CM

## 2021-12-12 DIAGNOSIS — J449 Chronic obstructive pulmonary disease, unspecified: Secondary | ICD-10-CM | POA: Diagnosis not present

## 2022-01-07 ENCOUNTER — Encounter: Payer: Self-pay | Admitting: Internal Medicine

## 2022-01-07 NOTE — Patient Instructions (Signed)

## 2022-01-07 NOTE — Progress Notes (Unsigned)
Annual  Screening/Preventative Visit  & Comprehensive Evaluation & Examination  Future Appointments  Date Time Provider Department  01/08/2022                             cpe 11:00 AM Unk Pinto, MD [facility]  08/09/2022                              wellness 11:00 AM Darrol Jump, NP [facility]  01/11/2023                             cpe 11:00 AM Unk Pinto, MD [facility]        This very nice 76 y.o.  MWM presents for a Screening /Preventative Visit & comprehensive evaluation and management of multiple medical co-morbidities.  Patient has been followed for HTN, HLD, T2_NIDDM, Occult Hyperthyroidism, COPD and Vitamin D Deficiency. Patient, a 33+ yr smoker,  is also followed by Pulmonary for a LUL nodule discovered with screening LD Chest CT  In 2020. Patient has prior hx/o GERD.      HTN predates circa 2005. Patient's BP has been controlled at home.  Today's BP is at goal at [place] . Patient denies any cardiac symptoms as chest pain, palpitations, shortness of breath, dizziness or ankle swelling.     Patient's hyperlipidemia is near controlled with diet and Rosuvastatin. Patient denies myalgias or other medication SE's. Last lipids were near goal with elevated Trig's:  Lab Results  Component Value Date   CHOL 159 09/18/2021   HDL 37 (L) 09/18/2021   LDLCALC 88 09/18/2021   TRIG 255 (H) 09/18/2021   CHOLHDL 4.3 09/18/2021       Patient has hx/o T2_NIDDM  (A1c 7.5% / 2010)  and was treated with MF til Aug 2018 and has since controlled with diet.  Patient has  Diabetic CKD2 (GFR 77).   Patient denies reactive hypoglycemic symptoms, visual blurring, diabetic polys or paresthesias. Last A1c was near goal:  Lab Results  Component Value Date   HGBA1C 6.2 (H) 09/18/2021        Patient is followed for Occult or sub-clinical Hyperthyroidism (2019-2020).     Finally, patient has history of Vitamin D Deficiency ("23" / 2008)  and last vitamin D was at goal:  Lab Results   Component Value Date   VD25OH 74 09/18/2021    Current Outpatient Medications on File Prior to Visit  Medication Sig   albuterol HFA inhaler 2 inhalations 15 minutes apart every 4 hours to Rescue Asthma   aspirin 81 MG tablet Take 81 mg by mouth daily.   cetirizine 10 MG tablet Take one tablet nightly for allergies.   VITAMIN D 2000 UNITS CAPS Take 2 capsules by mouth daily.    diltiazem XR 240 MG  TAKE 1 CAPSULE DAILY    Famotidine 40 MG tablet Take 1 tablet 2  times daily.   TRELEGY ELLIPTA 200-62.5-25  Use  1 Inhalation  Daily  for COPD /Asthma   DUONEB 0.5-2.5 (3) MG/3ML SOLN Inhale 1 vial (3 mL) every 4 to 6 hours PRN.   losartan 100 MG tablet TAKE 1 TABLET EVERY DAY    magnesium  500 MG tablet Take   daily.    metoCLOPramide 10 MG tablet Take  1 tablet  3 x/ day  with Lunch, Supper &  Bedtime    pantoprazole 40 MG tablet Take  1 tablet  2 x /day  to Prevent Indigestion & Heartburn   rosuvastatin 40 MG tablet TAKE 1 TABLET DAILY FOR CHOLESTEROL   zinc 50 MG tablet Take   daily.    Allergies  Allergen Reactions   Ppd [Tuberculin Purified Protein Derivative]     Positive PPD.   Past Medical History:  Diagnosis Date   Diabetes mellitus without complication ([place])    Hyperlipidemia    Hypertension    Vitamin D deficiency    Health Maintenance  Topic Date Due   Hepatitis C Screening  Never done   Zoster Vaccines- Shingrix (1 of 2) Never done   COVID-19 Vaccine (3 - Pfizer risk series) 07/06/2019   INFLUENZA VACCINE  10/24/2021   TETANUS/TDAP  08/07/2026   Pneumonia Vaccine 40+ Years old  Completed   HPV VACCINES  Aged Out   Fecal DNA (Cologuard)  Discontinued   Immunization History  Administered Date(s) Administered   DTaP 05/23/2006   Influenza Whole 12/16/2012   Influenza, High Dose Seasonal PF 01/17/2015, 12/13/2015, 01/29/2017, 12/17/2017, 01/14/2019, 01/27/2020, 01/24/2021   Influenza 12/27/2013   PFIZER-SARS-COV-2 Vaccination 05/18/2019, 06/08/2019    Pneumococcal -13 04/13/2014   Pneumococcal -23 09/13/2008, 08/06/2016   Td 08/06/2016   Zoster, Live 04/17/2013   Last Colon - 08/15/2007 - Dr Deatra Ina - recc 88 yr f/u    - Dr Loletha Carrow sent a recall letter 07/17/2017  - Patient agreeable to [place] 7/1-/2022  - Negative - recc 3 year f/u due July 2025  Past Surgical History:  Procedure Laterality Date   CATARACT EXTRACTION, BILATERAL Bilateral 2018   Dr. Coral Ceo   Family History  Problem Relation Age of Onset   Heart attack Mother    Heart disease Mother    Heart attack Brother    Hypertension Brother    Diabetes Sister    Heart disease Sister    Heart disease Father    Heart attack Father    Social History   Socioeconomic History   Marital status: Married    Spouse name: Not on file   Number of children: 2  Occupational History   Occupation: Retired Futures trader  Tobacco Use   Smoking status: Former Smoker    Packs/day: 1.50    Years: 44.00    Pack years: 66.00    Types: Cigarettes    Start date: 1987    Quit date: 06/14/2019    Years since quitting: 0.4   Smokeless tobacco: Never Used  Substance and Sexual Activity   Alcohol use: No    Alcohol/week: 0.0 standard drinks    Comment: He states he no longer drinks alcohol   Drug use: No   Sexual activity: Not on file     ROS Constitutional: Denies fever, chills, weight loss/gain, headaches, insomnia,  night sweats or change in appetite. Does c/o fatigue. Eyes: Denies redness, blurred vision, diplopia, discharge, itchy or watery eyes.  ENT: Denies discharge, congestion, post nasal drip, epistaxis, sore throat, earache, hearing loss, dental pain, Tinnitus, Vertigo, Sinus pain or snoring.  Cardio: Denies chest pain, palpitations, irregular heartbeat, syncope, dyspnea, diaphoresis, orthopnea, PND, claudication or edema Respiratory: denies cough, dyspnea, DOE, pleurisy, hoarseness, laryngitis or wheezing.  Gastrointestinal: Denies  dysphagia, heartburn, reflux, water brash, pain, cramps, nausea, vomiting, bloating, diarrhea, constipation, hematemesis, melena, hematochezia, jaundice or hemorrhoids Genitourinary: Denies dysuria, frequency,  discharge, hematuria or flank pain. Has urgency, nocturia x 2-3 & occasional hesitancy.  Musculoskeletal: Denies arthralgia, myalgia, stiffness, Jt. Swelling, pain, limp or strain/sprain. Denies Falls. Skin: Denies puritis, rash, hives, warts, acne, eczema or change in skin lesion Neuro: No weakness, tremor, incoordination, spasms, paresthesia or pain Psychiatric: Denies confusion, memory loss or sensory loss. Denies Depression. Endocrine: Denies change in weight, skin, hair change, nocturia, and paresthesia, diabetic polys, visual blurring or hyper / hypo glycemic episodes.  Heme/Lymph: No excessive bleeding, bruising or enlarged lymph nodes.  Physical Exam  BP 118/68   Pulse 70   Temp 97.9 F (36.6 C)   Resp 16   Ht 5' 9.5" (1.765 m)   Wt 160 lb 9.6 oz (72.8 kg)   SpO2 98%   BMI 23.38 kg/m   General Appearance: Well nourished and well groomed and in no apparent distress.  Eyes: PERRLA, EOMs, conjunctiva no swelling or erythema, normal fundi and vessels. Sinuses: No frontal/maxillary tenderness ENT/Mouth: EACs patent / TMs  nl. Nares clear without erythema, swelling, mucoid exudates. Oral hygiene is good. No erythema, swelling, or exudate. Tongue normal, non-obstructing. Tonsils not swollen or erythematous. Hearing normal.  Neck: Supple, thyroid not palpable. No bruits, nodes or JVD. Respiratory: Respiratory effort normal.  BS equal and clear bilateral without rales, rhonci, wheezing or stridor. Cardio: Heart sounds are normal with regular rate and rhythm and no murmurs, rubs or gallops. Peripheral pulses are normal and equal bilaterally without edema. No aortic or femoral bruits. Chest: symmetric with normal excursions and percussion.  Abdomen: Soft, with Nl bowel sounds.  Nontender, no guarding, rebound, hernias, masses, or organomegaly.  Lymphatics: Non tender without lymphadenopathy.  Musculoskeletal: Full ROM all peripheral extremities, joint stability, 5/5 strength, and normal gait. Skin: Warm and dry without rashes, lesions, cyanosis, clubbing or  ecchymosis.  Neuro: Cranial nerves intact, reflexes equal bilaterally. Normal muscle tone, no cerebellar symptoms. Sensation intact.  Pysch: Alert and oriented X 3 with normal affect, insight and judgment appropriate.   Assessment and Plan  1. Annual Preventative/Screening Exam    2. Essential hypertension  - EKG 12-Lead - [place], RETROPERITNL ABD,  LTD - Urinalysis, Routine w reflex microscopic - Microalbumin / creatinine urine ratio - CBC with Differential/Platelet - COMPLETE METABOLIC PANEL WITH GFR - Magnesium - TSH  3. Hyperlipidemia associated with type 2 diabetes mellitus ([place])  - EKG 12-Lead - [place], RETROPERITNL ABD,  LTD - Lipid panel - TSH  4. Type 2 diabetes mellitus with stage 2 chronic kidney  disease, without long-term current use of insulin (HCC)  - EKG 12-Lead - [place], RETROPERITNL ABD,  LTD - HM DIABETES FOOT EXAM - Hemoglobin A1c - Insulin, random - PR LOW EXTEMITY NEUR EXAM DOCUM  5. Vitamin D deficiency  - VITAMIN D 25 Hydroxy   6. Gastroesophageal reflux disease  - CBC with Differential/Platelet  7. Chronic obstructive pulmonary disease ([place])   8. Hyperthyroidism  - TSH  9. Benign prostatic hyperplasia with lower urinary tract symptoms,  - PSA  10. Prostate cancer screening  - PSA  11. Screening for colorectal cancer  - POC Hemoccult Bld/Stl   12. Aortic atherosclerosis (HCC)  - EKG 12-Lead - [place], RETROPERITNL ABD,  LTD  13. Screening for heart disease  - EKG 12-Lead  14. FHx: heart disease  - EKG 12-Lead - [place], RETROPERITNL ABD,  LTD  15. Former smoker (44 pack year hx, quit 2021)  - EKG 12-Lead - [place], RETROPERITNL ABD,  LTD  16. Screening  for abdominal aortic aneurysm  - [place], RETROPERITNL ABD,  LTD  17. Medication  management  - Urinalysis, Routine w reflex microscopic - Microalbumin / creatinine urine ratio - CBC with Differential/Platelet - COMPLETE METABOLIC PANEL WITH GFR - Magnesium - Lipid panel - TSH - Hemoglobin A1c - Insulin, random - VITAMIN D 25 Hydroxy   18. Need for immunization against influenza  - Flu vaccine HIGH DOSE PF (Fluzone High dose)  19. Abnormal glucose   20. Atherosclerosis of native coronary artery  of native heart without angina pectoris           Patient was counseled in prudent diet, weight control to achieve/maintain BMI less than 25, BP monitoring, regular exercise and medications as discussed.  Discussed med effects and SE's. Routine screening labs and tests as requested with regular follow-up as recommended. Over 40 minutes of exam, counseling, chart review and high complex critical decision making was performed   Kirtland Bouchard, MD

## 2022-01-08 ENCOUNTER — Encounter: Payer: Self-pay | Admitting: Internal Medicine

## 2022-01-08 ENCOUNTER — Ambulatory Visit (INDEPENDENT_AMBULATORY_CARE_PROVIDER_SITE_OTHER): Payer: Medicare HMO | Admitting: Internal Medicine

## 2022-01-08 VITALS — BP 118/68 | HR 70 | Temp 97.9°F | Resp 16 | Ht 69.5 in | Wt 160.6 lb

## 2022-01-08 DIAGNOSIS — Z136 Encounter for screening for cardiovascular disorders: Secondary | ICD-10-CM

## 2022-01-08 DIAGNOSIS — Z8249 Family history of ischemic heart disease and other diseases of the circulatory system: Secondary | ICD-10-CM | POA: Diagnosis not present

## 2022-01-08 DIAGNOSIS — Z87891 Personal history of nicotine dependence: Secondary | ICD-10-CM | POA: Diagnosis not present

## 2022-01-08 DIAGNOSIS — E0590 Thyrotoxicosis, unspecified without thyrotoxic crisis or storm: Secondary | ICD-10-CM

## 2022-01-08 DIAGNOSIS — E1169 Type 2 diabetes mellitus with other specified complication: Secondary | ICD-10-CM | POA: Diagnosis not present

## 2022-01-08 DIAGNOSIS — E559 Vitamin D deficiency, unspecified: Secondary | ICD-10-CM

## 2022-01-08 DIAGNOSIS — Z0000 Encounter for general adult medical examination without abnormal findings: Secondary | ICD-10-CM

## 2022-01-08 DIAGNOSIS — R7309 Other abnormal glucose: Secondary | ICD-10-CM

## 2022-01-08 DIAGNOSIS — I700 Atherosclerosis of aorta: Secondary | ICD-10-CM

## 2022-01-08 DIAGNOSIS — I10 Essential (primary) hypertension: Secondary | ICD-10-CM

## 2022-01-08 DIAGNOSIS — J449 Chronic obstructive pulmonary disease, unspecified: Secondary | ICD-10-CM

## 2022-01-08 DIAGNOSIS — E1122 Type 2 diabetes mellitus with diabetic chronic kidney disease: Secondary | ICD-10-CM | POA: Diagnosis not present

## 2022-01-08 DIAGNOSIS — I2510 Atherosclerotic heart disease of native coronary artery without angina pectoris: Secondary | ICD-10-CM | POA: Diagnosis not present

## 2022-01-08 DIAGNOSIS — K219 Gastro-esophageal reflux disease without esophagitis: Secondary | ICD-10-CM

## 2022-01-08 DIAGNOSIS — N182 Chronic kidney disease, stage 2 (mild): Secondary | ICD-10-CM | POA: Diagnosis not present

## 2022-01-08 DIAGNOSIS — Z23 Encounter for immunization: Secondary | ICD-10-CM

## 2022-01-08 DIAGNOSIS — Z1211 Encounter for screening for malignant neoplasm of colon: Secondary | ICD-10-CM

## 2022-01-08 DIAGNOSIS — Z125 Encounter for screening for malignant neoplasm of prostate: Secondary | ICD-10-CM | POA: Diagnosis not present

## 2022-01-08 DIAGNOSIS — E785 Hyperlipidemia, unspecified: Secondary | ICD-10-CM | POA: Diagnosis not present

## 2022-01-08 DIAGNOSIS — N401 Enlarged prostate with lower urinary tract symptoms: Secondary | ICD-10-CM

## 2022-01-08 DIAGNOSIS — Z79899 Other long term (current) drug therapy: Secondary | ICD-10-CM

## 2022-01-08 DIAGNOSIS — Z0001 Encounter for general adult medical examination with abnormal findings: Secondary | ICD-10-CM

## 2022-01-09 LAB — HEMOGLOBIN A1C
Hgb A1c MFr Bld: 6.5 % of total Hgb — ABNORMAL HIGH (ref <5.7)
Mean Plasma Glucose: 140 mg/dL
eAG (mmol/L): 7.7 mmol/L

## 2022-01-09 LAB — MAGNESIUM: Magnesium: 2.1 mg/dL (ref 1.5–2.5)

## 2022-01-09 LAB — COMPLETE METABOLIC PANEL WITH GFR
AG Ratio: 1.6 (calc) (ref 1.0–2.5)
ALT: 30 U/L (ref 9–46)
AST: 23 U/L (ref 10–35)
Albumin: 4.2 g/dL (ref 3.6–5.1)
Alkaline phosphatase (APISO): 95 U/L (ref 35–144)
BUN: 10 mg/dL (ref 7–25)
CO2: 25 mmol/L (ref 20–32)
Calcium: 9.9 mg/dL (ref 8.6–10.3)
Chloride: 105 mmol/L (ref 98–110)
Creat: 0.94 mg/dL (ref 0.70–1.28)
Globulin: 2.7 g/dL (calc) (ref 1.9–3.7)
Glucose, Bld: 111 mg/dL — ABNORMAL HIGH (ref 65–99)
Potassium: 4.4 mmol/L (ref 3.5–5.3)
Sodium: 142 mmol/L (ref 135–146)
Total Bilirubin: 0.3 mg/dL (ref 0.2–1.2)
Total Protein: 6.9 g/dL (ref 6.1–8.1)
eGFR: 84 mL/min/{1.73_m2} (ref >=60)

## 2022-01-09 LAB — CBC WITH DIFFERENTIAL/PLATELET
Absolute Monocytes: 504 cells/uL (ref 200–950)
Basophils Absolute: 92 cells/uL (ref 0–200)
Basophils Relative: 1.3 %
Eosinophils Absolute: 71 cells/uL (ref 15–500)
Eosinophils Relative: 1 %
HCT: 44 % (ref 38.5–50.0)
Hemoglobin: 14.7 g/dL (ref 13.2–17.1)
Lymphs Abs: 1448 cells/uL (ref 850–3900)
MCH: 32.1 pg (ref 27.0–33.0)
MCHC: 33.4 g/dL (ref 32.0–36.0)
MCV: 96.1 fL (ref 80.0–100.0)
MPV: 11.8 fL (ref 7.5–12.5)
Monocytes Relative: 7.1 %
Neutro Abs: 4984 cells/uL (ref 1500–7800)
Neutrophils Relative %: 70.2 %
Platelets: 270 10*3/uL (ref 140–400)
RBC: 4.58 10*6/uL (ref 4.20–5.80)
RDW: 12.7 % (ref 11.0–15.0)
Total Lymphocyte: 20.4 %
WBC: 7.1 10*3/uL (ref 3.8–10.8)

## 2022-01-09 LAB — URINALYSIS, ROUTINE W REFLEX MICROSCOPIC
Bacteria, UA: NONE SEEN /HPF
Bilirubin Urine: NEGATIVE
Glucose, UA: NEGATIVE
Hgb urine dipstick: NEGATIVE
Hyaline Cast: NONE SEEN /LPF
Ketones, ur: NEGATIVE
Nitrite: NEGATIVE
Protein, ur: NEGATIVE
RBC / HPF: NONE SEEN /HPF (ref 0–2)
Specific Gravity, Urine: 1.01 (ref 1.001–1.035)
Squamous Epithelial / HPF: NONE SEEN /HPF (ref <=5)
WBC, UA: NONE SEEN /HPF (ref 0–5)
pH: 7 (ref 5.0–8.0)

## 2022-01-09 LAB — LIPID PANEL
Cholesterol: 164 mg/dL (ref <200)
HDL: 35 mg/dL — ABNORMAL LOW (ref >=40)
LDL Cholesterol (Calc): 86 mg/dL (calc)
Non-HDL Cholesterol (Calc): 129 mg/dL (calc) (ref <130)
Total CHOL/HDL Ratio: 4.7 (calc) (ref <5.0)
Triglycerides: 357 mg/dL — ABNORMAL HIGH (ref <150)

## 2022-01-09 LAB — TSH: TSH: 0.56 mIU/L (ref 0.40–4.50)

## 2022-01-09 LAB — MICROALBUMIN / CREATININE URINE RATIO
Creatinine, Urine: 58 mg/dL (ref 20–320)
Microalb, Ur: <0.2 mg/dL

## 2022-01-09 LAB — VITAMIN D 25 HYDROXY (VIT D DEFICIENCY, FRACTURES): Vit D, 25-Hydroxy: 59 ng/mL (ref 30–100)

## 2022-01-09 LAB — INSULIN, RANDOM: Insulin: 23 u[IU]/mL — ABNORMAL HIGH

## 2022-01-09 LAB — PSA: PSA: 1.5 ng/mL (ref <=4.00)

## 2022-01-09 NOTE — Progress Notes (Signed)
<><><><><><><><><><><><><><><><><><><><><><><><><><><><><><><><><> <><><><><><><><><><><><><><><><><><><><><><><><><><><><><><><><><> -   Test results slightly outside the reference range are not unusual. If there is anything important, I will review this with you,  otherwise it is considered normal test values.  If you have further questions,  please do not hesitate to contact me at the office or via My Chart.  <><><><><><><><><><><><><><><><><><><><><><><><><><><><><><><><><> <><><><><><><><><><><><><><><><><><><><><><><><><><><><><><><><><>  -  Total Chol = 164   & LDL Chol= 86   - Both  Wonderful    - Very low risk for Heart Attack  / Stroke <><><><><><><><><><><><><><><><><><><><><><><><><><><><><><><><><> <><><><><><><><><><><><><><><><><><><><><><><><><><><><><><><><><>  -  But   Triglycerides (   357   ) or fats in blood are STILL  too high                 (   Ideal or  Goal is less than 150  !  )    - Recommend avoid fried & greasy foods,  sweets / candy,   - Avoid white rice  (brown or wild rice or Quinoa is OK),   - Avoid white potatoes  (sweet potatoes are OK)   - Avoid anything made from white flour  - bagels, doughnuts, rolls, buns, biscuits, white and   wheat breads, pizza crust and traditional  pasta made of white flour & egg white  - (vegetarian pasta or spinach or wheat pasta is OK).    - Multi-grain bread is OK - like multi-grain flat bread or  sandwich thins.   - Avoid alcohol in excess.   - Exercise is also important. <><><><><><><><><><><><><><><><><><><><><><><><><><><><><><><><><> <><><><><><><><><><><><><><><><><><><><><><><><><><><><><><><><><>  -  A1c = 6.5%  and is now  "officially" in the Diabetic Range,   So it's important to    - Avoid Sweets, Candy & White Stuff   - White Rice, White [place], White Flour  - Breads &   Pasta <><><><><><><><><><><><><><><><><><><><><><><><><><><><><><><><><> <><><><><><><><><><><><><><><><><><><><><><><><><><><><><><><><><>  -  PSA - Low - Great   -> No Prostate Cancer  ! <><><><><><><><><><><><><><><><><><><><><><><><><><><><><><><><><> <><><><><><><><><><><><><><><><><><><><><><><><><><><><><><><><><>  -  Vitamin D = 59  - Great   - Please keep dose same   <><><><><><><><><><><><><><><><><><><><><><><><><><><><><><><><><> <><><><><><><><><><><><><><><><><><><><><><><><><><><><><><><><><>  -  All Else - CBC - Kidneys - Electrolytes - Liver - Magnesium & Thyroid    - all  Normal / OK   <><><><><><><><><><><><><><><><><><><><><><><><><><><><><><><><><> <><><><><><><><><><><><><><><><><><><><><><><><><><><><><><><><><>

## 2022-01-12 ENCOUNTER — Encounter: Payer: Medicare HMO | Admitting: Internal Medicine

## 2022-02-08 ENCOUNTER — Ambulatory Visit (INDEPENDENT_AMBULATORY_CARE_PROVIDER_SITE_OTHER): Payer: Medicare HMO | Admitting: Nurse Practitioner

## 2022-02-08 ENCOUNTER — Other Ambulatory Visit: Payer: Self-pay

## 2022-02-08 ENCOUNTER — Encounter: Payer: Self-pay | Admitting: Nurse Practitioner

## 2022-02-08 VITALS — BP 118/64 | HR 87 | Temp 97.9°F | Ht 69.5 in | Wt 162.0 lb

## 2022-02-08 DIAGNOSIS — R1110 Vomiting, unspecified: Secondary | ICD-10-CM | POA: Diagnosis not present

## 2022-02-08 DIAGNOSIS — J00 Acute nasopharyngitis [common cold]: Secondary | ICD-10-CM | POA: Diagnosis not present

## 2022-02-08 DIAGNOSIS — Z1152 Encounter for screening for COVID-19: Secondary | ICD-10-CM

## 2022-02-08 DIAGNOSIS — R6889 Other general symptoms and signs: Secondary | ICD-10-CM

## 2022-02-08 DIAGNOSIS — R509 Fever, unspecified: Secondary | ICD-10-CM

## 2022-02-08 DIAGNOSIS — R5383 Other fatigue: Secondary | ICD-10-CM

## 2022-02-08 LAB — POC COVID19 BINAXNOW: SARS Coronavirus 2 Ag: NEGATIVE

## 2022-02-08 LAB — POCT INFLUENZA A/B
Influenza A, POC: NEGATIVE
Influenza B, POC: NEGATIVE

## 2022-02-08 MED ORDER — AZITHROMYCIN 500 MG PO TABS: ORAL_TABLET | ORAL | 0 refills | Status: DC

## 2022-02-08 NOTE — Progress Notes (Signed)
Assessment and Plan:  [name] was seen today for an episodic visit.  Diagnoses and all order for this visit:  1. Encounter for screening for COVID-19 Negative  - POC COVID-19  2. Flu-like symptoms Negative  - POCT Influenza A/B  3. Fever, unspecified fever cause Continue Tylenol Stay well hydrated  4. Vomiting, unspecified vomiting type, unspecified whether nausea present Subsided Stay well hydrated Reach out to office for any increase in nausea--will treat with Ondansetron if unable to hold  down food/drink  5. Other fatigue Rest  6. Acute nasopharyngitis Continue to monitor for worsening symptoms Contact office if s/s fail to improve.  Meds ordered this encounter  Medications   azithromycin (ZITHROMAX) 500 MG tablet    Sig: Take 1 tablet (500 mg) daily for 10 days.    Dispense:  10 tablet    Refill:  0    Order Specific Question:   Supervising Provider    Answer:   Jesus Bush [phone]   Notify office for further evaluation and treatment, questions or concerns if s/s fail to improve. The risks and benefits of my recommendations, as well as other treatment options were discussed with the patient today. Questions were answered.  Further disposition pending results of labs. Discussed med's effects and SE's.    Over 15 minutes of exam, counseling, chart review, and critical decision making was performed.   Future Appointments  Date Time Provider [place]  04/10/2022 11:30 AM Darrol Jump, NP [facility] None  01/11/2023 11:00 AM Jesus Pinto, MD [facility] None    ------------------------------------------------------------------------------------------------------------------   HPI BP 118/64   Pulse 87   Temp 97.9 F (36.6 C)   Ht 5' 9.5" (1.765 m)   Wt 162 lb (73.5 kg)   SpO2 90%   BMI 23.58 kg/m   76 y.o.male presents for new onset of fever, chills, N/V, nasal congestion, cough, HA, fatigue x2 days.  He reports feeling  better today but continues to have have linger cough with nasal congestion and HA.  Denies difficulty breathing, chest pain.  Has been taking Tylenol with some relief.   Past Medical History:  Diagnosis Date   Diabetes mellitus without complication ([place])    Hyperlipidemia    Hypertension    Vitamin D deficiency      Allergies  Allergen Reactions   Ppd [Tuberculin Purified Protein Derivative]     Positive PPD.    Current Outpatient Medications on File Prior to Visit  Medication Sig   ACCU-CHEK SOFTCLIX LANCETS lancets Check blood sugar 1 time daily-DX-R73.09   albuterol (VENTOLIN HFA) 108 (90 Base) MCG/ACT inhaler 2 inhalations 15 minutes apart every 4 hours to Rescue Asthma   Alcohol Swabs (B-D SINGLE USE SWABS REGULAR) PADS Check blood sugar 1 time daily-DX-R73.09.   aspirin 81 MG tablet Take 81 mg by mouth daily.   Blood Glucose Calibration (ACCU-CHEK AVIVA) SOLN Use per box instruction.   blood glucose meter kit and supplies KIT Test blood sugar daily or as directed   Blood Glucose Monitoring Suppl (ACCU-CHEK AVIVA PLUS) w/Device KIT Check blood sugar 1 time daily-DX-R73.09   cetirizine (ZYRTEC ALLERGY) 10 MG tablet Take one tablet nightly for allergies.   Cholecalciferol (VITAMIN D) 2000 UNITS CAPS Take 2 capsules by mouth daily.    diltiazem (DILACOR XR) 240 MG 24 hr capsule TAKE 1 CAPSULE DAILY FOR BLOOD PRESSURE AND HEART RHYTHM   famotidine (PEPCID) 40 MG tablet Take 1 tablet (40 mg total) by mouth 2 (two) times daily.  Fluticasone-Umeclidin-Vilant (TRELEGY ELLIPTA) 200-62.5-25 MCG/ACT AEPB Use  1 Inhalation  Daily  for COPD /Asthma   glucose blood (ACCU-CHEK AVIVA PLUS) test strip Check blood sugar 1 time daily-R73.09.   ipratropium-albuterol (DUONEB) 0.5-2.5 (3) MG/3ML SOLN Inhale 1 vial (3 mL) every 4 to 6 hours PRN.   losartan (COZAAR) 100 MG tablet TAKE 1 TABLET EVERY DAY FOR BLOOD PRESSURE   magnesium gluconate (MAGONATE) 500 MG tablet Take 500 mg by mouth daily.     metoCLOPramide (REGLAN) 10 MG tablet Take  1 tablet  3 x/ day  with Lunch, Supper & Bedtime for Acid Indigestion & Reflux.   pantoprazole (PROTONIX) 40 MG tablet Take  1 tablet  2 x /day  to Prevent Indigestion & Heartburn   rosuvastatin (CRESTOR) 40 MG tablet TAKE 1 TABLET DAILY FOR CHOLESTEROL   zinc gluconate 50 MG tablet Take 50 mg by mouth daily.   No current facility-administered medications on file prior to visit.    ROS: all negative except what is noted in the HPI.   Physical Exam:  BP 118/64   Pulse 87   Temp 97.9 F (36.6 C)   Ht 5' 9.5" (1.765 m)   Wt 162 lb (73.5 kg)   SpO2 90%   BMI 23.58 kg/m   General Appearance: NAD.  Awake, conversant and cooperative. Eyes: PERRLA, EOMs intact.  Sclera white.  Conjunctiva without erythema. Sinuses: Frontal/maxillary tenderness.  No nasal discharge. Nares patent.  ENT/Mouth: Ext aud canals clear.  Bilateral TMs w/DOL and without erythema or bulging. Hearing intact.  Posterior pharynx without swelling or exudate.  Tonsils without swelling or erythema.  Neck: Supple.  No masses, nodules or thyromegaly. Respiratory: Effort is regular with non-labored breathing. Breath sounds are equal bilaterally without rales, rhonchi, wheezing or stridor.  Cardio: RRR with no MRGs. Brisk peripheral pulses without edema.  Abdomen: Active BS in all four quadrants.  Soft and non-tender without guarding, rebound tenderness, hernias or masses. Lymphatics: Non tender without lymphadenopathy.  Musculoskeletal: Full ROM, 5/5 strength, normal ambulation.  No clubbing or cyanosis. Skin: Appropriate color for ethnicity. Warm without rashes, lesions, ecchymosis, ulcers.  Neuro: CN II-XII grossly normal. Normal muscle tone without cerebellar symptoms and intact sensation.   Psych: AO X 3,  appropriate mood and affect, insight and judgment.     Darrol Jump, NP 10:24 AM [facility]

## 2022-02-09 ENCOUNTER — Encounter: Payer: Self-pay | Admitting: Nurse Practitioner

## 2022-02-09 DIAGNOSIS — J449 Chronic obstructive pulmonary disease, unspecified: Secondary | ICD-10-CM | POA: Diagnosis not present

## 2022-02-09 MED ORDER — PROMETHAZINE-DM 6.25-15 MG/5ML PO SYRP: 5.0000 mL | ORAL_SOLUTION | Freq: Four times a day (QID) | ORAL | 0 refills | Status: DC | PRN

## 2022-02-09 MED ORDER — BENZONATATE 100 MG PO CAPS: 100.0000 mg | ORAL_CAPSULE | Freq: Three times a day (TID) | ORAL | 1 refills | Status: DC | PRN

## 2022-03-01 ENCOUNTER — Telehealth: Payer: Self-pay | Admitting: Nurse Practitioner

## 2022-03-01 ENCOUNTER — Other Ambulatory Visit: Payer: Self-pay | Admitting: Nurse Practitioner

## 2022-03-01 MED ORDER — METOCLOPRAMIDE HCL 10 MG PO TABS: ORAL_TABLET | ORAL | 1 refills | Status: DC

## 2022-03-01 NOTE — Telephone Encounter (Signed)
Pt is requesting a refill on metoCLOPramide (REGLAN) 10 MG tablet but he is wanting a 90 day supply to go to [facility]

## 2022-03-02 DIAGNOSIS — J449 Chronic obstructive pulmonary disease, unspecified: Secondary | ICD-10-CM | POA: Diagnosis not present

## 2022-03-10 ENCOUNTER — Other Ambulatory Visit: Payer: Self-pay | Admitting: Internal Medicine

## 2022-04-09 ENCOUNTER — Ambulatory Visit: Payer: Medicare HMO | Admitting: Nurse Practitioner

## 2022-04-10 ENCOUNTER — Ambulatory Visit (INDEPENDENT_AMBULATORY_CARE_PROVIDER_SITE_OTHER): Payer: Medicare HMO | Admitting: Nurse Practitioner

## 2022-04-10 ENCOUNTER — Encounter: Payer: Self-pay | Admitting: Nurse Practitioner

## 2022-04-10 ENCOUNTER — Ambulatory Visit: Payer: Medicare HMO | Admitting: Nurse Practitioner

## 2022-04-10 VITALS — BP 118/76 | HR 67 | Temp 97.3°F | Ht 69.5 in | Wt 162.0 lb

## 2022-04-10 DIAGNOSIS — F17200 Nicotine dependence, unspecified, uncomplicated: Secondary | ICD-10-CM | POA: Diagnosis not present

## 2022-04-10 DIAGNOSIS — R6889 Other general symptoms and signs: Secondary | ICD-10-CM | POA: Diagnosis not present

## 2022-04-10 DIAGNOSIS — J449 Chronic obstructive pulmonary disease, unspecified: Secondary | ICD-10-CM

## 2022-04-10 DIAGNOSIS — I10 Essential (primary) hypertension: Secondary | ICD-10-CM

## 2022-04-10 DIAGNOSIS — Z79899 Other long term (current) drug therapy: Secondary | ICD-10-CM

## 2022-04-10 DIAGNOSIS — E559 Vitamin D deficiency, unspecified: Secondary | ICD-10-CM | POA: Diagnosis not present

## 2022-04-10 DIAGNOSIS — Z0001 Encounter for general adult medical examination with abnormal findings: Secondary | ICD-10-CM

## 2022-04-10 DIAGNOSIS — R1319 Other dysphagia: Secondary | ICD-10-CM

## 2022-04-10 DIAGNOSIS — K449 Diaphragmatic hernia without obstruction or gangrene: Secondary | ICD-10-CM | POA: Diagnosis not present

## 2022-04-10 DIAGNOSIS — K219 Gastro-esophageal reflux disease without esophagitis: Secondary | ICD-10-CM | POA: Diagnosis not present

## 2022-04-10 DIAGNOSIS — Z0000 Encounter for general adult medical examination without abnormal findings: Secondary | ICD-10-CM

## 2022-04-10 DIAGNOSIS — E1169 Type 2 diabetes mellitus with other specified complication: Secondary | ICD-10-CM | POA: Diagnosis not present

## 2022-04-10 DIAGNOSIS — R7309 Other abnormal glucose: Secondary | ICD-10-CM | POA: Diagnosis not present

## 2022-04-10 DIAGNOSIS — R911 Solitary pulmonary nodule: Secondary | ICD-10-CM

## 2022-04-10 DIAGNOSIS — E1122 Type 2 diabetes mellitus with diabetic chronic kidney disease: Secondary | ICD-10-CM

## 2022-04-10 DIAGNOSIS — Z1211 Encounter for screening for malignant neoplasm of colon: Secondary | ICD-10-CM

## 2022-04-10 DIAGNOSIS — E785 Hyperlipidemia, unspecified: Secondary | ICD-10-CM | POA: Diagnosis not present

## 2022-04-10 DIAGNOSIS — I700 Atherosclerosis of aorta: Secondary | ICD-10-CM

## 2022-04-10 DIAGNOSIS — E0590 Thyrotoxicosis, unspecified without thyrotoxic crisis or storm: Secondary | ICD-10-CM | POA: Diagnosis not present

## 2022-04-10 NOTE — Progress Notes (Signed)
MEDICARE ANNUAL WELLNESS VISIT AND 3 MONTH FOLLOW UP Assessment:   Jesus Bush was seen today for medicare wellness, follow-up and insomnia.  Diagnoses and all orders for this visit:  Encounter for Medicare annual wellness exam Due annually Health Maintenance reviewed  Essential hypertension Controlled  Continue medications;  Discussed DASH (Dietary Approaches to Stop Hypertension) DASH diet is lower in sodium than a typical American diet. Cut back on foods that are high in saturated fat, cholesterol, and trans fats. Eat more whole-grain foods, fish, poultry, and nuts Remain active and exercise as tolerated daily.  Monitor BP at home-Call if greater than 130/80.    Aortic atherosclerosis ([place]) - CT 2021 Continue Rosuvastatin. Continue annual CT screenings.  Lifestyle modifications. Smoking cessation provided.   Chronic obstructive pulmonary disease, unspecified COPD type ([place]) Continue inhalers, nebulizer's, Smoking cessation provided.  Continue annual CT screenings. Continue to monitor Report to ER for any increase in difficulty breathing.  Cigarette nicotine dependence without complication/Smoker Smoking cessation provided.  Nodule of left lung Additional pulmonary nodules are stable from December 2021 lung cancer screening exam. Due 04/2022 Continue to monitor  Gastroesophageal reflux disease, unspecified whether esophagitis present Discussed EGD is s/s fail to improve. Restart Protonix and Pepcid. Elevated HOB. Avoid triggers. Avoid laying down 2-3 hours after eating.  Esophageal dysphagia/hernia SLP modified barium swallow completed 09/2021 - Normal Exam Ambulatory referral to Gastroenterology was not completed.  Hyperthyroidism Continues to be low. No concerning symptoms. Continue to monitor  Mixed hyperlipidemia Controlled Continue medications;  Discussed lifestyle modifications. Recommended diet heavy in fruits and veggies, omega 3's. Decrease  consumption of animal meats, cheeses, and dairy products. Remain active and exercise as tolerated. Continue to monitor.  Abnormal glucose (prediabetes) Continue to incorporate lifestyle modifications. Reduce amount of sugars, carbohydrates you eat. Continue to monitor  Vitamin D deficiency At goal. Monitor levels  Medication management All medications discussed and reviewed in full. All questions and concerns regarding medications addressed.    DM2 with Stage 2 Kidney Disease Education: Reviewed 'ABCs' of diabetes management  Discussed goals to be met and/or maintained include A1C (<7) Blood pressure (<130/80) Cholesterol (LDL <70) Continue Eye Exam yearly  Continue Dental Exam Q6 mo Discussed dietary recommendations Discussed Physical Activity recommendations Check A1C Discussed how what you eat and drink can aide in kidney protection. Stay well hydrated. Avoid high salt foods. Avoid NSAIDS. Keep BP and BG well controlled.   Take medications as prescribed. Remain active and exercise as tolerated daily. Maintain weight.  Continue to monitor. Check CMP/GFR/Microablumin  Screening for colorectal cancer Cologuard ordered  Orders Placed This Encounter  Procedures   CT CHEST LUNG CA SCREEN LOW DOSE W/O CM    Standing Status:   Future    Standing Expiration Date:   04/11/2023    Order Specific Question:   Preferred Imaging Location?    Answer:   [id number]   CBC with Differential/Platelet   COMPLETE METABOLIC PANEL WITH GFR   Magnesium   Lipid panel   TSH   Hemoglobin A1c   VITAMIN D 25 Hydroxy (Vit-D Deficiency, Fractures)   Cologuard     Notify office for further evaluation and treatment, questions or concerns if any reported s/s fail to improve.   The patient was advised to call back or seek an in-person evaluation if any symptoms worsen or if the condition fails to improve as anticipated.   Further disposition pending results of labs. Discussed  med's effects and SE's.    I discussed  the assessment and treatment plan with the patient. The patient was provided an opportunity to ask questions and all were answered. The patient agreed with the plan and demonstrated an understanding of the instructions.  Discussed med's effects and SE's. Screening labs and tests as requested with regular follow-up as recommended.  I provided 40 minutes of face-to-face time during this encounter including counseling, chart review, and critical decision making was preformed.  Future Appointments  Date Time Provider [place]  07/18/2022  3:30 PM Darrol Jump, NP [facility] None  01/11/2023 11:00 AM Unk Pinto, MD [facility] None  05/15/2023 11:00 AM Darrol Jump, NP [facility] None    Plan:   During the course of the visit the patient was educated and counseled about appropriate screening and preventive services including:   Pneumococcal vaccine  Influenza vaccine Prevnar 13 Td vaccine Screening electrocardiogram Colorectal cancer screening Diabetes screening Glaucoma screening Nutrition counseling    Subjective:  Jesus Bush is a 77 y.o. male who presents for Medicare Annual Wellness Visit and 3 month follow up.  His wife has recently suffered a fall and broken her right ankle.  She is currently in therapy.  He is continuing to care for her.  Overall feels as though he is managing well.     He shares that he continues to be a current every day smoker.  Last CT Scan 04/2021 revealed no suspicious pulmonary nodule or mass in the left lung base. Additional pulmonary nodules are stable from December 2021 lung cancer screening exam.  He continues to show emphysema with prominent central bronchial thickening.  He sues inhalers PRN.  Denies any new SOB.    He had a swallow evaluation completed  09/2021 for reports of dysphagia.  Normal study.  Incidental finding of a small to moderate-sized hiatal hernia. Patulous esophagus  with intraluminal fluid to the level of the thoracic inlet, can be seen with reflux.   He has GERD, reports recently poorly controlled particular in the evening, has severe reflux if lies down sooner than 4-5 hours, with all foods.  He has not raised the [facility] but thinking of purchasing a new mattress that elevates. He has now been taking PPI or H2 daily with reports of worsening GERD. He was referred to GI in the past but has not followed through with appointment.   2023 CT scan also continues to reveal aortic atherosclerosis.  Coronary artery calcifications.  He is continuing statin.   BMI is Body mass index is 23.58 kg/m., he has been working on diet and exercise.   Wt Readings from Last 3 Encounters:  04/10/22 162 lb (73.5 kg)  02/08/22 162 lb (73.5 kg)  01/08/22 160 lb 9.6 oz (72.8 kg)   His blood pressure has been controlled at home, today their BP is BP: 118/76 He does not workout. He denies chest pain, shortness of breath, dizziness.   He has COPD, for mer smoker.  He had CT last year that showed nodule and due for follow up this year.  His blood pressure has been controlled at home, today their BP is BP: 118/76  He does workout. He denies chest pain, shortness of breath, dizziness.   He is on cholesterol medication (rosuvastin 40 mg daily) and denies myalgias. His cholesterol is at goal. The cholesterol last visit was:   Lab Results  Component Value Date   CHOL 164 01/08/2022   HDL 35 (L) 01/08/2022   LDLCALC 86 01/08/2022   TRIG 357 (H) 01/08/2022  CHOLHDL 4.7 01/08/2022    He has been working on diet and exercise for prediabetes, and denies polydipsia, polyuria, visual disturbances, vomiting, and weight loss. Last A1C in the office was:  Lab Results  Component Value Date   HGBA1C 6.5 (H) 01/08/2022   Patient is on Vitamin D supplement.   Lab Results  Component Value Date   VD25OH 18 01/08/2022     He is not on thyroid medication but has been mildly hyperthyroid,  monitoring. Lab Results  Component Value Date   TSH 0.56 01/08/2022  .   Medication Review: Current Outpatient Medications on File Prior to Visit  Medication Sig Dispense Refill   ACCU-CHEK SOFTCLIX LANCETS lancets Check blood sugar 1 time daily-DX-R73.09 100 each 3   albuterol (VENTOLIN HFA) 108 (90 Base) MCG/ACT inhaler 2 inhalations 15 minutes apart every 4 hours to Rescue Asthma 48 g 3   Alcohol Swabs (B-D SINGLE USE SWABS REGULAR) PADS Check blood sugar 1 time daily-DX-R73.09. 100 each 3   aspirin 81 MG tablet Take 81 mg by mouth daily.     azithromycin (ZITHROMAX) 500 MG tablet Take 1 tablet (500 mg) daily for 10 days. 10 tablet 0   benzonatate (TESSALON PERLES) 100 MG capsule Take 1 capsule (100 mg total) by mouth 3 (three) times daily as needed for cough. 30 capsule 1   Blood Glucose Calibration (ACCU-CHEK AVIVA) SOLN Use per box instruction. 1 each 3   blood glucose meter kit and supplies KIT Test blood sugar daily or as directed 1 each 0   Blood Glucose Monitoring Suppl (ACCU-CHEK AVIVA PLUS) w/Device KIT Check blood sugar 1 time daily-DX-R73.09 1 kit 0   cetirizine (ZYRTEC ALLERGY) 10 MG tablet Take one tablet nightly for allergies. 90 tablet 1   Cholecalciferol (VITAMIN D) 2000 UNITS CAPS Take 2 capsules by mouth daily.      diltiazem (DILACOR XR) 240 MG 24 hr capsule TAKE 1 CAPSULE DAILY FOR BLOOD PRESSURE AND HEART RHYTHM 90 capsule 3   famotidine (PEPCID) 40 MG tablet Take 1 tablet (40 mg total) by mouth 2 (two) times daily. 90 tablet 3   Fluticasone-Umeclidin-Vilant (TRELEGY ELLIPTA) 200-62.5-25 MCG/ACT AEPB Use  1 Inhalation  Daily  for COPD /Asthma 180 each 3   glucose blood (ACCU-CHEK AVIVA PLUS) test strip Check blood sugar 1 time daily-R73.09. 100 each 3   ipratropium-albuterol (DUONEB) 0.5-2.5 (3) MG/3ML SOLN Inhale 1 vial (3 mL) every 4 to 6 hours PRN. 360 mL 2   losartan (COZAAR) 100 MG tablet TAKE 1 TABLET EVERY DAY FOR BLOOD PRESSURE 90 tablet 3   magnesium  gluconate (MAGONATE) 500 MG tablet Take 500 mg by mouth daily.      metoCLOPramide (REGLAN) 10 MG tablet Take  1 tablet  3 x/ day  with Lunch, Supper & Bedtime for Acid Indigestion & Reflux. 90 tablet 1   pantoprazole (PROTONIX) 40 MG tablet Take  1 tablet  2 x /day  to Prevent Indigestion & Heartburn 180 tablet 3   promethazine-dextromethorphan (PROMETHAZINE-DM) 6.25-15 MG/5ML syrup Take 5 mLs by mouth 4 (four) times daily as needed for cough. 240 mL 0   rosuvastatin (CRESTOR) 40 MG tablet TAKE 1 TABLET DAILY FOR CHOLESTEROL 90 tablet 3   zinc gluconate 50 MG tablet Take 50 mg by mouth daily.     No current facility-administered medications on file prior to visit.    Current Problems (verified) Patient Active Problem List   Diagnosis Date Noted   Coronary atherosclerosis 09/17/2020  Sebaceous cyst 09/16/2020   Nodule of left lung 02/05/2019   Hyperthyroidism 06/28/2018   Aortic atherosclerosis ([place]) 09/09/2017   COPD (chronic obstructive pulmonary disease) ([place]) 11/08/2016   Esophageal reflux 04/25/2015   Former smoker (44 pack year hx, quit 2021) 07/23/2014   Medication management 04/13/2014   Essential hypertension 02/11/2013   Mixed hyperlipidemia 02/11/2013   Vitamin D deficiency 02/11/2013   Abnormal glucose (prediabetes) 05/12/2012    Screening Tests Immunization History  Administered Date(s) Administered   DTaP 05/23/2006   Influenza Whole 12/16/2012   Influenza, High Dose Seasonal PF 01/17/2015, 12/13/2015, 01/29/2017, 12/17/2017, 01/14/2019, 01/27/2020, 01/24/2021, 01/08/2022   Influenza-Unspecified 12/27/2013   PFIZER(Purple Top)SARS-COV-2 Vaccination 05/18/2019, 06/08/2019   Pneumococcal Conjugate-13 04/13/2014   Pneumococcal Polysaccharide-23 09/13/2008, 08/06/2016   Td 08/06/2016   Zoster, Live 04/17/2013    Preventative care: Last colonoscopy: 2009, declines further  Cologuard: 08/2017 neg - due and ordered CT 02/2020, then 04/2021  - begnin nodule, follow  up  yearly - ordered  Prior vaccinations: TD or Tdap: 2018 Influenza: 12/2021 Pneumococcal: 2018  Prevnar13: 2016 Shingles/Zostavax: 2015, discuss shingrix next   Names of Other Physician/Practitioners you currently use: 1. [facility] here for primary care 2. Dr. Einar Gip, eye doctor, last visit 2022 3. Does not see, dentures, dentist  Patient Care Team: Unk Pinto, MD as PCP - General (Internal Medicine) Unk Pinto, MD (Internal Medicine) Inda Castle, MD (Inactive) as Consulting Physician (Gastroenterology)  Allergies Allergies  Allergen Reactions   Ppd [Tuberculin Purified Protein Derivative]     Positive PPD.    SURGICAL HISTORY He  has a past surgical history that includes Cataract extraction, bilateral (Bilateral, 2018). FAMILY HISTORY His family history includes Diabetes in his sister; Heart attack in his brother, father, and mother; Heart disease in his father, mother, and sister; Hypertension in his brother. SOCIAL HISTORY He  reports that he has been smoking cigarettes. He started smoking about 37 years ago. He has a 44.00 pack-year smoking history. He has never used smokeless tobacco. He reports that he does not drink alcohol and does not use drugs.  MEDICARE WELLNESS OBJECTIVES: Physical activity:   Cardiac risk factors:   Depression/mood screen:      04/10/2022   12:11 PM  Depression screen PHQ 2/9  Decreased Interest 0  Down, Depressed, Hopeless 0  PHQ - 2 Score 0    ADLs:     04/10/2022   12:10 PM 01/07/2022    9:54 PM  In your present state of health, do you have any difficulty performing the following activities:  Hearing? 0 0  Vision? 0 0  Difficulty concentrating or making decisions? 0 0  Walking or climbing stairs? 0 0  Dressing or bathing? 0 0  Doing errands, shopping? 0 0  Preparing Food and eating ? N   Using the Toilet? N   In the past six months, have you accidently leaked urine?  N   Do you have problems with loss of bowel control? N   Managing your Medications? N   Managing your Finances? N   Housekeeping or managing your Housekeeping? N       Cognitive Testing  Alert? Yes  Normal Appearance?Yes  Oriented to person? Yes  Place? Yes   Time? Yes  Recall of three objects?  Yes  Can perform simple calculations? Yes  Displays appropriate judgment?Yes  Can read the correct time from a watch face?Yes  EOL planning: Does Patient Have a Medical Advance Directive?:  No   Objective:   Today's Vitals   04/10/22 1120  BP: 118/76  Pulse: 67  Temp: (!) 97.3 F (36.3 C)  SpO2: 97%  Weight: 162 lb (73.5 kg)  Height: 5' 9.5" (1.765 m)    Body mass index is 23.58 kg/m.  General Appearance: Well nourished, in no apparent distress. Eyes: PERRLA, EOMs, conjunctiva no swelling or erythema Sinuses: No Frontal/maxillary tenderness ENT/Mouth: Ext aud canals clear, TMs without erythema, bulging. No erythema, swelling, or exudate on post pharynx.  Tonsils not swollen or erythematous. Hearing normal.  Neck: Supple, thyroid normal.  Respiratory: Respiratory effort normal, BS diminished throughout, limited expansion without rales, he does have scattered rhonchi, without wheezing or stridor.  Cardio: RRR with no MRGs. Brisk peripheral pulses without edema.  Abdomen: Soft, + BS.  Non tender, no guarding, rebound, hernias, masses. Lymphatics: Non tender without lymphadenopathy.  Musculoskeletal: Full ROM, 5/5 strength, Normal gait Skin: Warm, dry without rashes, lesions, ecchymosis. He has lump approx 2-3 cm area with some fluctuance and rubbery texture, smooth, mobile to left upper back.  Neuro: Cranial nerves intact. No cerebellar symptoms.  Psych: Awake and oriented X 3, normal affect, Insight and Judgment appropriate.    Medicare Attestation I have personally reviewed: The patient's medical and social history Their use of alcohol, tobacco or illicit drugs Their  current medications and supplements The patient's functional ability including ADLs,fall risks, home safety risks, cognitive, and hearing and visual impairment Diet and physical activities Evidence for depression or mood disorders  The patient's weight, height, BMI, and visual acuity have been recorded in the chart.  I have made referrals, counseling, and provided education to the patient based on review of the above and I have provided the patient with a written personalized care plan for preventive services.     Darrol Jump, NP   07/16/2019

## 2022-04-10 NOTE — Patient Instructions (Signed)
Managing the Challenge of Quitting Smoking Quitting smoking is a physical and mental challenge. You may have cravings, withdrawal symptoms, and temptation to smoke. Before quitting, work with your health care provider to make a plan that can help you manage quitting. Making a plan before you quit may keep you from smoking when you have the urge to smoke while trying to quit. How to manage lifestyle changes Managing stress Stress can make you want to smoke, and wanting to smoke may cause stress. It is important to find ways to manage your stress. You could try some of the following: Practice relaxation techniques. Breathe slowly and deeply, in through your nose and out through your mouth. Listen to music. Soak in a bath or take a shower. Imagine a peaceful place or vacation. Get some support. Talk with family or friends about your stress. Join a support group. Talk with a counselor or therapist. Get some physical activity. Go for a walk, run, or bike ride. Play a favorite sport. Practice yoga.  Medicines Talk with your health care provider about medicines that might help you deal with cravings and make quitting easier for you. Relationships Social situations can be difficult when you are quitting smoking. To manage this, you can: Avoid parties and other social situations where people might be smoking. Avoid alcohol. Leave right away if you have the urge to smoke. Explain to your family and friends that you are quitting smoking. Ask for support and let them know you might be a bit grumpy. Plan activities where smoking is not an option. General instructions Be aware that many people gain weight after they quit smoking. However, not everyone does. To keep from gaining weight, have a plan in place before you quit, and stick to the plan after you quit. Your plan should include: Eating healthy snacks. When you have a craving, it may help to: Eat popcorn, or try carrots, celery, or other cut  vegetables. Chew sugar-free gum. Changing how you eat. Eat small portion sizes at meals. Eat 4-6 small meals throughout the day instead of 1-2 large meals a day. Be mindful when you eat. You should avoid watching television or doing other things that might distract you as you eat. Exercising regularly. Make time to exercise each day. If you do not have time for a long workout, do short bouts of exercise for 5-10 minutes several times a day. Do some form of strengthening exercise, such as weight lifting. Do some exercise that gets your heart beating and causes you to breathe deeply, such as walking fast, running, swimming, or biking. This is very important. Drinking plenty of water or other low-calorie or no-calorie drinks. Drink enough fluid to keep your urine pale yellow.  How to recognize withdrawal symptoms Your body and mind may experience discomfort as you try to get used to not having nicotine in your system. These effects are called withdrawal symptoms. They may include: Feeling hungrier than normal. Having trouble concentrating. Feeling irritable or restless. Having trouble sleeping. Feeling depressed. Craving a cigarette. These symptoms may surprise you, but they are normal to have when quitting smoking. To manage withdrawal symptoms: Avoid places, people, and activities that trigger your cravings. Remember why you want to quit. Get plenty of sleep. Avoid coffee and other drinks that contain caffeine. These may worsen some of your symptoms. How to manage cravings Come up with a plan for how to deal with your cravings. The plan should include the following: A definition of the specific situation   you want to deal with. An activity or action you will take to replace smoking. A clear idea for how this action will help. The name of someone who could help you with this. Cravings usually last for 5-10 minutes. Consider taking the following actions to help you with your plan to deal  with cravings: Keep your mouth busy. Chew sugar-free gum. Suck on hard candies or a straw. Brush your teeth. Keep your hands and body busy. Change to a different activity right away. Squeeze or play with a ball. Do an activity or a hobby, such as making bead jewelry, practicing needlepoint, or working with wood. Mix up your normal routine. Take a short exercise break. Go for a quick walk, or run up and down stairs. Focus on doing something kind or helpful for someone else. Call a friend or family member to talk during a craving. Join a support group. Contact a quitline. Where to find support To get help or find a support group: Call the [facility]'s Smoking Quitline: 1-800-QUIT-NOW (217)085-9545) Text QUIT to SmokefreeTXT: 916384 Where to find more information Visit these websites to find more information on quitting smoking: U.[place]. Department of Health and Human Services: www.smokefree.gov American Lung Association: www.freedomfromsmoking.org Centers for Disease Control and Prevention (CDC): http://www.wolf.info/ American Heart Association: www.heart.org Contact a health care provider if: You want to change your plan for quitting. The medicines you are taking are not helping. Your eating feels out of control or you cannot sleep. You feel depressed or become very anxious. Summary Quitting smoking is a physical and mental challenge. You will face cravings, withdrawal symptoms, and temptation to smoke again. Preparation can help you as you go through these challenges. Try different techniques to manage stress, handle social situations, and prevent weight gain. You can deal with cravings by keeping your mouth busy (such as by chewing gum), keeping your hands and body busy, calling family or friends, or contacting a quitline for people who want to quit smoking. You can deal with withdrawal symptoms by avoiding places where people smoke, getting plenty of rest, and avoiding drinks that  contain caffeine. This information is not intended to replace advice given to you by your health care provider. Make sure you discuss any questions you have with your health care provider. Document Revised: 03/03/2021 Document Reviewed: 03/03/2021 Elsevier Patient Education  [place].   Heartburn Heartburn is a type of pain or discomfort that can happen in your throat or chest. It is often described as a burning pain. It may also cause a bad, acid-like taste in your mouth. It may be caused by stomach contents that move back up (reflux) into the part of the body that moves food from your mouth to your stomach (esophagus). Heartburn may feel worse: When you lie down. When you bend over. At night. Follow these instructions at home: Eating and drinking  Avoid certain foods and drinks as told by your doctor. This may include: Coffee and tea, with or without caffeine. Drinks that have alcohol. Energy drinks and sports drinks. Carbonated drinks or sodas. Chocolate and cocoa. Peppermint and mint flavorings. Garlic and onions. Horseradish. Spicy and acidic foods, such as: Peppers. Chili powder and curry powder. Vinegar. Hot sauces and BBQ sauce. Citrus fruit juices and citrus fruits, such as: Oranges. Lemons. Limes. Tomato-based foods, such as: Red sauce and pizza with red sauce. Chili. Salsa. Fried and fatty foods, such as: Donuts. [place] fries and potato chips. High-fat dressings. High-fat meats, such as: Hot  dogs and sausage. Rib eye steak. Ham and bacon. High-fat dairy items, such as: Whole milk. Butter. Cream cheese. Eat small meals often. Avoid eating large meals. Avoid drinking large amounts of liquid with your meals. Avoid eating meals during the 2-3 hours before bedtime. Avoid lying down right after you eat. Do not exercise right after you eat. Lifestyle     If you are overweight, lose an amount of weight that is healthy for you. Ask your doctor  about a safe weight loss goal. Do not smoke or use any products that contain nicotine or tobacco. These can make your symptoms worse. If you need help quitting, ask your doctor. Wear loose clothes. Do not wear anything tight around your waist. Raise (elevate) the head of your bed about 6 inches (15 cm) when you sleep. You can use a wedge to do this. Try to lower your stress. If you need help doing this, ask your doctor. Medicines Take over-the-counter and prescription medicines only as told by your doctor. Do not take aspirin or NSAIDs, such as ibuprofen, unless your doctor says it is okay. Stop medicines only as told by your doctor. If you stop taking some medicines too quickly, your symptoms may get worse. General instructions Watch for any changes in your symptoms. Keep all follow-up visits. Contact a doctor if: You have new symptoms. You lose weight and you do not know why. You have trouble swallowing, or it hurts to swallow. You have wheezing or a cough that keeps happening. Your symptoms do not get better with treatment. You have heartburn often for more than 2 weeks. Get help right away if: You have pain in your arms, neck, jaw, teeth, or back all of a sudden. You feel sweaty, dizzy, or light-headed all of a sudden. You have chest pain or shortness of breath. You vomit and your vomit looks like blood or coffee grounds. Your poop (stool) is bloody or black. These symptoms may be an emergency. Get help right away. Call your local emergency services (911 in the [place] Do not wait to see if the symptoms will go away. Do not drive yourself to the hospital. Summary Heartburn is a type of pain that can happen in your throat or chest. It can feel like a burning pain. It may also cause a bad, acid-like taste in your mouth. You may need to avoid certain foods and drinks to help your symptoms. Ask your doctor what foods and drinks you should avoid. Take over-the-counter and prescription  medicines only as told by your doctor. Do not take aspirin or NSAIDs, such as ibuprofen, unless your doctor told you to do so. Contact your doctor if your symptoms do not get better or they get worse. This information is not intended to replace advice given to you by your health care provider. Make sure you discuss any questions you have with your health care provider. Document Revised: 09/16/2019 Document Reviewed: 09/16/2019 Elsevier Patient Education  [place].   Hiatal Hernia  A hiatal hernia occurs when part of the stomach slides above the muscle that separates the abdomen from the chest (diaphragm). A person can be born with a hiatal hernia (congenital), or it may develop over time. In almost all cases of hiatal hernia, only the top part of the stomach pushes through the diaphragm. Many people have a hiatal hernia with no symptoms. The larger the hernia, the more likely it is that you will have symptoms. In some cases, a hiatal hernia allows  stomach acid to flow back into the tube that carries food from your mouth to your stomach (esophagus). This may cause heartburn symptoms. The development of heartburn symptoms may mean that you have a condition called gastroesophageal reflux disease (GERD). What are the causes? This condition is caused by a weakness in the opening (hiatus) where the esophagus passes through the diaphragm to attach to the upper part of the stomach. A person may be born with a weakness in the hiatus, or a weakness can develop over time. What increases the risk? This condition is more likely to develop in: Older people. Age is a major risk factor for a hiatal hernia, especially if you are over the age of 31. Pregnant women. People who are overweight. People who have frequent constipation. What are the signs or symptoms? Symptoms of this condition usually develop in the form of GERD symptoms. Symptoms include: Heartburn. Upset stomach (indigestion). Trouble  swallowing. Coughing or wheezing. Wheezing is making high-pitched whistling sounds when you breathe. Sore throat. Chest pain. Nausea and vomiting. How is this diagnosed? This condition may be diagnosed during testing for GERD. Tests that may be done include: X-rays of your stomach or chest. An upper gastrointestinal (GI) series. This is an X-ray exam of your GI tract that is taken after you swallow a chalky liquid that shows up clearly on the X-ray. Endoscopy. This is a procedure to look into your stomach using a thin, flexible tube that has a tiny camera and light on the end of it. How is this treated? This condition may be treated by: Dietary and lifestyle changes to help reduce GERD symptoms. Medicines. These may include: Over-the-counter antacids. Medicines that make your stomach empty more quickly. Medicines that block the production of stomach acid (H2 blockers). Stronger medicines to reduce stomach acid (proton pump inhibitors). Surgery to repair the hernia, if other treatments are not helping. If you have no symptoms, you may not need treatment. Follow these instructions at home: Lifestyle and activity Do not use any products that contain nicotine or tobacco. These products include cigarettes, chewing tobacco, and vaping devices, such as e-cigarettes. If you need help quitting, ask your health care provider. Try to achieve and maintain a healthy body weight. Avoid putting pressure on your abdomen. Anything that puts pressure on your abdomen increases the amount of acid that may be pushed up into your esophagus. Avoid bending over, especially after eating. Raise the head of your bed by putting blocks under the legs. This keeps your head and esophagus higher than your stomach. Do not wear tight clothing around your chest or stomach. Try not to strain when having a bowel movement, when urinating, or when lifting heavy objects. Eating and drinking Avoid foods that can worsen GERD  symptoms. These may include: Fatty foods, like fried foods. Citrus fruits, like oranges or lemon. Other foods and drinks that contain acid, like orange juice or tomatoes. Spicy food. Chocolate. Eat frequent small meals instead of three large meals a day. This helps prevent your stomach from getting too full. Eat slowly. Do not lie down right after eating. Do not eat 1-2 hours before bed. Do not drink beverages with caffeine. These include cola, coffee, cocoa, and tea. Do not drink alcohol. General instructions Take over-the-counter and prescription medicines only as told by your health care provider. Keep all follow-up visits. Your health care provider will want to check that any new prescribed medicines are helping your symptoms. Contact a health care provider if: Your symptoms  are not controlled with medicines or lifestyle changes. You are having trouble swallowing. You have coughing or wheezing that will not go away. Your pain is getting worse. Your pain spreads to your arms, neck, jaw, teeth, or back. You feel nauseous or you vomit. Get help right away if: You have shortness of breath. You vomit blood. You have bright red blood in your stools. You have black, tarry stools. These symptoms may be an emergency. Get help right away. Call 911. Do not wait to see if the symptoms will go away. Do not drive yourself to the hospital. Summary A hiatal hernia occurs when part of the stomach slides above the muscle that separates the abdomen from the chest. A person may be born with a weakness in the hiatus, or a weakness can develop over time. Symptoms of a hiatal hernia may include heartburn, trouble swallowing, or sore throat. Management of a hiatal hernia includes eating frequent small meals instead of three large meals a day. Get help right away if you vomit blood, have bright red blood in your stools, or have black, tarry stools. This information is not intended to replace advice  given to you by your health care provider. Make sure you discuss any questions you have with your health care provider. Document Revised: 05/09/2021 Document Reviewed: 05/09/2021 Elsevier Patient Education  [place].

## 2022-04-11 LAB — LIPID PANEL
Cholesterol: 157 mg/dL (ref <200)
HDL: 37 mg/dL — ABNORMAL LOW (ref >=40)
LDL Cholesterol (Calc): 94 mg/dL (calc)
Non-HDL Cholesterol (Calc): 120 mg/dL (calc) (ref <130)
Total CHOL/HDL Ratio: 4.2 (calc) (ref <5.0)
Triglycerides: 160 mg/dL — ABNORMAL HIGH (ref <150)

## 2022-04-11 LAB — CBC WITH DIFFERENTIAL/PLATELET
Absolute Monocytes: 670 cells/uL (ref 200–950)
Basophils Absolute: 72 cells/uL (ref 0–200)
Basophils Relative: 1 %
Eosinophils Absolute: 50 cells/uL (ref 15–500)
Eosinophils Relative: 0.7 %
HCT: 45 % (ref 38.5–50.0)
Hemoglobin: 15.1 g/dL (ref 13.2–17.1)
Lymphs Abs: 1894 cells/uL (ref 850–3900)
MCH: 31.9 pg (ref 27.0–33.0)
MCHC: 33.6 g/dL (ref 32.0–36.0)
MCV: 95.1 fL (ref 80.0–100.0)
MPV: 10.8 fL (ref 7.5–12.5)
Monocytes Relative: 9.3 %
Neutro Abs: 4514 cells/uL (ref 1500–7800)
Neutrophils Relative %: 62.7 %
Platelets: 259 10*3/uL (ref 140–400)
RBC: 4.73 10*6/uL (ref 4.20–5.80)
RDW: 12.6 % (ref 11.0–15.0)
Total Lymphocyte: 26.3 %
WBC: 7.2 10*3/uL (ref 3.8–10.8)

## 2022-04-11 LAB — COMPLETE METABOLIC PANEL WITH GFR
AG Ratio: 1.5 (calc) (ref 1.0–2.5)
ALT: 29 U/L (ref 9–46)
AST: 24 U/L (ref 10–35)
Albumin: 4.4 g/dL (ref 3.6–5.1)
Alkaline phosphatase (APISO): 89 U/L (ref 35–144)
BUN: 14 mg/dL (ref 7–25)
CO2: 28 mmol/L (ref 20–32)
Calcium: 9.9 mg/dL (ref 8.6–10.3)
Chloride: 102 mmol/L (ref 98–110)
Creat: 0.91 mg/dL (ref 0.70–1.28)
Globulin: 2.9 g/dL (calc) (ref 1.9–3.7)
Glucose, Bld: 98 mg/dL (ref 65–99)
Potassium: 4.4 mmol/L (ref 3.5–5.3)
Sodium: 138 mmol/L (ref 135–146)
Total Bilirubin: 0.5 mg/dL (ref 0.2–1.2)
Total Protein: 7.3 g/dL (ref 6.1–8.1)
eGFR: 87 mL/min/{1.73_m2} (ref >=60)

## 2022-04-11 LAB — HEMOGLOBIN A1C
Hgb A1c MFr Bld: 6.4 % of total Hgb — ABNORMAL HIGH (ref <5.7)
Mean Plasma Glucose: 137 mg/dL
eAG (mmol/L): 7.6 mmol/L

## 2022-04-11 LAB — VITAMIN D 25 HYDROXY (VIT D DEFICIENCY, FRACTURES): Vit D, 25-Hydroxy: 62 ng/mL (ref 30–100)

## 2022-04-11 LAB — MAGNESIUM: Magnesium: 1.9 mg/dL (ref 1.5–2.5)

## 2022-04-11 LAB — TSH: TSH: 0.43 mIU/L (ref 0.40–4.50)

## 2022-05-09 ENCOUNTER — Inpatient Hospital Stay: Admission: RE | Admit: 2022-05-09 | Payer: Medicare HMO | Source: Ambulatory Visit

## 2022-05-11 ENCOUNTER — Other Ambulatory Visit: Payer: Self-pay | Admitting: Nurse Practitioner

## 2022-05-11 DIAGNOSIS — J449 Chronic obstructive pulmonary disease, unspecified: Secondary | ICD-10-CM

## 2022-05-16 DIAGNOSIS — E119 Type 2 diabetes mellitus without complications: Secondary | ICD-10-CM | POA: Diagnosis not present

## 2022-05-16 DIAGNOSIS — H5203 Hypermetropia, bilateral: Secondary | ICD-10-CM | POA: Diagnosis not present

## 2022-05-16 DIAGNOSIS — Z961 Presence of intraocular lens: Secondary | ICD-10-CM | POA: Diagnosis not present

## 2022-05-16 DIAGNOSIS — H524 Presbyopia: Secondary | ICD-10-CM | POA: Diagnosis not present

## 2022-05-16 DIAGNOSIS — H52223 Regular astigmatism, bilateral: Secondary | ICD-10-CM | POA: Diagnosis not present

## 2022-06-01 ENCOUNTER — Encounter: Payer: Self-pay | Admitting: Nurse Practitioner

## 2022-06-01 ENCOUNTER — Ambulatory Visit (INDEPENDENT_AMBULATORY_CARE_PROVIDER_SITE_OTHER): Payer: Medicare HMO | Admitting: Nurse Practitioner

## 2022-06-01 ENCOUNTER — Other Ambulatory Visit: Payer: Self-pay

## 2022-06-01 VITALS — BP 120/70 | HR 64 | Temp 98.0°F | Resp 17 | Ht 69.5 in | Wt 160.0 lb

## 2022-06-01 DIAGNOSIS — G4483 Primary cough headache: Secondary | ICD-10-CM

## 2022-06-01 DIAGNOSIS — Z1152 Encounter for screening for COVID-19: Secondary | ICD-10-CM | POA: Diagnosis not present

## 2022-06-01 DIAGNOSIS — J449 Chronic obstructive pulmonary disease, unspecified: Secondary | ICD-10-CM

## 2022-06-01 DIAGNOSIS — I10 Essential (primary) hypertension: Secondary | ICD-10-CM | POA: Diagnosis not present

## 2022-06-01 DIAGNOSIS — J302 Other seasonal allergic rhinitis: Secondary | ICD-10-CM | POA: Diagnosis not present

## 2022-06-01 LAB — POC INFLUENZA A&B (BINAX/QUICKVUE)
Influenza A, POC: NEGATIVE
Influenza B, POC: NEGATIVE

## 2022-06-01 LAB — POC COVID19 BINAXNOW: SARS Coronavirus 2 Ag: NEGATIVE

## 2022-06-01 MED ORDER — MONTELUKAST SODIUM 10 MG PO TABS: 10.0000 mg | ORAL_TABLET | Freq: Every day | ORAL | 2 refills | Status: DC

## 2022-06-01 MED ORDER — FLUTICASONE PROPIONATE 50 MCG/ACT NA SUSP: NASAL | 2 refills | Status: DC

## 2022-06-01 MED ORDER — PROMETHAZINE-DM 6.25-15 MG/5ML PO SYRP: 5.0000 mL | ORAL_SOLUTION | Freq: Four times a day (QID) | ORAL | 1 refills | Status: DC | PRN

## 2022-06-01 NOTE — Progress Notes (Signed)
Assessment and Plan:  Loys was seen today for cough.  Diagnoses and all orders for this visit:  Cough headache -     POC Influenza A&B (Binax test)- negative -     POC COVID-19- negative  Essential hypertension - continue medications, DASH diet, exercise and monitor at home. Call if greater than 130/80.   Chronic obstructive pulmonary disease, unspecified COPD type ([place]) Continue current medications Monitor symptoms  Seasonal allergic rhinitis, unspecified trigger Begin Zyrtec daily in am, Singulair at night, Flonase 2 sprays each nostril once a day and Promethazine DM as needed for cough If symptoms do not improve notify the office -     montelukast (SINGULAIR) 10 MG tablet; Take 1 tablet (10 mg total) by mouth daily. -     fluticasone (FLONASE) 50 MCG/ACT nasal spray; 2 sprays each nostril daily -     promethazine-dextromethorphan (PROMETHAZINE-DM) 6.25-15 MG/5ML syrup; Take 5 mLs by mouth 4 (four) times daily as needed for cough.       Further disposition pending results of labs. Discussed med's effects and SE's.   Over 30 minutes of exam, counseling, chart review, and critical decision making was performed.   Future Appointments  Date Time Provider [address]  07/18/2022  3:30 PM Darrol Jump, NP [facility] None  01/11/2023 11:00 AM Unk Pinto, MD [facility] None  05/15/2023 11:00 AM Darrol Jump, NP [facility] None    ------------------------------------------------------------------------------------------------------------------   HPI BP 120/70   Pulse 64   Temp 98 F (36.7 C)   Resp 17   Ht 5' 9.5" (1.765 m)   Wt 160 lb (72.6 kg)   SpO2 99%   BMI 23.29 kg/m   77 y.o.male presents for head congestion and cough of yellow mucus  x 1 week. Denies sore throat, fever, nausea, vomiting and diarrhea.  He does have COPD for which he uses Trelegy daily and Albuterol as needed. He is going on a cruise next week and does not want to be sick.  Covid  and Flu test at office today are negative.  His BP is currently well controlled on  diltiazem 240 mg QD, Losartan 100 mg QD. Denies headaches, chest pain dizziness and shortness of breath.  BP Readings from Last 3 Encounters:  06/01/22 120/70  04/10/22 118/76  02/08/22 118/64   BMI is Body mass index is 23.29 kg/m., he has been working on diet and exercise. Wt Readings from Last 3 Encounters:  06/01/22 160 lb (72.6 kg)  04/10/22 162 lb (73.5 kg)  02/08/22 162 lb (73.5 kg)     Past Medical History:  Diagnosis Date   Diabetes mellitus without complication ([place])    Hyperlipidemia    Hypertension    Vitamin D deficiency      Allergies  Allergen Reactions   Ppd [Tuberculin Purified Protein Derivative]     Positive PPD.    Current Outpatient Medications on File Prior to Visit  Medication Sig   ACCU-CHEK SOFTCLIX LANCETS lancets Check blood sugar 1 time daily-DX-R73.09   albuterol (VENTOLIN HFA) 108 (90 Base) MCG/ACT inhaler 2 inhalations 15 minutes apart every 4 hours to Rescue Asthma   Alcohol Swabs (B-D SINGLE USE SWABS REGULAR) PADS Check blood sugar 1 time daily-DX-R73.09.   aspirin 81 MG tablet Take 81 mg by mouth daily.   azithromycin (ZITHROMAX) 500 MG tablet Take 1 tablet (500 mg) daily for 10 days.   benzonatate (TESSALON PERLES) 100 MG capsule Take 1 capsule (100 mg total) by mouth 3 (three) times daily  as needed for cough.   Blood Glucose Calibration (ACCU-CHEK AVIVA) SOLN Use per box instruction.   blood glucose meter kit and supplies KIT Test blood sugar daily or as directed   Blood Glucose Monitoring Suppl (ACCU-CHEK AVIVA PLUS) w/Device KIT Check blood sugar 1 time daily-DX-R73.09   cetirizine (ZYRTEC ALLERGY) 10 MG tablet Take one tablet nightly for allergies.   Cholecalciferol (VITAMIN D) 2000 UNITS CAPS Take 2 capsules by mouth daily.    diltiazem (DILACOR XR) 240 MG 24 hr capsule TAKE 1 CAPSULE DAILY FOR BLOOD PRESSURE AND HEART RHYTHM   famotidine (PEPCID)  40 MG tablet Take 1 tablet (40 mg total) by mouth 2 (two) times daily.   Fluticasone-Umeclidin-Vilant (TRELEGY ELLIPTA) 200-62.5-25 MCG/ACT AEPB Use  1 Inhalation  Daily  for COPD /Asthma   glucose blood (ACCU-CHEK AVIVA PLUS) test strip Check blood sugar 1 time daily-R73.09.   ipratropium-albuterol (DUONEB) 0.5-2.5 (3) MG/3ML SOLN USE 1 VIAL IN NEBULIZER EVERY 4 TO 6 HOURS - As Needed   losartan (COZAAR) 100 MG tablet TAKE 1 TABLET EVERY DAY FOR BLOOD PRESSURE   magnesium gluconate (MAGONATE) 500 MG tablet Take 500 mg by mouth daily.    metoCLOPramide (REGLAN) 10 MG tablet Take  1 tablet  3 x/ day  with Lunch, Supper & Bedtime for Acid Indigestion & Reflux.   pantoprazole (PROTONIX) 40 MG tablet Take  1 tablet  2 x /day  to Prevent Indigestion & Heartburn   promethazine-dextromethorphan (PROMETHAZINE-DM) 6.25-15 MG/5ML syrup Take 5 mLs by mouth 4 (four) times daily as needed for cough.   rosuvastatin (CRESTOR) 40 MG tablet TAKE 1 TABLET DAILY FOR CHOLESTEROL   zinc gluconate 50 MG tablet Take 50 mg by mouth daily.   No current facility-administered medications on file prior to visit.    ROS: all negative except above.   Physical Exam:  BP 120/70   Pulse 64   Temp 98 F (36.7 C)   Resp 17   Ht 5' 9.5" (1.765 m)   Wt 160 lb (72.6 kg)   SpO2 99%   BMI 23.29 kg/m   General Appearance: Well nourished, in no apparent distress. Eyes: PERRLA, EOMs, conjunctiva no swelling or erythema Sinuses: No Frontal/maxillary tenderness ENT/Mouth: Ext aud canals clear, TMs without erythema, bulging. No erythema, swelling, or exudate on post pharynx.  Pale nasal mucosa Neck: Supple, thyroid normal.  Respiratory: Respiratory effort normal, BS equal bilaterally without rales, rhonchi, wheezing or stridor.  Cardio: RRR with no MRGs. Brisk peripheral pulses without edema.  Abdomen: Soft, + BS.  Non tender, no guarding, rebound, hernias, masses. Lymphatics: Non tender without lymphadenopathy.   Musculoskeletal: Full ROM, 5/5 strength, normal gait.  Skin: Warm, dry without rashes, lesions, ecchymosis.  Neuro: Cranial nerves intact. Normal muscle tone, no cerebellar symptoms. Sensation intact.  Psych: Awake and oriented X 3, normal affect, Insight and Judgment appropriate.     Alycia Rossetti, NP 11:55 AM [facility]

## 2022-06-01 NOTE — Patient Instructions (Signed)
Take generic Zyrtec from over the counter every morning  Start montelukast every night at bedtime  Start Flonase nasal spray 2 sprays each nostril once a day  Promethazine DM cough syrup 5 ml every 6 hours as needed- can make you sleepy   Allergic Rhinitis, Adult  Allergic rhinitis is an allergic reaction that affects the mucous membrane inside the nose. The mucous membrane is the tissue that produces mucus. There are two types of allergic rhinitis: Seasonal. This type is also called hay fever and happens only during certain seasons. Perennial. This type can happen at any time of the year. Allergic rhinitis cannot be spread from person to person. This condition can be mild, bad, or very bad. It can develop at any age and may be outgrown. What are the causes? This condition is caused by allergens. These are things that can cause an allergic reaction. Allergens may differ for seasonal allergic rhinitis and perennial allergic rhinitis. Seasonal allergic rhinitis is caused by pollen. Pollen can come from grasses, trees, and weeds. Perennial allergic rhinitis may be caused by: Dust mites. Proteins in a pet's pee (urine), saliva, or dander. Dander is dead skin cells from a pet. Smoke, mold, or car fumes. Remains of or waste from insects such as cockroaches. What increases the risk? You are more likely to develop this condition if you have a family history of allergies or other conditions related to allergies, including: Allergic conjunctivitis. This is irritation and swelling of parts of the eyes and eyelids. Asthma. This condition affects the lungs and makes it hard to breathe. Atopic dermatitis or eczema. This is long term (chronic) irritation and swelling of the skin. Food allergies. What are the signs or symptoms? Symptoms of this condition include: Sneezing or coughing. A stuffy nose (nasal congestion), itchy nose, or nasal discharge. Itchy eyes and tearing of the eyes. A feeling of  mucus dripping down the back of your throat (postnasal drip). This may cause a sore throat. Trouble sleeping. Tiredness. Headache. How is this diagnosed? This condition may be diagnosed with your symptoms, your medical history, and a physical exam. Your health care provider may check for related conditions, such as: Asthma. Pink eye. This is eye swelling and irritation caused by infection (conjunctivitis). Ear infection. Upper respiratory infection. This is an infection in the nose, throat, or upper airways. You may also have tests to find out which allergens cause your symptoms. These may include skin tests or blood tests. How is this treated? There is no cure for this condition, but treatment can help control symptoms. Treatment may include: Taking medicines that block allergy symptoms, such as corticosteroids (anti-inflammatories) and antihistamines. Medicine may be given as a shot, nasal spray, or pill. Avoiding any allergens. Being exposed again and again to tiny amounts of allergens to help you build a defense against allergens (allergenimmunotherapy). This is done if other treatments have not helped. It may include: Allergy shots. These are injected medicines that have small amounts of an allergen in them. Sublingual immunotherapy. This involves taking small doses of a medicine with an allergen in it under your tongue. If these treatments do not work, your provider may prescribe newer, stronger medicines. Follow these instructions at home: Avoiding allergens Find out what you are allergic to and avoid those allergens. These are some things you can do to help avoid allergens: If you have perennial allergies: Replace carpet with wood, tile, or vinyl flooring. Carpet can trap dander and dust. Do not smoke. Do not allow  smoking in your home Change your heating and air conditioning filters at least once a month. If you have seasonal allergies, take these steps during allergy season: Keep  windows closed as much as possible. Plan outdoor activities when pollen counts are lowest. Check pollen counts before you plan outdoor activities When coming indoors, change clothing and shower before sitting on furniture or bedding. If you have a pet in the house that produces allergens: Keep the pet out of the bedroom. Vacuum, sweep, and dust regularly. General instructions Take over-the-counter and prescription medicines only as told by your provider. Drink enough fluid to keep your pee pale yellow. Where to find more information American Academy of Allergy, Asthma & Immunology: aaaai.org Contact a health care provider if: You have a fever. You develop a cough that does not go away. You make high-pitched whistling sounds when you breathe, most often when you breathe out (wheeze). Your symptoms slow you down or stop you from doing your normal activities each day. Get help right away if: You have shortness of breath. This symptom may be an emergency. Get help right away. Call 911. Do not wait to see if the symptoms will go away. Do not drive yourself to the hospital. This information is not intended to replace advice given to you by your health care provider. Make sure you discuss any questions you have with your health care provider. Document Revised: 11/20/2021 Document Reviewed: 11/20/2021 Elsevier Patient Education  [place].

## 2022-07-18 ENCOUNTER — Encounter: Payer: Self-pay | Admitting: Nurse Practitioner

## 2022-07-18 ENCOUNTER — Ambulatory Visit (INDEPENDENT_AMBULATORY_CARE_PROVIDER_SITE_OTHER): Payer: Medicare HMO | Admitting: Nurse Practitioner

## 2022-07-18 VITALS — BP 110/68 | HR 68 | Temp 97.6°F | Ht 69.5 in | Wt 165.8 lb

## 2022-07-18 DIAGNOSIS — R7309 Other abnormal glucose: Secondary | ICD-10-CM | POA: Diagnosis not present

## 2022-07-18 DIAGNOSIS — J449 Chronic obstructive pulmonary disease, unspecified: Secondary | ICD-10-CM

## 2022-07-18 DIAGNOSIS — K219 Gastro-esophageal reflux disease without esophagitis: Secondary | ICD-10-CM

## 2022-07-18 DIAGNOSIS — I700 Atherosclerosis of aorta: Secondary | ICD-10-CM

## 2022-07-18 DIAGNOSIS — Z79899 Other long term (current) drug therapy: Secondary | ICD-10-CM

## 2022-07-18 DIAGNOSIS — R911 Solitary pulmonary nodule: Secondary | ICD-10-CM | POA: Diagnosis not present

## 2022-07-18 DIAGNOSIS — N182 Chronic kidney disease, stage 2 (mild): Secondary | ICD-10-CM

## 2022-07-18 DIAGNOSIS — Z87891 Personal history of nicotine dependence: Secondary | ICD-10-CM

## 2022-07-18 DIAGNOSIS — E1122 Type 2 diabetes mellitus with diabetic chronic kidney disease: Secondary | ICD-10-CM

## 2022-07-18 DIAGNOSIS — E0590 Thyrotoxicosis, unspecified without thyrotoxic crisis or storm: Secondary | ICD-10-CM | POA: Diagnosis not present

## 2022-07-18 DIAGNOSIS — M25512 Pain in left shoulder: Secondary | ICD-10-CM | POA: Diagnosis not present

## 2022-07-18 DIAGNOSIS — E559 Vitamin D deficiency, unspecified: Secondary | ICD-10-CM

## 2022-07-18 DIAGNOSIS — E782 Mixed hyperlipidemia: Secondary | ICD-10-CM

## 2022-07-18 DIAGNOSIS — I10 Essential (primary) hypertension: Secondary | ICD-10-CM | POA: Diagnosis not present

## 2022-07-18 LAB — CBC WITH DIFFERENTIAL/PLATELET
Absolute Monocytes: 992 cells/uL — ABNORMAL HIGH (ref 200–950)
Lymphs Abs: 2018 cells/uL (ref 850–3900)
MCH: 31.3 pg (ref 27.0–33.0)
MCV: 94.1 fL (ref 80.0–100.0)
Monocytes Relative: 11.4 %
Neutrophils Relative %: 63.1 %
RBC: 4.41 10*6/uL (ref 4.20–5.80)

## 2022-07-18 MED ORDER — DEXAMETHASONE SODIUM PHOSPHATE 10 MG/ML IJ SOLN
10.0000 mg | Freq: Once | INTRAMUSCULAR | Status: AC
Start: 2022-07-18 — End: 2022-07-18
  Administered 2022-07-18: 10 mg via INTRAMUSCULAR

## 2022-07-18 NOTE — Progress Notes (Signed)
3 MONTH FOLLOW UP Assessment:   Jesus Bush was seen today for a general follow up  Diagnoses and all orders for this visit:  Essential hypertension Controlled  Continue medications;  Discussed DASH (Dietary Approaches to Stop Hypertension) DASH diet is lower in sodium than a typical American diet. Cut back on foods that are high in saturated fat, cholesterol, and trans fats. Eat more whole-grain foods, fish, poultry, and nuts Remain active and exercise as tolerated daily.  Monitor BP at home-Call if greater than 130/80.    Aortic atherosclerosis (HCC) - CT 2021 Continue Rosuvastatin. Continue annual CT screenings.  Lifestyle modifications. Smoking cessation provided.   Chronic obstructive pulmonary disease, unspecified COPD type (HCC) Continue inhalers, nebulizer's, Smoking cessation provided.  Continue annual CT screenings. Continue to monitor Report to ER for any increase in difficulty breathing.  Former smoker  Smoking cessation provided.  Nodule of left lung Additional pulmonary nodules are stable from December 2021 lung cancer screening exam. Due 04/2022 Continue to monitor  Gastroesophageal reflux disease, unspecified whether esophagitis present Discussed EGD is s/s fail to improve. Restart Protonix and Pepcid. Elevated HOB. Avoid triggers. Avoid laying down 2-3 hours after eating.  Esophageal dysphagia/hernia SLP modified barium swallow completed 09/2021 - Normal Exam Ambulatory referral to Gastroenterology was not completed. Referral re-sent  Hyperthyroidism Continues to be low. No concerning symptoms. Continue to monitor  Mixed hyperlipidemia Controlled Continue medications;  Discussed lifestyle modifications. Recommended diet heavy in fruits and veggies, omega 3's. Decrease consumption of animal meats, cheeses, and dairy products. Remain active and exercise as tolerated. Continue to monitor.  Abnormal glucose (prediabetes) Continue to incorporate  lifestyle modifications. Reduce amount of sugars, carbohydrates you eat. Continue to monitor  Vitamin D deficiency At goal. Monitor levels  Medication management All medications discussed and reviewed in full. All questions and concerns regarding medications addressed.    DM2 with Stage 2 Kidney Disease Education: Reviewed 'ABCs' of diabetes management  Discussed goals to be met and/or maintained include A1C (<7) Blood pressure (<130/80) Cholesterol (LDL <70) Continue Eye Exam yearly  Continue Dental Exam Q6 mo Discussed dietary recommendations Discussed Physical Activity recommendations Check A1C Discussed how what you eat and drink can aide in kidney protection. Stay well hydrated. Avoid high salt foods. Avoid NSAIDS. Keep BP and BG well controlled.   Take medications as prescribed. Remain active and exercise as tolerated daily. Maintain weight.  Continue to monitor. Check CMP/GFR/Microablumin  Left shoulder pain Steroid injection administered - tolerated well RICE Method If not improvement refer to Orthopedics further review and evaluation  Orders Placed This Encounter  Procedures   CBC with Differential/Platelet   COMPLETE METABOLIC PANEL WITH GFR   Lipid panel   Hemoglobin A1c   VITAMIN D 25 Hydroxy (Vit-D Deficiency, Fractures)   TSH   Notify office for further evaluation and treatment, questions or concerns if any reported s/s fail to improve.   The patient was advised to call back or seek an in-person evaluation if any symptoms worsen or if the condition fails to improve as anticipated.   Further disposition pending results of labs. Discussed med's effects and SE's.   I discussed the assessment and treatment plan with the patient. The patient was provided an opportunity to ask questions and all were answered. The patient agreed with the plan and demonstrated an understanding of the instructions.  Discussed med's effects and SE's. Screening labs and tests  as requested with regular follow-up as recommended.  I provided 40 minutes of face-to-face time during  this encounter including counseling, chart review, and critical decision making was preformed.  Future Appointments  Date Time Provider Department Center  01/11/2023 11:00 AM [name], MD [facility] None  05/15/2023 11:00 AM Adela Glimpse, NP [facility] None    Plan:   During the course of the visit the patient was educated and counseled about appropriate screening and preventive services including:   Pneumococcal vaccine  Influenza vaccine Prevnar 13 Td vaccine Screening electrocardiogram Colorectal cancer screening Diabetes screening Glaucoma screening Nutrition counseling    Subjective:  Jesus Bush is a 77 y.o. male who presents for Medicare Annual Wellness Visit and 3 month follow up.  Overall he reports feeling well today.    He has recently returned from a cruise.  During this time he reports that he stopped smoking cigarettes.  Quit smoking 05/30/22.  Last CT Scan 04/2021 revealed no suspicious pulmonary nodule or mass in the left lung base. Additional pulmonary nodules are stable from December 2021 lung cancer screening exam.  He continues to show emphysema with prominent central bronchial thickening.  He sues inhalers PRN.  Denies any new SOB.    He c/o left shoulder pain.  No recent injury.  Feels more frozen at times but has FROM with pain.  Unable to sleep on that arm at night without pain.  Has intermittent throbbing pain during the day.  Reports having steroid joint injection in the past that was effective.  He had a swallow evaluation completed  09/2021 for reports of dysphagia.  Normal study.  Incidental finding of a small to moderate-sized hiatal hernia. Patulous esophagus with intraluminal fluid to the level of the thoracic inlet, can be seen with reflux.  He had a referral placed to GI 08/2021 but did not follow through.  He continues to have increase  GERD symptoms.  He has purchased an incline bed but this helps.  States he has to sleep with the bed sitting at a 90 degree angle to feel any relief.  Pain is worse when lying down.  He is currently treated with Pantoprazole 40 mg and Famotidine 20 mg for break through but this does not help.  He has self medicated and increased the dose over time but this does not help.    2023 CT scan also continues to reveal aortic atherosclerosis.  Coronary artery calcifications.  He is continuing statin.   BMI is Body mass index is 24.13 kg/m., he has been working on diet and exercise.   Wt Readings from Last 3 Encounters:  07/18/22 165 lb 12.8 oz (75.2 kg)  06/01/22 160 lb (72.6 kg)  04/10/22 162 lb (73.5 kg)   His blood pressure has been controlled at home, today their BP is BP: 110/68 He does not workout. He denies chest pain, shortness of breath, dizziness.   His blood pressure has been controlled at home, today their BP is BP: 110/68  He does workout. He denies chest pain, shortness of breath, dizziness.   He is on cholesterol medication (rosuvastin 40 mg daily) and denies myalgias. His cholesterol is at goal. The cholesterol last visit was:   Lab Results  Component Value Date   CHOL 157 04/10/2022   HDL 37 (L) 04/10/2022   LDLCALC 94 04/10/2022   TRIG 160 (H) 04/10/2022   CHOLHDL 4.2 04/10/2022    He has been working on diet and exercise for prediabetes, and denies polydipsia, polyuria, visual disturbances, vomiting, and weight loss. Last A1C in the office was:  Lab Results  Component Value Date   HGBA1C 6.4 (H) 04/10/2022   Patient is on Vitamin D supplement.   Lab Results  Component Value Date   VD25OH 62 04/10/2022     He is not on thyroid medication but has been mildly hyperthyroid, monitoring. Lab Results  Component Value Date   TSH 0.43 04/10/2022  .   Medication Review: Current Outpatient Medications on File Prior to Visit  Medication Sig Dispense Refill   ACCU-CHEK  SOFTCLIX LANCETS lancets Check blood sugar 1 time daily-DX-R73.09 100 each 3   albuterol (VENTOLIN HFA) 108 (90 Base) MCG/ACT inhaler 2 inhalations 15 minutes apart every 4 hours to Rescue Asthma 48 g 3   Alcohol Swabs (B-D SINGLE USE SWABS REGULAR) PADS Check blood sugar 1 time daily-DX-R73.09. 100 each 3   aspirin 81 MG tablet Take 81 mg by mouth daily.     azithromycin (ZITHROMAX) 500 MG tablet Take 1 tablet (500 mg) daily for 10 days. 10 tablet 0   benzonatate (TESSALON PERLES) 100 MG capsule Take 1 capsule (100 mg total) by mouth 3 (three) times daily as needed for cough. 30 capsule 1   Blood Glucose Calibration (ACCU-CHEK AVIVA) SOLN Use per box instruction. 1 each 3   blood glucose meter kit and supplies KIT Test blood sugar daily or as directed 1 each 0   Blood Glucose Monitoring Suppl (ACCU-CHEK AVIVA PLUS) w/Device KIT Check blood sugar 1 time daily-DX-R73.09 1 kit 0   cetirizine (ZYRTEC ALLERGY) 10 MG tablet Take one tablet nightly for allergies. 90 tablet 1   Cholecalciferol (VITAMIN D) 2000 UNITS CAPS Take 2 capsules by mouth daily.      diltiazem (DILACOR XR) 240 MG 24 hr capsule TAKE 1 CAPSULE DAILY FOR BLOOD PRESSURE AND HEART RHYTHM 90 capsule 3   famotidine (PEPCID) 40 MG tablet Take 1 tablet (40 mg total) by mouth 2 (two) times daily. 90 tablet 3   fluticasone (FLONASE) 50 MCG/ACT nasal spray 2 sprays each nostril daily 16 g 2   Fluticasone-Umeclidin-Vilant (TRELEGY ELLIPTA) 200-62.5-25 MCG/ACT AEPB Use  1 Inhalation  Daily  for COPD /Asthma 180 each 3   glucose blood (ACCU-CHEK AVIVA PLUS) test strip Check blood sugar 1 time daily-R73.09. 100 each 3   ipratropium-albuterol (DUONEB) 0.5-2.5 (3) MG/3ML SOLN USE 1 VIAL IN NEBULIZER EVERY 4 TO 6 HOURS - As Needed 180 mL 11   losartan (COZAAR) 100 MG tablet TAKE 1 TABLET EVERY DAY FOR BLOOD PRESSURE 90 tablet 3   magnesium gluconate (MAGONATE) 500 MG tablet Take 500 mg by mouth daily.      metoCLOPramide (REGLAN) 10 MG tablet Take   1 tablet  3 x/ day  with Lunch, Supper & Bedtime for Acid Indigestion & Reflux. 90 tablet 1   montelukast (SINGULAIR) 10 MG tablet Take 1 tablet (10 mg total) by mouth daily. 30 tablet 2   pantoprazole (PROTONIX) 40 MG tablet Take  1 tablet  2 x /day  to Prevent Indigestion & Heartburn 180 tablet 3   promethazine-dextromethorphan (PROMETHAZINE-DM) 6.25-15 MG/5ML syrup Take 5 mLs by mouth 4 (four) times daily as needed for cough. 240 mL 1   rosuvastatin (CRESTOR) 40 MG tablet TAKE 1 TABLET DAILY FOR CHOLESTEROL 90 tablet 3   zinc gluconate 50 MG tablet Take 50 mg by mouth daily.     No current facility-administered medications on file prior to visit.    Current Problems (verified) Patient Active Problem List   Diagnosis Date Noted   Coronary atherosclerosis 09/17/2020  Sebaceous cyst 09/16/2020   Nodule of left lung 02/05/2019   Hyperthyroidism 06/28/2018   Aortic atherosclerosis 09/09/2017   COPD (chronic obstructive pulmonary disease) 11/08/2016   Esophageal reflux 04/25/2015   Former smoker (44 pack year hx, quit 2021) 07/23/2014   Medication management 04/13/2014   Essential hypertension 02/11/2013   Mixed hyperlipidemia 02/11/2013   Vitamin D deficiency 02/11/2013   Abnormal glucose (prediabetes) 05/12/2012    Screening Tests Immunization History  Administered Date(s) Administered   DTaP 05/23/2006   Influenza Whole 12/16/2012   Influenza, High Dose Seasonal PF 01/17/2015, 12/13/2015, 01/29/2017, 12/17/2017, 01/14/2019, 01/27/2020, 01/24/2021, 01/08/2022   Influenza-Unspecified 12/27/2013   PFIZER(Purple Top)SARS-COV-2 Vaccination 05/18/2019, 06/08/2019   Pneumococcal Conjugate-13 04/13/2014   Pneumococcal Polysaccharide-23 09/13/2008, 08/06/2016   Td 08/06/2016   Zoster, Live 04/17/2013    Patient Care Team: [name], MD as PCP - General (Internal Medicine) [name], MD (Internal Medicine) Louis Meckel, MD (Inactive) as Consulting Physician  (Gastroenterology)  Allergies Allergies  Allergen Reactions   Ppd [Tuberculin Purified Protein Derivative]     Positive PPD.    SURGICAL HISTORY He  has a past surgical history that includes Cataract extraction, bilateral (Bilateral, 2018). FAMILY HISTORY His family history includes Diabetes in his sister; Heart attack in his brother, father, and mother; Heart disease in his father, mother, and sister; Hypertension in his brother. SOCIAL HISTORY He  reports that he quit smoking about 7 weeks ago. His smoking use included cigarettes. He started smoking about 37 years ago. He has a 44.00 pack-year smoking history. He has never used smokeless tobacco. He reports that he does not drink alcohol and does not use drugs.   Objective:   Today's Vitals   07/18/22 1532  BP: 110/68  Pulse: 68  Temp: 97.6 F (36.4 C)  SpO2: 98%  Weight: 165 lb 12.8 oz (75.2 kg)  Height: 5' 9.5" (1.765 m)     Body mass index is 24.13 kg/m.  General Appearance: Well nourished, in no apparent distress. Eyes: PERRLA, EOMs, conjunctiva no swelling or erythema Sinuses: No Frontal/maxillary tenderness ENT/Mouth: Ext aud canals clear, TMs without erythema, bulging. No erythema, swelling, or exudate on post pharynx.  Tonsils not swollen or erythematous. Hearing normal.  Neck: Supple, thyroid normal.  Respiratory: Respiratory effort normal, BS diminished throughout, limited expansion without rales, he does have scattered rhonchi, without wheezing or stridor.  Cardio: RRR with no MRGs. Brisk peripheral pulses without edema.  Abdomen: Soft, + BS.  Non tender, no guarding, rebound, hernias, masses. Lymphatics: Non tender without lymphadenopathy.  Musculoskeletal: Full ROM, 5/5 strength, Normal gait Skin: Warm, dry without rashes, lesions, ecchymosis. He has lump approx 2-3 cm area with some fluctuance and rubbery texture, smooth, mobile to left upper back.  Neuro: Cranial nerves intact. No cerebellar symptoms.   Psych: Awake and oriented X 3, normal affect, Insight and Judgment appropriate.     Adela Glimpse, NP   07/16/2019

## 2022-07-18 NOTE — Patient Instructions (Signed)

## 2022-07-19 LAB — CBC WITH DIFFERENTIAL/PLATELET
Basophils Absolute: 78 cells/uL (ref 0–200)
Basophils Relative: 0.9 %
Eosinophils Absolute: 122 cells/uL (ref 15–500)
Eosinophils Relative: 1.4 %
HCT: 41.5 % (ref 38.5–50.0)
Hemoglobin: 13.8 g/dL (ref 13.2–17.1)
MCHC: 33.3 g/dL (ref 32.0–36.0)
MPV: 11.4 fL (ref 7.5–12.5)
Neutro Abs: 5490 cells/uL (ref 1500–7800)
Platelets: 235 10*3/uL (ref 140–400)
RDW: 12.4 % (ref 11.0–15.0)
Total Lymphocyte: 23.2 %
WBC: 8.7 10*3/uL (ref 3.8–10.8)

## 2022-07-19 LAB — HEMOGLOBIN A1C
Hgb A1c MFr Bld: 6.6 % of total Hgb — ABNORMAL HIGH (ref <5.7)
Mean Plasma Glucose: 143 mg/dL
eAG (mmol/L): 7.9 mmol/L

## 2022-07-19 LAB — LIPID PANEL
Cholesterol: 151 mg/dL (ref <200)
HDL: 41 mg/dL (ref >=40)
LDL Cholesterol (Calc): 77 mg/dL (calc)
Non-HDL Cholesterol (Calc): 110 mg/dL (calc) (ref <130)
Total CHOL/HDL Ratio: 3.7 (calc) (ref <5.0)
Triglycerides: 240 mg/dL — ABNORMAL HIGH (ref <150)

## 2022-07-19 LAB — COMPLETE METABOLIC PANEL WITH GFR
AG Ratio: 1.8 (calc) (ref 1.0–2.5)
ALT: 32 U/L (ref 9–46)
AST: 26 U/L (ref 10–35)
Albumin: 4.2 g/dL (ref 3.6–5.1)
Alkaline phosphatase (APISO): 83 U/L (ref 35–144)
BUN: 13 mg/dL (ref 7–25)
CO2: 27 mmol/L (ref 20–32)
Calcium: 9.8 mg/dL (ref 8.6–10.3)
Chloride: 104 mmol/L (ref 98–110)
Creat: 1.26 mg/dL (ref 0.70–1.28)
Globulin: 2.4 g/dL (calc) (ref 1.9–3.7)
Glucose, Bld: 74 mg/dL (ref 65–99)
Potassium: 4.5 mmol/L (ref 3.5–5.3)
Sodium: 142 mmol/L (ref 135–146)
Total Bilirubin: 0.4 mg/dL (ref 0.2–1.2)
Total Protein: 6.6 g/dL (ref 6.1–8.1)
eGFR: 59 mL/min/{1.73_m2} — ABNORMAL LOW (ref >=60)

## 2022-07-19 LAB — TSH: TSH: 0.77 mIU/L (ref 0.40–4.50)

## 2022-07-19 LAB — VITAMIN D 25 HYDROXY (VIT D DEFICIENCY, FRACTURES): Vit D, 25-Hydroxy: 65 ng/mL (ref 30–100)

## 2022-07-23 ENCOUNTER — Encounter: Payer: Self-pay | Admitting: Gastroenterology

## 2022-08-09 ENCOUNTER — Ambulatory Visit: Payer: Medicare HMO | Admitting: Nurse Practitioner

## 2022-08-22 ENCOUNTER — Emergency Department (HOSPITAL_COMMUNITY): Payer: Medicare HMO

## 2022-08-22 ENCOUNTER — Inpatient Hospital Stay (HOSPITAL_COMMUNITY)
Admission: EM | Admit: 2022-08-22 | Discharge: 2022-08-31 | DRG: 164 | Disposition: A | Discharge status: Home / Self Care | Payer: Medicare HMO | Attending: Internal Medicine | Admitting: Internal Medicine

## 2022-08-22 ENCOUNTER — Other Ambulatory Visit: Payer: Self-pay

## 2022-08-22 DIAGNOSIS — J479 Bronchiectasis, uncomplicated: Secondary | ICD-10-CM | POA: Diagnosis present

## 2022-08-22 DIAGNOSIS — J449 Chronic obstructive pulmonary disease, unspecified: Secondary | ICD-10-CM | POA: Diagnosis present

## 2022-08-22 DIAGNOSIS — I700 Atherosclerosis of aorta: Secondary | ICD-10-CM | POA: Diagnosis not present

## 2022-08-22 DIAGNOSIS — Z8249 Family history of ischemic heart disease and other diseases of the circulatory system: Secondary | ICD-10-CM

## 2022-08-22 DIAGNOSIS — J439 Emphysema, unspecified: Secondary | ICD-10-CM | POA: Diagnosis not present

## 2022-08-22 DIAGNOSIS — J90 Pleural effusion, not elsewhere classified: Secondary | ICD-10-CM | POA: Diagnosis not present

## 2022-08-22 DIAGNOSIS — Z79899 Other long term (current) drug therapy: Secondary | ICD-10-CM

## 2022-08-22 DIAGNOSIS — R140 Abdominal distension (gaseous): Secondary | ICD-10-CM | POA: Diagnosis not present

## 2022-08-22 DIAGNOSIS — K219 Gastro-esophageal reflux disease without esophagitis: Secondary | ICD-10-CM | POA: Diagnosis present

## 2022-08-22 DIAGNOSIS — E0590 Thyrotoxicosis, unspecified without thyrotoxic crisis or storm: Secondary | ICD-10-CM | POA: Diagnosis not present

## 2022-08-22 DIAGNOSIS — J9311 Primary spontaneous pneumothorax: Secondary | ICD-10-CM | POA: Diagnosis not present

## 2022-08-22 DIAGNOSIS — E119 Type 2 diabetes mellitus without complications: Secondary | ICD-10-CM | POA: Diagnosis present

## 2022-08-22 DIAGNOSIS — I4892 Unspecified atrial flutter: Secondary | ICD-10-CM | POA: Diagnosis not present

## 2022-08-22 DIAGNOSIS — Z4682 Encounter for fitting and adjustment of non-vascular catheter: Secondary | ICD-10-CM | POA: Diagnosis not present

## 2022-08-22 DIAGNOSIS — K5730 Diverticulosis of large intestine without perforation or abscess without bleeding: Secondary | ICD-10-CM | POA: Diagnosis not present

## 2022-08-22 DIAGNOSIS — R008 Other abnormalities of heart beat: Secondary | ICD-10-CM | POA: Diagnosis not present

## 2022-08-22 DIAGNOSIS — J939 Pneumothorax, unspecified: Secondary | ICD-10-CM | POA: Diagnosis present

## 2022-08-22 DIAGNOSIS — I480 Paroxysmal atrial fibrillation: Secondary | ICD-10-CM | POA: Diagnosis not present

## 2022-08-22 DIAGNOSIS — R918 Other nonspecific abnormal finding of lung field: Secondary | ICD-10-CM | POA: Diagnosis not present

## 2022-08-22 DIAGNOSIS — Z833 Family history of diabetes mellitus: Secondary | ICD-10-CM | POA: Diagnosis not present

## 2022-08-22 DIAGNOSIS — J9312 Secondary spontaneous pneumothorax: Secondary | ICD-10-CM | POA: Diagnosis not present

## 2022-08-22 DIAGNOSIS — R0602 Shortness of breath: Secondary | ICD-10-CM | POA: Diagnosis not present

## 2022-08-22 DIAGNOSIS — Z887 Allergy status to serum and vaccine status: Secondary | ICD-10-CM | POA: Diagnosis not present

## 2022-08-22 DIAGNOSIS — J4489 Other specified chronic obstructive pulmonary disease: Secondary | ICD-10-CM | POA: Diagnosis present

## 2022-08-22 DIAGNOSIS — R911 Solitary pulmonary nodule: Secondary | ICD-10-CM | POA: Diagnosis present

## 2022-08-22 DIAGNOSIS — Z7982 Long term (current) use of aspirin: Secondary | ICD-10-CM

## 2022-08-22 DIAGNOSIS — Z1152 Encounter for screening for COVID-19: Secondary | ICD-10-CM | POA: Diagnosis not present

## 2022-08-22 DIAGNOSIS — F17200 Nicotine dependence, unspecified, uncomplicated: Secondary | ICD-10-CM | POA: Diagnosis not present

## 2022-08-22 DIAGNOSIS — Z87891 Personal history of nicotine dependence: Secondary | ICD-10-CM | POA: Diagnosis not present

## 2022-08-22 DIAGNOSIS — J9811 Atelectasis: Secondary | ICD-10-CM | POA: Diagnosis not present

## 2022-08-22 DIAGNOSIS — I10 Essential (primary) hypertension: Secondary | ICD-10-CM | POA: Diagnosis not present

## 2022-08-22 DIAGNOSIS — J9383 Other pneumothorax: Secondary | ICD-10-CM | POA: Diagnosis not present

## 2022-08-22 DIAGNOSIS — E782 Mixed hyperlipidemia: Secondary | ICD-10-CM | POA: Diagnosis present

## 2022-08-22 DIAGNOSIS — Z9842 Cataract extraction status, left eye: Secondary | ICD-10-CM | POA: Diagnosis not present

## 2022-08-22 DIAGNOSIS — R0902 Hypoxemia: Secondary | ICD-10-CM | POA: Diagnosis not present

## 2022-08-22 DIAGNOSIS — K5900 Constipation, unspecified: Secondary | ICD-10-CM | POA: Diagnosis present

## 2022-08-22 DIAGNOSIS — J438 Other emphysema: Secondary | ICD-10-CM | POA: Diagnosis not present

## 2022-08-22 DIAGNOSIS — I2510 Atherosclerotic heart disease of native coronary artery without angina pectoris: Secondary | ICD-10-CM | POA: Diagnosis not present

## 2022-08-22 DIAGNOSIS — R7309 Other abnormal glucose: Secondary | ICD-10-CM | POA: Diagnosis present

## 2022-08-22 DIAGNOSIS — J984 Other disorders of lung: Secondary | ICD-10-CM | POA: Diagnosis not present

## 2022-08-22 DIAGNOSIS — I483 Typical atrial flutter: Secondary | ICD-10-CM | POA: Diagnosis not present

## 2022-08-22 DIAGNOSIS — Z9841 Cataract extraction status, right eye: Secondary | ICD-10-CM

## 2022-08-22 DIAGNOSIS — R1013 Epigastric pain: Secondary | ICD-10-CM | POA: Diagnosis not present

## 2022-08-22 LAB — COMPREHENSIVE METABOLIC PANEL
ALT: 42 U/L (ref 0–44)
AST: 43 U/L — ABNORMAL HIGH (ref 15–41)
Albumin: 3.8 g/dL (ref 3.5–5.0)
Alkaline Phosphatase: 81 U/L (ref 38–126)
Anion gap: 11 (ref 5–15)
BUN: 11 mg/dL (ref 8–23)
CO2: 26 mmol/L (ref 22–32)
Calcium: 9.7 mg/dL (ref 8.9–10.3)
Chloride: 102 mmol/L (ref 98–111)
Creatinine, Ser: 1.31 mg/dL — ABNORMAL HIGH (ref 0.61–1.24)
GFR, Estimated: 56 mL/min — ABNORMAL LOW (ref >60)
Glucose, Bld: 147 mg/dL — ABNORMAL HIGH (ref 70–99)
Potassium: 4 mmol/L (ref 3.5–5.1)
Sodium: 139 mmol/L (ref 135–145)
Total Bilirubin: 0.5 mg/dL (ref 0.3–1.2)
Total Protein: 6.9 g/dL (ref 6.5–8.1)

## 2022-08-22 LAB — CBC WITH DIFFERENTIAL/PLATELET
Abs Immature Granulocytes: 0.04 10*3/uL (ref 0.00–0.07)
Basophils Absolute: 0.1 10*3/uL (ref 0.0–0.1)
Basophils Relative: 1 %
Eosinophils Absolute: 0.1 10*3/uL (ref 0.0–0.5)
Eosinophils Relative: 1 %
HCT: 44.8 % (ref 39.0–52.0)
Hemoglobin: 14.8 g/dL (ref 13.0–17.0)
Immature Granulocytes: 0 %
Lymphocytes Relative: 18 %
Lymphs Abs: 1.8 10*3/uL (ref 0.7–4.0)
MCH: 31.3 pg (ref 26.0–34.0)
MCHC: 33 g/dL (ref 30.0–36.0)
MCV: 94.7 fL (ref 80.0–100.0)
Monocytes Absolute: 0.8 10*3/uL (ref 0.1–1.0)
Monocytes Relative: 8 %
Neutro Abs: 7.6 10*3/uL (ref 1.7–7.7)
Neutrophils Relative %: 72 %
Platelets: 252 10*3/uL (ref 150–400)
RBC: 4.73 MIL/uL (ref 4.22–5.81)
RDW: 12.8 % (ref 11.5–15.5)
WBC: 10.5 10*3/uL (ref 4.0–10.5)
nRBC: 0 % (ref 0.0–0.2)

## 2022-08-22 LAB — TROPONIN I (HIGH SENSITIVITY)
Troponin I (High Sensitivity): 9 ng/L (ref <18)
Troponin I (High Sensitivity): 8 ng/L (ref <18)

## 2022-08-22 LAB — GLUCOSE, CAPILLARY: Glucose-Capillary: 116 mg/dL — ABNORMAL HIGH (ref 70–99)

## 2022-08-22 LAB — LACTIC ACID, PLASMA: Lactic Acid, Venous: 1.7 mmol/L (ref 0.5–1.9)

## 2022-08-22 LAB — LIPASE, BLOOD: Lipase: 44 U/L (ref 11–51)

## 2022-08-22 MED ORDER — DILTIAZEM HCL ER 240 MG PO CP24: 240.0000 mg | ORAL_CAPSULE | Freq: Every day | ORAL | Status: DC

## 2022-08-22 MED ORDER — PANTOPRAZOLE SODIUM 20 MG PO TBEC
20.0000 mg | DELAYED_RELEASE_TABLET | Freq: Every day | ORAL | Status: DC
  Administered 2022-08-23 – 2022-08-24 (×2): 20 mg via ORAL
  Filled 2022-08-22 (×4): qty 1

## 2022-08-22 MED ORDER — OXYCODONE HCL 5 MG PO TABS
5.0000 mg | ORAL_TABLET | ORAL | Status: DC | PRN
  Administered 2022-08-23 – 2022-08-30 (×22): 5 mg via ORAL
  Filled 2022-08-22 (×21): qty 1

## 2022-08-22 MED ORDER — ONDANSETRON HCL 4 MG/2ML IJ SOLN
4.0000 mg | Freq: Four times a day (QID) | INTRAMUSCULAR | Status: DC | PRN
  Administered 2022-08-22: 4 mg via INTRAVENOUS
  Filled 2022-08-22: qty 2

## 2022-08-22 MED ORDER — IPRATROPIUM-ALBUTEROL 0.5-2.5 (3) MG/3ML IN SOLN: 3.0000 mL | Freq: Four times a day (QID) | RESPIRATORY_TRACT | Status: DC

## 2022-08-22 MED ORDER — ALBUTEROL SULFATE (2.5 MG/3ML) 0.083% IN NEBU
2.5000 mg | INHALATION_SOLUTION | RESPIRATORY_TRACT | Status: DC | PRN
  Administered 2022-08-22 – 2022-08-24 (×2): 2.5 mg via RESPIRATORY_TRACT
  Filled 2022-08-22 (×2): qty 3

## 2022-08-22 MED ORDER — ROSUVASTATIN CALCIUM 20 MG PO TABS
40.0000 mg | ORAL_TABLET | Freq: Every day | ORAL | Status: DC
  Administered 2022-08-23 – 2022-08-31 (×8): 40 mg via ORAL
  Filled 2022-08-22 (×8): qty 2

## 2022-08-22 MED ORDER — IPRATROPIUM-ALBUTEROL 0.5-2.5 (3) MG/3ML IN SOLN: 3.0000 mL | Freq: Four times a day (QID) | RESPIRATORY_TRACT | Status: DC | PRN

## 2022-08-22 MED ORDER — LOSARTAN POTASSIUM 50 MG PO TABS
100.0000 mg | ORAL_TABLET | Freq: Every day | ORAL | Status: DC
  Filled 2022-08-22: qty 2

## 2022-08-22 MED ORDER — ONDANSETRON HCL 4 MG PO TABS: 4.0000 mg | ORAL_TABLET | Freq: Four times a day (QID) | ORAL | Status: DC | PRN

## 2022-08-22 MED ORDER — IOHEXOL 350 MG/ML SOLN
75.0000 mL | Freq: Once | INTRAVENOUS | Status: AC | PRN
  Administered 2022-08-22: 75 mL via INTRAVENOUS

## 2022-08-22 MED ORDER — SODIUM CHLORIDE 0.9% FLUSH
10.0000 mL | Freq: Three times a day (TID) | INTRAVENOUS | Status: DC
  Administered 2022-08-23 – 2022-08-29 (×16): 10 mL via INTRAPLEURAL

## 2022-08-22 MED ORDER — HEPARIN SODIUM (PORCINE) 5000 UNIT/ML IJ SOLN
5000.0000 [IU] | Freq: Three times a day (TID) | INTRAMUSCULAR | Status: DC
  Administered 2022-08-23 – 2022-08-25 (×8): 5000 [IU] via SUBCUTANEOUS
  Filled 2022-08-22 (×7): qty 1

## 2022-08-22 MED ORDER — HYDRALAZINE HCL 20 MG/ML IJ SOLN: 5.0000 mg | INTRAMUSCULAR | Status: DC | PRN

## 2022-08-22 MED ORDER — LACTATED RINGERS IV BOLUS
500.0000 mL | Freq: Once | INTRAVENOUS | Status: AC
  Administered 2022-08-22: 500 mL via INTRAVENOUS

## 2022-08-22 MED ORDER — ALUM & MAG HYDROXIDE-SIMETH 200-200-20 MG/5ML PO SUSP
30.0000 mL | ORAL | Status: DC | PRN
  Administered 2022-08-22 – 2022-08-30 (×5): 30 mL via ORAL
  Filled 2022-08-22 (×5): qty 30

## 2022-08-22 MED ORDER — NICOTINE 21 MG/24HR TD PT24
21.0000 mg | MEDICATED_PATCH | Freq: Every day | TRANSDERMAL | Status: DC
  Administered 2022-08-22 – 2022-08-31 (×9): 21 mg via TRANSDERMAL
  Filled 2022-08-22 (×9): qty 1

## 2022-08-22 MED ORDER — UMECLIDINIUM BROMIDE 62.5 MCG/ACT IN AEPB: 1.0000 | INHALATION_SPRAY | Freq: Every day | RESPIRATORY_TRACT | Status: DC

## 2022-08-22 MED ORDER — ACETAMINOPHEN 650 MG RE SUPP: 650.0000 mg | Freq: Four times a day (QID) | RECTAL | Status: DC | PRN

## 2022-08-22 MED ORDER — MORPHINE SULFATE (PF) 2 MG/ML IV SOLN
2.0000 mg | INTRAVENOUS | Status: DC | PRN
  Administered 2022-08-22 – 2022-08-29 (×16): 2 mg via INTRAVENOUS
  Filled 2022-08-22 (×16): qty 1

## 2022-08-22 MED ORDER — ALBUTEROL SULFATE HFA 108 (90 BASE) MCG/ACT IN AERS: 2.0000 | INHALATION_SPRAY | Freq: Four times a day (QID) | RESPIRATORY_TRACT | Status: DC | PRN

## 2022-08-22 MED ORDER — UMECLIDINIUM BROMIDE 62.5 MCG/ACT IN AEPB
1.0000 | INHALATION_SPRAY | Freq: Every day | RESPIRATORY_TRACT | Status: DC
  Administered 2022-08-23 – 2022-08-31 (×8): 1 via RESPIRATORY_TRACT
  Filled 2022-08-22 (×2): qty 7

## 2022-08-22 MED ORDER — LIDOCAINE-EPINEPHRINE (PF) 2 %-1:200000 IJ SOLN
10.0000 mL | Freq: Once | INTRAMUSCULAR | Status: AC
  Administered 2022-08-22: 10 mL via INTRADERMAL
  Filled 2022-08-22: qty 20

## 2022-08-22 MED ORDER — MONTELUKAST SODIUM 10 MG PO TABS
10.0000 mg | ORAL_TABLET | Freq: Every day | ORAL | Status: DC
  Administered 2022-08-23 – 2022-08-31 (×8): 10 mg via ORAL
  Filled 2022-08-22 (×9): qty 1

## 2022-08-22 MED ORDER — IPRATROPIUM-ALBUTEROL 0.5-2.5 (3) MG/3ML IN SOLN
3.0000 mL | Freq: Once | RESPIRATORY_TRACT | Status: AC
  Administered 2022-08-22: 3 mL via RESPIRATORY_TRACT
  Filled 2022-08-22: qty 3

## 2022-08-22 MED ORDER — DILTIAZEM HCL ER COATED BEADS 240 MG PO CP24
240.0000 mg | ORAL_CAPSULE | Freq: Every day | ORAL | Status: DC
  Administered 2022-08-22 – 2022-08-23 (×2): 240 mg via ORAL
  Filled 2022-08-22 (×3): qty 1

## 2022-08-22 MED ORDER — FLUTICASONE FUROATE-VILANTEROL 200-25 MCG/ACT IN AEPB: 1.0000 | INHALATION_SPRAY | Freq: Every day | RESPIRATORY_TRACT | Status: DC

## 2022-08-22 MED ORDER — FENTANYL CITRATE PF 50 MCG/ML IJ SOSY
50.0000 ug | PREFILLED_SYRINGE | INTRAMUSCULAR | Status: DC | PRN
  Administered 2022-08-22 – 2022-08-23 (×4): 50 ug via INTRAVENOUS
  Filled 2022-08-22 (×4): qty 1

## 2022-08-22 MED ORDER — ACETAMINOPHEN 325 MG PO TABS: 650.0000 mg | ORAL_TABLET | Freq: Four times a day (QID) | ORAL | Status: DC | PRN

## 2022-08-22 MED ORDER — FLUTICASONE FUROATE-VILANTEROL 200-25 MCG/ACT IN AEPB
1.0000 | INHALATION_SPRAY | Freq: Every day | RESPIRATORY_TRACT | Status: DC
  Administered 2022-08-23 – 2022-08-31 (×9): 1 via RESPIRATORY_TRACT
  Filled 2022-08-22 (×2): qty 28

## 2022-08-22 NOTE — Consult Note (Signed)
NAME:  Jesus Bush, MRN:  [id number], DOB:  02/15/1946, LOS: 0 ADMISSION DATE:  08/22/2022, CONSULTATION DATE:  08/22/22 REFERRING MD:  Durwin Nora CHIEF COMPLAINT:  Abd pain   History of Present Illness:  Jesus Bush is a 77 y.o. male who has a PMH as below including COPD per report (no formal diagnosis), HTN, HLD, DM. He presented to [facility] ED 5/29 with epigastric pain that began around 0400 that morning. Pain resolved and he ate breakfast fine; however, later developed similar pain with radiation into upper abdomen and lower chest and also with associated dyspnea.  He came to ED where he had CT abd/pelv and CTA chest that revealed a large almost complete left sided pneumothorax.  He had chest tube placed by EDP and follow up CXR shows near resolution/reexpansion of left lung; though, it appears there is still an apical PTX at this point.  Hospitalists were called for admission and PCCM called for chest tube management.  He denies any similar episodes in the past. He says that he has chronic cough and does not feel he had more coughing or aggressive coughing over the past few days. Denies any trauma. Pain much improved after chest tube placement. Still has mild cough.   Pertinent  Medical History:  has Abnormal glucose (prediabetes); Essential hypertension; Mixed hyperlipidemia; Vitamin D deficiency; Medication management; Former smoker (44 pack year hx, quit 2021); Esophageal reflux; COPD (chronic obstructive pulmonary disease) (HCC); Aortic atherosclerosis (HCC); Hyperthyroidism; Nodule of left lung; Sebaceous cyst; Coronary atherosclerosis; and Pneumothorax on their problem list.  Significant Hospital Events: Including procedures, antibiotic start and stop dates in addition to other pertinent events   5/29 admit  Interim History / Subjective:  Comfortable. Pain improved after CT placement. Sats 95-97% on room air.  Objective:  Blood pressure 127/71, pulse 64, temperature 98 F (36.7  C), resp. rate (!) 23, SpO2 95 %.       No intake or output data in the 24 hours ending 08/22/22 1619 There were no vitals filed for this visit.  Examination: General: Adult male, sitting up in stretcher, in NAD. Neuro: A&O x 3, no deficits. HEENT: [place]/AT. Sclerae anicteric. EOMI. Cardiovascular: RRR, no M/R/G.  Lungs: Respirations even and unlabored.  Slightly diminished on left but clear. L chest tube in place with small air leak present. Abdomen: BS x 4, soft, NT/ND.  Musculoskeletal: No gross deformities, no edema.  Skin: Intact, warm, no rashes.  Labs/imaging personally reviewed:  CTA chest 5/29 > large L PTX. No PE.  CT abd/pelv 5/29 > neg.  Assessment & Plan:   Spontaneous left sided pneumothorax - with his smoking hx and reported hx of COPD (no formal diagnosis), this could likely be 2/2 ruptured bleb but unclear at this point. He denies any trauma or forceful coughing etc. He is s/p chest tube placement in ED with near complete resolution/reexpansion of left lung with exception of possible residual apical PTX. - Maintain chest tube to 20cm suction until air leak resolves. - Routine chest tube nursing flushes/management. - Repeat CXR in AM. - Clamp chest tube once air leak absent and CXR stable then repeat CXR again in 6 - 8 hours. - Pt instructed to notify RN if has any sudden worsening of pain/symptoms.  Tobacco dependence. - Tobacco cessation counseling.  COPD per report - no formal diagnosis. - Continue home Albuterol, Duonebs. - Breo + Incruse in lieu of home Trelegy.   Rest per primary team. PCCM will follow for chest  tube management.  Best practice (evaluated daily):  Per primary team.  Labs   CBC: Recent Labs  Lab 08/22/22 1248  WBC 10.5  NEUTROABS 7.6  HGB 14.8  HCT 44.8  MCV 94.7  PLT 252    Basic Metabolic Panel: Recent Labs  Lab 08/22/22 1248  NA 139  K 4.0  CL 102  CO2 26  GLUCOSE 147*  BUN 11  CREATININE 1.31*  CALCIUM 9.7    GFR: CrCl cannot be calculated (Unknown ideal weight.). Recent Labs  Lab 08/22/22 1231 08/22/22 1248  WBC  --  10.5  LATICACIDVEN 1.7  --     Liver Function Tests: Recent Labs  Lab 08/22/22 1248  AST 43*  ALT 42  ALKPHOS 81  BILITOT 0.5  PROT 6.9  ALBUMIN 3.8   Recent Labs  Lab 08/22/22 1248  LIPASE 44   No results for input(s): "AMMONIA" in the last 168 hours.  ABG No results found for: "PHART", "PCO2ART", "PO2ART", "HCO3", "TCO2", "ACIDBASEDEF", "O2SAT"   Coagulation Profile: No results for input(s): "INR", "PROTIME" in the last 168 hours.  Cardiac Enzymes: No results for input(s): "CKTOTAL", "CKMB", "CKMBINDEX", "TROPONINI" in the last 168 hours.  HbA1C: Hgb A1c MFr Bld  Date/Time Value Ref Range Status  07/18/2022 04:42 PM 6.6 (H) <5.7 % of total Hgb Final    Comment:    For someone without known diabetes, a hemoglobin A1c value of 6.5% or greater indicates that they may have  diabetes and this should be confirmed with a follow-up  test. . For someone with known diabetes, a value <7% indicates  that their diabetes is well controlled and a value  greater than or equal to 7% indicates suboptimal  control. A1c targets should be individualized based on  duration of diabetes, age, comorbid conditions, and  other considerations. . Currently, no consensus exists regarding use of hemoglobin A1c for diagnosis of diabetes for children. Marland Kitchen   04/10/2022 12:00 AM 6.4 (H) <5.7 % of total Hgb Final    Comment:    For someone without known diabetes, a hemoglobin  A1c value between 5.7% and 6.4% is consistent with prediabetes and should be confirmed with a  follow-up test. . For someone with known diabetes, a value <7% indicates that their diabetes is well controlled. A1c targets should be individualized based on duration of diabetes, age, comorbid conditions, and other considerations. . This assay result is consistent with an increased risk of  diabetes. . Currently, no consensus exists regarding use of hemoglobin A1c for diagnosis of diabetes for children. .     CBG: No results for input(s): "GLUCAP" in the last 168 hours.  Review of Systems:   All negative; except for those that are bolded, which indicate positives.  Constitutional: weight loss, weight gain, night sweats, fevers, chills, fatigue, weakness.  HEENT: headaches, sore throat, sneezing, nasal congestion, post nasal drip, difficulty swallowing, tooth/dental problems, visual complaints, visual changes, ear aches. Neuro: difficulty with speech, weakness, numbness, ataxia. CV:  chest pain, orthopnea, PND, swelling in lower extremities, dizziness, palpitations, syncope.  Resp: cough, hemoptysis, dyspnea, wheezing. GI: heartburn, indigestion, abdominal pain, nausea, vomiting, diarrhea, constipation, change in bowel habits, loss of appetite, hematemesis, melena, hematochezia.  GU: dysuria, change in color of urine, urgency or frequency, flank pain, hematuria. MSK: joint pain or swelling, decreased range of motion. Psych: change in mood or affect, depression, anxiety, suicidal ideations, homicidal ideations. Skin: rash, itching, bruising.   Past Medical History:  He,  has a past  medical history of Diabetes mellitus without complication (HCC), Hyperlipidemia, Hypertension, and Vitamin D deficiency.   Surgical History:   Past Surgical History:  Procedure Laterality Date   CATARACT EXTRACTION, BILATERAL Bilateral 2018   Dr. Wayland Denis     Social History:   reports that he quit smoking about 2 months ago. His smoking use included cigarettes. He started smoking about 37 years ago. He has a 44.00 pack-year smoking history. He has never used smokeless tobacco. He reports that he does not drink alcohol and does not use drugs.   Family History:  His family history includes Diabetes in his sister; Heart attack in his brother, father, and mother; Heart disease in his  father, mother, and sister; Hypertension in his brother.   Allergies Allergies  Allergen Reactions   Ppd [Tuberculin Purified Protein Derivative]     Positive PPD.     Home Medications  Prior to Admission medications   Medication Sig Start Date End Date Taking? Authorizing Provider  ACCU-CHEK SOFTCLIX LANCETS lancets Check blood sugar 1 time daily-DX-R73.09 11/06/17   [name], MD  albuterol (VENTOLIN HFA) 108 (90 Base) MCG/ACT inhaler 2 inhalations 15 minutes apart every 4 hours to Rescue Asthma 07/11/21   Adela Glimpse, NP  Alcohol Swabs (B-D SINGLE USE SWABS REGULAR) PADS Check blood sugar 1 time daily-DX-R73.09. 11/06/17   [name], MD  aspirin 81 MG tablet Take 81 mg by mouth daily.    [provider]  Blood Glucose Calibration (ACCU-CHEK AVIVA) SOLN Use per box instruction. 11/06/17   [name], MD  blood glucose meter kit and supplies KIT Test blood sugar daily or as directed 09/18/21   Adela Glimpse, NP  Blood Glucose Monitoring Suppl (ACCU-CHEK AVIVA PLUS) w/Device KIT Check blood sugar 1 time daily-DX-R73.09 11/06/17   [name], MD  cetirizine (ZYRTEC ALLERGY) 10 MG tablet Take one tablet nightly for allergies. 07/16/19   Elder Negus, NP  Cholecalciferol (VITAMIN D) 2000 UNITS CAPS Take 2 capsules by mouth daily.     [provider]  diltiazem (DILACOR XR) 240 MG 24 hr capsule TAKE 1 CAPSULE DAILY FOR BLOOD PRESSURE AND HEART RHYTHM 08/16/21   Judd Gaudier, NP  famotidine (PEPCID) 40 MG tablet Take 1 tablet (40 mg total) by mouth 2 (two) times daily. 09/18/21   Adela Glimpse, NP  fluticasone (FLONASE) 50 MCG/ACT nasal spray 2 sprays each nostril daily 06/01/22   Raynelle Dick, NP  Fluticasone-Umeclidin-Vilant (TRELEGY ELLIPTA) 200-62.5-25 MCG/ACT AEPB Use  1 Inhalation  Daily  for COPD /Asthma 07/11/21   Adela Glimpse, NP  glucose blood (ACCU-CHEK AVIVA PLUS) test strip Check blood sugar 1 time daily-R73.09. 11/06/17    [name], MD  ipratropium-albuterol (DUONEB) 0.5-2.5 (3) MG/3ML SOLN USE 1 VIAL IN NEBULIZER EVERY 4 TO 6 HOURS - As Needed 05/12/22   Raynelle Dick, NP  losartan (COZAAR) 100 MG tablet TAKE 1 TABLET EVERY DAY FOR BLOOD PRESSURE 03/22/21   Judd Gaudier, NP  magnesium gluconate (MAGONATE) 500 MG tablet Take 500 mg by mouth daily.     [provider]  metoCLOPramide (REGLAN) 10 MG tablet Take  1 tablet  3 x/ day  with Lunch, Supper & Bedtime for Acid Indigestion & Reflux. 03/01/22   Adela Glimpse, NP  montelukast (SINGULAIR) 10 MG tablet Take 1 tablet (10 mg total) by mouth daily. 06/01/22 06/01/23  Raynelle Dick, NP  pantoprazole (PROTONIX) 40 MG tablet Take  1 tablet  2 x /day  to Prevent Indigestion &  Heartburn 10/10/21   [name], MD  rosuvastatin (CRESTOR) 40 MG tablet TAKE 1 TABLET DAILY FOR CHOLESTEROL 03/10/22   [name], MD  zinc gluconate 50 MG tablet Take 50 mg by mouth daily.    [provider]      Rutherford Guys, PA - [facility] For pager details, please see AMION or use Epic chat  After 1900, please call [facility] for cross coverage needs 08/22/2022, 4:19 PM

## 2022-08-22 NOTE — ED Triage Notes (Signed)
Patient BIB EMS from with Epigastric pressure since 0400 this morning. H/O acid reflux. ABD distended with burping.  VSS.

## 2022-08-22 NOTE — ED Provider Notes (Signed)
[facility] AT [facility] Provider Note   CSN: [id number] Arrival date & time: 08/22/22  1202     History  Chief Complaint  Patient presents with   Abdominal Pain    Jesus Bush is a 77 y.o. male.  HPI Patient presents for abdominal pain and distention, shortness of breath.  Medical history includes DM, HLD, HTN, COPD.  He was in his normal state of health yesterday.  This morning, at around 4 AM, he had some left flank and abdominal pain.  Pain resolved and he felt fine this morning.  He ate breakfast.  Later in the morning, he developed recurrence of pain.  It is now primarily in the upper abdomen/lower chest.  He has had associated shortness of breath.  He feels that his abdomen is distended.  Last bowel movement was this morning, and seem normal to him.  He has not had any nausea.  Currently, pain is resolved but he still feels abdominal distention.    Home Medications Prior to Admission medications   Medication Sig Start Date End Date Taking? Authorizing Provider  aspirin 81 MG tablet Take 81 mg by mouth daily.   Yes [provider]  cetirizine (ZYRTEC ALLERGY) 10 MG tablet Take one tablet nightly for allergies. 07/16/19  Yes McClanahan, Bella Kennedy, NP  Cholecalciferol (VITAMIN D) 2000 UNITS CAPS Take 2 capsules by mouth daily.    Yes [provider]  diltiazem (DILACOR XR) 240 MG 24 hr capsule TAKE 1 CAPSULE DAILY FOR BLOOD PRESSURE AND HEART RHYTHM 08/16/21  Yes Judd Gaudier, NP  famotidine (PEPCID) 40 MG tablet Take 1 tablet (40 mg total) by mouth 2 (two) times daily. 09/18/21  Yes Cranford, Archie Patten, NP  losartan (COZAAR) 100 MG tablet TAKE 1 TABLET EVERY DAY FOR BLOOD PRESSURE Patient taking differently: Take 100 mg by mouth daily. TAKE 1 TABLET EVERY DAY FOR BLOOD PRESSURE 03/22/21  Yes Judd Gaudier, NP  magnesium gluconate (MAGONATE) 500 MG tablet Take 500 mg by mouth daily.    Yes [provider]  metoCLOPramide  (REGLAN) 10 MG tablet Take  1 tablet  3 x/ day  with Lunch, Supper & Bedtime for Acid Indigestion & Reflux. 03/01/22  Yes Cranford, Archie Patten, NP  montelukast (SINGULAIR) 10 MG tablet Take 1 tablet (10 mg total) by mouth daily. 06/01/22 06/01/23 Yes Raynelle Dick, NP  pantoprazole (PROTONIX) 40 MG tablet Take  1 tablet  2 x /day  to Prevent Indigestion & Heartburn 10/10/21  Yes [name], MD  rosuvastatin (CRESTOR) 40 MG tablet TAKE 1 TABLET DAILY FOR CHOLESTEROL 03/10/22  Yes [name], MD  zinc gluconate 50 MG tablet Take 50 mg by mouth daily.   Yes [provider]  ACCU-CHEK SOFTCLIX LANCETS lancets Check blood sugar 1 time daily-DX-R73.09 11/06/17   [name], MD  albuterol (VENTOLIN HFA) 108 (90 Base) MCG/ACT inhaler 2 inhalations 15 minutes apart every 4 hours to Rescue Asthma 07/11/21   Adela Glimpse, NP  Alcohol Swabs (B-D SINGLE USE SWABS REGULAR) PADS Check blood sugar 1 time daily-DX-R73.09. 11/06/17   [name], MD  Blood Glucose Calibration (ACCU-CHEK AVIVA) SOLN Use per box instruction. 11/06/17   [name], MD  blood glucose meter kit and supplies KIT Test blood sugar daily or as directed 09/18/21   Adela Glimpse, NP  Blood Glucose Monitoring Suppl (ACCU-CHEK AVIVA PLUS) w/Device KIT Check blood sugar 1 time daily-DX-R73.09 11/06/17   [name], MD  fluticasone [facility]) 50 MCG/ACT nasal  spray 2 sprays each nostril daily 06/01/22   Raynelle Dick, NP  Fluticasone-Umeclidin-Vilant (TRELEGY ELLIPTA) 200-62.5-25 MCG/ACT AEPB Use  1 Inhalation  Daily  for COPD /Asthma 07/11/21   Adela Glimpse, NP  glucose blood (ACCU-CHEK AVIVA PLUS) test strip Check blood sugar 1 time daily-R73.09. 11/06/17   [name], MD  ipratropium-albuterol (DUONEB) 0.5-2.5 (3) MG/3ML SOLN USE 1 VIAL IN NEBULIZER EVERY 4 TO 6 HOURS - As Needed 05/12/22   Raynelle Dick, NP      Allergies    Ppd [tuberculin purified protein derivative]    Review of Systems    Review of Systems  Respiratory:  Positive for shortness of breath.   Gastrointestinal:  Positive for abdominal distention and abdominal pain.  All other systems reviewed and are negative.   Physical Exam Updated Vital Signs BP 127/71   Pulse 64   Temp 98 F (36.7 C)   Resp (!) 23   SpO2 95%  Physical Exam Vitals and nursing note reviewed.  Constitutional:      General: He is not in acute distress.    Appearance: Normal appearance. He is well-developed. He is not ill-appearing, toxic-appearing or diaphoretic.  HENT:     Head: Normocephalic and atraumatic.     Right Ear: External ear normal.     Left Ear: External ear normal.     Nose: Nose normal.     Mouth/Throat:     Mouth: Mucous membranes are moist.  Eyes:     Extraocular Movements: Extraocular movements intact.     Conjunctiva/sclera: Conjunctivae normal.  Cardiovascular:     Rate and Rhythm: Normal rate and regular rhythm.     Heart sounds: No murmur heard. Pulmonary:     Effort: Pulmonary effort is normal. No respiratory distress.     Breath sounds: Wheezing and rhonchi present.  Abdominal:     General: There is distension.     Palpations: Abdomen is soft.     Tenderness: There is no abdominal tenderness. There is no guarding or rebound.  Musculoskeletal:        General: No swelling. Normal range of motion.     Cervical back: Neck supple.     Right lower leg: No edema.     Left lower leg: No edema.  Skin:    General: Skin is warm and dry.     Coloration: Skin is not jaundiced or pale.  Neurological:     General: No focal deficit present.     Mental Status: He is alert and oriented to person, place, and time.  Psychiatric:        Mood and Affect: Mood normal.        Behavior: Behavior normal.     ED Results / Procedures / Treatments   Labs (all labs ordered are listed, but only abnormal results are displayed) Labs Reviewed  COMPREHENSIVE METABOLIC PANEL - Abnormal; Notable for the following  components:      Result Value   Glucose, Bld 147 (*)    Creatinine, Ser 1.31 (*)    AST 43 (*)    GFR, Estimated 56 (*)    All other components within normal limits  LIPASE, BLOOD  CBC WITH DIFFERENTIAL/PLATELET  LACTIC ACID, PLASMA  URINALYSIS, ROUTINE W REFLEX MICROSCOPIC  BASIC METABOLIC PANEL  CBC  TROPONIN I (HIGH SENSITIVITY)  TROPONIN I (HIGH SENSITIVITY)    EKG EKG Interpretation  Date/Time:  Wednesday Aug 22 2022 12:20:05 EDT Ventricular Rate:  81 PR Interval:  152 QRS Duration: 74 QT Interval:  354 QTC Calculation: 411 R Axis:   83 Text Interpretation: Normal sinus rhythm Low voltage QRS Nonspecific T wave abnormality Abnormal ECG Confirmed by Gloris Manchester [phone]) on 08/22/2022 12:44:07 PM  Radiology DG Chest Portable 1 View  Result Date: 08/22/2022 CLINICAL DATA:  Pneumothorax status post chest tube placement EXAM: PORTABLE CHEST 1 VIEW COMPARISON:  CTA chest dated 08/22/2022, chest radiograph dated 05/15/2021 FINDINGS: Lines/tubes: Left inferolateral approach pleural catheter tip projects over the left hilum. Lungs: Interval re-expansion of the left lung with patchy left basilar opacities. Pleura: Moderate left pneumothorax. Heart/mediastinum: The heart size and mediastinal contours are within normal limits. Bones: No acute osseous abnormality. Mild left chest wall subcutaneous emphysema. IMPRESSION: 1. Decreased moderate left pneumothorax with left inferolateral approach pleural catheter in place. 2. Interval re-expansion of the left lung with patchy left basilar opacities, likely atelectasis. Electronically Signed   By: Agustin Cree M.D.   On: 08/22/2022 15:55   CT ABDOMEN PELVIS W CONTRAST  Result Date: 08/22/2022 CLINICAL DATA:  Epigastric pressure associated with abdominal distention EXAM: CT ANGIOGRAPHY CHEST CT ABDOMEN AND PELVIS WITH CONTRAST TECHNIQUE: Multidetector CT imaging of the chest was performed using the standard protocol during bolus administration of  intravenous contrast. Multiplanar CT image reconstructions and MIPs were obtained to evaluate the vascular anatomy. Multidetector CT imaging of the abdomen and pelvis was performed using the standard protocol during bolus administration of intravenous contrast. RADIATION DOSE REDUCTION: This exam was performed according to the departmental dose-optimization program which includes automated exposure control, adjustment of the mA and/or kV according to patient size and/or use of iterative reconstruction technique. CONTRAST:  75mL OMNIPAQUE IOHEXOL 350 MG/ML SOLN COMPARISON:  CT chest dated 05/17/2021, 02/26/2020 FINDINGS: CTA CHEST FINDINGS Cardiovascular: The study is high quality for the evaluation of pulmonary embolism. There are no filling defects in the central, lobar, segmental or subsegmental pulmonary artery branches to suggest acute pulmonary embolism. Great vessels are normal in course and caliber. Mild left atrial enlargement. No significant pericardial fluid/thickening. Coronary artery calcifications. Mediastinum/Nodes: Rightward shift of the mediastinum. Imaged thyroid gland without nodules meeting criteria for imaging follow-up by size. Small hiatal hernia. No pathologically enlarged axillary, supraclavicular, mediastinal, or hilar lymph nodes. Lungs/Pleura: The central airways are patent. Near-complete atelectasis of the left lung. Posterior right apical pleural-parenchymal scarring. Left large left pneumothorax. No pleural effusion. Musculoskeletal: No acute or abnormal lytic or blastic osseous lesions. Multilevel degenerative changes of the thoracic spine. Unchanged subcutaneous density along the paramidline left upper back measures 1.5 x 1.2 cm (5:11), likely an inclusion cyst. Review of the MIP images confirms the above findings. CT ABDOMEN and PELVIS FINDINGS Hepatobiliary: Scattered subcentimeter hypodensities, too small to characterize but unchanged from 02/26/2020. No intra or extrahepatic  biliary ductal dilation. Normal gallbladder. Pancreas: No focal lesions or main ductal dilation. Spleen: Normal in size without focal abnormality. Adrenals/Urinary Tract: No adrenal nodules. No suspicious renal mass, calculi, or hydronephrosis. Bilateral hypodensities measuring up to 1.3 cm, likely simple/minimally complicated cysts. No specific follow-up imaging recommended. No focal bladder wall thickening. Stomach/Bowel: Normal appearance of the stomach. No evidence of bowel wall thickening, distention, or inflammatory changes. Colonic diverticulosis without acute diverticulitis. Normal appendix. Vascular/Lymphatic: Aortic atherosclerosis. No enlarged abdominal or pelvic lymph nodes. Reproductive: Enlargement of the prostate with median lobe hypertrophy. Other: No free fluid, fluid collection, or free air. Musculoskeletal: No acute or abnormal lytic or blastic osseous lesions. 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 Multilevel degenerative  changes of the lumbar spine. Age indeterminate superior endplate compression of L1. IMPRESSION: 1. No evidence of pulmonary embolism. 2. Large left pneumothorax with rightward shift of the mediastinum and near-complete atelectasis of the left lung. 3. No acute findings in the abdomen or pelvis. 4. Colonic diverticulosis without acute diverticulitis. 5. Enlargement of the prostate with median lobe hypertrophy. 6.  Aortic Atherosclerosis (ICD10-I70.0). Critical Value/emergent results were called by telephone at the time of interpretation on 08/22/2022 at 3:05 pm to provider Gloris Manchester , who verbally acknowledged these results. Electronically Signed   By: Agustin Cree M.D.   On: 08/22/2022 15:19   CT Angio Chest PE W and/or Wo Contrast  Result Date: 08/22/2022 CLINICAL DATA:  Epigastric pressure associated with abdominal distention EXAM: CT ANGIOGRAPHY CHEST CT ABDOMEN AND PELVIS WITH CONTRAST TECHNIQUE: Multidetector CT imaging of the chest was performed  using the standard protocol during bolus administration of intravenous contrast. Multiplanar CT image reconstructions and MIPs were obtained to evaluate the vascular anatomy. Multidetector CT imaging of the abdomen and pelvis was performed using the standard protocol during bolus administration of intravenous contrast. RADIATION DOSE REDUCTION: This exam was performed according to the departmental dose-optimization program which includes automated exposure control, adjustment of the mA and/or kV according to patient size and/or use of iterative reconstruction technique. CONTRAST:  75mL OMNIPAQUE IOHEXOL 350 MG/ML SOLN COMPARISON:  CT chest dated 05/17/2021, 02/26/2020 FINDINGS: CTA CHEST FINDINGS Cardiovascular: The study is high quality for the evaluation of pulmonary embolism. There are no filling defects in the central, lobar, segmental or subsegmental pulmonary artery branches to suggest acute pulmonary embolism. Great vessels are normal in course and caliber. Mild left atrial enlargement. No significant pericardial fluid/thickening. Coronary artery calcifications. Mediastinum/Nodes: Rightward shift of the mediastinum. Imaged thyroid gland without nodules meeting criteria for imaging follow-up by size. Small hiatal hernia. No pathologically enlarged axillary, supraclavicular, mediastinal, or hilar lymph nodes. Lungs/Pleura: The central airways are patent. Near-complete atelectasis of the left lung. Posterior right apical pleural-parenchymal scarring. Left large left pneumothorax. No pleural effusion. Musculoskeletal: No acute or abnormal lytic or blastic osseous lesions. Multilevel degenerative changes of the thoracic spine. Unchanged subcutaneous density along the paramidline left upper back measures 1.5 x 1.2 cm (5:11), likely an inclusion cyst. Review of the MIP images confirms the above findings. CT ABDOMEN and PELVIS FINDINGS Hepatobiliary: Scattered subcentimeter hypodensities, too small to characterize  but unchanged from 02/26/2020. No intra or extrahepatic biliary ductal dilation. Normal gallbladder. Pancreas: No focal lesions or main ductal dilation. Spleen: Normal in size without focal abnormality. Adrenals/Urinary Tract: No adrenal nodules. No suspicious renal mass, calculi, or hydronephrosis. Bilateral hypodensities measuring up to 1.3 cm, likely simple/minimally complicated cysts. No specific follow-up imaging recommended. No focal bladder wall thickening. Stomach/Bowel: Normal appearance of the stomach. No evidence of bowel wall thickening, distention, or inflammatory changes. Colonic diverticulosis without acute diverticulitis. Normal appendix. Vascular/Lymphatic: Aortic atherosclerosis. No enlarged abdominal or pelvic lymph nodes. Reproductive: Enlargement of the prostate with median lobe hypertrophy. Other: No free fluid, fluid collection, or free air. Musculoskeletal: No acute or abnormal lytic or blastic osseous lesions. 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 Multilevel degenerative changes of the lumbar spine. Age indeterminate superior endplate compression of L1. IMPRESSION: 1. No evidence of pulmonary embolism. 2. Large left pneumothorax with rightward shift of the mediastinum and near-complete atelectasis of the left lung. 3. No acute findings in the abdomen or pelvis. 4. Colonic diverticulosis without acute diverticulitis. 5. Enlargement of the prostate with median  lobe hypertrophy. 6.  Aortic Atherosclerosis (ICD10-I70.0). Critical Value/emergent results were called by telephone at the time of interpretation on 08/22/2022 at 3:05 pm to provider Gloris Manchester , who verbally acknowledged these results. Electronically Signed   By: Agustin Cree M.D.   On: 08/22/2022 15:19    Procedures CHEST TUBE INSERTION  Date/Time: 08/22/2022 3:25 PM  Performed by: Gloris Manchester, MD Authorized by: Gloris Manchester, MD   Consent:    Consent obtained:  Verbal   Consent given by:  Patient    Risks, benefits, and alternatives were discussed: yes     Risks discussed:  Pain, bleeding and damage to surrounding structures   Alternatives discussed:  No treatment and delayed treatment Universal protocol:    Procedure explained and questions answered to patient or proxy's satisfaction: yes     Imaging studies available: yes     Patient identity confirmed:  Verbally with patient Pre-procedure details:    Skin preparation:  Povidone-iodine   Preparation: Patient was prepped and draped in the usual sterile fashion   Sedation:    Sedation type:  None Anesthesia:    Anesthesia method:  Local infiltration   Local anesthetic:  Lidocaine 2% WITH epi Procedure details:    Placement location:  L lateral   Tube size [place]): Pigtail catheter.   Drainage characteristics:  Air only   Dressing:  Petrolatum-impregnated gauze Post-procedure details:    Post-insertion x-ray findings: tube in good position     Procedure completion:  Tolerated well, no immediate complications     Medications Ordered in ED Medications  fentaNYL (SUBLIMAZE) injection 50 mcg (50 mcg Intravenous Given 08/22/22 1247)  sodium chloride flush (NS) 0.9 % injection 10 mL (has no administration in time range)  albuterol (VENTOLIN HFA) 108 (90 Base) MCG/ACT inhaler 2 puff (has no administration in time range)  fluticasone furoate-vilanterol (BREO ELLIPTA) 200-25 MCG/ACT 1 puff (has no administration in time range)    And  umeclidinium bromide (INCRUSE ELLIPTA) 62.5 MCG/ACT 1 puff (has no administration in time range)  ipratropium-albuterol (DUONEB) 0.5-2.5 (3) MG/3ML nebulizer solution 3 mL (has no administration in time range)  heparin injection 5,000 Units (has no administration in time range)  acetaminophen (TYLENOL) tablet 650 mg (has no administration in time range)    Or  acetaminophen (TYLENOL) suppository 650 mg (has no administration in time range)  oxyCODONE (Oxy IR/ROXICODONE) immediate release tablet 5 mg  (has no administration in time range)  morphine (PF) 2 MG/ML injection 2 mg (has no administration in time range)  ondansetron (ZOFRAN) tablet 4 mg (has no administration in time range)    Or  ondansetron (ZOFRAN) injection 4 mg (has no administration in time range)  albuterol (PROVENTIL) (2.5 MG/3ML) 0.083% nebulizer solution 2.5 mg (has no administration in time range)  nicotine (NICODERM CQ - dosed in mg/24 hours) patch 21 mg (has no administration in time range)  hydrALAZINE (APRESOLINE) injection 5 mg (has no administration in time range)  rosuvastatin (CRESTOR) tablet 40 mg (has no administration in time range)  pantoprazole (PROTONIX) EC tablet 20 mg (has no administration in time range)  montelukast (SINGULAIR) tablet 10 mg (has no administration in time range)  losartan (COZAAR) tablet 100 mg (has no administration in time range)  diltiazem (CARDIZEM CD) 24 hr capsule 240 mg (has no administration in time range)  ipratropium-albuterol (DUONEB) 0.5-2.5 (3) MG/3ML nebulizer solution 3 mL (3 mLs Nebulization Given 08/22/22 1244)  lactated ringers bolus 500 mL (0 mLs Intravenous Stopped 08/22/22 1416)  lidocaine-EPINEPHrine (XYLOCAINE W/EPI) 2 %-1:200000 (PF) injection 10 mL (10 mLs Intradermal Given 08/22/22 1527)  iohexol (OMNIPAQUE) 350 MG/ML injection 75 mL (75 mLs Intravenous Contrast Given 08/22/22 1450)    ED Course/ Medical Decision Making/ A&P                             Medical Decision Making Amount and/or Complexity of Data Reviewed Labs: ordered. Radiology: ordered.  Risk Prescription drug management. Decision regarding hospitalization.   This patient presents to the ED for concern of abdominal pain, shortness of breath, this involves an extensive number of treatment options, and is a complaint that carries with it a high risk of complications and morbidity.  The differential diagnosis includes SBO, constipation, colitis, enteritis, pneumonia, COPD, pneumothorax   Co  morbidities that complicate the patient evaluation  DM, HLD, HTN, COPD   Additional history obtained:  Additional history obtained from N/A External records from outside source obtained and reviewed including EMR   Lab Tests:  I Ordered, and personally interpreted labs.  The pertinent results include: Baseline creatinine, normal electrolytes, normal hemoglobin, no leukocytosis, normal troponin   Imaging Studies ordered:  I ordered imaging studies including CTA chest, CT of abdomen pelvis I independently visualized and interpreted imaging which showed large left-sided pneumothorax I agree with the radiologist interpretation   Cardiac Monitoring: / EKG:  The patient was maintained on a cardiac monitor.  I personally viewed and interpreted the cardiac monitored which showed an underlying rhythm of: Sinus rhythm   Consultations Obtained:  I requested consultation with the pulmonologist,  and discussed lab and imaging findings as well as pertinent plan - they recommend: Pulmonology will follow in consult to manage chest tube   Problem List / ED Course / Critical interventions / Medication management  Patient presents for abdominal pain and distention.  He also endorses shortness of breath which he attributes to his abdominal distention.  On arrival in the ED, patient is well-appearing.  Abdomen is distended and tight.  He does not have any significant tenderness.  On lung auscultation, he does have wheezing and rhonchi present.  He does have history of COPD.  He continues to smoke occasionally.  He will use inhaler at home at times.  EKG shows normal sinus rhythm.  DuoNeb was ordered for wheezing.  Workup was initiated.  Patient underwent CT imaging of chest, abdomen, pelvis.  On imaging, a large left-sided pneumothorax was identified.  Imaging showed concern of early tension physiology.  Despite this, patient remained well-appearing and reported no worsening of symptoms.  He was  consented for chest tube and this was placed at bedside.  Shortly thereafter, he had resolution of all symptoms.  X-ray showed improved left hemithorax.  I spoke with pulmonology who will follow in consult to manage chest tube.  Patient was admitted to medicine for further management. I ordered medication including IV fluid for hydration; DuoNeb for wheezing; lidocaine for local anesthesia for chest tube insertion Reevaluation of the patient after these medicines showed that the patient resolved I have reviewed the patients home medicines and have made adjustments as needed   Social Determinants of Health:  Lives independently  CRITICAL CARE Performed by: Gloris Manchester   Total critical care time: 32 minutes  Critical care time was exclusive of separately billable procedures and treating other patients.  Critical care was necessary to treat or prevent imminent or life-threatening deterioration.  Critical care was time spent  personally by me on the following activities: development of treatment plan with patient and/or surrogate as well as nursing, discussions with consultants, evaluation of patient's response to treatment, examination of patient, obtaining history from patient or surrogate, ordering and performing treatments and interventions, ordering and review of laboratory studies, ordering and review of radiographic studies, pulse oximetry and re-evaluation of patient's condition.        Final Clinical Impression(s) / ED Diagnoses Final diagnoses:  Spontaneous pneumothorax    Rx / DC Orders ED Discharge Orders     None         Gloris Manchester, MD 08/22/22 1728

## 2022-08-22 NOTE — ED Notes (Signed)
ED TO INPATIENT HANDOFF REPORT  ED Nurse Name and Phone #:   S Name/Age/Gender Duanne Guess 77 y.o. male Room/Bed: 007C/007C  Code Status   Code Status: Full Code  Home/SNF/Other Home Patient oriented to: self, place, time, and situation Is this baseline? Yes   Triage Complete: Triage complete  Chief Complaint Pneumothorax [J93.9]  Triage Note Patient BIB EMS from with Epigastric pressure since 0400 this morning. H/O acid reflux. ABD distended with burping.  VSS.    Allergies Allergies  Allergen Reactions   Ppd [Tuberculin Purified Protein Derivative]     Positive PPD.    Level of Care/Admitting Diagnosis ED Disposition     ED Disposition  Admit   Condition  --   Comment  Hospital Area: [facility] [[id number]]  Level of Care: Progressive [102]  Admit to Progressive based on following criteria: RESPIRATORY PROBLEMS hypoxemic/hypercapnic respiratory failure that is responsive to NIPPV (BiPAP) or High Flow Nasal Cannula (6-80 lpm). Frequent assessment/intervention, no > Q2 hrs < Q4 hrs, to maintain oxygenation and pulmonary hygiene.  May admit patient to [facility] or [facility] if equivalent level of care is available:: No  Covid Evaluation: Asymptomatic - no recent exposure (last 10 days) testing not required  Diagnosis: Pneumothorax [161096]  Admitting Physician: Chiquita Loth  Attending Physician: Starleen Arms [[id number]]  Bed request comments: please admit to 5 west  Certification:: I certify this patient will need inpatient services for at least 2 midnights  Estimated Length of Stay: 2          B Medical/Surgery History Past Medical History:  Diagnosis Date   Diabetes mellitus without complication (HCC)    Hyperlipidemia    Hypertension    Vitamin D deficiency    Past Surgical History:  Procedure Laterality Date   CATARACT EXTRACTION, BILATERAL Bilateral 2018   Dr. Wayland Denis     A IV  Location/Drains/Wounds Patient Lines/Drains/Airways Status     Active Line/Drains/Airways     Name Placement date Placement time Site Days   Peripheral IV 08/22/22 18 G Right Antecubital 08/22/22  1234  Antecubital  less than 1   Chest Tube 1 Lateral;Left Mediastinal 08/22/22  1605  Mediastinal  less than 1            Intake/Output Last 24 hours No intake or output data in the 24 hours ending 08/22/22 1642  Labs/Imaging Results for orders placed or performed during the hospital encounter of 08/22/22 (from the past 48 hour(s))  Lactic acid, plasma     Status: None   Collection Time: 08/22/22 12:31 PM  Result Value Ref Range   Lactic Acid, Venous 1.7 0.5 - 1.9 mmol/L    Comment: Performed at [facility], [address], [place], [place] [address]  Comprehensive metabolic panel     Status: Abnormal   Collection Time: 08/22/22 12:48 PM  Result Value Ref Range   Sodium 139 135 - 145 mmol/L   Potassium 4.0 3.5 - 5.1 mmol/L   Chloride 102 98 - 111 mmol/L   CO2 26 22 - 32 mmol/L   Glucose, Bld 147 (H) 70 - 99 mg/dL    Comment: Glucose reference range applies only to samples taken after fasting for at least 8 hours.   BUN 11 8 - 23 mg/dL   Creatinine, Ser 9.81 (H) 0.61 - 1.24 mg/dL   Calcium 9.7 8.9 - 19.1 mg/dL   Total Protein 6.9 6.5 - 8.1 g/dL  Albumin 3.8 3.5 - 5.0 g/dL   AST 43 (H) 15 - 41 U/L   ALT 42 0 - 44 U/L   Alkaline Phosphatase 81 38 - 126 U/L   Total Bilirubin 0.5 0.3 - 1.2 mg/dL   GFR, Estimated 56 (L) >60 mL/min    Comment: (NOTE) Calculated using the CKD-EPI Creatinine Equation (2021)    Anion gap 11 5 - 15    Comment: Performed at [facility], [address] [place], [place] [address]  Lipase, blood     Status: None   Collection Time: 08/22/22 12:48 PM  Result Value Ref Range   Lipase 44 11 - 51 U/L    Comment: Performed at [facility], [address], [place], [place] [address]  CBC with Diff     Status: None   Collection Time:  08/22/22 12:48 PM  Result Value Ref Range   WBC 10.5 4.0 - 10.5 K/uL   RBC 4.73 4.22 - 5.81 MIL/uL   Hemoglobin 14.8 13.0 - 17.0 g/dL   HCT 09.8 11.9 - 14.7 %   MCV 94.7 80.0 - 100.0 fL   MCH 31.3 26.0 - 34.0 pg   MCHC 33.0 30.0 - 36.0 g/dL   RDW 82.9 56.2 - 13.0 %   Platelets 252 150 - 400 K/uL   nRBC 0.0 0.0 - 0.2 %   Neutrophils Relative % 72 %   Neutro Abs 7.6 1.7 - 7.7 K/uL   Lymphocytes Relative 18 %   Lymphs Abs 1.8 0.7 - 4.0 K/uL   Monocytes Relative 8 %   Monocytes Absolute 0.8 0.1 - 1.0 K/uL   Eosinophils Relative 1 %   Eosinophils Absolute 0.1 0.0 - 0.5 K/uL   Basophils Relative 1 %   Basophils Absolute 0.1 0.0 - 0.1 K/uL   Immature Granulocytes 0 %   Abs Immature Granulocytes 0.04 0.00 - 0.07 K/uL    Comment: Performed at [facility], [address] [place], [place] [address]  Troponin I (High Sensitivity)     Status: None   Collection Time: 08/22/22 12:48 PM  Result Value Ref Range   Troponin I (High Sensitivity) 9 <18 ng/L    Comment: (NOTE) Elevated high sensitivity troponin I (hsTnI) values and significant  changes across serial measurements may suggest ACS but many other  chronic and acute conditions are known to elevate hsTnI results.  Refer to the "Links" section for chest pain algorithms and additional  guidance. Performed at [facility], [address] [place], [place] [address]   Troponin I (High Sensitivity)     Status: None   Collection Time: 08/22/22  2:14 PM  Result Value Ref Range   Troponin I (High Sensitivity) 8 <18 ng/L    Comment: (NOTE) Elevated high sensitivity troponin I (hsTnI) values and significant  changes across serial measurements may suggest ACS but many other  chronic and acute conditions are known to elevate hsTnI results.  Refer to the "Links" section for chest pain algorithms and additional  guidance. Performed at [facility], [address] [place], [place] [address]    DG Chest Portable 1  View  Result Date: 08/22/2022 CLINICAL DATA:  Pneumothorax status post chest tube placement EXAM: PORTABLE CHEST 1 VIEW COMPARISON:  CTA chest dated 08/22/2022, chest radiograph dated 05/15/2021 FINDINGS: Lines/tubes: Left inferolateral approach pleural catheter tip projects over the left hilum. Lungs: Interval re-expansion of the left lung with patchy left basilar opacities. Pleura:  Moderate left pneumothorax. Heart/mediastinum: The heart size and mediastinal contours are within normal limits. Bones: No acute osseous abnormality. Mild left chest wall subcutaneous emphysema. IMPRESSION: 1. Decreased moderate left pneumothorax with left inferolateral approach pleural catheter in place. 2. Interval re-expansion of the left lung with patchy left basilar opacities, likely atelectasis. Electronically Signed   By: Agustin Cree M.D.   On: 08/22/2022 15:55   CT ABDOMEN PELVIS W CONTRAST  Result Date: 08/22/2022 CLINICAL DATA:  Epigastric pressure associated with abdominal distention EXAM: CT ANGIOGRAPHY CHEST CT ABDOMEN AND PELVIS WITH CONTRAST TECHNIQUE: Multidetector CT imaging of the chest was performed using the standard protocol during bolus administration of intravenous contrast. Multiplanar CT image reconstructions and MIPs were obtained to evaluate the vascular anatomy. Multidetector CT imaging of the abdomen and pelvis was performed using the standard protocol during bolus administration of intravenous contrast. RADIATION DOSE REDUCTION: This exam was performed according to the departmental dose-optimization program which includes automated exposure control, adjustment of the mA and/or kV according to patient size and/or use of iterative reconstruction technique. CONTRAST:  75mL OMNIPAQUE IOHEXOL 350 MG/ML SOLN COMPARISON:  CT chest dated 05/17/2021, 02/26/2020 FINDINGS: CTA CHEST FINDINGS Cardiovascular: The study is high quality for the evaluation of pulmonary embolism. There are no filling defects in the  central, lobar, segmental or subsegmental pulmonary artery branches to suggest acute pulmonary embolism. Great vessels are normal in course and caliber. Mild left atrial enlargement. No significant pericardial fluid/thickening. Coronary artery calcifications. Mediastinum/Nodes: Rightward shift of the mediastinum. Imaged thyroid gland without nodules meeting criteria for imaging follow-up by size. Small hiatal hernia. No pathologically enlarged axillary, supraclavicular, mediastinal, or hilar lymph nodes. Lungs/Pleura: The central airways are patent. Near-complete atelectasis of the left lung. Posterior right apical pleural-parenchymal scarring. Left large left pneumothorax. No pleural effusion. Musculoskeletal: No acute or abnormal lytic or blastic osseous lesions. Multilevel degenerative changes of the thoracic spine. Unchanged subcutaneous density along the paramidline left upper back measures 1.5 x 1.2 cm (5:11), likely an inclusion cyst. Review of the MIP images confirms the above findings. CT ABDOMEN and PELVIS FINDINGS Hepatobiliary: Scattered subcentimeter hypodensities, too small to characterize but unchanged from 02/26/2020. No intra or extrahepatic biliary ductal dilation. Normal gallbladder. Pancreas: No focal lesions or main ductal dilation. Spleen: Normal in size without focal abnormality. Adrenals/Urinary Tract: No adrenal nodules. No suspicious renal mass, calculi, or hydronephrosis. Bilateral hypodensities measuring up to 1.3 cm, likely simple/minimally complicated cysts. No specific follow-up imaging recommended. No focal bladder wall thickening. Stomach/Bowel: Normal appearance of the stomach. No evidence of bowel wall thickening, distention, or inflammatory changes. Colonic diverticulosis without acute diverticulitis. Normal appendix. Vascular/Lymphatic: Aortic atherosclerosis. No enlarged abdominal or pelvic lymph nodes. Reproductive: Enlargement of the prostate with median lobe hypertrophy.  Other: No free fluid, fluid collection, or free air. Musculoskeletal: No acute or abnormal lytic or blastic osseous lesions. 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 Multilevel degenerative changes of the lumbar spine. Age indeterminate superior endplate compression of L1. IMPRESSION: 1. No evidence of pulmonary embolism. 2. Large left pneumothorax with rightward shift of the mediastinum and near-complete atelectasis of the left lung. 3. No acute findings in the abdomen or pelvis. 4. Colonic diverticulosis without acute diverticulitis. 5. Enlargement of the prostate with median lobe hypertrophy. 6.  Aortic Atherosclerosis (ICD10-I70.0). Critical Value/emergent results were called by telephone at the time of interpretation on 08/22/2022 at 3:05 pm to provider Gloris Manchester , who verbally acknowledged these results. Electronically Signed   By: Agustin Cree  M.D.   On: 08/22/2022 15:19   CT Angio Chest PE W and/or Wo Contrast  Result Date: 08/22/2022 CLINICAL DATA:  Epigastric pressure associated with abdominal distention EXAM: CT ANGIOGRAPHY CHEST CT ABDOMEN AND PELVIS WITH CONTRAST TECHNIQUE: Multidetector CT imaging of the chest was performed using the standard protocol during bolus administration of intravenous contrast. Multiplanar CT image reconstructions and MIPs were obtained to evaluate the vascular anatomy. Multidetector CT imaging of the abdomen and pelvis was performed using the standard protocol during bolus administration of intravenous contrast. RADIATION DOSE REDUCTION: This exam was performed according to the departmental dose-optimization program which includes automated exposure control, adjustment of the mA and/or kV according to patient size and/or use of iterative reconstruction technique. CONTRAST:  75mL OMNIPAQUE IOHEXOL 350 MG/ML SOLN COMPARISON:  CT chest dated 05/17/2021, 02/26/2020 FINDINGS: CTA CHEST FINDINGS Cardiovascular: The study is high quality for the evaluation of  pulmonary embolism. There are no filling defects in the central, lobar, segmental or subsegmental pulmonary artery branches to suggest acute pulmonary embolism. Great vessels are normal in course and caliber. Mild left atrial enlargement. No significant pericardial fluid/thickening. Coronary artery calcifications. Mediastinum/Nodes: Rightward shift of the mediastinum. Imaged thyroid gland without nodules meeting criteria for imaging follow-up by size. Small hiatal hernia. No pathologically enlarged axillary, supraclavicular, mediastinal, or hilar lymph nodes. Lungs/Pleura: The central airways are patent. Near-complete atelectasis of the left lung. Posterior right apical pleural-parenchymal scarring. Left large left pneumothorax. No pleural effusion. Musculoskeletal: No acute or abnormal lytic or blastic osseous lesions. Multilevel degenerative changes of the thoracic spine. Unchanged subcutaneous density along the paramidline left upper back measures 1.5 x 1.2 cm (5:11), likely an inclusion cyst. Review of the MIP images confirms the above findings. CT ABDOMEN and PELVIS FINDINGS Hepatobiliary: Scattered subcentimeter hypodensities, too small to characterize but unchanged from 02/26/2020. No intra or extrahepatic biliary ductal dilation. Normal gallbladder. Pancreas: No focal lesions or main ductal dilation. Spleen: Normal in size without focal abnormality. Adrenals/Urinary Tract: No adrenal nodules. No suspicious renal mass, calculi, or hydronephrosis. Bilateral hypodensities measuring up to 1.3 cm, likely simple/minimally complicated cysts. No specific follow-up imaging recommended. No focal bladder wall thickening. Stomach/Bowel: Normal appearance of the stomach. No evidence of bowel wall thickening, distention, or inflammatory changes. Colonic diverticulosis without acute diverticulitis. Normal appendix. Vascular/Lymphatic: Aortic atherosclerosis. No enlarged abdominal or pelvic lymph nodes. Reproductive:  Enlargement of the prostate with median lobe hypertrophy. Other: No free fluid, fluid collection, or free air. Musculoskeletal: No acute or abnormal lytic or blastic osseous lesions. 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 Multilevel degenerative changes of the lumbar spine. Age indeterminate superior endplate compression of L1. IMPRESSION: 1. No evidence of pulmonary embolism. 2. Large left pneumothorax with rightward shift of the mediastinum and near-complete atelectasis of the left lung. 3. No acute findings in the abdomen or pelvis. 4. Colonic diverticulosis without acute diverticulitis. 5. Enlargement of the prostate with median lobe hypertrophy. 6.  Aortic Atherosclerosis (ICD10-I70.0). Critical Value/emergent results were called by telephone at the time of interpretation on 08/22/2022 at 3:05 pm to provider Gloris Manchester , who verbally acknowledged these results. Electronically Signed   By: Agustin Cree M.D.   On: 08/22/2022 15:19    Pending Labs Unresulted Labs (From admission, onward)     Start     Ordered   08/22/22 1214  Urinalysis, Routine w reflex microscopic -Urine, Clean Catch  Once,   URGENT       Question:  Specimen Source  Answer:  Urine,  Clean Catch   08/22/22 1214   Signed and Held  Basic metabolic panel  Tomorrow morning,   R        Signed and Held   Signed and Held  CBC  Tomorrow morning,   R        Signed and Held            Vitals/Pain Today's Vitals   08/22/22 1415 08/22/22 1430 08/22/22 1603 08/22/22 1615  BP: 124/74 127/71  127/71  Pulse: 66 80  64  Resp: 18 (!) 24  (!) 23  Temp:   98 F (36.7 C)   TempSrc:      SpO2: 95% 97%  95%  PainSc:        Isolation Precautions No active isolations  Medications Medications  fentaNYL (SUBLIMAZE) injection 50 mcg (50 mcg Intravenous Given 08/22/22 1247)  sodium chloride flush (NS) 0.9 % injection 10 mL (has no administration in time range)  albuterol (VENTOLIN HFA) 108 (90 Base) MCG/ACT inhaler 2  puff (has no administration in time range)  fluticasone furoate-vilanterol (BREO ELLIPTA) 200-25 MCG/ACT 1 puff (has no administration in time range)    And  umeclidinium bromide (INCRUSE ELLIPTA) 62.5 MCG/ACT 1 puff (has no administration in time range)  ipratropium-albuterol (DUONEB) 0.5-2.5 (3) MG/3ML nebulizer solution 3 mL (has no administration in time range)  ipratropium-albuterol (DUONEB) 0.5-2.5 (3) MG/3ML nebulizer solution 3 mL (3 mLs Nebulization Given 08/22/22 1244)  lactated ringers bolus 500 mL (0 mLs Intravenous Stopped 08/22/22 1416)  lidocaine-EPINEPHrine (XYLOCAINE W/EPI) 2 %-1:200000 (PF) injection 10 mL (10 mLs Intradermal Given 08/22/22 1527)  iohexol (OMNIPAQUE) 350 MG/ML injection 75 mL (75 mLs Intravenous Contrast Given 08/22/22 1450)    Mobility walks     Focused Assessments Pulmonary Assessment Handoff:  Lung sounds:   O2 Device: Room Air      R Recommendations: See Admitting Provider Note  Report given to:   Additional Notes:

## 2022-08-22 NOTE — H&P (Signed)
TRH H&P   Patient Demographics:    Jesus Bush, is a 77 y.o. male  MRN: [id number]   DOB - 07/16/1945  Admit Date - 08/22/2022  Outpatient Primary MD for the patient is [name], MD  Referring MD/NP/PA: Dr Eloise Harman  Patient coming from: home  Chief Complaint  Patient presents with   Abdominal Pain      HPI:    Race Hider  is a 77 y.o. male, with past medical history of active tobacco abuse, COPD, hypertension, hyperlipidemia, aortic atherosclerosis, diabetes mellitus, patient presents to ED secondary to complaints of bloating, epigastric pain, reports pain has started earlier this morning around 4 AM, has been intermittent, resolved with breakfast, but happened again late a.m., was radiating to his abdomen and lower chest, and he does report some dyspnea, denies fever, chills.  He denies any fall, no trauma. -NAD patient had CT chest/abdomen, and CTA chest, which did show large left-sided pneumothorax, so chest tube has been inserted by ED physician, with follow-up chest x-ray showing mental and near reexpansion of the left lung, [facility] were consulted to admit.    Review of systems:   A full 10 point Review of Systems was done, except as stated above, all other Review of Systems were negative.   With Past History of the following :    Past Medical History:  Diagnosis Date   Diabetes mellitus without complication (HCC)    Hyperlipidemia    Hypertension    Vitamin D deficiency       Past Surgical History:  Procedure Laterality Date   CATARACT EXTRACTION, BILATERAL Bilateral 2018   Dr. Wayland Denis      Social History:     Social History   Tobacco Use   Smoking status: Former    Packs/day: 1.00    Years: 44.00    Additional pack years: 0.00    Total pack years: 44.00    Types: Cigarettes    Start date: 43    Quit date: 05/30/2022     Years since quitting: 0.2   Smokeless tobacco: Never  Substance Use Topics   Alcohol use: No    Alcohol/week: 0.0 standard drinks of alcohol    Comment: He states he no longer drinks alcohol      Family History :     Family History  Problem Relation Age of Onset   Heart attack Mother    Heart disease Mother    Heart attack Brother    Hypertension Brother    Diabetes Sister    Heart disease Sister    Heart disease Father    Heart attack Father      Home Medications:   Prior to Admission medications   Medication Sig Start Date End Date Taking? Authorizing Provider  ACCU-CHEK SOFTCLIX LANCETS lancets Check blood sugar 1 time daily-DX-R73.09 11/06/17   [name], MD  albuterol (VENTOLIN  HFA) 108 (90 Base) MCG/ACT inhaler 2 inhalations 15 minutes apart every 4 hours to Rescue Asthma 07/11/21   Adela Glimpse, NP  Alcohol Swabs (B-D SINGLE USE SWABS REGULAR) PADS Check blood sugar 1 time daily-DX-R73.09. 11/06/17   [name], MD  aspirin 81 MG tablet Take 81 mg by mouth daily.    [provider]  Blood Glucose Calibration (ACCU-CHEK AVIVA) SOLN Use per box instruction. 11/06/17   [name], MD  blood glucose meter kit and supplies KIT Test blood sugar daily or as directed 09/18/21   Adela Glimpse, NP  Blood Glucose Monitoring Suppl (ACCU-CHEK AVIVA PLUS) w/Device KIT Check blood sugar 1 time daily-DX-R73.09 11/06/17   [name], MD  cetirizine (ZYRTEC ALLERGY) 10 MG tablet Take one tablet nightly for allergies. 07/16/19   Elder Negus, NP  Cholecalciferol (VITAMIN D) 2000 UNITS CAPS Take 2 capsules by mouth daily.     [provider]  diltiazem (DILACOR XR) 240 MG 24 hr capsule TAKE 1 CAPSULE DAILY FOR BLOOD PRESSURE AND HEART RHYTHM 08/16/21   Judd Gaudier, NP  famotidine (PEPCID) 40 MG tablet Take 1 tablet (40 mg total) by mouth 2 (two) times daily. 09/18/21   Adela Glimpse, NP  fluticasone (FLONASE) 50 MCG/ACT nasal spray 2  sprays each nostril daily 06/01/22   Raynelle Dick, NP  Fluticasone-Umeclidin-Vilant (TRELEGY ELLIPTA) 200-62.5-25 MCG/ACT AEPB Use  1 Inhalation  Daily  for COPD /Asthma 07/11/21   Adela Glimpse, NP  glucose blood (ACCU-CHEK AVIVA PLUS) test strip Check blood sugar 1 time daily-R73.09. 11/06/17   [name], MD  ipratropium-albuterol (DUONEB) 0.5-2.5 (3) MG/3ML SOLN USE 1 VIAL IN NEBULIZER EVERY 4 TO 6 HOURS - As Needed 05/12/22   Raynelle Dick, NP  losartan (COZAAR) 100 MG tablet TAKE 1 TABLET EVERY DAY FOR BLOOD PRESSURE 03/22/21   Judd Gaudier, NP  magnesium gluconate (MAGONATE) 500 MG tablet Take 500 mg by mouth daily.     [provider]  metoCLOPramide (REGLAN) 10 MG tablet Take  1 tablet  3 x/ day  with Lunch, Supper & Bedtime for Acid Indigestion & Reflux. 03/01/22   Adela Glimpse, NP  montelukast (SINGULAIR) 10 MG tablet Take 1 tablet (10 mg total) by mouth daily. 06/01/22 06/01/23  Raynelle Dick, NP  pantoprazole (PROTONIX) 40 MG tablet Take  1 tablet  2 x /day  to Prevent Indigestion & Heartburn 10/10/21   [name], MD  rosuvastatin (CRESTOR) 40 MG tablet TAKE 1 TABLET DAILY FOR CHOLESTEROL 03/10/22   [name], MD  zinc gluconate 50 MG tablet Take 50 mg by mouth daily.    [provider]     Allergies:     Allergies  Allergen Reactions   Ppd [Tuberculin Purified Protein Derivative]     Positive PPD.     Physical Exam:   Vitals  Blood pressure 127/71, pulse 64, temperature 98 F (36.7 C), resp. rate (!) 23, SpO2 95 %.   1. General well developed male  lying in bed in NAD,    2. Normal affect and insight, Not Suicidal or Homicidal, Awake Alert, Oriented X 3.  3. No F.N deficits, ALL C.Nerves Intact, Strength 5/5 all 4 extremities, Sensation intact all 4 extremities, Plantars down going.  4. Ears and Eyes appear Normal, Conjunctivae clear, PERRLA. Moist Oral Mucosa.  5. Supple Neck, No JVD, No cervical lymphadenopathy  appriciated, No Carotid Bruits.  6. Symmetrical Chest wall movement, unlabored, no use of accessory muscle, minimally diminished in  the left lung, clear though.  7. RRR, No Gallops, Rubs or Murmurs, No Parasternal Heave.  8. Positive Bowel Sounds, Abdomen Soft, No tenderness, No organomegaly appriciated,No rebound -guarding or rigidity.  9.  No Cyanosis, Normal Skin Turgor, No Skin Rash or Bruise.  10. Good muscle tone,  joints appear normal , no effusions, Normal ROM.    Data Review:    CBC Recent Labs  Lab 08/22/22 1248  WBC 10.5  HGB 14.8  HCT 44.8  PLT 252  MCV 94.7  MCH 31.3  MCHC 33.0  RDW 12.8  LYMPHSABS 1.8  MONOABS 0.8  EOSABS 0.1  BASOSABS 0.1   ------------------------------------------------------------------------------------------------------------------  Chemistries  Recent Labs  Lab 08/22/22 1248  NA 139  K 4.0  CL 102  CO2 26  GLUCOSE 147*  BUN 11  CREATININE 1.31*  CALCIUM 9.7  AST 43*  ALT 42  ALKPHOS 81  BILITOT 0.5   ------------------------------------------------------------------------------------------------------------------ CrCl cannot be calculated (Unknown ideal weight.). ------------------------------------------------------------------------------------------------------------------ No results for input(s): "TSH", "T4TOTAL", "T3FREE", "THYROIDAB" in the last 72 hours.  Invalid input(s): "FREET3"  Coagulation profile No results for input(s): "INR", "PROTIME" in the last 168 hours. ------------------------------------------------------------------------------------------------------------------- No results for input(s): "DDIMER" in the last 72 hours. -------------------------------------------------------------------------------------------------------------------  Cardiac Enzymes No results for input(s): "CKMB", "TROPONINI", "MYOGLOBIN" in the last 168 hours.  Invalid input(s):  "CK" ------------------------------------------------------------------------------------------------------------------ No results found for: "BNP"   ---------------------------------------------------------------------------------------------------------------  Urinalysis    Component Value Date/Time   COLORURINE YELLOW 01/08/2022 1133   APPEARANCEUR CLEAR 01/08/2022 1133   LABSPEC 1.010 01/08/2022 1133   PHURINE 7.0 01/08/2022 1133   GLUCOSEU NEGATIVE 01/08/2022 1133   HGBUR NEGATIVE 01/08/2022 1133   BILIRUBINUR NEGATIVE 08/06/2016 1450   KETONESUR NEGATIVE 01/08/2022 1133   PROTEINUR NEGATIVE 01/08/2022 1133   NITRITE NEGATIVE 01/08/2022 1133   LEUKOCYTESUR TRACE (A) 01/08/2022 1133    ----------------------------------------------------------------------------------------------------------------   Imaging Results:    DG Chest Portable 1 View  Result Date: 08/22/2022 CLINICAL DATA:  Pneumothorax status post chest tube placement EXAM: PORTABLE CHEST 1 VIEW COMPARISON:  CTA chest dated 08/22/2022, chest radiograph dated 05/15/2021 FINDINGS: Lines/tubes: Left inferolateral approach pleural catheter tip projects over the left hilum. Lungs: Interval re-expansion of the left lung with patchy left basilar opacities. Pleura: Moderate left pneumothorax. Heart/mediastinum: The heart size and mediastinal contours are within normal limits. Bones: No acute osseous abnormality. Mild left chest wall subcutaneous emphysema. IMPRESSION: 1. Decreased moderate left pneumothorax with left inferolateral approach pleural catheter in place. 2. Interval re-expansion of the left lung with patchy left basilar opacities, likely atelectasis. Electronically Signed   By: Agustin Cree M.D.   On: 08/22/2022 15:55   CT ABDOMEN PELVIS W CONTRAST  Result Date: 08/22/2022 CLINICAL DATA:  Epigastric pressure associated with abdominal distention EXAM: CT ANGIOGRAPHY CHEST CT ABDOMEN AND PELVIS WITH CONTRAST TECHNIQUE:  Multidetector CT imaging of the chest was performed using the standard protocol during bolus administration of intravenous contrast. Multiplanar CT image reconstructions and MIPs were obtained to evaluate the vascular anatomy. Multidetector CT imaging of the abdomen and pelvis was performed using the standard protocol during bolus administration of intravenous contrast. RADIATION DOSE REDUCTION: This exam was performed according to the departmental dose-optimization program which includes automated exposure control, adjustment of the mA and/or kV according to patient size and/or use of iterative reconstruction technique. CONTRAST:  75mL OMNIPAQUE IOHEXOL 350 MG/ML SOLN COMPARISON:  CT chest dated 05/17/2021, 02/26/2020 FINDINGS: CTA CHEST FINDINGS Cardiovascular: The study is high quality for the evaluation of pulmonary  embolism. There are no filling defects in the central, lobar, segmental or subsegmental pulmonary artery branches to suggest acute pulmonary embolism. Great vessels are normal in course and caliber. Mild left atrial enlargement. No significant pericardial fluid/thickening. Coronary artery calcifications. Mediastinum/Nodes: Rightward shift of the mediastinum. Imaged thyroid gland without nodules meeting criteria for imaging follow-up by size. Small hiatal hernia. No pathologically enlarged axillary, supraclavicular, mediastinal, or hilar lymph nodes. Lungs/Pleura: The central airways are patent. Near-complete atelectasis of the left lung. Posterior right apical pleural-parenchymal scarring. Left large left pneumothorax. No pleural effusion. Musculoskeletal: No acute or abnormal lytic or blastic osseous lesions. Multilevel degenerative changes of the thoracic spine. Unchanged subcutaneous density along the paramidline left upper back measures 1.5 x 1.2 cm (5:11), likely an inclusion cyst. Review of the MIP images confirms the above findings. CT ABDOMEN and PELVIS FINDINGS Hepatobiliary: Scattered  subcentimeter hypodensities, too small to characterize but unchanged from 02/26/2020. No intra or extrahepatic biliary ductal dilation. Normal gallbladder. Pancreas: No focal lesions or main ductal dilation. Spleen: Normal in size without focal abnormality. Adrenals/Urinary Tract: No adrenal nodules. No suspicious renal mass, calculi, or hydronephrosis. Bilateral hypodensities measuring up to 1.3 cm, likely simple/minimally complicated cysts. No specific follow-up imaging recommended. No focal bladder wall thickening. Stomach/Bowel: Normal appearance of the stomach. No evidence of bowel wall thickening, distention, or inflammatory changes. Colonic diverticulosis without acute diverticulitis. Normal appendix. Vascular/Lymphatic: Aortic atherosclerosis. No enlarged abdominal or pelvic lymph nodes. Reproductive: Enlargement of the prostate with median lobe hypertrophy. Other: No free fluid, fluid collection, or free air. Musculoskeletal: No acute or abnormal lytic or blastic osseous lesions. 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 Multilevel degenerative changes of the lumbar spine. Age indeterminate superior endplate compression of L1. IMPRESSION: 1. No evidence of pulmonary embolism. 2. Large left pneumothorax with rightward shift of the mediastinum and near-complete atelectasis of the left lung. 3. No acute findings in the abdomen or pelvis. 4. Colonic diverticulosis without acute diverticulitis. 5. Enlargement of the prostate with median lobe hypertrophy. 6.  Aortic Atherosclerosis (ICD10-I70.0). Critical Value/emergent results were called by telephone at the time of interpretation on 08/22/2022 at 3:05 pm to provider Gloris Manchester , who verbally acknowledged these results. Electronically Signed   By: Agustin Cree M.D.   On: 08/22/2022 15:19   CT Angio Chest PE W and/or Wo Contrast  Result Date: 08/22/2022 CLINICAL DATA:  Epigastric pressure associated with abdominal distention EXAM: CT  ANGIOGRAPHY CHEST CT ABDOMEN AND PELVIS WITH CONTRAST TECHNIQUE: Multidetector CT imaging of the chest was performed using the standard protocol during bolus administration of intravenous contrast. Multiplanar CT image reconstructions and MIPs were obtained to evaluate the vascular anatomy. Multidetector CT imaging of the abdomen and pelvis was performed using the standard protocol during bolus administration of intravenous contrast. RADIATION DOSE REDUCTION: This exam was performed according to the departmental dose-optimization program which includes automated exposure control, adjustment of the mA and/or kV according to patient size and/or use of iterative reconstruction technique. CONTRAST:  75mL OMNIPAQUE IOHEXOL 350 MG/ML SOLN COMPARISON:  CT chest dated 05/17/2021, 02/26/2020 FINDINGS: CTA CHEST FINDINGS Cardiovascular: The study is high quality for the evaluation of pulmonary embolism. There are no filling defects in the central, lobar, segmental or subsegmental pulmonary artery branches to suggest acute pulmonary embolism. Great vessels are normal in course and caliber. Mild left atrial enlargement. No significant pericardial fluid/thickening. Coronary artery calcifications. Mediastinum/Nodes: Rightward shift of the mediastinum. Imaged thyroid gland without nodules meeting criteria for imaging follow-up by size.  Small hiatal hernia. No pathologically enlarged axillary, supraclavicular, mediastinal, or hilar lymph nodes. Lungs/Pleura: The central airways are patent. Near-complete atelectasis of the left lung. Posterior right apical pleural-parenchymal scarring. Left large left pneumothorax. No pleural effusion. Musculoskeletal: No acute or abnormal lytic or blastic osseous lesions. Multilevel degenerative changes of the thoracic spine. Unchanged subcutaneous density along the paramidline left upper back measures 1.5 x 1.2 cm (5:11), likely an inclusion cyst. Review of the MIP images confirms the above  findings. CT ABDOMEN and PELVIS FINDINGS Hepatobiliary: Scattered subcentimeter hypodensities, too small to characterize but unchanged from 02/26/2020. No intra or extrahepatic biliary ductal dilation. Normal gallbladder. Pancreas: No focal lesions or main ductal dilation. Spleen: Normal in size without focal abnormality. Adrenals/Urinary Tract: No adrenal nodules. No suspicious renal mass, calculi, or hydronephrosis. Bilateral hypodensities measuring up to 1.3 cm, likely simple/minimally complicated cysts. No specific follow-up imaging recommended. No focal bladder wall thickening. Stomach/Bowel: Normal appearance of the stomach. No evidence of bowel wall thickening, distention, or inflammatory changes. Colonic diverticulosis without acute diverticulitis. Normal appendix. Vascular/Lymphatic: Aortic atherosclerosis. No enlarged abdominal or pelvic lymph nodes. Reproductive: Enlargement of the prostate with median lobe hypertrophy. Other: No free fluid, fluid collection, or free air. Musculoskeletal: No acute or abnormal lytic or blastic osseous lesions. 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 Multilevel degenerative changes of the lumbar spine. Age indeterminate superior endplate compression of L1. IMPRESSION: 1. No evidence of pulmonary embolism. 2. Large left pneumothorax with rightward shift of the mediastinum and near-complete atelectasis of the left lung. 3. No acute findings in the abdomen or pelvis. 4. Colonic diverticulosis without acute diverticulitis. 5. Enlargement of the prostate with median lobe hypertrophy. 6.  Aortic Atherosclerosis (ICD10-I70.0). Critical Value/emergent results were called by telephone at the time of interpretation on 08/22/2022 at 3:05 pm to provider Gloris Manchester , who verbally acknowledged these results. Electronically Signed   By: Agustin Cree M.D.   On: 08/22/2022 15:19    EKG: Vent. rate 81 BPM PR interval 152 ms QRS duration 74 ms QT/QTcB 354/411  ms P-R-T axes 70 83 19 Normal sinus rhythm Low voltage QRS Nonspecific T wave abnormality Abnormal ECG   Assessment & Plan:    Principal Problem:   Pneumothorax Active Problems:   Abnormal glucose (prediabetes)   Essential hypertension   Mixed hyperlipidemia   Esophageal reflux   COPD (chronic obstructive pulmonary disease) (HCC)   Aortic atherosclerosis (HCC)   Hyperthyroidism   Tobacco dependence    Spontaneous left-sided pneumothorax -Most likely in the setting of history of smoking, and COPD. -Chest tube inserted in ED, continue with as needed pain medications. -Chest tube management per PCCM, current recommendation to maintain chest tube to 20 cc suction until air leak resolves -Team chest tube nursing flushes/management -Repeat chest tube in AM. - Continue with as needed pain meds  COPD -No active wheezing currently, continue with as needed albuterol -Home Trelegy, will keep on Breo and Incruse  Tobacco abuse -He was counseled, will keep on nicotine patch  History of CAD - Resume statin, will resume aspirin when more stable  GERD -Continue with Pepcid and Protonix  Hyperlipidemia -Continue with statin  Hypertension -Blood pressure acceptable, he took his meds for today already, will resume from tomorrow, and will add as needed hydralazine  Diabetes mellitus - Controlled with diet, will check A1c, and will monitor CBGs.   DVT Prophylaxis Heparin   AM Labs Ordered, also please review Full Orders  Family Communication: Admission, patients condition and  plan of care including tests being ordered have been discussed with the patient  who indicate understanding and agree with the plan and Code Status.  Code Status full  Likely DC to  home  Condition GUARDED    Consults called: PCCM    Admission status: inpatient    Time spent in minutes : 70 minutes   Huey Bienenstock M.D on 08/22/2022 at 4:21 PM   [facility] - Office   [phone]

## 2022-08-23 ENCOUNTER — Inpatient Hospital Stay: Admission: RE | Admit: 2022-08-23 | Payer: Medicare HMO | Source: Ambulatory Visit

## 2022-08-23 ENCOUNTER — Inpatient Hospital Stay (HOSPITAL_COMMUNITY): Payer: Medicare HMO

## 2022-08-23 DIAGNOSIS — J939 Pneumothorax, unspecified: Secondary | ICD-10-CM

## 2022-08-23 DIAGNOSIS — J449 Chronic obstructive pulmonary disease, unspecified: Secondary | ICD-10-CM

## 2022-08-23 DIAGNOSIS — J438 Other emphysema: Secondary | ICD-10-CM

## 2022-08-23 DIAGNOSIS — J9311 Primary spontaneous pneumothorax: Secondary | ICD-10-CM | POA: Diagnosis not present

## 2022-08-23 LAB — CBC
HCT: 41.3 % (ref 39.0–52.0)
Hemoglobin: 13.8 g/dL (ref 13.0–17.0)
MCH: 32.3 pg (ref 26.0–34.0)
MCHC: 33.4 g/dL (ref 30.0–36.0)
MCV: 96.7 fL (ref 80.0–100.0)
Platelets: 248 10*3/uL (ref 150–400)
RBC: 4.27 MIL/uL (ref 4.22–5.81)
RDW: 13 % (ref 11.5–15.5)
WBC: 8.7 10*3/uL (ref 4.0–10.5)
nRBC: 0 % (ref 0.0–0.2)

## 2022-08-23 LAB — URINALYSIS, ROUTINE W REFLEX MICROSCOPIC
Bilirubin Urine: NEGATIVE
Glucose, UA: NEGATIVE mg/dL
Hgb urine dipstick: NEGATIVE
Ketones, ur: NEGATIVE mg/dL
Leukocytes,Ua: NEGATIVE
Nitrite: NEGATIVE
Protein, ur: NEGATIVE mg/dL
Specific Gravity, Urine: 1.034 — ABNORMAL HIGH (ref 1.005–1.030)
pH: 5 (ref 5.0–8.0)

## 2022-08-23 LAB — BASIC METABOLIC PANEL
Anion gap: 9 (ref 5–15)
BUN: 12 mg/dL (ref 8–23)
CO2: 25 mmol/L (ref 22–32)
Calcium: 9.2 mg/dL (ref 8.9–10.3)
Chloride: 104 mmol/L (ref 98–111)
Creatinine, Ser: 1.06 mg/dL (ref 0.61–1.24)
GFR, Estimated: >60 mL/min (ref >60)
Glucose, Bld: 147 mg/dL — ABNORMAL HIGH (ref 70–99)
Potassium: 4.2 mmol/L (ref 3.5–5.1)
Sodium: 138 mmol/L (ref 135–145)

## 2022-08-23 LAB — GLUCOSE, CAPILLARY
Glucose-Capillary: 135 mg/dL — ABNORMAL HIGH (ref 70–99)
Glucose-Capillary: 147 mg/dL — ABNORMAL HIGH (ref 70–99)
Glucose-Capillary: 140 mg/dL — ABNORMAL HIGH (ref 70–99)

## 2022-08-23 MED ORDER — LOSARTAN POTASSIUM 50 MG PO TABS
50.0000 mg | ORAL_TABLET | Freq: Every day | ORAL | Status: DC
  Administered 2022-08-23 – 2022-08-25 (×3): 50 mg via ORAL
  Filled 2022-08-23 (×4): qty 1

## 2022-08-23 MED ORDER — POLYETHYLENE GLYCOL 3350 17 G PO PACK: 17.0000 g | PACK | Freq: Every day | ORAL | Status: DC | PRN

## 2022-08-23 MED ORDER — DOCUSATE SODIUM 100 MG PO CAPS
100.0000 mg | ORAL_CAPSULE | Freq: Two times a day (BID) | ORAL | Status: DC
  Administered 2022-08-23 – 2022-08-31 (×12): 100 mg via ORAL
  Filled 2022-08-23 (×13): qty 1

## 2022-08-23 MED ORDER — DILTIAZEM HCL ER COATED BEADS 180 MG PO CP24
180.0000 mg | ORAL_CAPSULE | Freq: Every day | ORAL | Status: DC
  Administered 2022-08-24 – 2022-08-25 (×2): 180 mg via ORAL
  Filled 2022-08-23 (×2): qty 1

## 2022-08-23 NOTE — Consult Note (Addendum)
[address]       [place] [address]             [phone]        [facility] Medical Record #[id number] Date of Birth: March 01, 1946  Referring: No ref. provider found Primary Care: [name], MD Primary Cardiologist:None  Chief Complaint:    Chief Complaint  Patient presents with   Abdominal Pain    History of Present Illness:     Mr. Schaab is a 77 year old male with a past medical history of COPD, HTN, HLD, aortic atherosclerosis, DM, and tobacco abuse. He presented to the ED on 05/29 due to bloating and epigastric pain that started at 4AM and happened intermittently throughout the day. The pain radiated to his lower left chest. He denied trauma, fever and chills but did admit to dyspnea and wheezing. He has a chronic productive cough due to COPD. On arrival to the ED Chest CT abdomen/pelvis and CTA were performed and showed a large left pneumothorax with rightward shift of the mediastinum and near complete atelectasis of the left lung, there was no sign of pulmonary embolism. Chest tube was placed in the ED and follow up CXR showed decreased moderate left pneumothorax with pleural catheter in place and interval re-expansion of the left lung. He has no history of prior pneumothoraces.     Current Activity/ Functional Status: Patient is independent with mobility/ambulation, transfers, ADL's, IADL's.   Zubrod Score: At the time of surgery this patient's most appropriate activity status/level should be described as: []     0    Normal activity, no symptoms [x]     1    Restricted in physical strenuous activity but ambulatory, able to do out light work []     2    Ambulatory and capable of self care, unable to do work activities, up and about                 more than 50%  Of the time                            []     3    Only limited self care, in bed greater than 50% of waking hours []     4    Completely disabled, no self care, confined to  bed or chair []     5    Moribund  Past Medical History:  Diagnosis Date   Diabetes mellitus without complication (HCC)    Hyperlipidemia    Hypertension    Vitamin D deficiency     Past Surgical History:  Procedure Laterality Date   CATARACT EXTRACTION, BILATERAL Bilateral 2018   Dr. Wayland Denis    Social History   Tobacco Use  Smoking Status Former   Packs/day: 1.00   Years: 44.00   Additional pack years: 0.00   Total pack years: 44.00   Types: Cigarettes   Start date: 94   Quit date: 05/30/2022   Years since quitting: 0.2  Smokeless Tobacco Never    Social History   Substance and Sexual Activity  Alcohol Use No   Alcohol/week: 0.0 standard drinks of alcohol   Comment: He states he no longer drinks alcohol     Allergies  Allergen Reactions   Ppd [Tuberculin Purified Protein Derivative]     Positive PPD.    Current Facility-Administered Medications  Medication Dose Route Frequency Provider  Last Rate Last Admin   acetaminophen (TYLENOL) tablet 650 mg  650 mg Oral Q6H PRN Elgergawy, Leana Roe, MD       Or   acetaminophen (TYLENOL) suppository 650 mg  650 mg Rectal Q6H PRN Elgergawy, Leana Roe, MD       albuterol (PROVENTIL) (2.5 MG/3ML) 0.083% nebulizer solution 2.5 mg  2.5 mg Nebulization Q2H PRN Elgergawy, Leana Roe, MD   2.5 mg at 08/22/22 2032   albuterol (VENTOLIN HFA) 108 (90 Base) MCG/ACT inhaler 2 puff  2 puff Inhalation Q6H PRN Desai, Rahul P, PA-C       alum & mag hydroxide-simeth (MAALOX/MYLANTA) 200-200-20 MG/5ML suspension 30 mL  30 mL Oral Q4H PRN Elgergawy, Leana Roe, MD   30 mL at 08/23/22 0841   [START ON 08/24/2022] diltiazem (CARDIZEM CD) 24 hr capsule 180 mg  180 mg Oral Daily Elgergawy, Leana Roe, MD       docusate sodium (COLACE) capsule 100 mg  100 mg Oral BID Elgergawy, Leana Roe, MD   100 mg at 08/23/22 1123   fluticasone furoate-vilanterol (BREO ELLIPTA) 200-25 MCG/ACT 1 puff  1 puff Inhalation Daily Desai, Rahul P, PA-C   1 puff at 08/23/22  0842   And   umeclidinium bromide (INCRUSE ELLIPTA) 62.5 MCG/ACT 1 puff  1 puff Inhalation Daily Desai, Rahul P, PA-C   1 puff at 08/23/22 0842   heparin injection 5,000 Units  5,000 Units Subcutaneous Q8H Elgergawy, Leana Roe, MD   5,000 Units at 08/23/22 1610   hydrALAZINE (APRESOLINE) injection 5 mg  5 mg Intravenous Q4H PRN Elgergawy, Leana Roe, MD       ipratropium-albuterol (DUONEB) 0.5-2.5 (3) MG/3ML nebulizer solution 3 mL  3 mL Nebulization Q6H PRN Desai, Rahul P, PA-C       [START ON 08/24/2022] losartan (COZAAR) tablet 50 mg  50 mg Oral Daily Elgergawy, Leana Roe, MD   50 mg at 08/23/22 1000   montelukast (SINGULAIR) tablet 10 mg  10 mg Oral Daily Elgergawy, Leana Roe, MD   10 mg at 08/23/22 1045   morphine (PF) 2 MG/ML injection 2 mg  2 mg Intravenous Q4H PRN Elgergawy, Leana Roe, MD   2 mg at 08/23/22 1046   nicotine (NICODERM CQ - dosed in mg/24 hours) patch 21 mg  21 mg Transdermal Daily Elgergawy, Leana Roe, MD   21 mg at 08/23/22 1044   ondansetron (ZOFRAN) tablet 4 mg  4 mg Oral Q6H PRN Elgergawy, Leana Roe, MD       Or   ondansetron (ZOFRAN) injection 4 mg  4 mg Intravenous Q6H PRN Elgergawy, Leana Roe, MD   4 mg at 08/22/22 1938   oxyCODONE (Oxy IR/ROXICODONE) immediate release tablet 5 mg  5 mg Oral Q4H PRN Elgergawy, Leana Roe, MD       pantoprazole (PROTONIX) EC tablet 20 mg  20 mg Oral Daily Elgergawy, Leana Roe, MD       polyethylene glycol (MIRALAX / GLYCOLAX) packet 17 g  17 g Oral Daily PRN Elgergawy, Leana Roe, MD       rosuvastatin (CRESTOR) tablet 40 mg  40 mg Oral Daily Elgergawy, Leana Roe, MD   40 mg at 08/23/22 1044   sodium chloride flush (NS) 0.9 % injection 10 mL  10 mL Intrapleural Q8H Elgergawy, Leana Roe, MD   10 mL at 08/23/22 0029    Medications Prior to Admission  Medication Sig Dispense Refill Last Dose   aspirin 81 MG tablet Take 81 mg  by mouth daily.   08/22/2022   cetirizine (ZYRTEC ALLERGY) 10 MG tablet Take one tablet nightly for allergies. 90 tablet 1  08/22/2022   Cholecalciferol (VITAMIN D) 2000 UNITS CAPS Take 2 capsules by mouth daily.    08/22/2022   diltiazem (DILACOR XR) 240 MG 24 hr capsule TAKE 1 CAPSULE DAILY FOR BLOOD PRESSURE AND HEART RHYTHM 90 capsule 3 08/22/2022   famotidine (PEPCID) 40 MG tablet Take 1 tablet (40 mg total) by mouth 2 (two) times daily. 90 tablet 3 08/22/2022   losartan (COZAAR) 100 MG tablet TAKE 1 TABLET EVERY DAY FOR BLOOD PRESSURE (Patient taking differently: Take 100 mg by mouth daily. TAKE 1 TABLET EVERY DAY FOR BLOOD PRESSURE) 90 tablet 3 08/22/2022   magnesium gluconate (MAGONATE) 500 MG tablet Take 500 mg by mouth daily.    08/22/2022   metoCLOPramide (REGLAN) 10 MG tablet Take  1 tablet  3 x/ day  with Lunch, Supper & Bedtime for Acid Indigestion & Reflux. 90 tablet 1 08/22/2022   montelukast (SINGULAIR) 10 MG tablet Take 1 tablet (10 mg total) by mouth daily. 30 tablet 2 08/22/2022   pantoprazole (PROTONIX) 40 MG tablet Take  1 tablet  2 x /day  to Prevent Indigestion & Heartburn 180 tablet 3 08/22/2022   rosuvastatin (CRESTOR) 40 MG tablet TAKE 1 TABLET DAILY FOR CHOLESTEROL 90 tablet 3 08/22/2022   zinc gluconate 50 MG tablet Take 50 mg by mouth daily.   08/22/2022   ACCU-CHEK SOFTCLIX LANCETS lancets Check blood sugar 1 time daily-DX-R73.09 100 each 3    albuterol (VENTOLIN HFA) 108 (90 Base) MCG/ACT inhaler 2 inhalations 15 minutes apart every 4 hours to Rescue Asthma 48 g 3 unknown   Alcohol Swabs (B-D SINGLE USE SWABS REGULAR) PADS Check blood sugar 1 time daily-DX-R73.09. 100 each 3    Blood Glucose Calibration (ACCU-CHEK AVIVA) SOLN Use per box instruction. 1 each 3    blood glucose meter kit and supplies KIT Test blood sugar daily or as directed 1 each 0    Blood Glucose Monitoring Suppl (ACCU-CHEK AVIVA PLUS) w/Device KIT Check blood sugar 1 time daily-DX-R73.09 1 kit 0    fluticasone (FLONASE) 50 MCG/ACT nasal spray 2 sprays each nostril daily 16 g 2 unknown   Fluticasone-Umeclidin-Vilant (TRELEGY  ELLIPTA) 200-62.5-25 MCG/ACT AEPB Use  1 Inhalation  Daily  for COPD /Asthma 180 each 3 unknown   glucose blood (ACCU-CHEK AVIVA PLUS) test strip Check blood sugar 1 time daily-R73.09. 100 each 3    ipratropium-albuterol (DUONEB) 0.5-2.5 (3) MG/3ML SOLN USE 1 VIAL IN NEBULIZER EVERY 4 TO 6 HOURS - As Needed 180 mL 11 unknown    Family History  Problem Relation Age of Onset   Heart attack Mother    Heart disease Mother    Heart attack Brother    Hypertension Brother    Diabetes Sister    Heart disease Sister    Heart disease Father    Heart attack Father      Review of Systems:   Review of Systems  Constitutional:  Negative for chills, diaphoresis, fever and malaise/fatigue.  Respiratory:  Positive for cough, sputum production, shortness of breath and wheezing. Negative for hemoptysis.   Cardiovascular:  Negative for chest pain, palpitations and orthopnea.  Gastrointestinal:  Positive for abdominal pain. Negative for diarrhea, nausea and vomiting.  Genitourinary:  Negative for dysuria.  Neurological:  Negative for dizziness and weakness.  Endo/Heme/Allergies:  Bruises/bleeds easily.  Psychiatric/Behavioral:  Negative for depression. The patient  is not nervous/anxious.      Physical Exam: BP 120/61 (BP Location: Right Arm)   Pulse 76   Temp 98.3 F (36.8 C) (Oral)   Resp 20   SpO2 91%  General appearance: alert, cooperative, and no distress Neck: no carotid bruit, no JVD, and supple, symmetrical, trachea midline Resp: Diminished left sided breath sounds Cardio: regular rate and rhythm, S1, S2 normal, no murmur, click, rub or gallop GI: soft, non-tender; bowel sounds normal; no masses,  no organomegaly Extremities: no edema, no redness or swelling, SCDs in place Neurologic: Grossly normal  Diagnostic Studies & Radiology Findings:  CLINICAL DATA:  Epigastric pressure associated with abdominal distention   EXAM: CT ANGIOGRAPHY CHEST   CT ABDOMEN AND PELVIS WITH  CONTRAST   TECHNIQUE: Multidetector CT imaging of the chest was performed using the standard protocol during bolus administration of intravenous contrast. Multiplanar CT image reconstructions and MIPs were obtained to evaluate the vascular anatomy. Multidetector CT imaging of the abdomen and pelvis was performed using the standard protocol during bolus administration of intravenous contrast.   RADIATION DOSE REDUCTION: This exam was performed according to the departmental dose-optimization program which includes automated exposure control, adjustment of the mA and/or kV according to patient size and/or use of iterative reconstruction technique.   CONTRAST:  75mL OMNIPAQUE IOHEXOL 350 MG/ML SOLN   COMPARISON:  CT chest dated 05/17/2021, 02/26/2020   FINDINGS: CTA CHEST FINDINGS   Cardiovascular: The study is high quality for the evaluation of pulmonary embolism. There are no filling defects in the central, lobar, segmental or subsegmental pulmonary artery branches to suggest acute pulmonary embolism. Great vessels are normal in course and caliber. Mild left atrial enlargement. No significant pericardial fluid/thickening. Coronary artery calcifications.   Mediastinum/Nodes: Rightward shift of the mediastinum. Imaged thyroid gland without nodules meeting criteria for imaging follow-up by size. Small hiatal hernia. No pathologically enlarged axillary, supraclavicular, mediastinal, or hilar lymph nodes.   Lungs/Pleura: The central airways are patent. Near-complete atelectasis of the left lung. Posterior right apical pleural-parenchymal scarring. Left large left pneumothorax. No pleural effusion.   Musculoskeletal: No acute or abnormal lytic or blastic osseous lesions. Multilevel degenerative changes of the thoracic spine. Unchanged subcutaneous density along the paramidline left upper back measures 1.5 x 1.2 cm (5:11), likely an inclusion cyst.   Review of the MIP images  confirms the above findings.   CT ABDOMEN and PELVIS FINDINGS   Hepatobiliary: Scattered subcentimeter hypodensities, too small to characterize but unchanged from 02/26/2020. No intra or extrahepatic biliary ductal dilation. Normal gallbladder.   Pancreas: No focal lesions or main ductal dilation.   Spleen: Normal in size without focal abnormality.   Adrenals/Urinary Tract: No adrenal nodules. No suspicious renal mass, calculi, or hydronephrosis. Bilateral hypodensities measuring up to 1.3 cm, likely simple/minimally complicated cysts. No specific follow-up imaging recommended. No focal bladder wall thickening.   Stomach/Bowel: Normal appearance of the stomach. No evidence of bowel wall thickening, distention, or inflammatory changes. Colonic diverticulosis without acute diverticulitis. Normal appendix.   Vascular/Lymphatic: Aortic atherosclerosis. No enlarged abdominal or pelvic lymph nodes.   Reproductive: Enlargement of the prostate with median lobe hypertrophy.   Other: No free fluid, fluid collection, or free air.   Musculoskeletal: No acute or abnormal lytic or blastic osseous lesions. 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 Multilevel degenerative changes of the lumbar spine. Age indeterminate superior endplate compression of L1.   IMPRESSION: 1. No evidence of pulmonary embolism. 2. Large left pneumothorax with rightward shift  of the mediastinum and near-complete atelectasis of the left lung. 3. No acute findings in the abdomen or pelvis. 4. Colonic diverticulosis without acute diverticulitis. 5. Enlargement of the prostate with median lobe hypertrophy. 6.  Aortic Atherosclerosis (ICD10-I70.0).   Critical Value/emergent results were called by telephone at the time of interpretation on 08/22/2022 at 3:05 pm to provider Gloris Manchester , who verbally acknowledged these results.     Electronically Signed   By: Agustin Cree M.D.   On: 08/22/2022  15:19  CLINICAL DATA:  Pneumothorax status post chest tube placement   EXAM: PORTABLE CHEST 1 VIEW   COMPARISON:  CTA chest dated 08/22/2022, chest radiograph dated 05/15/2021   FINDINGS: Lines/tubes: Left inferolateral approach pleural catheter tip projects over the left hilum.   Lungs: Interval re-expansion of the left lung with patchy left basilar opacities.   Pleura: Moderate left pneumothorax.   Heart/mediastinum: The heart size and mediastinal contours are within normal limits.   Bones: No acute osseous abnormality. Mild left chest wall subcutaneous emphysema.   IMPRESSION: 1. Decreased moderate left pneumothorax with left inferolateral approach pleural catheter in place. 2. Interval re-expansion of the left lung with patchy left basilar opacities, likely atelectasis.     Electronically Signed   By: Agustin Cree M.D.   On: 08/22/2022 15:55  Assessment and Plan:  Spontaneous left sided pneumothorax: Chest tube in place with air leak, continue chest tube to suction. 1st spontaneous pneumothorax but with COPD and blebs on CT can monitor with chest tube vs VATS. Dr. Cliffton Asters to ultimately determine candidacy and timing for VATS surgery.   Tobacco abuse:  Quit once in 2021 for a couple months, smokes a cigarette every once in a while now. Counseled on cessation.  COPD: Continue Breo and Incruse  HTN: On Diltiazem and Losartan  HLD: On rosuvastatin  DM: A1C 6.6, no medications   Jenny Reichmann, PA-C   Agree with above Left pneumoT with bullous emphysema.  We discussed the risks and benefits of wedge resection, and pleurodesis.  Despite this being his 1st episode, I think that with the bullous disease, he will reduce his risk of recurrence with resection.  Justin Meisenheimer Keane Scrape

## 2022-08-23 NOTE — Progress Notes (Signed)
PROGRESS NOTE    Jesus Bush  MWN:[id number] DOB: November 05, 1945 DOA: 08/22/2022 PCP: [name], MD    Chief Complaint  Patient presents with   Abdominal Pain    Brief Narrative:   Jesus Bush  is a 77 y.o. male, with past medical history of active tobacco abuse, COPD, hypertension, hyperlipidemia, aortic atherosclerosis, diabetes mellitus, patient presents to ED secondary to complaints of bloating, epigastric pain, reports pain has started earlier this morning around 4 AM, has been intermittent, resolved with breakfast, but happened again late a.m., was radiating to his abdomen and lower chest, and he does report some dyspnea, denies fever, chills.  He denies any fall, no trauma. -NAD patient had CT chest/abdomen, and CTA chest, which did show large left-sided pneumothorax, so chest tube has been inserted by ED physician, with follow-up chest x-ray showing mental and near reexpansion of the left lung, he was admitted for further management.  Assessment & Plan:   Principal Problem:   Pneumothorax Active Problems:   Abnormal glucose (prediabetes)   Essential hypertension   Mixed hyperlipidemia   Esophageal reflux   COPD (chronic obstructive pulmonary disease) (HCC)   Aortic atherosclerosis (HCC)   Hyperthyroidism   Tobacco dependence   Spontaneous left-sided pneumothorax -Most likely in the setting of history of smoking, and COPD. -Chest tube management per PCCM. -Continue with as needed pain medications -CT surgery consulted to consider VATS pleurodesis per PCCM recommendation.  COPD -Continue with Breo and Incruse -No active wheezing, continue with as needed nebs   Tobacco abuse -He was counseled, will keep on nicotine patch   History of CAD - Resume statin, will resume aspirin when more stable   GERD -Continue with Pepcid and Protonix   Hyperlipidemia -Continue with statin   Hypertension -Continue with home medications and as needed nebs   Diabetes  mellitus - Controlled with diet, will check A1c, and will monitor CBGs.     DVT prophylaxis: Heparin Code Status: Full Family Communication: None at bedside Disposition:   Status is: Inpatient    Consultants:  PCCM CT surgery   Subjective:  No significant events overnight as discussed with staff, patient denies any complaints today  Objective: Vitals:   08/23/22 0000 08/23/22 0400 08/23/22 0803 08/23/22 0842  BP: 104/64 114/61 120/68   Pulse: 77 80 79   Resp: 18 17 20    Temp: 98.5 F (36.9 C) 98.3 F (36.8 C) 98 F (36.7 C)   TempSrc: Oral Oral Oral   SpO2: 93% 95% 94% 94%    Intake/Output Summary (Last 24 hours) at 08/23/2022 1026 Last data filed at 08/23/2022 0620 Gross per 24 hour  Intake --  Output 700 ml  Net -700 ml   There were no vitals filed for this visit.  Examination:  Awake Alert, Oriented X 3, No new F.N deficits, Normal affect Symmetrical Chest wall movement, left lung chest tube present, Rales at the bases RRR,No Gallops,Rubs or new Murmurs, No Parasternal Heave +ve B.Sounds, Abd Soft, No tenderness, No rebound - guarding or rigidity. No Cyanosis, Clubbing or edema, No new Rash or bruise       Data Reviewed: I have personally reviewed following labs and imaging studies  CBC: Recent Labs  Lab 08/22/22 1248 08/23/22 0423  WBC 10.5 8.7  NEUTROABS 7.6  --   HGB 14.8 13.8  HCT 44.8 41.3  MCV 94.7 96.7  PLT 252 248    Basic Metabolic Panel: Recent Labs  Lab 08/22/22 1248 08/23/22 0423  NA  139 138  K 4.0 4.2  CL 102 104  CO2 26 25  GLUCOSE 147* 147*  BUN 11 12  CREATININE 1.31* 1.06  CALCIUM 9.7 9.2    GFR: CrCl cannot be calculated (Unknown ideal weight.).  Liver Function Tests: Recent Labs  Lab 08/22/22 1248  AST 43*  ALT 42  ALKPHOS 81  BILITOT 0.5  PROT 6.9  ALBUMIN 3.8    CBG: Recent Labs  Lab 08/22/22 1758 08/23/22 0802  GLUCAP 116* 135*     No results found for this or any previous visit (from  the past 240 hour(s)).       Radiology Studies: DG Chest Portable 1 View  Result Date: 08/22/2022 CLINICAL DATA:  Pneumothorax status post chest tube placement EXAM: PORTABLE CHEST 1 VIEW COMPARISON:  CTA chest dated 08/22/2022, chest radiograph dated 05/15/2021 FINDINGS: Lines/tubes: Left inferolateral approach pleural catheter tip projects over the left hilum. Lungs: Interval re-expansion of the left lung with patchy left basilar opacities. Pleura: Moderate left pneumothorax. Heart/mediastinum: The heart size and mediastinal contours are within normal limits. Bones: No acute osseous abnormality. Mild left chest wall subcutaneous emphysema. IMPRESSION: 1. Decreased moderate left pneumothorax with left inferolateral approach pleural catheter in place. 2. Interval re-expansion of the left lung with patchy left basilar opacities, likely atelectasis. Electronically Signed   By: Agustin Cree M.D.   On: 08/22/2022 15:55   CT ABDOMEN PELVIS W CONTRAST  Result Date: 08/22/2022 CLINICAL DATA:  Epigastric pressure associated with abdominal distention EXAM: CT ANGIOGRAPHY CHEST CT ABDOMEN AND PELVIS WITH CONTRAST TECHNIQUE: Multidetector CT imaging of the chest was performed using the standard protocol during bolus administration of intravenous contrast. Multiplanar CT image reconstructions and MIPs were obtained to evaluate the vascular anatomy. Multidetector CT imaging of the abdomen and pelvis was performed using the standard protocol during bolus administration of intravenous contrast. RADIATION DOSE REDUCTION: This exam was performed according to the departmental dose-optimization program which includes automated exposure control, adjustment of the mA and/or kV according to patient size and/or use of iterative reconstruction technique. CONTRAST:  75mL OMNIPAQUE IOHEXOL 350 MG/ML SOLN COMPARISON:  CT chest dated 05/17/2021, 02/26/2020 FINDINGS: CTA CHEST FINDINGS Cardiovascular: The study is high quality for the  evaluation of pulmonary embolism. There are no filling defects in the central, lobar, segmental or subsegmental pulmonary artery branches to suggest acute pulmonary embolism. Great vessels are normal in course and caliber. Mild left atrial enlargement. No significant pericardial fluid/thickening. Coronary artery calcifications. Mediastinum/Nodes: Rightward shift of the mediastinum. Imaged thyroid gland without nodules meeting criteria for imaging follow-up by size. Small hiatal hernia. No pathologically enlarged axillary, supraclavicular, mediastinal, or hilar lymph nodes. Lungs/Pleura: The central airways are patent. Near-complete atelectasis of the left lung. Posterior right apical pleural-parenchymal scarring. Left large left pneumothorax. No pleural effusion. Musculoskeletal: No acute or abnormal lytic or blastic osseous lesions. Multilevel degenerative changes of the thoracic spine. Unchanged subcutaneous density along the paramidline left upper back measures 1.5 x 1.2 cm (5:11), likely an inclusion cyst. Review of the MIP images confirms the above findings. CT ABDOMEN and PELVIS FINDINGS Hepatobiliary: Scattered subcentimeter hypodensities, too small to characterize but unchanged from 02/26/2020. No intra or extrahepatic biliary ductal dilation. Normal gallbladder. Pancreas: No focal lesions or main ductal dilation. Spleen: Normal in size without focal abnormality. Adrenals/Urinary Tract: No adrenal nodules. No suspicious renal mass, calculi, or hydronephrosis. Bilateral hypodensities measuring up to 1.3 cm, likely simple/minimally complicated cysts. No specific follow-up imaging recommended. No focal bladder wall thickening. Stomach/Bowel:  Normal appearance of the stomach. No evidence of bowel wall thickening, distention, or inflammatory changes. Colonic diverticulosis without acute diverticulitis. Normal appendix. Vascular/Lymphatic: Aortic atherosclerosis. No enlarged abdominal or pelvic lymph nodes.  Reproductive: Enlargement of the prostate with median lobe hypertrophy. Other: No free fluid, fluid collection, or free air. Musculoskeletal: No acute or abnormal lytic or blastic osseous lesions. 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 Multilevel degenerative changes of the lumbar spine. Age indeterminate superior endplate compression of L1. IMPRESSION: 1. No evidence of pulmonary embolism. 2. Large left pneumothorax with rightward shift of the mediastinum and near-complete atelectasis of the left lung. 3. No acute findings in the abdomen or pelvis. 4. Colonic diverticulosis without acute diverticulitis. 5. Enlargement of the prostate with median lobe hypertrophy. 6.  Aortic Atherosclerosis (ICD10-I70.0). Critical Value/emergent results were called by telephone at the time of interpretation on 08/22/2022 at 3:05 pm to provider Gloris Manchester , who verbally acknowledged these results. Electronically Signed   By: Agustin Cree M.D.   On: 08/22/2022 15:19   CT Angio Chest PE W and/or Wo Contrast  Result Date: 08/22/2022 CLINICAL DATA:  Epigastric pressure associated with abdominal distention EXAM: CT ANGIOGRAPHY CHEST CT ABDOMEN AND PELVIS WITH CONTRAST TECHNIQUE: Multidetector CT imaging of the chest was performed using the standard protocol during bolus administration of intravenous contrast. Multiplanar CT image reconstructions and MIPs were obtained to evaluate the vascular anatomy. Multidetector CT imaging of the abdomen and pelvis was performed using the standard protocol during bolus administration of intravenous contrast. RADIATION DOSE REDUCTION: This exam was performed according to the departmental dose-optimization program which includes automated exposure control, adjustment of the mA and/or kV according to patient size and/or use of iterative reconstruction technique. CONTRAST:  75mL OMNIPAQUE IOHEXOL 350 MG/ML SOLN COMPARISON:  CT chest dated 05/17/2021, 02/26/2020 FINDINGS: CTA CHEST  FINDINGS Cardiovascular: The study is high quality for the evaluation of pulmonary embolism. There are no filling defects in the central, lobar, segmental or subsegmental pulmonary artery branches to suggest acute pulmonary embolism. Great vessels are normal in course and caliber. Mild left atrial enlargement. No significant pericardial fluid/thickening. Coronary artery calcifications. Mediastinum/Nodes: Rightward shift of the mediastinum. Imaged thyroid gland without nodules meeting criteria for imaging follow-up by size. Small hiatal hernia. No pathologically enlarged axillary, supraclavicular, mediastinal, or hilar lymph nodes. Lungs/Pleura: The central airways are patent. Near-complete atelectasis of the left lung. Posterior right apical pleural-parenchymal scarring. Left large left pneumothorax. No pleural effusion. Musculoskeletal: No acute or abnormal lytic or blastic osseous lesions. Multilevel degenerative changes of the thoracic spine. Unchanged subcutaneous density along the paramidline left upper back measures 1.5 x 1.2 cm (5:11), likely an inclusion cyst. Review of the MIP images confirms the above findings. CT ABDOMEN and PELVIS FINDINGS Hepatobiliary: Scattered subcentimeter hypodensities, too small to characterize but unchanged from 02/26/2020. No intra or extrahepatic biliary ductal dilation. Normal gallbladder. Pancreas: No focal lesions or main ductal dilation. Spleen: Normal in size without focal abnormality. Adrenals/Urinary Tract: No adrenal nodules. No suspicious renal mass, calculi, or hydronephrosis. Bilateral hypodensities measuring up to 1.3 cm, likely simple/minimally complicated cysts. No specific follow-up imaging recommended. No focal bladder wall thickening. Stomach/Bowel: Normal appearance of the stomach. No evidence of bowel wall thickening, distention, or inflammatory changes. Colonic diverticulosis without acute diverticulitis. Normal appendix. Vascular/Lymphatic: Aortic  atherosclerosis. No enlarged abdominal or pelvic lymph nodes. Reproductive: Enlargement of the prostate with median lobe hypertrophy. Other: No free fluid, fluid collection, or free air. Musculoskeletal: No acute or abnormal lytic or  blastic osseous lesions. 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 Multilevel degenerative changes of the lumbar spine. Age indeterminate superior endplate compression of L1. IMPRESSION: 1. No evidence of pulmonary embolism. 2. Large left pneumothorax with rightward shift of the mediastinum and near-complete atelectasis of the left lung. 3. No acute findings in the abdomen or pelvis. 4. Colonic diverticulosis without acute diverticulitis. 5. Enlargement of the prostate with median lobe hypertrophy. 6.  Aortic Atherosclerosis (ICD10-I70.0). Critical Value/emergent results were called by telephone at the time of interpretation on 08/22/2022 at 3:05 pm to provider Gloris Manchester , who verbally acknowledged these results. Electronically Signed   By: Agustin Cree M.D.   On: 08/22/2022 15:19        Scheduled Meds:  diltiazem  240 mg Oral Daily   fluticasone furoate-vilanterol  1 puff Inhalation Daily   And   umeclidinium bromide  1 puff Inhalation Daily   heparin  5,000 Units Subcutaneous Q8H   losartan  100 mg Oral Daily   montelukast  10 mg Oral Daily   nicotine  21 mg Transdermal Daily   pantoprazole  20 mg Oral Daily   rosuvastatin  40 mg Oral Daily   sodium chloride flush  10 mL Intrapleural Q8H   Continuous Infusions:   LOS: 1 day      Huey Bienenstock, MD [facility]   To contact the attending provider between 7A-7P or the covering provider during after hours 7P-7A, please log into the web site [email or web address] and access using universal [place] password for that web site. If you do not have the password, please call the hospital operator.  08/23/2022, 10:26 AM

## 2022-08-23 NOTE — TOC Initial Note (Signed)
Transition of Care [facility]) - Initial/Assessment Note    Patient Details  Name: Jesus Bush MRN: [id number] Date of Birth: Jul 21, 1945  Transition of Care [facility]) CM/SW Contact:    Gordy Clement, RN Phone Number: 08/23/2022, 11:28 AM  Transition of Care [facility]) - Inpatient Brief Assessment   Patient Details  Name: Jesus Bush MRN: [id number] Date of Birth: 1946/03/26  [facility] of [facility]) CM/SW Contact:    Gordy Clement, RN Phone Number: 08/23/2022, 11:28 AM   Clinical Narrative:  Patient from home with Wife. Independent PTA. Will dc after chest tube is removed    Transition of Care Asessment:   Patient has primary care physician: Yes Home environment has been reviewed: Home with Spouse Prior level of function:: Independent Prior/Current Home Services: No current home services Social Determinants of Health Reivew: SDOH reviewed no interventions necessary Readmission risk has been reviewed: Yes (10%) Transition of care needs: no transition of care needs at this time   Clinical Narrative:                         Patient Goals and CMS Choice            Expected Discharge Plan and Services                                              Prior Living Arrangements/Services                       Activities of Daily Living Home Assistive Devices/Equipment: None ADL Screening (condition at time of admission) Patient's cognitive ability adequate to safely complete daily activities?: Yes Is the patient deaf or have difficulty hearing?: Yes Does the patient have difficulty seeing, even when wearing glasses/contacts?: No Does the patient have difficulty concentrating, remembering, or making decisions?: No Patient able to express need for assistance with ADLs?: Yes Does the patient have difficulty dressing or bathing?: No Independently performs ADLs?: Yes (appropriate for developmental age) Does the patient have difficulty walking or  climbing stairs?: No Weakness of Legs: None Weakness of Arms/Hands: None  Permission Sought/Granted                  Emotional Assessment              Admission diagnosis:  Spontaneous pneumothorax [J93.83] Pneumothorax [J93.9] Patient Active Problem List   Diagnosis Date Noted   Pneumothorax 08/22/2022   Tobacco dependence 08/22/2022   Coronary atherosclerosis 09/17/2020   Sebaceous cyst 09/16/2020   Nodule of left lung 02/05/2019   Hyperthyroidism 06/28/2018   Aortic atherosclerosis (HCC) 09/09/2017   COPD (chronic obstructive pulmonary disease) (HCC) 11/08/2016   Esophageal reflux 04/25/2015   Former smoker (44 pack year hx, quit 2021) 07/23/2014   Medication management 04/13/2014   Essential hypertension 02/11/2013   Mixed hyperlipidemia 02/11/2013   Vitamin D deficiency 02/11/2013   Abnormal glucose (prediabetes) 05/12/2012   PCP:  [name], MD Pharmacy:   [facility] - [place], [place] - [address] [place] [address] Phone: [phone] Fax: [phone]  [facility] [address], [place] - [address] [place] [address] Phone: [phone] Fax: [phone]  [facility] - [place], [place] - [address] [employer] [address] [place] [address] Phone: [phone] Fax: [phone]     Social Determinants of Health (SDOH) Social History: SDOH Screenings   Food Insecurity: No Food Insecurity (08/22/2022)  Housing: Low Risk  (08/22/2022)  Transportation Needs: No Transportation Needs (08/22/2022)  Utilities: Not At Risk (08/22/2022)  Depression (PHQ2-9): Low Risk  (04/10/2022)  Tobacco Use: Medium Risk (07/18/2022)   SDOH Interventions:     Readmission Risk Interventions     No data to display

## 2022-08-24 ENCOUNTER — Inpatient Hospital Stay (HOSPITAL_COMMUNITY): Payer: Medicare HMO

## 2022-08-24 ENCOUNTER — Encounter (HOSPITAL_COMMUNITY): Payer: Self-pay | Admitting: Internal Medicine

## 2022-08-24 DIAGNOSIS — J9312 Secondary spontaneous pneumothorax: Secondary | ICD-10-CM | POA: Diagnosis not present

## 2022-08-24 DIAGNOSIS — F17200 Nicotine dependence, unspecified, uncomplicated: Secondary | ICD-10-CM | POA: Diagnosis not present

## 2022-08-24 DIAGNOSIS — J9311 Primary spontaneous pneumothorax: Secondary | ICD-10-CM | POA: Diagnosis not present

## 2022-08-24 LAB — GLUCOSE, CAPILLARY
Glucose-Capillary: 157 mg/dL — ABNORMAL HIGH (ref 70–99)
Glucose-Capillary: 122 mg/dL — ABNORMAL HIGH (ref 70–99)
Glucose-Capillary: 145 mg/dL — ABNORMAL HIGH (ref 70–99)
Glucose-Capillary: 144 mg/dL — ABNORMAL HIGH (ref 70–99)

## 2022-08-24 MED ORDER — PANTOPRAZOLE SODIUM 40 MG IV SOLR
40.0000 mg | Freq: Two times a day (BID) | INTRAVENOUS | Status: AC
  Administered 2022-08-24 – 2022-08-25 (×4): 40 mg via INTRAVENOUS
  Filled 2022-08-24 (×4): qty 10

## 2022-08-24 MED ORDER — PANTOPRAZOLE SODIUM 40 MG PO TBEC
40.0000 mg | DELAYED_RELEASE_TABLET | Freq: Two times a day (BID) | ORAL | Status: DC
  Administered 2022-08-26 – 2022-08-31 (×10): 40 mg via ORAL
  Filled 2022-08-24 (×11): qty 1

## 2022-08-24 MED ORDER — SUCRALFATE 1 GM/10ML PO SUSP
1.0000 g | Freq: Three times a day (TID) | ORAL | Status: AC
  Administered 2022-08-24 – 2022-08-26 (×8): 1 g via ORAL
  Filled 2022-08-24 (×6): qty 10

## 2022-08-24 MED ORDER — PANTOPRAZOLE SODIUM 40 MG IV SOLR: 40.0000 mg | Freq: Two times a day (BID) | INTRAVENOUS | Status: DC

## 2022-08-24 MED ORDER — POLYETHYLENE GLYCOL 3350 17 G PO PACK
17.0000 g | PACK | Freq: Two times a day (BID) | ORAL | Status: DC
  Administered 2022-08-24 – 2022-08-27 (×7): 17 g via ORAL
  Filled 2022-08-24 (×7): qty 1

## 2022-08-24 NOTE — Consult Note (Signed)
NAME:  Jesus Bush, MRN:  [id number], DOB:  September 26, 1945, LOS: 2 ADMISSION DATE:  08/22/2022, CONSULTATION DATE:  08/24/2022 REFERRING MD:  Dr. Durwin Nora, CHIEF COMPLAINT:  Abd pain  History of Present Illness:  Jesus Bush is a 77 yo male who has a PMHx of COPD (per report, no formal dx), HTN, HLD, DM who presented to [facility] ED on 5/29 with epigastric pain that originally resolved that morning but returned with radiation into upper abdomen and lower chest after breakfast with dyspnea.   ED evaluation was pertinent for CT A/P and CTA chest with large left sided pneumothorax. Received chest tube placed by EDP. Following CXR showed partial reexpansion of lung with apical pneumothorax. He was admitted and PCCM was consulted for chest tube management; anticipating VATS with pleurodesis on Monday.  Pertinent  Medical History  COPD (per report, no formal dx) HTN HLD Diabetes GERD Former smoker (44 pack year; quit 2021) Nodule of left lung Hx of CAD Hyperthyroidism  Significant Hospital Events: Including procedures, antibiotic start and stop dates in addition to other pertinent events   5/29 admitted, chest tube placed by EDP  Interim History / Subjective:  No acute events overnight. Resolution of epigastric pain since admission. Chest tube in place w/o discomfort. No output.  Able to sit upright and get out bed this AM.  Intermittently requires 2L via [place] for dips in O2 sats with conservation and exertion. Feels comfortable, no concerns. Daughter-in-law at beside.  Objective   Blood pressure (!) 139/95, pulse 100, temperature 98.1 F (36.7 C), temperature source Oral, resp. rate 20, height 5\' 10"  (1.778 m), weight 73.5 kg, SpO2 92 %.        Intake/Output Summary (Last 24 hours) at 08/24/2022 0950 Last data filed at 08/24/2022 4098 Gross per 24 hour  Intake 243 ml  Output 1025 ml  Net -782 ml   Filed Weights   08/24/22 0916  Weight: 73.5 kg    Examination: General: well-appearing  77 yo male, NAD, sitting upright in chair, conversant HEENT: NCAT, PERRL, no LAD, nasal cannula on 2L  Lungs: normal WOB, no respiratory distress, diminished sounds at bilateral bases, L chest tube in place, +air leak Cardiovascular: RRR, no murmurs, rubs, or gallops Abdomen: soft, non tender, no guarding, slightly distended Extremities: No cyanosis, clubbing, or edema. No new rashes or lesions Neuro: A&Ox3, no focal deficits appreciated, CN II-XII grossly intact  Ancillary tests reviewed by me:  CXR 5/31 > decreased small L PTX, low lung volumes  Assessment & Plan:   Spontaneous left sided pneumothorax s/p chest tube placement Likely secondary to ruptured blebs in setting of 44 pack year smoking history and COPD. Previous CT 04/2021 with small bullae, emphysema, and scarring of apices. High risk of reoccurrence. CT surgery consulted 5/30 and anticipating VATS with wedge resection and pleurodesis on 6/3. Chest tube placed 5/29 remains +air leak with cough. CXR demonstrating stable small left pneumothorax.  - Nasal cannula with goal Spo2 > 88-92; wean as able - Maintain chest tube to 20cm suction until air leak resolves - CXR in AM - Clamp chest tube once air leak is absent and repeat CXR in 6-8 hrs. - Routine chest tube flushes/management  COPD Home trelegy + duonebs, neither consistently used. Wheezing improving per patient; none on exam.  - Continue Breo + Incruse - Continue albuterol neb/inhaler prn for wheezing  Tobacco use disorder 44 pack-year history. Originally quit in 2021 but has intermittently smoked 1-2 cigarettes/day in last couple  of months. Counseled on smoking cessation on admission - Nicotine replacement patch  Rest per primary team. PCCM will follow for chest tube management.   Best Practice (right click and "Reselect all SmartList Selections" daily)   Diet/type: Heart healthy/carb modified DVT prophylaxis: prophylactic heparin  GI prophylaxis: PPI Lines:  N/A Foley:  N/A Code Status:  full code  Labs   CBC: Recent Labs  Lab 08/22/22 1248 08/23/22 0423  WBC 10.5 8.7  NEUTROABS 7.6  --   HGB 14.8 13.8  HCT 44.8 41.3  MCV 94.7 96.7  PLT 252 248    Basic Metabolic Panel: Recent Labs  Lab 08/22/22 1248 08/23/22 0423  NA 139 138  K 4.0 4.2  CL 102 104  CO2 26 25  GLUCOSE 147* 147*  BUN 11 12  CREATININE 1.31* 1.06  CALCIUM 9.7 9.2   GFR: Estimated Creatinine Clearance: 60.3 mL/min (by C-G formula based on SCr of 1.06 mg/dL). Recent Labs  Lab 08/22/22 1231 08/22/22 1248 08/23/22 0423  WBC  --  10.5 8.7  LATICACIDVEN 1.7  --   --     Liver Function Tests: Recent Labs  Lab 08/22/22 1248  AST 43*  ALT 42  ALKPHOS 81  BILITOT 0.5  PROT 6.9  ALBUMIN 3.8   Recent Labs  Lab 08/22/22 1248  LIPASE 44   No results for input(s): "AMMONIA" in the last 168 hours.  ABG No results found for: "PHART", "PCO2ART", "PO2ART", "HCO3", "TCO2", "ACIDBASEDEF", "O2SAT"   Coagulation Profile: No results for input(s): "INR", "PROTIME" in the last 168 hours.  Cardiac Enzymes: No results for input(s): "CKTOTAL", "CKMB", "CKMBINDEX", "TROPONINI" in the last 168 hours.  HbA1C: Hgb A1c MFr Bld  Date/Time Value Ref Range Status  07/18/2022 04:42 PM 6.6 (H) <5.7 % of total Hgb Final    Comment:    For someone without known diabetes, a hemoglobin A1c value of 6.5% or greater indicates that they may have  diabetes and this should be confirmed with a follow-up  test. . For someone with known diabetes, a value <7% indicates  that their diabetes is well controlled and a value  greater than or equal to 7% indicates suboptimal  control. A1c targets should be individualized based on  duration of diabetes, age, comorbid conditions, and  other considerations. . Currently, no consensus exists regarding use of hemoglobin A1c for diagnosis of diabetes for children. Marland Kitchen   04/10/2022 12:00 AM 6.4 (H) <5.7 % of total Hgb Final     Comment:    For someone without known diabetes, a hemoglobin  A1c value between 5.7% and 6.4% is consistent with prediabetes and should be confirmed with a  follow-up test. . For someone with known diabetes, a value <7% indicates that their diabetes is well controlled. A1c targets should be individualized based on duration of diabetes, age, comorbid conditions, and other considerations. . This assay result is consistent with an increased risk of diabetes. . Currently, no consensus exists regarding use of hemoglobin A1c for diagnosis of diabetes for children. .     CBG: Recent Labs  Lab 08/22/22 1758 08/23/22 0802 08/23/22 1135 08/23/22 1542 08/24/22 0824  GLUCAP 116* 135* 147* 140* 157*    Review of Systems:   ROS negative  Past Medical History:  He,  has a past medical history of Diabetes mellitus without complication (HCC), Hyperlipidemia, Hypertension, and Vitamin D deficiency.   Surgical History:   Past Surgical History:  Procedure Laterality Date   CATARACT EXTRACTION, BILATERAL  Bilateral 2018   Dr. Wayland Denis     Social History:   reports that he quit smoking about 2 months ago. His smoking use included cigarettes. He started smoking about 37 years ago. He has a 44.00 pack-year smoking history. He has never used smokeless tobacco. He reports that he does not drink alcohol and does not use drugs.   Family History:  His family history includes Diabetes in his sister; Heart attack in his brother, father, and mother; Heart disease in his father, mother, and sister; Hypertension in his brother.   Allergies Allergies  Allergen Reactions   Ppd [Tuberculin Purified Protein Derivative]     Positive PPD.     Home Medications  Prior to Admission medications   Medication Sig Start Date End Date Taking? Authorizing Provider  aspirin 81 MG tablet Take 81 mg by mouth daily.   Yes [provider]  cetirizine (ZYRTEC ALLERGY) 10 MG tablet Take one tablet  nightly for allergies. 07/16/19  Yes McClanahan, Bella Kennedy, NP  Cholecalciferol (VITAMIN D) 2000 UNITS CAPS Take 2 capsules by mouth daily.    Yes [provider]  diltiazem (DILACOR XR) 240 MG 24 hr capsule TAKE 1 CAPSULE DAILY FOR BLOOD PRESSURE AND HEART RHYTHM 08/16/21  Yes Judd Gaudier, NP  famotidine (PEPCID) 40 MG tablet Take 1 tablet (40 mg total) by mouth 2 (two) times daily. 09/18/21  Yes Cranford, Archie Patten, NP  losartan (COZAAR) 100 MG tablet TAKE 1 TABLET EVERY DAY FOR BLOOD PRESSURE Patient taking differently: Take 100 mg by mouth daily. TAKE 1 TABLET EVERY DAY FOR BLOOD PRESSURE 03/22/21  Yes Judd Gaudier, NP  magnesium gluconate (MAGONATE) 500 MG tablet Take 500 mg by mouth daily.    Yes [provider]  metoCLOPramide (REGLAN) 10 MG tablet Take  1 tablet  3 x/ day  with Lunch, Supper & Bedtime for Acid Indigestion & Reflux. 03/01/22  Yes Cranford, Archie Patten, NP  montelukast (SINGULAIR) 10 MG tablet Take 1 tablet (10 mg total) by mouth daily. 06/01/22 06/01/23 Yes Raynelle Dick, NP  pantoprazole (PROTONIX) 40 MG tablet Take  1 tablet  2 x /day  to Prevent Indigestion & Heartburn 10/10/21  Yes [name], MD  rosuvastatin (CRESTOR) 40 MG tablet TAKE 1 TABLET DAILY FOR CHOLESTEROL 03/10/22  Yes [name], MD  zinc gluconate 50 MG tablet Take 50 mg by mouth daily.   Yes [provider]  ACCU-CHEK SOFTCLIX LANCETS lancets Check blood sugar 1 time daily-DX-R73.09 11/06/17   [name], MD  albuterol (VENTOLIN HFA) 108 (90 Base) MCG/ACT inhaler 2 inhalations 15 minutes apart every 4 hours to Rescue Asthma 07/11/21   Adela Glimpse, NP  Alcohol Swabs (B-D SINGLE USE SWABS REGULAR) PADS Check blood sugar 1 time daily-DX-R73.09. 11/06/17   [name], MD  Blood Glucose Calibration (ACCU-CHEK AVIVA) SOLN Use per box instruction. 11/06/17   [name], MD  blood glucose meter kit and supplies KIT Test blood sugar daily or as directed 09/18/21   Adela Glimpse, NP  Blood Glucose Monitoring Suppl (ACCU-CHEK AVIVA PLUS) w/Device KIT Check blood sugar 1 time daily-DX-R73.09 11/06/17   [name], MD  fluticasone [facility]) 50 MCG/ACT nasal spray 2 sprays each nostril daily 06/01/22   Raynelle Dick, NP  Fluticasone-Umeclidin-Vilant (TRELEGY ELLIPTA) 200-62.5-25 MCG/ACT AEPB Use  1 Inhalation  Daily  for COPD /Asthma 07/11/21   Adela Glimpse, NP  glucose blood (ACCU-CHEK AVIVA PLUS) test strip Check blood sugar 1 time daily-R73.09. 11/06/17   [name],  MD  ipratropium-albuterol (DUONEB) 0.5-2.5 (3) MG/3ML SOLN USE 1 VIAL IN NEBULIZER EVERY 4 TO 6 HOURS - As Needed 05/12/22   Raynelle Dick, NP    Adolph Pollack, MS4

## 2022-08-24 NOTE — Progress Notes (Signed)
PROGRESS NOTE    Jesus Bush  UEA:[id number] DOB: 07-Jun-1945 DOA: 08/22/2022 PCP: [name], MD    Chief Complaint  Patient presents with   Abdominal Pain    Brief Narrative:   Jesus Bush  is a 77 y.o. male, with past medical history of active tobacco abuse, COPD, hypertension, hyperlipidemia, aortic atherosclerosis, diabetes mellitus, patient presents to ED secondary to complaints of bloating, epigastric pain, reports pain has started earlier this morning around 4 AM, has been intermittent, resolved with breakfast, but happened again late a.m., was radiating to his abdomen and lower chest, and he does report some dyspnea, denies fever, chills.  He denies any fall, no trauma. -NAD patient had CT chest/abdomen, and CTA chest, which did show large left-sided pneumothorax, so chest tube has been inserted by ED physician, with follow-up chest x-ray showing mental and near reexpansion of the left lung, he was admitted for further management.  Assessment & Plan:   Principal Problem:   Pneumothorax Active Problems:   Abnormal glucose (prediabetes)   Essential hypertension   Mixed hyperlipidemia   Esophageal reflux   COPD (chronic obstructive pulmonary disease) (HCC)   Aortic atherosclerosis (HCC)   Hyperthyroidism   Tobacco dependence   Spontaneous left-sided pneumothorax -Most likely in the setting of history of smoking, and COPD. -Chest tube management per PCCM. -Continue with as needed pain medications, pain is controlled -CT surgery consulted to consider VATS pleurodesis, plan for surgery on Monday  COPD -Continue with Breo and Incruse -No active wheezing, continue with as needed nebs   Tobacco abuse -He was counseled, will keep on nicotine patch   History of CAD - Resume statin, will resume aspirin when more stable   GERD -Daughter-in-law reports significant reflux, he is scheduled to follow with GI as an outpatient, plan for next month, I will increase  his Protonix to 40 mg IV twice daily and keep on Carafate to help with symptoms meanwhile.   Hyperlipidemia -Continue with statin   Hypertension -Sugar has been low couple times yesterday, so I will decrease his home medication including losartan and diltiazem   Diabetes mellitus - Controlled with diet, will check A1c, and will monitor CBGs.     DVT prophylaxis: Heparin Code Status: Full Family Communication: Discussed with daughter and low at bedside Disposition:   Status is: Inpatient    Consultants:  PCCM CT surgery   Subjective:  No significant events overnight, he denies any complaints today, reports chest pain at chest tube insertion site controlled, he reports constipation   Objective: Vitals:   08/24/22 0400 08/24/22 0800 08/24/22 0819 08/24/22 0916  BP: 123/83 (!) 139/95    Pulse: 81 100    Resp: 14 (!) 21  20  Temp: 98.4 F (36.9 C) 98.1 F (36.7 C)    TempSrc: Oral Oral    SpO2: 95% 92% 92%   Weight:    73.5 kg  Height:    5\' 10"  (1.778 m)    Intake/Output Summary (Last 24 hours) at 08/24/2022 1016 Last data filed at 08/24/2022 0917 Gross per 24 hour  Intake 243 ml  Output 1025 ml  Net -782 ml   Filed Weights   08/24/22 0916  Weight: 73.5 kg    Examination:  Awake, alert, oriented,  in no apparent distress Symmetrical Chest wall movement, left chest tube present, Rales at the bases RRR,No Gallops,Rubs or new Murmurs, No Parasternal Heave +ve B.Sounds, Abd Soft, No tenderness, No rebound - guarding or rigidity. No Cyanosis,  Clubbing or edema, No new Rash or bruise        Data Reviewed: I have personally reviewed following labs and imaging studies  CBC: Recent Labs  Lab 08/22/22 1248 08/23/22 0423  WBC 10.5 8.7  NEUTROABS 7.6  --   HGB 14.8 13.8  HCT 44.8 41.3  MCV 94.7 96.7  PLT 252 248    Basic Metabolic Panel: Recent Labs  Lab 08/22/22 1248 08/23/22 0423  NA 139 138  K 4.0 4.2  CL 102 104  CO2 26 25  GLUCOSE 147*  147*  BUN 11 12  CREATININE 1.31* 1.06  CALCIUM 9.7 9.2    GFR: Estimated Creatinine Clearance: 60.3 mL/min (by C-G formula based on SCr of 1.06 mg/dL).  Liver Function Tests: Recent Labs  Lab 08/22/22 1248  AST 43*  ALT 42  ALKPHOS 81  BILITOT 0.5  PROT 6.9  ALBUMIN 3.8    CBG: Recent Labs  Lab 08/22/22 1758 08/23/22 0802 08/23/22 1135 08/23/22 1542 08/24/22 0824  GLUCAP 116* 135* 147* 140* 157*     No results found for this or any previous visit (from the past 240 hour(s)).       Radiology Studies: DG Chest Port 1 View  Result Date: 08/23/2022 CLINICAL DATA:  Pneumothorax EXAM: PORTABLE CHEST 1 VIEW COMPARISON:  Chest radiograph dated 08/22/2022 FINDINGS: Lines/tubes: Similar location of left pleural catheter. Lungs: Slightly increased left basilar patchy and linear opacities. Pleura: Similar small to moderate left pneumothorax. Heart/mediastinum: The heart size and mediastinal contours are within normal limits. Bones: No acute osseous abnormality. IMPRESSION: 1. Similar small to moderate left pneumothorax with left pleural catheter in place. 2. Slightly increased left basilar patchy and linear opacities, likely atelectasis. Electronically Signed   By: Agustin Cree M.D.   On: 08/23/2022 15:22   DG Chest Portable 1 View  Result Date: 08/22/2022 CLINICAL DATA:  Pneumothorax status post chest tube placement EXAM: PORTABLE CHEST 1 VIEW COMPARISON:  CTA chest dated 08/22/2022, chest radiograph dated 05/15/2021 FINDINGS: Lines/tubes: Left inferolateral approach pleural catheter tip projects over the left hilum. Lungs: Interval re-expansion of the left lung with patchy left basilar opacities. Pleura: Moderate left pneumothorax. Heart/mediastinum: The heart size and mediastinal contours are within normal limits. Bones: No acute osseous abnormality. Mild left chest wall subcutaneous emphysema. IMPRESSION: 1. Decreased moderate left pneumothorax with left inferolateral approach  pleural catheter in place. 2. Interval re-expansion of the left lung with patchy left basilar opacities, likely atelectasis. Electronically Signed   By: Agustin Cree M.D.   On: 08/22/2022 15:55   CT ABDOMEN PELVIS W CONTRAST  Result Date: 08/22/2022 CLINICAL DATA:  Epigastric pressure associated with abdominal distention EXAM: CT ANGIOGRAPHY CHEST CT ABDOMEN AND PELVIS WITH CONTRAST TECHNIQUE: Multidetector CT imaging of the chest was performed using the standard protocol during bolus administration of intravenous contrast. Multiplanar CT image reconstructions and MIPs were obtained to evaluate the vascular anatomy. Multidetector CT imaging of the abdomen and pelvis was performed using the standard protocol during bolus administration of intravenous contrast. RADIATION DOSE REDUCTION: This exam was performed according to the departmental dose-optimization program which includes automated exposure control, adjustment of the mA and/or kV according to patient size and/or use of iterative reconstruction technique. CONTRAST:  75mL OMNIPAQUE IOHEXOL 350 MG/ML SOLN COMPARISON:  CT chest dated 05/17/2021, 02/26/2020 FINDINGS: CTA CHEST FINDINGS Cardiovascular: The study is high quality for the evaluation of pulmonary embolism. There are no filling defects in the central, lobar, segmental or subsegmental pulmonary artery branches  to suggest acute pulmonary embolism. Great vessels are normal in course and caliber. Mild left atrial enlargement. No significant pericardial fluid/thickening. Coronary artery calcifications. Mediastinum/Nodes: Rightward shift of the mediastinum. Imaged thyroid gland without nodules meeting criteria for imaging follow-up by size. Small hiatal hernia. No pathologically enlarged axillary, supraclavicular, mediastinal, or hilar lymph nodes. Lungs/Pleura: The central airways are patent. Near-complete atelectasis of the left lung. Posterior right apical pleural-parenchymal scarring. Left large left  pneumothorax. No pleural effusion. Musculoskeletal: No acute or abnormal lytic or blastic osseous lesions. Multilevel degenerative changes of the thoracic spine. Unchanged subcutaneous density along the paramidline left upper back measures 1.5 x 1.2 cm (5:11), likely an inclusion cyst. Review of the MIP images confirms the above findings. CT ABDOMEN and PELVIS FINDINGS Hepatobiliary: Scattered subcentimeter hypodensities, too small to characterize but unchanged from 02/26/2020. No intra or extrahepatic biliary ductal dilation. Normal gallbladder. Pancreas: No focal lesions or main ductal dilation. Spleen: Normal in size without focal abnormality. Adrenals/Urinary Tract: No adrenal nodules. No suspicious renal mass, calculi, or hydronephrosis. Bilateral hypodensities measuring up to 1.3 cm, likely simple/minimally complicated cysts. No specific follow-up imaging recommended. No focal bladder wall thickening. Stomach/Bowel: Normal appearance of the stomach. No evidence of bowel wall thickening, distention, or inflammatory changes. Colonic diverticulosis without acute diverticulitis. Normal appendix. Vascular/Lymphatic: Aortic atherosclerosis. No enlarged abdominal or pelvic lymph nodes. Reproductive: Enlargement of the prostate with median lobe hypertrophy. Other: No free fluid, fluid collection, or free air. Musculoskeletal: No acute or abnormal lytic or blastic osseous lesions. 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 Multilevel degenerative changes of the lumbar spine. Age indeterminate superior endplate compression of L1. IMPRESSION: 1. No evidence of pulmonary embolism. 2. Large left pneumothorax with rightward shift of the mediastinum and near-complete atelectasis of the left lung. 3. No acute findings in the abdomen or pelvis. 4. Colonic diverticulosis without acute diverticulitis. 5. Enlargement of the prostate with median lobe hypertrophy. 6.  Aortic Atherosclerosis (ICD10-I70.0).  Critical Value/emergent results were called by telephone at the time of interpretation on 08/22/2022 at 3:05 pm to provider Gloris Manchester , who verbally acknowledged these results. Electronically Signed   By: Agustin Cree M.D.   On: 08/22/2022 15:19   CT Angio Chest PE W and/or Wo Contrast  Result Date: 08/22/2022 CLINICAL DATA:  Epigastric pressure associated with abdominal distention EXAM: CT ANGIOGRAPHY CHEST CT ABDOMEN AND PELVIS WITH CONTRAST TECHNIQUE: Multidetector CT imaging of the chest was performed using the standard protocol during bolus administration of intravenous contrast. Multiplanar CT image reconstructions and MIPs were obtained to evaluate the vascular anatomy. Multidetector CT imaging of the abdomen and pelvis was performed using the standard protocol during bolus administration of intravenous contrast. RADIATION DOSE REDUCTION: This exam was performed according to the departmental dose-optimization program which includes automated exposure control, adjustment of the mA and/or kV according to patient size and/or use of iterative reconstruction technique. CONTRAST:  75mL OMNIPAQUE IOHEXOL 350 MG/ML SOLN COMPARISON:  CT chest dated 05/17/2021, 02/26/2020 FINDINGS: CTA CHEST FINDINGS Cardiovascular: The study is high quality for the evaluation of pulmonary embolism. There are no filling defects in the central, lobar, segmental or subsegmental pulmonary artery branches to suggest acute pulmonary embolism. Great vessels are normal in course and caliber. Mild left atrial enlargement. No significant pericardial fluid/thickening. Coronary artery calcifications. Mediastinum/Nodes: Rightward shift of the mediastinum. Imaged thyroid gland without nodules meeting criteria for imaging follow-up by size. Small hiatal hernia. No pathologically enlarged axillary, supraclavicular, mediastinal, or hilar lymph nodes. Lungs/Pleura: The central  airways are patent. Near-complete atelectasis of the left lung. Posterior  right apical pleural-parenchymal scarring. Left large left pneumothorax. No pleural effusion. Musculoskeletal: No acute or abnormal lytic or blastic osseous lesions. Multilevel degenerative changes of the thoracic spine. Unchanged subcutaneous density along the paramidline left upper back measures 1.5 x 1.2 cm (5:11), likely an inclusion cyst. Review of the MIP images confirms the above findings. CT ABDOMEN and PELVIS FINDINGS Hepatobiliary: Scattered subcentimeter hypodensities, too small to characterize but unchanged from 02/26/2020. No intra or extrahepatic biliary ductal dilation. Normal gallbladder. Pancreas: No focal lesions or main ductal dilation. Spleen: Normal in size without focal abnormality. Adrenals/Urinary Tract: No adrenal nodules. No suspicious renal mass, calculi, or hydronephrosis. Bilateral hypodensities measuring up to 1.3 cm, likely simple/minimally complicated cysts. No specific follow-up imaging recommended. No focal bladder wall thickening. Stomach/Bowel: Normal appearance of the stomach. No evidence of bowel wall thickening, distention, or inflammatory changes. Colonic diverticulosis without acute diverticulitis. Normal appendix. Vascular/Lymphatic: Aortic atherosclerosis. No enlarged abdominal or pelvic lymph nodes. Reproductive: Enlargement of the prostate with median lobe hypertrophy. Other: No free fluid, fluid collection, or free air. Musculoskeletal: No acute or abnormal lytic or blastic osseous lesions. 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 Multilevel degenerative changes of the lumbar spine. Age indeterminate superior endplate compression of L1. IMPRESSION: 1. No evidence of pulmonary embolism. 2. Large left pneumothorax with rightward shift of the mediastinum and near-complete atelectasis of the left lung. 3. No acute findings in the abdomen or pelvis. 4. Colonic diverticulosis without acute diverticulitis. 5. Enlargement of the prostate with median lobe  hypertrophy. 6.  Aortic Atherosclerosis (ICD10-I70.0). Critical Value/emergent results were called by telephone at the time of interpretation on 08/22/2022 at 3:05 pm to provider Gloris Manchester , who verbally acknowledged these results. Electronically Signed   By: Agustin Cree M.D.   On: 08/22/2022 15:19        Scheduled Meds:  diltiazem  180 mg Oral Daily   docusate sodium  100 mg Oral BID   fluticasone furoate-vilanterol  1 puff Inhalation Daily   And   umeclidinium bromide  1 puff Inhalation Daily   heparin  5,000 Units Subcutaneous Q8H   losartan  50 mg Oral Daily   montelukast  10 mg Oral Daily   nicotine  21 mg Transdermal Daily   pantoprazole (PROTONIX) IV  40 mg Intravenous Q12H   polyethylene glycol  17 g Oral BID   rosuvastatin  40 mg Oral Daily   sodium chloride flush  10 mL Intrapleural Q8H   sucralfate  1 g Oral TID WC & HS   Continuous Infusions:   LOS: 2 days      Huey Bienenstock, MD [facility]   To contact the attending provider between 7A-7P or the covering provider during after hours 7P-7A, please log into the web site [email or web address] and access using universal [place] password for that web site. If you do not have the password, please call the hospital operator.  08/24/2022, 10:16 AM

## 2022-08-24 NOTE — Care Management Important Message (Signed)
Important Message  Patient Details  Name: Jesus Bush MRN: [id number] Date of Birth: 1946-03-17   Medicare Important Message Given:  Yes     Leather Estis Stefan Church 08/24/2022, 3:38 PM

## 2022-08-24 NOTE — Progress Notes (Addendum)
      [address]       [place] [address]             [phone]         Subjective: Pt sitting in chair states he feels good today and slept well last night. His pain has improved and he was able to walk to the bathroom this AM.   Objective: Vital signs in last 24 hours: Temp:  [98.1 F (36.7 C)-98.6 F (37 C)] 98.1 F (36.7 C) (05/31 0800) Pulse Rate:  [61-100] 100 (05/31 0800) Cardiac Rhythm: Normal sinus rhythm (05/31 0700) Resp:  [14-26] 20 (05/31 0916) BP: (115-142)/(61-95) 139/95 (05/31 0800) SpO2:  [89 %-95 %] 92 % (05/31 0819) Weight:  [73.5 kg] 73.5 kg (05/31 0916)  Hemodynamic parameters for last 24 hours:    Intake/Output from previous day: 05/30 0701 - 05/31 0700 In: -  Out: 1025 [Urine:1025] Intake/Output this shift: Total I/O In: 243 [I.V.:3] Out: -   General appearance: alert, cooperative, and no distress Neurologic: intact Heart: regular rate and rhythm, S1, S2 normal, no murmur, click, rub or gallop Lungs: slight wheezing bibasilar Abdomen: soft, non-tender; bowel sounds normal; no masses,  no organomegaly Extremities: extremities normal, atraumatic, no cyanosis or edema Wound: Clean and dry dressing in place around chest tube  Lab Results: Recent Labs    08/22/22 1248 08/23/22 0423  WBC 10.5 8.7  HGB 14.8 13.8  HCT 44.8 41.3  PLT 252 248   BMET:  Recent Labs    08/22/22 1248 08/23/22 0423  NA 139 138  K 4.0 4.2  CL 102 104  CO2 26 25  GLUCOSE 147* 147*  BUN 11 12  CREATININE 1.31* 1.06  CALCIUM 9.7 9.2    PT/INR: No results for input(s): "LABPROT", "INR" in the last 72 hours. ABG No results found for: "PHART", "HCO3", "TCO2", "ACIDBASEDEF", "O2SAT" CBG (last 3)  Recent Labs    08/23/22 1135 08/23/22 1542 08/24/22 0824  GLUCAP 147* 140* 157*    Assessment/Plan: S/P chest tube placement  Neuro: Pain controlled  CV: NSR, VSS  Pulm: Saturating well on 1L [place] this AM, pt's daughter is a Engineer, civil (consulting) and  states he desaturated into high 80s when oxygen was removed. CT to suction, +air leak with cough. CXR ordered, not done yet. Encouraged ambulation.   GI: Tolerating diet  Dispo: Continue CT to suction, to OR for VATS with pleurodesis Monday   LOS: 2 days    Jenny Reichmann, PA-C 08/24/2022    Agree with above OR on Monday

## 2022-08-25 ENCOUNTER — Inpatient Hospital Stay (HOSPITAL_COMMUNITY): Payer: Medicare HMO

## 2022-08-25 ENCOUNTER — Encounter (HOSPITAL_COMMUNITY): Payer: Self-pay | Admitting: Internal Medicine

## 2022-08-25 DIAGNOSIS — I4892 Unspecified atrial flutter: Secondary | ICD-10-CM | POA: Diagnosis not present

## 2022-08-25 DIAGNOSIS — I483 Typical atrial flutter: Secondary | ICD-10-CM

## 2022-08-25 DIAGNOSIS — J9311 Primary spontaneous pneumothorax: Secondary | ICD-10-CM | POA: Diagnosis not present

## 2022-08-25 LAB — BASIC METABOLIC PANEL
Anion gap: 12 (ref 5–15)
BUN: 12 mg/dL (ref 8–23)
CO2: 25 mmol/L (ref 22–32)
Calcium: 9.9 mg/dL (ref 8.9–10.3)
Chloride: 98 mmol/L (ref 98–111)
Creatinine, Ser: 1.01 mg/dL (ref 0.61–1.24)
GFR, Estimated: >60 mL/min (ref >60)
Glucose, Bld: 142 mg/dL — ABNORMAL HIGH (ref 70–99)
Potassium: 4.1 mmol/L (ref 3.5–5.1)
Sodium: 135 mmol/L (ref 135–145)

## 2022-08-25 LAB — CBC
HCT: 43.3 % (ref 39.0–52.0)
Hemoglobin: 14.5 g/dL (ref 13.0–17.0)
MCH: 31.9 pg (ref 26.0–34.0)
MCHC: 33.5 g/dL (ref 30.0–36.0)
MCV: 95.4 fL (ref 80.0–100.0)
Platelets: 272 10*3/uL (ref 150–400)
RBC: 4.54 MIL/uL (ref 4.22–5.81)
RDW: 12.8 % (ref 11.5–15.5)
WBC: 16.5 10*3/uL — ABNORMAL HIGH (ref 4.0–10.5)
nRBC: 0 % (ref 0.0–0.2)

## 2022-08-25 LAB — GLUCOSE, CAPILLARY
Glucose-Capillary: 119 mg/dL — ABNORMAL HIGH (ref 70–99)
Glucose-Capillary: 122 mg/dL — ABNORMAL HIGH (ref 70–99)
Glucose-Capillary: 144 mg/dL — ABNORMAL HIGH (ref 70–99)
Glucose-Capillary: 132 mg/dL — ABNORMAL HIGH (ref 70–99)

## 2022-08-25 LAB — MAGNESIUM: Magnesium: 2.3 mg/dL (ref 1.7–2.4)

## 2022-08-25 LAB — TSH: TSH: 1.481 u[IU]/mL (ref 0.350–4.500)

## 2022-08-25 LAB — HEPARIN LEVEL (UNFRACTIONATED): Heparin Unfractionated: 0.4 IU/mL (ref 0.30–0.70)

## 2022-08-25 LAB — T4, FREE: Free T4: 1.06 ng/dL (ref 0.61–1.12)

## 2022-08-25 MED ORDER — DILTIAZEM HCL 25 MG/5ML IV SOLN
10.0000 mg | Freq: Once | INTRAVENOUS | Status: AC
  Administered 2022-08-25: 10 mg via INTRAVENOUS
  Filled 2022-08-25: qty 5

## 2022-08-25 MED ORDER — MAGNESIUM CITRATE PO SOLN
0.5000 | Freq: Once | ORAL | Status: AC
  Administered 2022-08-25: 0.5 via ORAL
  Filled 2022-08-25: qty 296

## 2022-08-25 MED ORDER — DILTIAZEM HCL-DEXTROSE 125-5 MG/125ML-% IV SOLN (PREMIX)
5.0000 mg/h | INTRAVENOUS | Status: DC
  Administered 2022-08-25: 7.5 mg/h via INTRAVENOUS
  Administered 2022-08-25: 5 mg/h via INTRAVENOUS
  Administered 2022-08-26 – 2022-08-28 (×5): 10 mg/h via INTRAVENOUS
  Filled 2022-08-25 (×6): qty 125

## 2022-08-25 MED ORDER — HEPARIN (PORCINE) 25000 UT/250ML-% IV SOLN
1000.0000 [IU]/h | INTRAVENOUS | Status: AC
  Administered 2022-08-25: 1050 [IU]/h via INTRAVENOUS
  Administered 2022-08-26: 1000 [IU]/h via INTRAVENOUS
  Filled 2022-08-25 (×2): qty 250

## 2022-08-25 MED ORDER — DILTIAZEM LOAD VIA INFUSION
10.0000 mg | Freq: Once | INTRAVENOUS | Status: DC
  Filled 2022-08-25: qty 10

## 2022-08-25 NOTE — Plan of Care (Signed)

## 2022-08-25 NOTE — Progress Notes (Signed)
Jesus Bush is a 78 y.o. male patient. 1. Spontaneous pneumothorax   Patient experienced tachycardia intermittently in 150s, EKG complete, MD made aware, 10 mg Cardizem IV given, once heart rate slowed down, it confirmed a flutter. Repeat EKG complete. 2D ECHO ordered by MD, Cardiology consult placed and Cardiologist met patient and family member at bedside. TSH and free T4 labs collected and resulted. Continuous heparin drip started at 10.56mL/hr and diltiazem titrated to 7.5 mg/hr. Current heart rate is 90.   Past Medical History:  Diagnosis Date   Diabetes mellitus without complication (HCC)    Hyperlipidemia    Hypertension    Vitamin D deficiency    Current Facility-Administered Medications  Medication Dose Route Frequency Provider Last Rate Last Admin   acetaminophen (TYLENOL) tablet 650 mg  650 mg Oral Q6H PRN Elgergawy, Leana Roe, MD       Or   acetaminophen (TYLENOL) suppository 650 mg  650 mg Rectal Q6H PRN Elgergawy, Leana Roe, MD       albuterol (PROVENTIL) (2.5 MG/3ML) 0.083% nebulizer solution 2.5 mg  2.5 mg Nebulization Q2H PRN Elgergawy, Leana Roe, MD   2.5 mg at 08/24/22 1357   albuterol (VENTOLIN HFA) 108 (90 Base) MCG/ACT inhaler 2 puff  2 puff Inhalation Q6H PRN Desai, Rahul P, PA-C       alum & mag hydroxide-simeth (MAALOX/MYLANTA) 200-200-20 MG/5ML suspension 30 mL  30 mL Oral Q4H PRN Elgergawy, Leana Roe, MD   30 mL at 08/23/22 0841   diltiazem (CARDIZEM) 125 mg in dextrose 5% 125 mL (1 mg/mL) infusion  5-15 mg/hr Intravenous Titrated Rollene Rotunda, MD 7.5 mL/hr at 08/25/22 1732 7.5 mg/hr at 08/25/22 1732   docusate sodium (COLACE) capsule 100 mg  100 mg Oral BID Elgergawy, Leana Roe, MD   100 mg at 08/25/22 0912   fluticasone furoate-vilanterol (BREO ELLIPTA) 200-25 MCG/ACT 1 puff  1 puff Inhalation Daily Desai, Rahul P, PA-C   1 puff at 08/25/22 2130   And   umeclidinium bromide (INCRUSE ELLIPTA) 62.5 MCG/ACT 1 puff  1 puff Inhalation Daily Desai, Rahul P, PA-C   1  puff at 08/25/22 0828   heparin ADULT infusion 100 units/mL (25000 units/296mL)  1,050 Units/hr Intravenous Continuous Mosetta Anis, RPH 10.5 mL/hr at 08/25/22 1729 1,050 Units/hr at 08/25/22 1729   hydrALAZINE (APRESOLINE) injection 5 mg  5 mg Intravenous Q4H PRN Elgergawy, Leana Roe, MD       ipratropium-albuterol (DUONEB) 0.5-2.5 (3) MG/3ML nebulizer solution 3 mL  3 mL Nebulization Q6H PRN Desai, Rahul P, PA-C       montelukast (SINGULAIR) tablet 10 mg  10 mg Oral Daily Elgergawy, Leana Roe, MD   10 mg at 08/25/22 0912   morphine (PF) 2 MG/ML injection 2 mg  2 mg Intravenous Q4H PRN Elgergawy, Leana Roe, MD   2 mg at 08/25/22 1546   nicotine (NICODERM CQ - dosed in mg/24 hours) patch 21 mg  21 mg Transdermal Daily Elgergawy, Leana Roe, MD   21 mg at 08/25/22 0914   ondansetron (ZOFRAN) tablet 4 mg  4 mg Oral Q6H PRN Elgergawy, Leana Roe, MD       Or   ondansetron (ZOFRAN) injection 4 mg  4 mg Intravenous Q6H PRN Elgergawy, Leana Roe, MD   4 mg at 08/22/22 1938   oxyCODONE (Oxy IR/ROXICODONE) immediate release tablet 5 mg  5 mg Oral Q4H PRN Elgergawy, Leana Roe, MD   5 mg at 08/25/22 1725   [START ON  08/26/2022] pantoprazole (PROTONIX) EC tablet 40 mg  40 mg Oral BID Pham, Minh Q, RPH-CPP       pantoprazole (PROTONIX) injection 40 mg  40 mg Intravenous Q12H Pham, Minh Q, RPH-CPP   40 mg at 08/25/22 0912   polyethylene glycol (MIRALAX / GLYCOLAX) packet 17 g  17 g Oral BID Elgergawy, Leana Roe, MD   17 g at 08/25/22 0912   rosuvastatin (CRESTOR) tablet 40 mg  40 mg Oral Daily Elgergawy, Leana Roe, MD   40 mg at 08/25/22 0913   sodium chloride flush (NS) 0.9 % injection 10 mL  10 mL Intrapleural Q8H Elgergawy, Leana Roe, MD   10 mL at 08/25/22 1540   sucralfate (CARAFATE) 1 GM/10ML suspension 1 g  1 g Oral TID WC & HS Elgergawy, Leana Roe, MD   1 g at 08/25/22 1540   Allergies  Allergen Reactions   Ppd [Tuberculin Purified Protein Derivative]     Positive PPD.   Principal Problem:    Pneumothorax Active Problems:   Abnormal glucose (prediabetes)   Essential hypertension   Mixed hyperlipidemia   Esophageal reflux   COPD (chronic obstructive pulmonary disease) (HCC)   Aortic atherosclerosis (HCC)   Hyperthyroidism   Tobacco dependence  Blood pressure 104/77, pulse 90, temperature 99.3 F (37.4 C), temperature source Oral, resp. rate 17, height 5\' 10"  (1.778 m), weight 73.5 kg, SpO2 92 %.  Subjective Objective: Vital signs: (most recent): Blood pressure 104/77, pulse 90, temperature 99.3 F (37.4 C), temperature source Oral, resp. rate 17, height 5\' 10"  (1.778 m), weight 73.5 kg, SpO2 92 %.    Assessment & Plan  Letishia Elliott A Shakima Nisley 08/25/2022

## 2022-08-25 NOTE — Progress Notes (Signed)
      [address]       [place] [address]             [phone]       Procedure(s) (LRB): XI ROBOTIC ASSISTED THORACOSCOPY-WEDGE RESECTION (Left)  Subjective:  Patient getting ready to work with PT.Marland Kitchen Chest tube has been getting kinked and pulling on patient  Objective: Vital signs in last 24 hours: Temp:  [98.2 F (36.8 C)-98.9 F (37.2 C)] 98.2 F (36.8 C) (06/01 0704) Pulse Rate:  [67-72] 72 (06/01 0704) Cardiac Rhythm: Normal sinus rhythm (05/31 1906) Resp:  [14-20] 19 (06/01 0704) BP: (113-128)/(57-84) 128/71 (06/01 0704) SpO2:  [91 %-95 %] 94 % (06/01 0828) Weight:  [73.5 kg] 73.5 kg (05/31 0916)  Intake/Output from previous day: 05/31 0701 - 06/01 0700 In: 243 [I.V.:3] Out: -  Intake/Output this shift: Total I/O In: 240  Out: -   General appearance: alert, cooperative, and no distress Heart: regular rate and rhythm Lungs: clear to auscultation bilaterally Wound: clean and dry  Lab Results: Recent Labs    08/22/22 1248 08/23/22 0423  WBC 10.5 8.7  HGB 14.8 13.8  HCT 44.8 41.3  PLT 252 248   BMET:  Recent Labs    08/22/22 1248 08/23/22 0423  NA 139 138  K 4.0 4.2  CL 102 104  CO2 26 25  GLUCOSE 147* 147*  BUN 11 12  CREATININE 1.31* 1.06  CALCIUM 9.7 9.2    PT/INR: No results for input(s): "LABPROT", "INR" in the last 72 hours. ABG No results found for: "PHART", "HCO3", "TCO2", "ACIDBASEDEF", "O2SAT" CBG (last 3)  Recent Labs    08/24/22 1632 08/24/22 2030 08/25/22 0752  GLUCAP 145* 144* 119*    Assessment/Plan: S/P Procedure(s) (LRB): XI ROBOTIC ASSISTED THORACOSCOPY-WEDGE RESECTION (Left)  Spontaneous Pneumothorax- due to Bullous Emphysema.. CT in place on water seal.. no air leak present.. CXR w/o significant pneumothorax...  OR for VATS Monday... care per medicine   LOS: 3 days   Lowella Dandy, PA-C 08/25/2022

## 2022-08-25 NOTE — Evaluation (Signed)
Physical Therapy Evaluation Patient Details Name: Jesus Bush MRN: [id number] DOB: 03/01/1946 Today's Date: 08/25/2022  History of Present Illness  Patient is 77 y.o. male who presented to ED secondary to complaints of bloating, epigastric pain, reports pain has started earlier this morning around 4 AM, has been intermittent. CT chest/abdomen, and CTA chest, which did show large left-sided pneumothorax, so chest tube has been inserted by ED physician, with follow-up chest x-ray showing mental and near reexpansion of the left lung. PMH significant for active tobacco abuse, COPD, hypertension, hyperlipidemia, aortic atherosclerosis, diabetes mellitus.   Clinical Impression  Jesus Bush is 77 y.o. male admitted with above HPI and diagnosis. Patient is currently limited by functional impairments below (see PT problem list). Patient lives with spouse and has excellent family support and is independent with no AD for mobility at baseline. Currently he is mobilizing at Mod I to supervision level with no AD for gait; cues and assist required for line management. Pt amb on 2L/min and SpO2 92-94% throughout. Patient will benefit from continued skilled PT interventions to address impairments and progress independence with mobility. Acute PT will follow and progress as able.        Recommendations for follow up therapy are one component of a multi-disciplinary discharge planning process, led by the attending physician.  Recommendations may be updated based on patient status, additional functional criteria and insurance authorization.  Follow Up Recommendations       Assistance Recommended at Discharge Intermittent Supervision/Assistance  Patient can return home with the following  A little help with walking and/or transfers;A little help with bathing/dressing/bathroom;Assistance with cooking/housework    Equipment Recommendations    Recommendations for Other Services       Functional Status  Assessment       Precautions / Restrictions Precautions Precautions: Fall Restrictions Weight Bearing Restrictions: No      Mobility  Bed Mobility Overal bed mobility: Modified Independent             General bed mobility comments: use of bed features    Transfers Overall transfer level: Needs assistance Equipment used: None Transfers: Sit to/from Stand Sit to Stand: Supervision           General transfer comment: sup for safety with cues for line management    Ambulation/Gait Ambulation/Gait assistance: Supervision Gait Distance (Feet): 300 Feet Assistive device: None Gait Pattern/deviations: Step-through pattern, Decreased stride length, Trunk flexed Gait velocity: decr     General Gait Details: pt with slightly flexed trunk secondary to side discomfort from CT, no overt LOB. pt on 2L/min throughout and SpO2 92-94% throughout mobility. HR indidcating Afib rhythm ranging from 100's-140's. No c/o SOB, CP, dizziness.  Stairs            Wheelchair Mobility    Modified Rankin (Stroke Patients Only)       Balance Overall balance assessment: Mild deficits observed, not formally tested                                           Pertinent Vitals/Pain      Home Living Family/patient expects to be discharged to:: Private residence Living Arrangements: Spouse/significant other Available Help at Discharge: Family Type of Home: House Home Access: Stairs to enter         Home Equipment: None Additional Comments: pt has a beach vacation planned for next week  Prior Function Prior Level of Function : Independent/Modified Independent;Driving                     Hand Dominance   Dominant Hand: Left    Extremity/Trunk Assessment   Upper Extremity Assessment Upper Extremity Assessment: Overall WFL for tasks assessed    Lower Extremity Assessment Lower Extremity Assessment: Overall WFL for tasks assessed     Cervical / Trunk Assessment Cervical / Trunk Assessment: Normal  Communication   Communication: No difficulties  Cognition Arousal/Alertness: Awake/alert Behavior During Therapy: WFL for tasks assessed/performed Overall Cognitive Status: Within Functional Limits for tasks assessed                                          General Comments      Exercises     Assessment/Plan    PT Assessment Patient needs continued PT services  PT Problem List Decreased activity tolerance;Decreased balance;Decreased mobility;Decreased knowledge of use of DME;Decreased safety awareness;Decreased knowledge of precautions;Cardiopulmonary status limiting activity       PT Treatment Interventions DME instruction;Gait training;Stair training;Functional mobility training;Therapeutic activities;Therapeutic exercise;Balance training;Neuromuscular re-education;Cognitive remediation;Patient/family education    PT Goals (Current goals can be found in the Care Plan section)  Acute Rehab PT Goals Patient Stated Goal: get home and to the beach PT Goal Formulation: With patient/family Time For Goal Achievement: 09/08/22 Potential to Achieve Goals: Good    Frequency Min 3X/week     Co-evaluation               AM-PAC PT "6 Clicks" Mobility  Outcome Measure Help needed turning from your back to your side while in a flat bed without using bedrails?: None Help needed moving from lying on your back to sitting on the side of a flat bed without using bedrails?: A Little Help needed moving to and from a bed to a chair (including a wheelchair)?: A Little Help needed standing up from a chair using your arms (e.g., wheelchair or bedside chair)?: A Little Help needed to walk in hospital room?: A Little Help needed climbing 3-5 steps with a railing? : A Little 6 Click Score: 19    End of Session Equipment Utilized During Treatment: Oxygen Activity Tolerance: Patient tolerated treatment  well Patient left: in bed;with call bell/phone within reach;with family/visitor present Nurse Communication: Mobility status PT Visit Diagnosis: Muscle weakness (generalized) (M62.81);Other abnormalities of gait and mobility (R26.89);Unsteadiness on feet (R26.81);Difficulty in walking, not elsewhere classified (R26.2)    Time: 1610-9604 PT Time Calculation (min) (ACUTE ONLY): 22 min   Charges:   PT Evaluation $PT Eval Low Complexity: 1 Low          Wynn Maudlin, DPT Acute Rehabilitation Services Office [phone]  08/25/22 10:57 AM

## 2022-08-25 NOTE — Progress Notes (Signed)
ANTICOAGULATION CONSULT NOTE  Pharmacy Consult for heparin Indication: atrial fibrillation  Allergies  Allergen Reactions   Ppd [Tuberculin Purified Protein Derivative]     Positive PPD.    Patient Measurements: Height: 5\' 10"  (177.8 cm) Weight: 73.5 kg (162 lb) IBW/kg (Calculated) : 73 Heparin Dosing Weight: 74kg  Vital Signs: Temp: 98 F (36.7 C) (06/01 1542) Temp Source: Oral (06/01 1542) BP: 122/86 (06/01 1542) Pulse Rate: 88 (06/01 1542)  Labs: Recent Labs    08/23/22 0423 08/25/22 1530  HGB 13.8 14.5  HCT 41.3 43.3  PLT 248 272  CREATININE 1.06  --     Estimated Creatinine Clearance: 60.3 mL/min (by C-G formula based on SCr of 1.06 mg/dL).   Medical History: Past Medical History:  Diagnosis Date   Diabetes mellitus without complication (HCC)    Hyperlipidemia    Hypertension    Vitamin D deficiency      Assessment: 45 yoM admitted with L pneumothorax s/p chest tube insertion. VATS planned for Monday. Pt now with newly noted AFL, to start IV heparin for anticoagulation. No AC PTA, CBC wnl. SQ heparin given recently so will defer bolus.  Goal of Therapy:  Heparin level 0.3-0.7 units/ml Monitor platelets by anticoagulation protocol: Yes   Plan:  Heparin 1050 units/h Check heparin level in 6h  Fredonia Highland, PharmD, [place], [facility] Clinical Pharmacist [phone] Please check AMION for all [facility] Pharmacy numbers 08/25/2022

## 2022-08-25 NOTE — Consult Note (Signed)
CARDIOLOGY CONSULT NOTE  Patient ID: Jesus Bush MRN: [id number] DOB/AGE: 1945/05/12 77 y.o.  Admit date: 08/22/2022 Primary Physician [name], MD Primary Cardiologist New Chief Complaint  Atrial flutter Requesting  Dr. Randol Kern  HPI:   The patient has a history of COPD and bullous emphysema.Marland Kitchen  He presented to the hospital mostly with epigastric discomfort is found to have large left-sided pneumothorax.  Chest tube was inserted by the ED physician.  He has been seen by cardiothoracic surgery and he is to have a VATS.  However, we have not called because he went into atrial flutter.  He has not had this dysrhythmia before.  This nice gentleman has never been in the hospital before.  He has been told he had some atrial arrhythmias by his primary provider but has not required therapy.  He has been told that he had lung disease and he was actually going to see a pulmonologist but have never established with one yet.  He has is active around the house.  He thinks his symptoms started just midweek.  He did have a little decreased exercise tolerance and dyspnea on exertion before he presented with acute midepigastric pain and acute shortness of breath and was found to have a pneumothorax.  He does not really feel palpitations.  He does not have presyncope or syncope.  He denies any chest pressure, neck or arm discomfort.  He has never had any cardiac workup.  He does have some aortic atherosclerosis on his CTs.   Past Medical History:  Diagnosis Date   Diabetes mellitus without complication (HCC)    Hyperlipidemia    Hypertension    Vitamin D deficiency     Past Surgical History:  Procedure Laterality Date   CATARACT EXTRACTION, BILATERAL Bilateral 2018   Dr. Wayland Denis    Allergies  Allergen Reactions   Ppd [Tuberculin Purified Protein Derivative]     Positive PPD.   Medications Prior to Admission  Medication Sig Dispense Refill Last Dose   aspirin 81 MG tablet  Take 81 mg by mouth daily.   08/22/2022   cetirizine (ZYRTEC ALLERGY) 10 MG tablet Take one tablet nightly for allergies. 90 tablet 1 08/22/2022   Cholecalciferol (VITAMIN D) 2000 UNITS CAPS Take 2 capsules by mouth daily.    08/22/2022   diltiazem (DILACOR XR) 240 MG 24 hr capsule TAKE 1 CAPSULE DAILY FOR BLOOD PRESSURE AND HEART RHYTHM 90 capsule 3 08/22/2022   famotidine (PEPCID) 40 MG tablet Take 1 tablet (40 mg total) by mouth 2 (two) times daily. 90 tablet 3 08/22/2022   losartan (COZAAR) 100 MG tablet TAKE 1 TABLET EVERY DAY FOR BLOOD PRESSURE (Patient taking differently: Take 100 mg by mouth daily. TAKE 1 TABLET EVERY DAY FOR BLOOD PRESSURE) 90 tablet 3 08/22/2022   magnesium gluconate (MAGONATE) 500 MG tablet Take 500 mg by mouth daily.    08/22/2022   metoCLOPramide (REGLAN) 10 MG tablet Take  1 tablet  3 x/ day  with Lunch, Supper & Bedtime for Acid Indigestion & Reflux. 90 tablet 1 08/22/2022   montelukast (SINGULAIR) 10 MG tablet Take 1 tablet (10 mg total) by mouth daily. 30 tablet 2 08/22/2022   pantoprazole (PROTONIX) 40 MG tablet Take  1 tablet  2 x /day  to Prevent Indigestion & Heartburn 180 tablet 3 08/22/2022   rosuvastatin (CRESTOR) 40 MG tablet TAKE 1 TABLET DAILY FOR CHOLESTEROL 90 tablet 3 08/22/2022   zinc gluconate 50 MG tablet Take 50 mg by  mouth daily.   08/22/2022   ACCU-CHEK SOFTCLIX LANCETS lancets Check blood sugar 1 time daily-DX-R73.09 100 each 3    albuterol (VENTOLIN HFA) 108 (90 Base) MCG/ACT inhaler 2 inhalations 15 minutes apart every 4 hours to Rescue Asthma 48 g 3 unknown   Alcohol Swabs (B-D SINGLE USE SWABS REGULAR) PADS Check blood sugar 1 time daily-DX-R73.09. 100 each 3    Blood Glucose Calibration (ACCU-CHEK AVIVA) SOLN Use per box instruction. 1 each 3    blood glucose meter kit and supplies KIT Test blood sugar daily or as directed 1 each 0    Blood Glucose Monitoring Suppl (ACCU-CHEK AVIVA PLUS) w/Device KIT Check blood sugar 1 time daily-DX-R73.09 1 kit 0     fluticasone (FLONASE) 50 MCG/ACT nasal spray 2 sprays each nostril daily 16 g 2 unknown   Fluticasone-Umeclidin-Vilant (TRELEGY ELLIPTA) 200-62.5-25 MCG/ACT AEPB Use  1 Inhalation  Daily  for COPD /Asthma 180 each 3 unknown   glucose blood (ACCU-CHEK AVIVA PLUS) test strip Check blood sugar 1 time daily-R73.09. 100 each 3    ipratropium-albuterol (DUONEB) 0.5-2.5 (3) MG/3ML SOLN USE 1 VIAL IN NEBULIZER EVERY 4 TO 6 HOURS - As Needed 180 mL 11 unknown   Family History  Problem Relation Age of Onset   Heart attack Mother    Heart disease Mother    Heart attack Brother    Hypertension Brother    Diabetes Sister    Heart disease Sister    Heart disease Father    Heart attack Father     Social History   Socioeconomic History   Marital status: Married    Spouse name: Not on file   Number of children: 2   Years of education: Not on file   Highest education level: Not on file  Occupational History   Occupation: retired  Tobacco Use   Smoking status: Former    Packs/day: 1.00    Years: 44.00    Additional pack years: 0.00    Total pack years: 44.00    Types: Cigarettes    Start date: 73    Quit date: 05/30/2022    Years since quitting: 0.2   Smokeless tobacco: Never  Substance and Sexual Activity   Alcohol use: No    Alcohol/week: 0.0 standard drinks of alcohol    Comment: He states he no longer drinks alcohol   Drug use: No   Sexual activity: Not on file  Other Topics Concern   Not on file  Social History Narrative   Not on file   Social Determinants of Health   Financial Resource Strain: Not on file  Food Insecurity: No Food Insecurity (08/22/2022)   Hunger Vital Sign    Worried About Running Out of Food in the Last Year: Never true    Ran Out of Food in the Last Year: Never true  Transportation Needs: No Transportation Needs (08/22/2022)   PRAPARE - Administrator, Civil Service (Medical): No    Lack of Transportation (Non-Medical): No  Physical Activity:  Not on file  Stress: Not on file  Social Connections: Not on file  Intimate Partner Violence: Not At Risk (08/22/2022)   Humiliation, Afraid, Rape, and Kick questionnaire    Fear of Current or Ex-Partner: No    Emotionally Abused: No    Physically Abused: No    Sexually Abused: No     ROS:    As stated in the HPI and negative for all other systems.  Physical Exam:  Blood pressure 129/74, pulse (!) 108, temperature 98 F (36.7 C), temperature source Oral, resp. rate 20, height 5\' 10"  (1.778 m), weight 73.5 kg, SpO2 91 %.  GENERAL:  Well appearing HEENT:  Pupils equal round and reactive, fundi not visualized, oral mucosa unremarkable NECK:  No jugular venous distention, waveform within normal limits, carotid upstroke brisk and symmetric, no bruits, no thyromegaly LYMPHATICS:  No cervical, inguinal adenopathy LUNGS:  Clear to auscultation bilaterally BACK:  No CVA tenderness CHEST:  Unremarkable HEART:  PMI not displaced or sustained,S1 and S2 within normal limits, no S3, no S4, no clicks, no rubs, no murmurs, irregular ABD:  Flat, positive bowel sounds normal in frequency in pitch, no bruits, no rebound, no guarding, no midline pulsatile mass, no hepatomegaly, no splenomegaly EXT:  2 plus pulses throughout, no edema, no cyanosis no clubbing SKIN:  No rashes no nodules NEURO:  Cranial nerves II through XII grossly intact, motor grossly intact throughout PSYCH:  Cognitively intact, oriented to person place and time   Labs: Lab Results  Component Value Date   BUN 12 08/23/2022   Lab Results  Component Value Date   CREATININE 1.06 08/23/2022   Lab Results  Component Value Date   NA 138 08/23/2022   K 4.2 08/23/2022   CL 104 08/23/2022   CO2 25 08/23/2022   No results found for: "TROPONINI" Lab Results  Component Value Date   WBC 8.7 08/23/2022   HGB 13.8 08/23/2022   HCT 41.3 08/23/2022   MCV 96.7 08/23/2022   PLT 248 08/23/2022   Lab Results  Component Value Date    CHOL 151 07/18/2022   HDL 41 07/18/2022   LDLCALC 77 07/18/2022   TRIG 240 (H) 07/18/2022   CHOLHDL 3.7 07/18/2022   Lab Results  Component Value Date   ALT 42 08/22/2022   AST 43 (H) 08/22/2022   ALKPHOS 81 08/22/2022   BILITOT 0.5 08/22/2022      Radiology:   CXR: 1. No residual pneumothorax. 2. No other change from the previous day's exam. Stable left chest tube. Left greater than right lung base opacities suspected to be atelectasis.   EKG: Atrial flutter rate 123, variable conduction, incomplete right bundle branch block, no acute ST-T wave changes.  ASSESSMENT AND PLAN:   Atrial flutter: The patient has atrial flutter with variable conduction.  I am going to start IV heparin.  I will start IV Cardizem for rate control.  He will need Eliquis at least short-term after discharge and I will likely increase his p.o. Cardizem for rate control before discharge.  I do not anticipate any need for cardioversion if we can rate control or if he converts.  Long-term if he has any sustained flutter he would be an ablation candidate.  Echocardiogram is pending.  DM:  He has an A1c of 6.6 and does report that he was on therapy years ago but has not been for a while.   This can be followed by [name], MD  Hypertriglyceridemia: Total cholesterol is 151.  HDL is 41.  Needs diet control carbohydrates for his triglycerides and also control of his sugars.  Tobacco abuse: He says he is quit smoking with this event.  SignedRollene Rotunda 08/25/2022, 3:32 PM

## 2022-08-25 NOTE — Progress Notes (Signed)
ANTICOAGULATION CONSULT NOTE Pharmacy Consult for heparin Indication: atrial fibrillation Brief A/P: Heparin level within goal range Continue Heparin at current rate   Allergies  Allergen Reactions   Ppd [Tuberculin Purified Protein Derivative]     Positive PPD.    Patient Measurements: Height: 5\' 10"  (177.8 cm) Weight: 73.5 kg (162 lb) IBW/kg (Calculated) : 73 Heparin Dosing Weight: 74kg  Vital Signs: Temp: 98.5 F (36.9 C) (06/01 2025) Temp Source: Oral (06/01 2025) BP: 125/63 (06/01 2025) Pulse Rate: 108 (06/01 2025)  Labs: Recent Labs    08/23/22 0423 08/25/22 1530 08/25/22 2240  HGB 13.8 14.5  --   HCT 41.3 43.3  --   PLT 248 272  --   HEPARINUNFRC  --   --  0.40  CREATININE 1.06 1.01  --      Estimated Creatinine Clearance: 63.2 mL/min (by C-G formula based on SCr of 1.01 mg/dL).   Assessment: 77 y.o. male with Afib for heparin  Goal of Therapy:  Heparin level 0.3-0.7 units/ml Monitor platelets by anticoagulation protocol: Yes   Plan:  No change to heparin  Follow-up am labs.   Geannie Risen, PharmD, BCPS  08/25/2022

## 2022-08-25 NOTE — Progress Notes (Addendum)
PROGRESS NOTE    Jesus Bush  WUJ:[id number] DOB: [birth year] DOA: 08/22/2022 PCP: [name], MD    Chief Complaint  Patient presents with   Abdominal Pain    Brief Narrative:   Jesus Bush  is a 77 y.o. male, with past medical history of active tobacco abuse, COPD, hypertension, hyperlipidemia, aortic atherosclerosis, diabetes mellitus, patient presents to ED secondary to complaints of bloating, epigastric pain, reports pain has started earlier this morning around 4 AM, has been intermittent, resolved with breakfast, but happened again late a.m., was radiating to his abdomen and lower chest, and he does report some dyspnea, denies fever, chills.  He denies any fall, no trauma. -NAD patient had CT chest/abdomen, and CTA chest, which did show large left-sided pneumothorax, so chest tube has been inserted by ED physician, with follow-up chest x-ray showing mental and near reexpansion of the left lung, he was admitted for further management.  Assessment & Plan:   Principal Problem:   Pneumothorax Active Problems:   Abnormal glucose (prediabetes)   Essential hypertension   Mixed hyperlipidemia   Esophageal reflux   COPD (chronic obstructive pulmonary disease) (HCC)   Aortic atherosclerosis (HCC)   Hyperthyroidism   Tobacco dependence   Spontaneous left-sided pneumothorax -Most likely in the setting of history of smoking, and COPD. -Chest tube management per PCCM. -Continue with as needed pain medications, pain is controlled -CT surgery consulted for  VATS pleurodesis, plan for surgery on Monday  COPD -Continue with Breo and Incruse -No active wheezing, continue with as needed nebs   Tobacco abuse -He was counseled, will keep on nicotine patch   History of CAD - Resume statin, will resume aspirin when more stable  Addendum 2:50 PM: A flutter -Patient was noted to be tachycardic, given Cardizem IV, once heart rate has been slowed down, it confirms a flutter,  no history of A-fib or a flutter in the past, will obtain 2D echo, will check TSH and free T4 (patient with borderline hyperthyroidism in the past close (, cardiology has been consulted for further recommendations.   GERD -Daughter-in-law reports significant reflux, he is scheduled to follow with GI as an outpatient, plan for next month, did increase his Protonix to 40 mg IV twice daily and keep on Carafate to help with symptoms meanwhile.  Reports reflux is better, likely will need to continue on this regimen at time of discharge.   Hyperlipidemia -Continue with statin   Hypertension -Blood pressure readings despite lowering his Cardizem and losartan, so I will DC losartan and keep only on Cardizem.     Diabetes mellitus - Controlled with diet,  A1c is 6.6 , and will monitor CBGs.  Constipation -Continue with bowel regimen     DVT prophylaxis: Heparin Code Status: Full Family Communication: Discussed with daughter and low at bedside Disposition:   Status is: Inpatient    Consultants:  PCCM CT surgery   Subjective:  He still reports constipation, pain at chest tube insertion site is controlled, reports his reflux is better after starting PPI and Carafate .  Objective: Vitals:   08/25/22 0400 08/25/22 0704 08/25/22 0751 08/25/22 0828  BP: (!) 115/57 128/71    Pulse: 67 72    Resp: 14 19    Temp: 98.5 F (36.9 C) 98.2 F (36.8 C)    TempSrc: Oral Oral Oral   SpO2: 95% 94%  94%  Weight:      Height:        Intake/Output Summary (Last 24  hours) at 08/25/2022 0934 Last data filed at 08/25/2022 0704 Gross per 24 hour  Intake 240 ml  Output --  Net 240 ml   Filed Weights   08/24/22 0916  Weight: 73.5 kg    Examination:  Awake, alert, oriented,  in no apparent distress Symmetrical Chest wall movement, left chest tube present, Rales at the bases RRR,No Gallops,Rubs or new Murmurs, No Parasternal Heave +ve B.Sounds, Abd Soft, No tenderness, No rebound - guarding  or rigidity. No Cyanosis, Clubbing or edema, No new Rash or bruise        Data Reviewed: I have personally reviewed following labs and imaging studies  CBC: Recent Labs  Lab 08/22/22 1248 08/23/22 0423  WBC 10.5 8.7  NEUTROABS 7.6  --   HGB 14.8 13.8  HCT 44.8 41.3  MCV 94.7 96.7  PLT 252 248    Basic Metabolic Panel: Recent Labs  Lab 08/22/22 1248 08/23/22 0423  NA 139 138  K 4.0 4.2  CL 102 104  CO2 26 25  GLUCOSE 147* 147*  BUN 11 12  CREATININE 1.31* 1.06  CALCIUM 9.7 9.2    GFR: Estimated Creatinine Clearance: 60.3 mL/min (by C-G formula based on SCr of 1.06 mg/dL).  Liver Function Tests: Recent Labs  Lab 08/22/22 1248  AST 43*  ALT 42  ALKPHOS 81  BILITOT 0.5  PROT 6.9  ALBUMIN 3.8    CBG: Recent Labs  Lab 08/24/22 0824 08/24/22 1205 08/24/22 1632 08/24/22 2030 08/25/22 0752  GLUCAP 157* 122* 145* 144* 119*     No results found for this or any previous visit (from the past 240 hour(s)).       Radiology Studies: DG Chest Port 1 View  Result Date: 08/24/2022 CLINICAL DATA:  Follow-up of pneumothorax EXAM: PORTABLE CHEST 1 VIEW COMPARISON:  Chest radiograph dated 08/23/2022 FINDINGS: Lines/tubes: Similar position of left pleural catheter. Lungs: Similar low lung volumes with left-greater-than-right basilar patchy opacities. Pleura: Decreased small left pneumothorax. No right pneumothorax. Trace bilateral pleural effusions. Heart/mediastinum: Similar  cardiomediastinal silhouette. Bones: No acute osseous abnormality. IMPRESSION: 1. Decreased small left pneumothorax. 2. Similar low lung volumes with left-greater-than-right basilar patchy opacities, likely atelectasis. 3. Trace bilateral pleural effusions. Electronically Signed   By: Agustin Cree M.D.   On: 08/24/2022 10:25        Scheduled Meds:  diltiazem  180 mg Oral Daily   docusate sodium  100 mg Oral BID   fluticasone furoate-vilanterol  1 puff Inhalation Daily   And    umeclidinium bromide  1 puff Inhalation Daily   heparin  5,000 Units Subcutaneous Q8H   magnesium citrate  0.5 Bottle Oral Once   montelukast  10 mg Oral Daily   nicotine  21 mg Transdermal Daily   [START ON 08/26/2022] pantoprazole  40 mg Oral BID   pantoprazole (PROTONIX) IV  40 mg Intravenous Q12H   polyethylene glycol  17 g Oral BID   rosuvastatin  40 mg Oral Daily   sodium chloride flush  10 mL Intrapleural Q8H   sucralfate  1 g Oral TID WC & HS   Continuous Infusions:   LOS: 3 days      Huey Bienenstock, MD [facility]   To contact the attending provider between 7A-7P or the covering provider during after hours 7P-7A, please log into the web site [email or web address] and access using universal [place] password for that web site. If you do not have the password, please call the hospital operator.  08/25/2022,  9:34 AM

## 2022-08-26 ENCOUNTER — Other Ambulatory Visit: Payer: Self-pay | Admitting: Nurse Practitioner

## 2022-08-26 ENCOUNTER — Inpatient Hospital Stay (HOSPITAL_COMMUNITY): Payer: Medicare HMO

## 2022-08-26 ENCOUNTER — Other Ambulatory Visit (HOSPITAL_COMMUNITY): Payer: Medicare HMO

## 2022-08-26 DIAGNOSIS — I4892 Unspecified atrial flutter: Secondary | ICD-10-CM | POA: Diagnosis not present

## 2022-08-26 DIAGNOSIS — J449 Chronic obstructive pulmonary disease, unspecified: Secondary | ICD-10-CM | POA: Diagnosis not present

## 2022-08-26 DIAGNOSIS — J302 Other seasonal allergic rhinitis: Secondary | ICD-10-CM

## 2022-08-26 DIAGNOSIS — I483 Typical atrial flutter: Secondary | ICD-10-CM | POA: Diagnosis not present

## 2022-08-26 DIAGNOSIS — K219 Gastro-esophageal reflux disease without esophagitis: Secondary | ICD-10-CM | POA: Diagnosis not present

## 2022-08-26 DIAGNOSIS — J9311 Primary spontaneous pneumothorax: Secondary | ICD-10-CM | POA: Diagnosis not present

## 2022-08-26 DIAGNOSIS — J939 Pneumothorax, unspecified: Secondary | ICD-10-CM

## 2022-08-26 LAB — URINALYSIS, ROUTINE W REFLEX MICROSCOPIC
Bilirubin Urine: NEGATIVE
Glucose, UA: NEGATIVE mg/dL
Hgb urine dipstick: NEGATIVE
Ketones, ur: NEGATIVE mg/dL
Leukocytes,Ua: NEGATIVE
Nitrite: NEGATIVE
Protein, ur: NEGATIVE mg/dL
Specific Gravity, Urine: 1.018 (ref 1.005–1.030)
pH: 6 (ref 5.0–8.0)

## 2022-08-26 LAB — BASIC METABOLIC PANEL
Anion gap: 12 (ref 5–15)
BUN: 13 mg/dL (ref 8–23)
CO2: 24 mmol/L (ref 22–32)
Calcium: 8.9 mg/dL (ref 8.9–10.3)
Chloride: 99 mmol/L (ref 98–111)
Creatinine, Ser: 0.92 mg/dL (ref 0.61–1.24)
GFR, Estimated: >60 mL/min (ref >60)
Glucose, Bld: 144 mg/dL — ABNORMAL HIGH (ref 70–99)
Potassium: 4 mmol/L (ref 3.5–5.1)
Sodium: 135 mmol/L (ref 135–145)

## 2022-08-26 LAB — ECHOCARDIOGRAM COMPLETE
Area-P 1/2: 3.12 cm2
Height: 70 in
S' Lateral: 3.4 cm
Weight: 2592 oz

## 2022-08-26 LAB — GLUCOSE, CAPILLARY
Glucose-Capillary: 124 mg/dL — ABNORMAL HIGH (ref 70–99)
Glucose-Capillary: 132 mg/dL — ABNORMAL HIGH (ref 70–99)
Glucose-Capillary: 142 mg/dL — ABNORMAL HIGH (ref 70–99)
Glucose-Capillary: 120 mg/dL — ABNORMAL HIGH (ref 70–99)

## 2022-08-26 LAB — CBC
HCT: 41.5 % (ref 39.0–52.0)
Hemoglobin: 13.7 g/dL (ref 13.0–17.0)
MCH: 31.6 pg (ref 26.0–34.0)
MCHC: 33 g/dL (ref 30.0–36.0)
MCV: 95.8 fL (ref 80.0–100.0)
Platelets: 224 10*3/uL (ref 150–400)
RBC: 4.33 MIL/uL (ref 4.22–5.81)
RDW: 12.7 % (ref 11.5–15.5)
WBC: 9.1 10*3/uL (ref 4.0–10.5)
nRBC: 0 % (ref 0.0–0.2)

## 2022-08-26 LAB — TYPE AND SCREEN
ABO/RH(D): A NEG
Antibody Screen: NEGATIVE

## 2022-08-26 LAB — HEPARIN LEVEL (UNFRACTIONATED): Heparin Unfractionated: 0.7 IU/mL (ref 0.30–0.70)

## 2022-08-26 LAB — APTT: aPTT: 39 seconds — ABNORMAL HIGH (ref 24–36)

## 2022-08-26 LAB — PROTIME-INR
INR: 1.1 (ref 0.8–1.2)
Prothrombin Time: 14 seconds (ref 11.4–15.2)

## 2022-08-26 LAB — ABO/RH: ABO/RH(D): A NEG

## 2022-08-26 LAB — SARS CORONAVIRUS 2 BY RT PCR: SARS Coronavirus 2 by RT PCR: NEGATIVE

## 2022-08-26 MED ORDER — CEFAZOLIN SODIUM-DEXTROSE 2-4 GM/100ML-% IV SOLN
2.0000 g | INTRAVENOUS | Status: AC
  Administered 2022-08-27: 2 g via INTRAVENOUS
  Filled 2022-08-26: qty 100

## 2022-08-26 NOTE — Progress Notes (Addendum)
ANTICOAGULATION CONSULT NOTE  Pharmacy Consult for heparin Indication: atrial fibrillation  Allergies  Allergen Reactions   Ppd [Tuberculin Purified Protein Derivative]     Positive PPD.    Patient Measurements: Height: 5\' 10"  (177.8 cm) Weight: 73.5 kg (162 lb) IBW/kg (Calculated) : 73 Heparin Dosing Weight: 74kg  Vital Signs: Temp: 98.2 F (36.8 C) (06/02 0745) Temp Source: Oral (06/02 0745) BP: 126/66 (06/02 0745) Pulse Rate: 88 (06/02 0745)  Labs: Recent Labs    08/25/22 1530 08/25/22 2240 08/26/22 0324  HGB 14.5  --  13.7  HCT 43.3  --  41.5  PLT 272  --  224  HEPARINUNFRC  --  0.40 0.70  CREATININE 1.01  --  0.92     Estimated Creatinine Clearance: 69.4 mL/min (by C-G formula based on SCr of 0.92 mg/dL).   Medical History: Past Medical History:  Diagnosis Date   Diabetes mellitus without complication (HCC)    Hyperlipidemia    Hypertension    Vitamin D deficiency      Assessment: 12 yoM admitted with L pneumothorax s/p chest tube insertion. VATS planned for Monday. Pt now with newly noted AFL, to start IV heparin for anticoagulation. No AC PTA, CBC wnl. SQ heparin given recently so will defer bolus.  Daily heparin level therapeutic this AM at 0.70 at 1050 units/hr.  Will decrease by 50 units/hr to prevent further increase as 0.70 is the top end of goal. No concerns or issues per RN. Hemoglobin and hematocrit stable and platelets within normal limits.   Goal of Therapy:  Heparin level 0.3-0.7 units/ml Monitor platelets by anticoagulation protocol: Yes   Plan:  Decrease heparin to 1000 units/h - no new level indicated  Check daily CBC and daily heparin level  Monitor for signs and symptoms of bleeding   Blane Ohara, PharmD  PGY1 Pharmacy Resident

## 2022-08-26 NOTE — Progress Notes (Incomplete)
Rounding Note    Patient Name: Jesus Bush Date of Encounter: 08/27/2022  [facility] Cardiologist: None   Subjective   Taken for VATs this AM Converted to NSR on telemetry with episodes of bigeminy  Inpatient Medications    Scheduled Meds:  chlorhexidine       [MAR Hold] docusate sodium  100 mg Oral BID   [MAR Hold] fluticasone furoate-vilanterol  1 puff Inhalation Daily   And   [MAR Hold] umeclidinium bromide  1 puff Inhalation Daily   [MAR Hold] montelukast  10 mg Oral Daily   [MAR Hold] nicotine  21 mg Transdermal Daily   [MAR Hold] pantoprazole  40 mg Oral BID   [MAR Hold] polyethylene glycol  17 g Oral BID   [MAR Hold] rosuvastatin  40 mg Oral Daily   [MAR Hold] sodium chloride flush  10 mL Intrapleural Q8H   Continuous Infusions:   ceFAZolin (ANCEF) IV     [MAR Hold] diltiazem (CARDIZEM) infusion 10 mg/hr (08/27/22 0601)   PRN Meds: [MAR Hold] acetaminophen **OR** [MAR Hold] acetaminophen, [MAR Hold] albuterol, [MAR Hold] albuterol, [MAR Hold] alum & mag hydroxide-simeth, chlorhexidine, [MAR Hold] hydrALAZINE, [MAR Hold] ipratropium-albuterol, [MAR Hold]  morphine injection, [MAR Hold] ondansetron **OR** [MAR Hold] ondansetron (ZOFRAN) IV, [MAR Hold] oxyCODONE   Vital Signs    Vitals:   08/27/22 0500 08/27/22 0729 08/27/22 0755 08/27/22 0900  BP: 132/62   122/66  Pulse: 74   93  Resp: 17   (!) 25  Temp:      TempSrc:   Oral   SpO2: 93% 94% 91% 94%  Weight:      Height:        Intake/Output Summary (Last 24 hours) at 08/27/2022 1033 Last data filed at 08/27/2022 0500 Gross per 24 hour  Intake --  Output 250 ml  Net -250 ml      08/24/2022    9:16 AM 07/18/2022    3:32 PM 06/01/2022   11:45 AM  Last 3 Weights  Weight (lbs) 162 lb 165 lb 12.8 oz 160 lb  Weight (kg) 73.483 kg 75.206 kg 72.576 kg      Telemetry    NSR with runs of bigemny - Personally Reviewed  ECG    No new tracing - Personally Reviewed  Physical Exam   GEN: No  acute distress.   Neck: No JVD Cardiac: RRR, no murmurs, rubs, or gallops.  Respiratory: Diminished but clear. Left CT in place GI: Soft, nontender, non-distended  MS: No edema; No deformity. Neuro:  Nonfocal  Psych: Normal affect   Labs    High Sensitivity Troponin:   Recent Labs  Lab 08/22/22 1248 08/22/22 1414  TROPONINIHS 9 8     Chemistry Recent Labs  Lab 08/22/22 1248 08/23/22 0423 08/25/22 1530 08/26/22 0324  NA 139 138 135 135  K 4.0 4.2 4.1 4.0  CL 102 104 98 99  CO2 26 25 25 24   GLUCOSE 147* 147* 142* 144*  BUN 11 12 12 13   CREATININE 1.31* 1.06 1.01 0.92  CALCIUM 9.7 9.2 9.9 8.9  MG  --   --  2.3  --   PROT 6.9  --   --   --   ALBUMIN 3.8  --   --   --   AST 43*  --   --   --   ALT 42  --   --   --   ALKPHOS 81  --   --   --  BILITOT 0.5  --   --   --   GFRNONAA 56* >60 >60 >60  ANIONGAP 11 9 12 12     Lipids No results for input(s): "CHOL", "TRIG", "HDL", "LABVLDL", "LDLCALC", "CHOLHDL" in the last 168 hours.  Hematology Recent Labs  Lab 08/25/22 1530 08/26/22 0324 08/27/22 0537  WBC 16.5* 9.1 10.5  RBC 4.54 4.33 4.13*  HGB 14.5 13.7 13.1  HCT 43.3 41.5 39.1  MCV 95.4 95.8 94.7  MCH 31.9 31.6 31.7  MCHC 33.5 33.0 33.5  RDW 12.8 12.7 12.6  PLT 272 224 252   Thyroid  Recent Labs  Lab 08/25/22 1530  TSH 1.481  FREET4 1.06    BNPNo results for input(s): "BNP", "PROBNP" in the last 168 hours.  DDimer No results for input(s): "DDIMER" in the last 168 hours.   Radiology    DG Chest 2 View  Result Date: 08/26/2022 CLINICAL DATA:  Pneumothorax, chest tube in place EXAM: CHEST - 2 VIEW COMPARISON:  08/25/2022 FINDINGS: Frontal and lateral views of the chest demonstrate stable pigtail drainage catheter within the left anterior lower hemithorax. Cardiac silhouette is unchanged. Persistent bibasilar hypoventilatory changes. There may be trace bilateral pleural effusions, left greater than right, not appreciably changed. No evidence of  pneumothorax. No acute bony abnormality. IMPRESSION: 1. Stable indwelling left chest tube. No evidence of residual or recurrent pneumothorax. 2. Bibasilar hypoventilatory changes and trace bilateral pleural effusions, not appreciably changed since prior exam. Electronically Signed   By: Sharlet Salina M.D.   On: 08/26/2022 19:38   ECHOCARDIOGRAM COMPLETE  Result Date: 08/26/2022    ECHOCARDIOGRAM REPORT   Patient Name:   Jesus Bush Date of Exam: 08/26/2022 Medical Rec #:  [id number]       Height:       70.0 in Accession #:    [id number]      Weight:       162.0 lb Date of Birth:  04-Oct-1945      BSA:          1.909 m Patient Age:    77 years        BP:           126/66 mmHg Patient Gender: M               HR:           73 bpm. Exam Location:  Inpatient Procedure: 2D Echo, Color Doppler and Cardiac Doppler Indications:    atrial flutter  History:        Patient has no prior history of Echocardiogram examinations.                 COPD, Arrythmias:bigeminy and PVC; Risk Factors:Hypertension,                 Dyslipidemia and Current Smoker.  Sonographer:    [name] RDCS Referring Phys: 53 DAWOOD S [place] IMPRESSIONS  1. Left ventricular ejection fraction, by estimation, is 60 to 65%. The left ventricle has normal function. The left ventricle has no regional wall motion abnormalities. There is mild left ventricular hypertrophy. Left ventricular diastolic parameters are indeterminate.  2. Right ventricular systolic function is normal. The right ventricular size is normal. There is normal pulmonary artery systolic pressure. The estimated right ventricular systolic pressure is 28.8 mmHg.  3. The mitral valve is normal in structure. No evidence of mitral valve regurgitation.  4. The aortic valve is tricuspid. Aortic valve regurgitation is not visualized. Aortic valve  sclerosis/calcification is present, without any evidence of aortic stenosis.  5. The inferior vena cava is normal in size with greater than  50% respiratory variability, suggesting right atrial pressure of 3 mmHg. FINDINGS  Left Ventricle: Left ventricular ejection fraction, by estimation, is 60 to 65%. The left ventricle has normal function. The left ventricle has no regional wall motion abnormalities. The left ventricular internal cavity size was normal in size. There is  mild left ventricular hypertrophy. Left ventricular diastolic parameters are indeterminate. Right Ventricle: The right ventricular size is normal. No increase in right ventricular wall thickness. Right ventricular systolic function is normal. There is normal pulmonary artery systolic pressure. The tricuspid regurgitant velocity is 2.54 m/s, and  with an assumed right atrial pressure of 3 mmHg, the estimated right ventricular systolic pressure is 28.8 mmHg. Left Atrium: Left atrial size was normal in size. Right Atrium: Right atrial size was normal in size. Pericardium: Trivial pericardial effusion is present. Mitral Valve: The mitral valve is normal in structure. No evidence of mitral valve regurgitation. Tricuspid Valve: The tricuspid valve is normal in structure. Tricuspid valve regurgitation is trivial. Aortic Valve: The aortic valve is tricuspid. Aortic valve regurgitation is not visualized. Aortic valve sclerosis/calcification is present, without any evidence of aortic stenosis. Pulmonic Valve: The pulmonic valve was not well visualized. Pulmonic valve regurgitation is not visualized. Aorta: The aortic root and ascending aorta are structurally normal, with no evidence of dilitation. Venous: The inferior vena cava is normal in size with greater than 50% respiratory variability, suggesting right atrial pressure of 3 mmHg. IAS/Shunts: The interatrial septum was not well visualized.  LEFT VENTRICLE PLAX 2D LVIDd:         4.50 cm   Diastology LVIDs:         3.40 cm   LV e' medial:    6.42 cm/s LV PW:         1.10 cm   LV E/e' medial:  8.6 LV IVS:        1.10 cm   LV e' lateral:   7.72  cm/s LVOT diam:     2.10 cm   LV E/e' lateral: 7.2 LV SV:         49 LV SV Index:   26 LVOT Area:     3.46 cm  RIGHT VENTRICLE             IVC RV Basal diam:  2.60 cm     IVC diam: 1.70 cm RV S prime:     15.40 cm/s TAPSE (M-mode): 1.9 cm LEFT ATRIUM           Index        RIGHT ATRIUM           Index LA diam:      4.00 cm 2.10 cm/m   RA Area:     13.30 cm LA Vol (A4C): 44.4 ml 23.26 ml/m  RA Volume:   28.50 ml  14.93 ml/m  AORTIC VALVE LVOT Vmax:   79.20 cm/s LVOT Vmean:  51.400 cm/s LVOT VTI:    0.142 m  AORTA Ao Root diam: 3.00 cm Ao Asc diam:  3.10 cm MITRAL VALVE               TRICUSPID VALVE MV Area (PHT): 3.12 cm    TR Peak grad:   25.8 mmHg MV Decel Time: 243 msec    TR Vmax:        254.00 cm/s MV E velocity: 55.50  cm/s MV A velocity: 46.00 cm/s  SHUNTS MV E/A ratio:  1.21        Systemic VTI:  0.14 m                            Systemic Diam: 2.10 cm Epifanio Lesches MD Electronically signed by Epifanio Lesches MD Signature Date/Time: 08/26/2022/12:35:35 PM    Final     Cardiac Studies   Cardiac Studies & Procedures       ECHOCARDIOGRAM  ECHOCARDIOGRAM COMPLETE 08/26/2022  Narrative ECHOCARDIOGRAM REPORT    Patient Name:   Jesus Bush Date of Exam: 08/26/2022 Medical Rec #:  [id number]       Height:       70.0 in Accession #:    [id number]      Weight:       162.0 lb Date of Birth:  1945/04/18      BSA:          1.909 m Patient Age:    76 years        BP:           126/66 mmHg Patient Gender: M               HR:           73 bpm. Exam Location:  Inpatient  Procedure: 2D Echo, Color Doppler and Cardiac Doppler  Indications:    atrial flutter  History:        Patient has no prior history of Echocardiogram examinations. COPD, Arrythmias:bigeminy and PVC; Risk Factors:Hypertension, Dyslipidemia and Current Smoker.  Sonographer:    [name] RDCS Referring Phys: 50 DAWOOD S [place]  IMPRESSIONS   1. Left ventricular ejection fraction, by estimation, is  60 to 65%. The left ventricle has normal function. The left ventricle has no regional wall motion abnormalities. There is mild left ventricular hypertrophy. Left ventricular diastolic parameters are indeterminate. 2. Right ventricular systolic function is normal. The right ventricular size is normal. There is normal pulmonary artery systolic pressure. The estimated right ventricular systolic pressure is 28.8 mmHg. 3. The mitral valve is normal in structure. No evidence of mitral valve regurgitation. 4. The aortic valve is tricuspid. Aortic valve regurgitation is not visualized. Aortic valve sclerosis/calcification is present, without any evidence of aortic stenosis. 5. The inferior vena cava is normal in size with greater than 50% respiratory variability, suggesting right atrial pressure of 3 mmHg.  FINDINGS Left Ventricle: Left ventricular ejection fraction, by estimation, is 60 to 65%. The left ventricle has normal function. The left ventricle has no regional wall motion abnormalities. The left ventricular internal cavity size was normal in size. There is mild left ventricular hypertrophy. Left ventricular diastolic parameters are indeterminate.  Right Ventricle: The right ventricular size is normal. No increase in right ventricular wall thickness. Right ventricular systolic function is normal. There is normal pulmonary artery systolic pressure. The tricuspid regurgitant velocity is 2.54 m/s, and with an assumed right atrial pressure of 3 mmHg, the estimated right ventricular systolic pressure is 28.8 mmHg.  Left Atrium: Left atrial size was normal in size.  Right Atrium: Right atrial size was normal in size.  Pericardium: Trivial pericardial effusion is present.  Mitral Valve: The mitral valve is normal in structure. No evidence of mitral valve regurgitation.  Tricuspid Valve: The tricuspid valve is normal in structure. Tricuspid valve regurgitation is trivial.  Aortic Valve: The aortic  valve is tricuspid. Aortic valve  regurgitation is not visualized. Aortic valve sclerosis/calcification is present, without any evidence of aortic stenosis.  Pulmonic Valve: The pulmonic valve was not well visualized. Pulmonic valve regurgitation is not visualized.  Aorta: The aortic root and ascending aorta are structurally normal, with no evidence of dilitation.  Venous: The inferior vena cava is normal in size with greater than 50% respiratory variability, suggesting right atrial pressure of 3 mmHg.  IAS/Shunts: The interatrial septum was not well visualized.   LEFT VENTRICLE PLAX 2D LVIDd:         4.50 cm   Diastology LVIDs:         3.40 cm   LV e' medial:    6.42 cm/s LV PW:         1.10 cm   LV E/e' medial:  8.6 LV IVS:        1.10 cm   LV e' lateral:   7.72 cm/s LVOT diam:     2.10 cm   LV E/e' lateral: 7.2 LV SV:         49 LV SV Index:   26 LVOT Area:     3.46 cm   RIGHT VENTRICLE             IVC RV Basal diam:  2.60 cm     IVC diam: 1.70 cm RV S prime:     15.40 cm/s TAPSE (M-mode): 1.9 cm  LEFT ATRIUM           Index        RIGHT ATRIUM           Index LA diam:      4.00 cm 2.10 cm/m   RA Area:     13.30 cm LA Vol (A4C): 44.4 ml 23.26 ml/m  RA Volume:   28.50 ml  14.93 ml/m AORTIC VALVE LVOT Vmax:   79.20 cm/s LVOT Vmean:  51.400 cm/s LVOT VTI:    0.142 m  AORTA Ao Root diam: 3.00 cm Ao Asc diam:  3.10 cm  MITRAL VALVE               TRICUSPID VALVE MV Area (PHT): 3.12 cm    TR Peak grad:   25.8 mmHg MV Decel Time: 243 msec    TR Vmax:        254.00 cm/s MV E velocity: 55.50 cm/s MV A velocity: 46.00 cm/s  SHUNTS MV E/A ratio:  1.21        Systemic VTI:  0.14 m Systemic Diam: 2.10 cm  Epifanio Lesches MD Electronically signed by Epifanio Lesches MD Signature Date/Time: 08/26/2022/12:35:35 PM    Final              Patient Profile     77 y.o. male with COPD with bullous emphysema, tobacco abuse, HTN, HLD, DMII who presented with  epigastric discomfort and bloating found to have a large left sided pneumothorax s/p CT placement with course complicated by Aflutter/fib for which Cardiology was consulted.  Assessment & Plan    #pAFlutter/Fib: -Newly diagnosed this admission in the setting of PTX -Converted back to NSR overnight -Will change to oral dilt pending BP after OR -Avoiding BB due to significant COPD -On heparin gtt for [facility]; plan for apixaban long-term once stable from surgical perspective to resume AC  #Pneumothorax #COPD with Bullous Emphysema: -S/p chest tube placement -Planned for VATs with CT surgery today  #HLD: -Continue crestor 40mg  daily  #DMII: -Management per primary      For  questions or updates, please contact [facility] Please consult [email or web address] for contact info under        Signed, Meriam Sprague, MD  08/27/2022, 10:33 AM

## 2022-08-26 NOTE — Plan of Care (Signed)

## 2022-08-26 NOTE — Progress Notes (Addendum)
PROGRESS NOTE    Jesus Bush  ZOX:[id number] DOB: 13-Apr-1945 DOA: 08/22/2022 PCP: Jesus Cowboy, MD    Chief Complaint  Patient presents with   Abdominal Pain    Brief Narrative:   Jesus Bush  is a 77 y.o. male, with past medical history of active tobacco abuse, COPD, hypertension, hyperlipidemia, aortic atherosclerosis, diabetes mellitus, patient presents to ED secondary to complaints of bloating, epigastric pain, reports pain has started earlier this morning around 4 AM, has been intermittent, resolved with breakfast, but happened again late a.m., was radiating to his abdomen and lower chest, and he does report some dyspnea, denies fever, chills.  He denies any fall, no trauma. -CT chest/abdomen, and CTA chest, which did show large left-sided pneumothorax, so chest tube has been inserted by ED physician, PCCM were consulted, as well CT surgery's been consulted for VATS pleurodesis, hospital stay has been complicated by patient developing new onset a flutter for which he has been started on heparin gtt. and Cardizem gtt. per cardiology.  Assessment & Plan:   Principal Problem:   Pneumothorax Active Problems:   Abnormal glucose (prediabetes)   Essential hypertension   Mixed hyperlipidemia   Esophageal reflux   COPD (chronic obstructive pulmonary disease) (HCC)   Aortic atherosclerosis (HCC)   Hyperthyroidism   Tobacco dependence   Spontaneous left-sided pneumothorax -Most likely in the setting of history of smoking, and COPD. -Chest tube management per PCCM. -Continue with as needed pain medications, pain is controlled -CT surgery consulted for  VATS pleurodesis, plan for surgery on Monday  COPD -Continue with Breo and Incruse -No active wheezing, continue with as needed nebs   Tobacco abuse -He was counseled, will keep on nicotine patch   History of CAD - Resume statin, will resume aspirin when more stable  New onset a flutter -Developed a flutter  08/25/2022, cardiology input greatly appreciated he is started on Cardizem drip, heparin drip, and will need Eliquis.. -TSH within normal limit. -2D echo is pending   GERD -Daughter-in-law reports significant reflux, he is scheduled to follow with GI as an outpatient, plan for next month, did increase his Protonix to 40 mg IV twice daily and short course Carafate to help with symptoms meanwhile.  Reports reflux is better, likely will need to continue on this regimen at time of discharge.   Hyperlipidemia -Continue with statin   Hypertension -Blood pressure readings despite lowering his Cardizem and losartan, so I will DC losartan and keep only on Cardizem.     Diabetes mellitus - Controlled with diet,  A1c is 6.6 , and will monitor CBGs.  Constipation -Continue with bowel regimen     DVT prophylaxis: Heparin Code Status: Full Family Communication: None at bedside Disposition:   Status is: Inpatient    Consultants:  PCCM CT surgery Cardiology   Subjective:  Still Reports constipation, but otherwise reports he is feeling better.  Objective: Vitals:   08/26/22 0600 08/26/22 0700 08/26/22 0745 08/26/22 0818  BP: 106/65 116/69 126/66   Pulse: 85 89 88   Resp: 14 17 18    Temp:  98 F (36.7 C) 98.2 F (36.8 C)   TempSrc:  Oral Oral   SpO2: 95% 93% 94% 95%  Weight:      Height:        Intake/Output Summary (Last 24 hours) at 08/26/2022 0956 Last data filed at 08/26/2022 0711 Gross per 24 hour  Intake 494.02 ml  Output 300 ml  Net 194.02 ml   [employer]  08/24/22 0916  Weight: 73.5 kg    Examination:  Awake Alert, Oriented X 3, No new F.N deficits, Normal affect Symmetrical Chest wall movement, Good air movement bilaterally, CTAB RRR,No Gallops,Rubs or new Murmurs, No Parasternal Heave +ve B.Sounds, Abd Soft, No tenderness, No rebound - guarding or rigidity. No Cyanosis, Clubbing or edema, No new Rash or bruise        Data Reviewed: I have personally  reviewed following labs and imaging studies  CBC: Recent Labs  Lab 08/22/22 1248 08/23/22 0423 08/25/22 1530 08/26/22 0324  WBC 10.5 8.7 16.5* 9.1  NEUTROABS 7.6  --   --   --   HGB 14.8 13.8 14.5 13.7  HCT 44.8 41.3 43.3 41.5  MCV 94.7 96.7 95.4 95.8  PLT 252 248 272 224    Basic Metabolic Panel: Recent Labs  Lab 08/22/22 1248 08/23/22 0423 08/25/22 1530 08/26/22 0324  NA 139 138 135 135  K 4.0 4.2 4.1 4.0  CL 102 104 98 99  CO2 26 25 25 24   GLUCOSE 147* 147* 142* 144*  BUN 11 12 12 13   CREATININE 1.31* 1.06 1.01 0.92  CALCIUM 9.7 9.2 9.9 8.9  MG  --   --  2.3  --     GFR: Estimated Creatinine Clearance: 69.4 mL/min (by C-G formula based on SCr of 0.92 mg/dL).  Liver Function Tests: Recent Labs  Lab 08/22/22 1248  AST 43*  ALT 42  ALKPHOS 81  BILITOT 0.5  PROT 6.9  ALBUMIN 3.8    CBG: Recent Labs  Lab 08/25/22 0752 08/25/22 1155 08/25/22 1631 08/25/22 2026 08/26/22 0742  GLUCAP 119* 122* 144* 132* 124*     No results found for this or any previous visit (from the past 240 hour(s)).       Radiology Studies: DG CHEST PORT 1 VIEW  Result Date: 08/25/2022 CLINICAL DATA:  Follow-up left pneumothorax and left chest tube. EXAM: PORTABLE CHEST 1 VIEW COMPARISON:  08/24/2022 and older exams.  CT, 08/22/2022. FINDINGS: Stable left inferior hemithorax pigtail chest tube. No pneumothorax. Left greater than right lung base opacities, consistent with atelectasis, stable from the previous day's exam. Remainder of the lungs is clear. IMPRESSION: 1. No residual pneumothorax. 2. No other change from the previous day's exam. Stable left chest tube. Left greater than right lung base opacities suspected to be atelectasis. Electronically Signed   By: Amie Portland M.D.   On: 08/25/2022 09:49        Scheduled Meds:  docusate sodium  100 mg Oral BID   fluticasone furoate-vilanterol  1 puff Inhalation Daily   And   umeclidinium bromide  1 puff Inhalation Daily    montelukast  10 mg Oral Daily   nicotine  21 mg Transdermal Daily   pantoprazole  40 mg Oral BID   polyethylene glycol  17 g Oral BID   rosuvastatin  40 mg Oral Daily   sodium chloride flush  10 mL Intrapleural Q8H   Continuous Infusions:  diltiazem (CARDIZEM) infusion 10 mg/hr (08/26/22 0412)   heparin 1,000 Units/hr (08/26/22 0937)     LOS: 4 days      Huey Bienenstock, MD [facility]   To contact the attending provider between 7A-7P or the covering provider during after hours 7P-7A, please log into the web site [email or web address] and access using universal Gray password for that web site. If you do not have the password, please call the hospital operator.  08/26/2022, 9:56 AM

## 2022-08-26 NOTE — Progress Notes (Signed)
Mobility Specialist Progress Note   08/26/22 1020  Mobility  Activity Ambulated with assistance in room;Transferred from chair to bed  Level of Assistance Standby assist, set-up cues, supervision of patient - no hands on  Assistive Device None  Distance Ambulated (ft) 10 ft  Range of Motion/Exercises Active;All extremities  Activity Response Tolerated well   Patient received in recliner requesting assistance back to bed. Ambulated short distance in room  to recliner chair without assistive device. Tolerated without complaint or incident. Was left in supine with all needs met, call bell in reach.   [place] Luciann Gossett, BS EXP Mobility Specialist Please contact via SecureChat or Rehab office at [phone]

## 2022-08-26 NOTE — Progress Notes (Addendum)
      [address]       [place] [address]             [phone]       XI ROBOTIC ASSISTED THORACOSCOPY-WEDGE RESECTION (Left)  Subjective:  Patient states he is feeling better.  Objective: Vital signs in last 24 hours: Temp:  [98 F (36.7 C)-99.3 F (37.4 C)] 98.2 F (36.8 C) (06/02 0745) Pulse Rate:  [77-108] 88 (06/02 0745) Cardiac Rhythm: Atrial flutter (06/01 1941) Resp:  [13-20] 18 (06/02 0745) BP: (104-134)/(61-86) 126/66 (06/02 0745) SpO2:  [90 %-95 %] 94 % (06/02 0745)  Intake/Output from previous day: 06/01 0701 - 06/02 0700 In: 494 [I.V.:254] Out: 300 [Urine:300] Intake/Output this shift: Total I/O In: 240  Out: -   General appearance: alert, cooperative, and no distress Heart: regular rate and rhythm Lungs: clear to auscultation bilaterally Abdomen: soft, non-tender; bowel sounds normal; no masses,  no organomegaly Extremities: extremities normal, atraumatic, no cyanosis or edema Wound: clean and dry  Lab Results: Recent Labs    08/25/22 1530 08/26/22 0324  WBC 16.5* 9.1  HGB 14.5 13.7  HCT 43.3 41.5  PLT 272 224   BMET:  Recent Labs    08/25/22 1530 08/26/22 0324  NA 135 135  K 4.1 4.0  CL 98 99  CO2 25 24  GLUCOSE 142* 144*  BUN 12 13  CREATININE 1.01 0.92  CALCIUM 9.9 8.9    PT/INR: No results for input(s): "LABPROT", "INR" in the last 72 hours. ABG No results found for: "PHART", "HCO3", "TCO2", "ACIDBASEDEF", "O2SAT" CBG (last 3)  Recent Labs    08/25/22 1631 08/25/22 2026 08/26/22 0742  GLUCAP 144* 132* 124*    Assessment/Plan: S/P Procedure(s) (LRB): XI ROBOTIC ASSISTED THORACOSCOPY-WEDGE RESECTION (Left)  Spontaneous Pneumothorax- due to bullous emphysema.Marland Kitchen air leak is resolved, keep CT on suction.. plan for Robotic VATS tomorrow second case   LOS: 4 days    Lowella Dandy, PA-C 08/26/2022 Patient seen and examined, agree with above No air leak at present Remains in atrial flutter with  controlled rate on diltiazem On heparin drip- will need to hold for OR, ordered to stop at 0400 tomorrow  Viviann Spare C. Dorris Fetch, MD [facility] [phone]

## 2022-08-26 NOTE — Progress Notes (Signed)
  Echocardiogram 2D Echocardiogram has been performed.  [name] 08/26/2022, 11:27 AM

## 2022-08-26 NOTE — Progress Notes (Signed)
   Patient Name: Jesus Bush Date of Encounter: 08/26/2022 [facility] Cardiologist: None   Interval Summary   Denies new pain or SOB.   Does not notice his atrial flutter.   Vital Signs   Vitals:   08/26/22 0600 08/26/22 0700 08/26/22 0745 08/26/22 0818  BP: 106/65 116/69 126/66   Pulse: 85 89 88   Resp: 14 17 18    Temp:  98 F (36.7 C) 98.2 F (36.8 C)   TempSrc:  Oral Oral   SpO2: 95% 93% 94% 95%  Weight:      Height:        Intake/Output Summary (Last 24 hours) at 08/26/2022 0914 Last data filed at 08/26/2022 0711 Gross per 24 hour  Intake 494.02 ml  Output 300 ml  Net 194.02 ml      08/24/2022    9:16 AM 07/18/2022    3:32 PM 06/01/2022   11:45 AM  Last 3 Weights  Weight (lbs) 162 lb 165 lb 12.8 oz 160 lb  Weight (kg) 73.483 kg 75.206 kg 72.576 kg      Telemetry/ECG    Atrial flutter with variable conduction.  PVCs.  - Personally Reviewed  Physical Exam  GEN: No acute distress.   Neck: No JVD Cardiac: Irregular RR, no murmurs, rubs, or gallops.  Respiratory: Clear to auscultation bilaterally. GI: Soft, nontender, non-distended  MS: No edema  Assessment & Plan    Atrial flutter:  Rate has been controlled with IV Dilt.  He is no heparin IV.  Will need Eliquis post op.   Convert to PO Cardizem post up and we will plan rate control for now.  Ultimately might need cardioversion and possible ablation.  Predominant rhythm looks to be flutter although there was appeared to be some PAFib noted as well.    For questions or updates, please contact [facility] Please consult [email or web address] for contact info under        Signed, Rollene Rotunda, MD

## 2022-08-27 ENCOUNTER — Inpatient Hospital Stay (HOSPITAL_COMMUNITY): Payer: Medicare HMO

## 2022-08-27 ENCOUNTER — Encounter (HOSPITAL_COMMUNITY)
Admission: EM | Disposition: A | Discharge status: Home / Self Care | Payer: Self-pay | Source: Home / Self Care | Attending: Internal Medicine

## 2022-08-27 ENCOUNTER — Inpatient Hospital Stay (HOSPITAL_COMMUNITY): Payer: Medicare HMO | Admitting: Certified Registered Nurse Anesthetist

## 2022-08-27 ENCOUNTER — Other Ambulatory Visit: Payer: Self-pay | Admitting: Nurse Practitioner

## 2022-08-27 ENCOUNTER — Other Ambulatory Visit: Payer: Self-pay

## 2022-08-27 ENCOUNTER — Encounter (HOSPITAL_COMMUNITY): Payer: Self-pay | Admitting: Internal Medicine

## 2022-08-27 DIAGNOSIS — J939 Pneumothorax, unspecified: Secondary | ICD-10-CM | POA: Diagnosis not present

## 2022-08-27 DIAGNOSIS — K219 Gastro-esophageal reflux disease without esophagitis: Secondary | ICD-10-CM | POA: Diagnosis not present

## 2022-08-27 DIAGNOSIS — I10 Essential (primary) hypertension: Secondary | ICD-10-CM | POA: Diagnosis not present

## 2022-08-27 DIAGNOSIS — J449 Chronic obstructive pulmonary disease, unspecified: Secondary | ICD-10-CM | POA: Diagnosis not present

## 2022-08-27 DIAGNOSIS — J302 Other seasonal allergic rhinitis: Secondary | ICD-10-CM

## 2022-08-27 DIAGNOSIS — Z87891 Personal history of nicotine dependence: Secondary | ICD-10-CM | POA: Diagnosis not present

## 2022-08-27 DIAGNOSIS — E782 Mixed hyperlipidemia: Secondary | ICD-10-CM | POA: Diagnosis not present

## 2022-08-27 DIAGNOSIS — F17200 Nicotine dependence, unspecified, uncomplicated: Secondary | ICD-10-CM | POA: Diagnosis not present

## 2022-08-27 DIAGNOSIS — J9383 Other pneumothorax: Secondary | ICD-10-CM | POA: Diagnosis not present

## 2022-08-27 DIAGNOSIS — J9311 Primary spontaneous pneumothorax: Secondary | ICD-10-CM | POA: Diagnosis not present

## 2022-08-27 DIAGNOSIS — I480 Paroxysmal atrial fibrillation: Secondary | ICD-10-CM

## 2022-08-27 HISTORY — PX: INTERCOSTAL NERVE BLOCK: SHX5021

## 2022-08-27 LAB — BLOOD GAS, ARTERIAL
Acid-Base Excess: 2.5 mmol/L — ABNORMAL HIGH (ref 0.0–2.0)
Bicarbonate: 26.3 mmol/L (ref 20.0–28.0)
Drawn by: 345601
O2 Saturation: 90.8 %
Patient temperature: 37
pCO2 arterial: 37 mmHg (ref 32–48)
pH, Arterial: 7.46 — ABNORMAL HIGH (ref 7.35–7.45)
pO2, Arterial: 58 mmHg — ABNORMAL LOW (ref 83–108)

## 2022-08-27 LAB — CBC
HCT: 39.1 % (ref 39.0–52.0)
Hemoglobin: 13.1 g/dL (ref 13.0–17.0)
MCH: 31.7 pg (ref 26.0–34.0)
MCHC: 33.5 g/dL (ref 30.0–36.0)
MCV: 94.7 fL (ref 80.0–100.0)
Platelets: 252 10*3/uL (ref 150–400)
RBC: 4.13 MIL/uL — ABNORMAL LOW (ref 4.22–5.81)
RDW: 12.6 % (ref 11.5–15.5)
WBC: 10.5 10*3/uL (ref 4.0–10.5)
nRBC: 0 % (ref 0.0–0.2)

## 2022-08-27 LAB — GLUCOSE, CAPILLARY
Glucose-Capillary: 110 mg/dL — ABNORMAL HIGH (ref 70–99)
Glucose-Capillary: 236 mg/dL — ABNORMAL HIGH (ref 70–99)
Glucose-Capillary: 125 mg/dL — ABNORMAL HIGH (ref 70–99)
Glucose-Capillary: 131 mg/dL — ABNORMAL HIGH (ref 70–99)
Glucose-Capillary: 143 mg/dL — ABNORMAL HIGH (ref 70–99)
Glucose-Capillary: 162 mg/dL — ABNORMAL HIGH (ref 70–99)

## 2022-08-27 LAB — SURGICAL PCR SCREEN
MRSA, PCR: NEGATIVE
Staphylococcus aureus: NEGATIVE

## 2022-08-27 LAB — HEPARIN LEVEL (UNFRACTIONATED): Heparin Unfractionated: 0.16 IU/mL — ABNORMAL LOW (ref 0.30–0.70)

## 2022-08-27 SURGERY — WEDGE RESECTION, LUNG, ROBOT-ASSISTED, THORACOSCOPIC
Anesthesia: General | Site: Chest | Laterality: Left

## 2022-08-27 MED ORDER — ACETAMINOPHEN 325 MG PO TABS: 325.0000 mg | ORAL_TABLET | Freq: Once | ORAL | Status: DC | PRN

## 2022-08-27 MED ORDER — ONDANSETRON HCL 4 MG/2ML IJ SOLN
INTRAMUSCULAR | Status: DC | PRN
  Administered 2022-08-27: 4 mg via INTRAVENOUS

## 2022-08-27 MED ORDER — ACETAMINOPHEN 160 MG/5ML PO SOLN: 1000.0000 mg | Freq: Four times a day (QID) | ORAL | Status: DC

## 2022-08-27 MED ORDER — LACTATED RINGERS IV SOLN: INTRAVENOUS | Status: DC | PRN

## 2022-08-27 MED ORDER — BISACODYL 5 MG PO TBEC
10.0000 mg | DELAYED_RELEASE_TABLET | Freq: Every day | ORAL | Status: DC
  Administered 2022-08-27 – 2022-08-31 (×4): 10 mg via ORAL
  Filled 2022-08-27 (×5): qty 2

## 2022-08-27 MED ORDER — HYDROMORPHONE HCL 1 MG/ML IJ SOLN
0.2500 mg | INTRAMUSCULAR | Status: DC | PRN
  Administered 2022-08-27: 0.5 mg via INTRAVENOUS

## 2022-08-27 MED ORDER — TRAMADOL HCL 50 MG PO TABS: 50.0000 mg | ORAL_TABLET | Freq: Four times a day (QID) | ORAL | Status: DC | PRN

## 2022-08-27 MED ORDER — SODIUM CHLORIDE FLUSH 0.9 % IV SOLN
INTRAVENOUS | Status: DC | PRN
  Administered 2022-08-27: 100 mL

## 2022-08-27 MED ORDER — LIDOCAINE 2% (20 MG/ML) 5 ML SYRINGE
INTRAMUSCULAR | Status: DC | PRN
  Administered 2022-08-27: 40 mg via INTRAVENOUS

## 2022-08-27 MED ORDER — ACETAMINOPHEN 160 MG/5ML PO SOLN: 325.0000 mg | Freq: Once | ORAL | Status: DC | PRN

## 2022-08-27 MED ORDER — CEFAZOLIN SODIUM-DEXTROSE 2-4 GM/100ML-% IV SOLN
2.0000 g | Freq: Three times a day (TID) | INTRAVENOUS | Status: AC
  Administered 2022-08-27 – 2022-08-28 (×2): 2 g via INTRAVENOUS
  Filled 2022-08-27 (×2): qty 100

## 2022-08-27 MED ORDER — SENNOSIDES-DOCUSATE SODIUM 8.6-50 MG PO TABS
1.0000 | ORAL_TABLET | Freq: Every day | ORAL | Status: DC
  Administered 2022-08-27: 1 via ORAL
  Filled 2022-08-27: qty 1

## 2022-08-27 MED ORDER — DEXAMETHASONE SODIUM PHOSPHATE 10 MG/ML IJ SOLN
INTRAMUSCULAR | Status: AC
  Filled 2022-08-27: qty 2

## 2022-08-27 MED ORDER — FENTANYL CITRATE (PF) 100 MCG/2ML IJ SOLN
INTRAMUSCULAR | Status: AC
  Filled 2022-08-27: qty 2

## 2022-08-27 MED ORDER — PROPOFOL 10 MG/ML IV BOLUS
INTRAVENOUS | Status: DC | PRN
  Administered 2022-08-27: 110 mg via INTRAVENOUS

## 2022-08-27 MED ORDER — PHENYLEPHRINE HCL-NACL 20-0.9 MG/250ML-% IV SOLN
INTRAVENOUS | Status: DC | PRN
  Administered 2022-08-27: 20 ug/min via INTRAVENOUS

## 2022-08-27 MED ORDER — PROPOFOL 500 MG/50ML IV EMUL
INTRAVENOUS | Status: DC | PRN
  Administered 2022-08-27: 50 ug/kg/min via INTRAVENOUS

## 2022-08-27 MED ORDER — SUGAMMADEX SODIUM 200 MG/2ML IV SOLN
INTRAVENOUS | Status: DC | PRN
  Administered 2022-08-27: 294 mg via INTRAVENOUS

## 2022-08-27 MED ORDER — ACETAMINOPHEN 10 MG/ML IV SOLN
1000.0000 mg | Freq: Once | INTRAVENOUS | Status: DC | PRN
  Administered 2022-08-27: 1000 mg via INTRAVENOUS

## 2022-08-27 MED ORDER — ROCURONIUM BROMIDE 10 MG/ML (PF) SYRINGE
PREFILLED_SYRINGE | INTRAVENOUS | Status: DC | PRN
  Administered 2022-08-27: 40 mg via INTRAVENOUS
  Administered 2022-08-27: 60 mg via INTRAVENOUS

## 2022-08-27 MED ORDER — BUPIVACAINE LIPOSOME 1.3 % IJ SUSP
INTRAMUSCULAR | Status: AC
  Filled 2022-08-27: qty 20

## 2022-08-27 MED ORDER — AMISULPRIDE (ANTIEMETIC) 5 MG/2ML IV SOLN: 10.0000 mg | Freq: Once | INTRAVENOUS | Status: DC | PRN

## 2022-08-27 MED ORDER — PHENYLEPHRINE 80 MCG/ML (10ML) SYRINGE FOR IV PUSH (FOR BLOOD PRESSURE SUPPORT)
PREFILLED_SYRINGE | INTRAVENOUS | Status: DC | PRN
  Administered 2022-08-27: 160 ug via INTRAVENOUS

## 2022-08-27 MED ORDER — ACETAMINOPHEN 10 MG/ML IV SOLN
INTRAVENOUS | Status: AC
  Filled 2022-08-27: qty 100

## 2022-08-27 MED ORDER — MEPERIDINE HCL 25 MG/ML IJ SOLN: 6.2500 mg | INTRAMUSCULAR | Status: DC | PRN

## 2022-08-27 MED ORDER — CHLORHEXIDINE GLUCONATE 0.12 % MT SOLN
OROMUCOSAL | Status: AC
  Filled 2022-08-27: qty 15

## 2022-08-27 MED ORDER — KETOROLAC TROMETHAMINE 15 MG/ML IJ SOLN
15.0000 mg | Freq: Four times a day (QID) | INTRAMUSCULAR | Status: DC
  Administered 2022-08-27 – 2022-08-28 (×4): 15 mg via INTRAVENOUS
  Filled 2022-08-27 (×4): qty 1

## 2022-08-27 MED ORDER — ENOXAPARIN SODIUM 40 MG/0.4ML IJ SOSY
40.0000 mg | PREFILLED_SYRINGE | Freq: Every day | INTRAMUSCULAR | Status: DC
  Administered 2022-08-28 – 2022-08-31 (×4): 40 mg via SUBCUTANEOUS
  Filled 2022-08-27 (×4): qty 0.4

## 2022-08-27 MED ORDER — OXYCODONE HCL 5 MG PO TABS: 5.0000 mg | ORAL_TABLET | ORAL | Status: DC | PRN

## 2022-08-27 MED ORDER — LACTATED RINGERS IV SOLN: INTRAVENOUS | Status: DC

## 2022-08-27 MED ORDER — 0.9 % SODIUM CHLORIDE (POUR BTL) OPTIME
TOPICAL | Status: DC | PRN
  Administered 2022-08-27: 2000 mL

## 2022-08-27 MED ORDER — ONDANSETRON HCL 4 MG/2ML IJ SOLN
INTRAMUSCULAR | Status: AC
  Filled 2022-08-27: qty 4

## 2022-08-27 MED ORDER — LUNG SURGERY BOOK
Freq: Once | Status: AC
  Filled 2022-08-27: qty 1

## 2022-08-27 MED ORDER — DEXAMETHASONE SODIUM PHOSPHATE 10 MG/ML IJ SOLN
INTRAMUSCULAR | Status: DC | PRN
  Administered 2022-08-27: 10 mg via INTRAVENOUS

## 2022-08-27 MED ORDER — FENTANYL CITRATE (PF) 250 MCG/5ML IJ SOLN
INTRAMUSCULAR | Status: AC
  Filled 2022-08-27: qty 5

## 2022-08-27 MED ORDER — MORPHINE SULFATE (PF) 2 MG/ML IV SOLN: 2.0000 mg | INTRAVENOUS | Status: DC | PRN

## 2022-08-27 MED ORDER — KETOROLAC TROMETHAMINE 30 MG/ML IJ SOLN
INTRAMUSCULAR | Status: AC
  Filled 2022-08-27: qty 1

## 2022-08-27 MED ORDER — ONDANSETRON HCL 4 MG/2ML IJ SOLN: 4.0000 mg | Freq: Four times a day (QID) | INTRAMUSCULAR | Status: DC | PRN

## 2022-08-27 MED ORDER — BUPIVACAINE HCL (PF) 0.5 % IJ SOLN
INTRAMUSCULAR | Status: AC
  Filled 2022-08-27: qty 30

## 2022-08-27 MED ORDER — KETOROLAC TROMETHAMINE 15 MG/ML IJ SOLN
INTRAMUSCULAR | Status: AC
  Filled 2022-08-27: qty 1

## 2022-08-27 MED ORDER — PANTOPRAZOLE SODIUM 40 MG PO TBEC: 40.0000 mg | DELAYED_RELEASE_TABLET | Freq: Every day | ORAL | Status: DC

## 2022-08-27 MED ORDER — FENTANYL CITRATE (PF) 250 MCG/5ML IJ SOLN
INTRAMUSCULAR | Status: DC | PRN
  Administered 2022-08-27: 50 ug via INTRAVENOUS

## 2022-08-27 MED ORDER — ACETAMINOPHEN 500 MG PO TABS
1000.0000 mg | ORAL_TABLET | Freq: Four times a day (QID) | ORAL | Status: DC
  Administered 2022-08-27 – 2022-08-30 (×11): 1000 mg via ORAL
  Filled 2022-08-27 (×11): qty 2

## 2022-08-27 MED ORDER — PROMETHAZINE HCL 25 MG/ML IJ SOLN: 6.2500 mg | INTRAMUSCULAR | Status: DC | PRN

## 2022-08-27 MED ORDER — HYDROMORPHONE HCL 1 MG/ML IJ SOLN
INTRAMUSCULAR | Status: AC
  Filled 2022-08-27: qty 1

## 2022-08-27 MED ORDER — ROCURONIUM BROMIDE 10 MG/ML (PF) SYRINGE
PREFILLED_SYRINGE | INTRAVENOUS | Status: AC
  Filled 2022-08-27: qty 20

## 2022-08-27 MED ORDER — LIDOCAINE 2% (20 MG/ML) 5 ML SYRINGE
INTRAMUSCULAR | Status: AC
  Filled 2022-08-27: qty 10

## 2022-08-27 SURGICAL SUPPLY — 87 items
ADH SKN CLS APL DERMABOND .7 (GAUZE/BANDAGES/DRESSINGS) ×2
APL PRP STRL LF DISP 70% ISPRP (MISCELLANEOUS) ×2
BAG SPEC RTRVL C125 8X14 (MISCELLANEOUS) ×2
BLADE CLIPPER SURG (BLADE) ×2 IMPLANT
CANISTER SUCT 3000ML PPV (MISCELLANEOUS) ×4 IMPLANT
CANNULA REDUCER 12-8 DVNC XI (CANNULA) ×4 IMPLANT
CATH THORACIC 28FR (CATHETERS) ×2 IMPLANT
CAUTERY SPATULA MNPLR 1.7 DVNC (INSTRUMENTS) IMPLANT
CHLORAPREP W/TINT 26 (MISCELLANEOUS) ×2 IMPLANT
CLIP TI MEDIUM 6 (CLIP) IMPLANT
CNTNR URN SCR LID CUP LEK RST (MISCELLANEOUS) ×10 IMPLANT
CONN ST 1/4X3/8 BEN (MISCELLANEOUS) IMPLANT
CONT SPEC 4OZ STRL OR WHT (MISCELLANEOUS) ×10
DEFOGGER SCOPE WARMER CLEARIFY (MISCELLANEOUS) ×2 IMPLANT
DERMABOND ADVANCED .7 DNX12 (GAUZE/BANDAGES/DRESSINGS) ×2 IMPLANT
DRAIN CHANNEL 28F RND 3/8 FF (WOUND CARE) IMPLANT
DRAIN CHANNEL 32F RND 10.7 FF (WOUND CARE) IMPLANT
DRAPE ARM DVNC X/XI (DISPOSABLE) ×8 IMPLANT
DRAPE COLUMN DVNC XI (DISPOSABLE) ×2 IMPLANT
DRAPE CV SPLIT W-CLR ANES SCRN (DRAPES) ×2 IMPLANT
DRAPE HALF SHEET 40X57 (DRAPES) ×2 IMPLANT
DRAPE ORTHO SPLIT 77X108 STRL (DRAPES) ×2
DRAPE SURG ORHT 6 SPLT 77X108 (DRAPES) ×2 IMPLANT
ELECT BLADE 6.5 EXT (BLADE) IMPLANT
ELECT REM PT RETURN 9FT ADLT (ELECTROSURGICAL) ×2
ELECTRODE REM PT RTRN 9FT ADLT (ELECTROSURGICAL) ×2 IMPLANT
FORCEPS BPLR LNG DVNC XI (INSTRUMENTS) IMPLANT
FORCEPS CADIERE DVNC XI (FORCEP) IMPLANT
GAUZE KITTNER 4X5 RF (MISCELLANEOUS) ×2 IMPLANT
GAUZE SPONGE 4X4 12PLY STRL (GAUZE/BANDAGES/DRESSINGS) ×2 IMPLANT
GAUZE SPONGE 4X4 12PLY STRL LF (GAUZE/BANDAGES/DRESSINGS) IMPLANT
GLOVE BIO SURGEON STRL SZ7.5 (GLOVE) ×4 IMPLANT
GLOVE SURG SS PI 8.0 STRL IVOR (GLOVE) ×2 IMPLANT
GOWN STRL REUS W/ TWL LRG LVL3 (GOWN DISPOSABLE) ×4 IMPLANT
GOWN STRL REUS W/ TWL XL LVL3 (GOWN DISPOSABLE) ×4 IMPLANT
GOWN STRL REUS W/TWL 2XL LVL3 (GOWN DISPOSABLE) ×2 IMPLANT
GOWN STRL REUS W/TWL LRG LVL3 (GOWN DISPOSABLE) ×4
GOWN STRL REUS W/TWL XL LVL3 (GOWN DISPOSABLE) ×4
GRASPER TIP-UP FEN DVNC XI (INSTRUMENTS) IMPLANT
HEMOSTAT SURGICEL 2X14 (HEMOSTASIS) ×6 IMPLANT
KIT BASIN OR (CUSTOM PROCEDURE TRAY) ×2 IMPLANT
KIT TURNOVER KIT B (KITS) ×2 IMPLANT
NDL 22X1.5 STRL (OR ONLY) (MISCELLANEOUS) ×2 IMPLANT
NEEDLE 22X1.5 STRL (OR ONLY) (MISCELLANEOUS) ×2 IMPLANT
NS IRRIG 1000ML POUR BTL (IV SOLUTION) ×6 IMPLANT
PACK CHEST (CUSTOM PROCEDURE TRAY) ×2 IMPLANT
PAD ARMBOARD 7.5X6 YLW CONV (MISCELLANEOUS) ×10 IMPLANT
PORT ACCESS TROCAR AIRSEAL 12 (TROCAR) ×2 IMPLANT
RELOAD STAPLE 45 3.5 BLU DVNC (STAPLE) IMPLANT
RELOAD STAPLE 45 4.3 GRN DVNC (STAPLE) IMPLANT
RELOAD STAPLE 45 4.6 BLK DVNC (STAPLE) IMPLANT
RELOAD STAPLER 3.5X45 BLU DVNC (STAPLE) ×10 IMPLANT
RELOAD STAPLER 4.3X45 GRN DVNC (STAPLE) ×6 IMPLANT
RELOAD STAPLER 45 4.6 BLK DVNC (STAPLE) ×4 IMPLANT
SEAL UNIV 5-12 XI (MISCELLANEOUS) ×8 IMPLANT
SET TRI-LUMEN FLTR TB AIRSEAL (TUBING) ×2 IMPLANT
SOL ELECTROSURG ANTI STICK (MISCELLANEOUS) ×2
SOLUTION ELECTROSURG ANTI STCK (MISCELLANEOUS) ×2 IMPLANT
STAPLER 45 SUREFORM DVNC (STAPLE) IMPLANT
STAPLER RELOAD 3.5X45 BLU DVNC (STAPLE) ×10
STAPLER RELOAD 4.3X45 GRN DVNC (STAPLE) ×6
STAPLER RELOAD 45 4.6 BLK DVNC (STAPLE) ×4
STOPCOCK 4 WAY LG BORE MALE ST (IV SETS) ×2 IMPLANT
SUT PDS AB 1 CTX 36 (SUTURE) IMPLANT
SUT PROLENE 4 0 RB 1 (SUTURE)
SUT PROLENE 4-0 RB1 .5 CRCL 36 (SUTURE) IMPLANT
SUT SILK 1 MH (SUTURE) ×2 IMPLANT
SUT SILK 2 0 SH (SUTURE) IMPLANT
SUT SILK 2 0SH CR/8 30 (SUTURE) IMPLANT
SUT VIC AB 1 CTX 36 (SUTURE)
SUT VIC AB 1 CTX36XBRD ANBCTR (SUTURE) IMPLANT
SUT VIC AB 2-0 CT1 27 (SUTURE) ×2
SUT VIC AB 2-0 CT1 TAPERPNT 27 (SUTURE) ×2 IMPLANT
SUT VIC AB 3-0 SH 27 (SUTURE) ×4
SUT VIC AB 3-0 SH 27X BRD (SUTURE) ×6 IMPLANT
SUT VICRYL 0 TIES 12 18 (SUTURE) ×2 IMPLANT
SUT VICRYL 0 UR6 27IN ABS (SUTURE) ×4 IMPLANT
SYR 10ML LL (SYRINGE) ×2 IMPLANT
SYR 20ML LL LF (SYRINGE) ×2 IMPLANT
SYR 50ML LL SCALE MARK (SYRINGE) ×2 IMPLANT
SYSTEM RETRIEVAL ANCHOR 8 (MISCELLANEOUS) IMPLANT
SYSTEM SAHARA CHEST DRAIN ATS (WOUND CARE) ×2 IMPLANT
TAPE CLOTH 4X10 WHT NS (GAUZE/BANDAGES/DRESSINGS) ×2 IMPLANT
TOWEL GREEN STERILE (TOWEL DISPOSABLE) ×2 IMPLANT
TRAY FOLEY MTR SLVR 16FR STAT (SET/KITS/TRAYS/PACK) ×2 IMPLANT
TUBING EXTENTION W/L.L. (IV SETS) ×2 IMPLANT
WATER STERILE IRR 1000ML POUR (IV SOLUTION) ×2 IMPLANT

## 2022-08-27 NOTE — Anesthesia Postprocedure Evaluation (Signed)
Anesthesia Post Note  Patient: Jesus Bush  Procedure(s) Performed: XI ROBOTIC ASSISTED THORACOSCOPY LEFT WEDGE RESECTION WITH PLEURECTOMY, MECHANICAL PLEURADESIS (Left: Chest) INTERCOSTAL NERVE BLOCK (Chest)     Patient location during evaluation: PACU Anesthesia Type: General Level of consciousness: awake and alert Pain management: pain level controlled Vital Signs Assessment: post-procedure vital signs reviewed and stable Respiratory status: spontaneous breathing, nonlabored ventilation, respiratory function stable and patient connected to nasal cannula oxygen Cardiovascular status: blood pressure returned to baseline and stable Postop Assessment: no apparent nausea or vomiting Anesthetic complications: no  No notable events documented.  Last Vitals:  Vitals:   08/27/22 1500 08/27/22 1516  BP: 127/60 104/69  Pulse: 78 93  Resp: 15 20  Temp:  36.8 C  SpO2: 94% 95%    Last Pain:  Vitals:   08/27/22 1516  TempSrc: Oral  PainSc: 7                  Shelton Silvas

## 2022-08-27 NOTE — Anesthesia Preprocedure Evaluation (Addendum)
Anesthesia Evaluation  Patient identified by MRN, date of birth, ID band Patient awake    Reviewed: Allergy & Precautions, NPO status , Patient's Chart, lab work & pertinent test results  Airway Mallampati: I  TM Distance: >3 FB Neck ROM: Full    Dental  (+) Edentulous Upper, Edentulous Lower   Pulmonary COPD,  COPD inhaler, Patient abstained from smoking., former smoker    + decreased breath sounds      Cardiovascular hypertension, Pt. on medications + CAD   Rhythm:Regular Rate:Normal  Echo:  1. Left ventricular ejection fraction, by estimation, is 60 to 65%. The  left ventricle has normal function. The left ventricle has no regional  wall motion abnormalities. There is mild left ventricular hypertrophy.  Left ventricular diastolic parameters  are indeterminate.   2. Right ventricular systolic function is normal. The right ventricular  size is normal. There is normal pulmonary artery systolic pressure. The  estimated right ventricular systolic pressure is 28.8 mmHg.   3. The mitral valve is normal in structure. No evidence of mitral valve  regurgitation.   4. The aortic valve is tricuspid. Aortic valve regurgitation is not  visualized. Aortic valve sclerosis/calcification is present, without any  evidence of aortic stenosis.   5. The inferior vena cava is normal in size with greater than 50%  respiratory variability, suggesting right atrial pressure of 3 mmHg.     Neuro/Psych negative neurological ROS  negative psych ROS   GI/Hepatic Neg liver ROS,GERD  Medicated,,  Endo/Other  diabetes Hyperthyroidism   Renal/GU negative Renal ROS  negative genitourinary   Musculoskeletal negative musculoskeletal ROS (+)    Abdominal   Peds  Hematology negative hematology ROS (+)   Anesthesia Other Findings   Reproductive/Obstetrics                             Anesthesia Physical Anesthesia  Plan  ASA: 3  Anesthesia Plan: General   Post-op Pain Management: Tylenol PO (pre-op)* and Toradol IV (intra-op)*   Induction: Intravenous  PONV Risk Score and Plan: 3 and Ondansetron, Dexamethasone and Midazolam  Airway Management Planned: Double Lumen EBT  Additional Equipment: ClearSight  Intra-op Plan:   Post-operative Plan: Extubation in OR  Informed Consent: I have reviewed the patients History and Physical, chart, labs and discussed the procedure including the risks, benefits and alternatives for the proposed anesthesia with the patient or authorized representative who has indicated his/her understanding and acceptance.       Plan Discussed with: CRNA  Anesthesia Plan Comments:        Anesthesia Quick Evaluation

## 2022-08-27 NOTE — Op Note (Signed)
      [address]       [place] [address]             [phone]        08/27/2022  Patient:  Jesus Bush Pre-Op Dx: Left spontaneous pneumothorax   Post-op Dx:  same Procedure:  - Robotic assisted left video thoracoscopy - Wedge resection of the left upper lobe - Apical pleurectomy - Mechanical pleurodesis - Intercostal nerve block  Surgeon and Role:      * Sydnei Ohaver, Eliezer Lofts, MD - Primary     Assistant: Jaclyn Prime, PA-C  Anesthesia  general EBL:  50ml Blood Administration: none Specimen:  left upper lobe wedge X 2.  Apical pleura  Drains: 20 F argyle chest tube in left chest Counts: correct   Indications: 77yo male with left pneumoT with bullous emphysema.  We discussed the risks and benefits of wedge resection, and pleurodesis.  Despite this being his 1st episode, I think that with the bullous disease, he will reduce his risk of recurrence with resection.   Findings: Fibrotic changes to upper lobe apex.  Blebs at apex and lingula  Operative Technique: After the risks, benefits and alternatives were thoroughly discussed, the patient was brought to the operative theatre.  Anesthesia was induced, and the patient was then placed in a right lateral decubitus position and was prepped and draped in normal sterile fashion.  An appropriate surgical pause was performed, and pre-operative antibiotics were dosed accordingly.  We began by placing our 4 robotic ports in the 7th intercostal space targeting the hilum of the lung.  A 12mm assistant port was placed in the 9th intercostal space in the anterior axillary line.  The robot was then docked and all instruments were passed under direct visualization.    The lung was then retracted superiorly, and the inferior pulmonary ligament was divided.  The lung was freed of all pleural adhesions.  Wedge resections of the upper were performed.  The apical parietal pleural was removed with a combination of cautery and  blunt dissection.  The chest was irrigated, and an air leak test was performed.  An intercostal nerve block was performed under direct visualization.  A mechanical pleurodesis was then performed.  A 76F chest with then placed, and we watch the remaining lobes re-expand.  The skin and soft tissue were closed with absorbable suture.    The patient tolerated the procedure without any immediate complications, and was transferred to the PACU in stable condition.  Darlina Mccaughey Keane Scrape

## 2022-08-27 NOTE — Progress Notes (Signed)
PROGRESS NOTE    Jesus Bush  ZOX:[id number] DOB: 04/25/1945 DOA: 08/22/2022 PCP: [name], MD    Chief Complaint  Patient presents with   Abdominal Pain    Brief Narrative:   Jesus Bush  is a 77 y.o. male, with past medical history of active tobacco abuse, COPD, hypertension, hyperlipidemia, aortic atherosclerosis, diabetes mellitus, patient presents to ED secondary to complaints of bloating, epigastric pain, reports pain has started earlier this morning around 4 AM, has been intermittent, resolved with breakfast, but happened again late a.m., was radiating to his abdomen and lower chest, and he does report some dyspnea, denies fever, chills.  He denies any fall, no trauma. -CT chest/abdomen, and CTA chest, which did show large left-sided pneumothorax, so chest tube has been inserted by ED physician, PCCM were consulted, as well CT surgery's been consulted for VATS pleurodesis, hospital stay has been complicated by patient developing new onset a flutter for which he has been started on heparin gtt. and Cardizem gtt. per cardiology.  Assessment & Plan:   Principal Problem:   Pneumothorax Active Problems:   Abnormal glucose (prediabetes)   Essential hypertension   Mixed hyperlipidemia   Esophageal reflux   COPD (chronic obstructive pulmonary disease) (HCC)   Aortic atherosclerosis (HCC)   Hyperthyroidism   Tobacco dependence   Spontaneous left-sided pneumothorax -Most likely in the setting of history of smoking, and COPD. -Chest tube management per PCCM. -Continue with as needed pain medications, pain is controlled -CT surgery consulted for  VATS pleurodesis, plan for surgery today, he is n.p.o., heparin GTT has been held at 4 AM.  COPD -Continue with Breo and Incruse -No active wheezing, continue with as needed nebs   Tobacco abuse -He was counseled, will keep on nicotine patch   History of CAD - Resume statin, will resume aspirin when more stable  New  onset a flutter -Developed a flutter 08/25/2022, cardiology input greatly appreciated he is started on Cardizem drip, heparin drip, and will need Eliquis postoperatively once cleared by CT surgery -TSH within normal limit. -2D echo with a preserved EF -Drip on hold this morning in anticipation for surgery.   GERD -Daughter-in-law reports significant reflux, he is scheduled to follow with GI as an outpatient  for next month, he was treated with increased dose of IV Protonix, and short course of Carafate, he will need to be discharged on increased dose of Protonix till seen by GI as an outpatient.    Hyperlipidemia -Continue with statin   Hypertension -Losartan has been discontinued, blood pressure is controlled on Cardizem gtt.    Diabetes mellitus - Controlled with diet,  A1c is 6.6 , and will monitor CBGs.  Constipation -Continue with bowel regimen     DVT prophylaxis: Heparin Code Status: Full Family Communication: None at bedside Disposition:   Status is: Inpatient    Consultants:  PCCM CT surgery Cardiology   Subjective:  Patient reports constipation has resolved, good BM yesterday, denies fever, chills, chest pain or shortness of breath Objective: Vitals:   08/27/22 0200 08/27/22 0300 08/27/22 0400 08/27/22 0500  BP: 111/66 117/60 116/61 132/62  Pulse: 71 73 76 74  Resp: 15 14 19 17   Temp:  98 F (36.7 C)    TempSrc:  Oral    SpO2: 93% 94% 92% 93%  Weight:      Height:        Intake/Output Summary (Last 24 hours) at 08/27/2022 0645 Last data filed at 08/27/2022 0500 Gross per  24 hour  Intake 240 ml  Output 250 ml  Net -10 ml   Filed Weights   08/24/22 0916  Weight: 73.5 kg    Examination:  Awake Alert, Oriented X 3, No new F.N deficits, Normal affect Symmetrical Chest wall movement, Good air movement bilaterally, left-sided chest tube present RRR,No Gallops,Rubs or new Murmurs, No Parasternal Heave +ve B.Sounds, Abd Soft, No tenderness, No rebound  - guarding or rigidity. No Cyanosis, Clubbing or edema, No new Rash or bruise        Data Reviewed: I have personally reviewed following labs and imaging studies  CBC: Recent Labs  Lab 08/22/22 1248 08/23/22 0423 08/25/22 1530 08/26/22 0324 08/27/22 0537  WBC 10.5 8.7 16.5* 9.1 10.5  NEUTROABS 7.6  --   --   --   --   HGB 14.8 13.8 14.5 13.7 13.1  HCT 44.8 41.3 43.3 41.5 39.1  MCV 94.7 96.7 95.4 95.8 94.7  PLT 252 248 272 224 252    Basic Metabolic Panel: Recent Labs  Lab 08/22/22 1248 08/23/22 0423 08/25/22 1530 08/26/22 0324  NA 139 138 135 135  K 4.0 4.2 4.1 4.0  CL 102 104 98 99  CO2 26 25 25 24   GLUCOSE 147* 147* 142* 144*  BUN 11 12 12 13   CREATININE 1.31* 1.06 1.01 0.92  CALCIUM 9.7 9.2 9.9 8.9  MG  --   --  2.3  --     GFR: Estimated Creatinine Clearance: 69.4 mL/min (by C-G formula based on SCr of 0.92 mg/dL).  Liver Function Tests: Recent Labs  Lab 08/22/22 1248  AST 43*  ALT 42  ALKPHOS 81  BILITOT 0.5  PROT 6.9  ALBUMIN 3.8    CBG: Recent Labs  Lab 08/25/22 2026 08/26/22 0742 08/26/22 1143 08/26/22 1552 08/26/22 2118  GLUCAP 132* 124* 132* 142* 120*     Recent Results (from the past 240 hour(s))  SARS Coronavirus 2 by RT PCR (hospital order, performed in [facility] hospital lab) *cepheid single result test* Anterior Nasal Swab     Status: None   Collection Time: 08/26/22  6:32 PM   Specimen: Anterior Nasal Swab  Result Value Ref Range Status   SARS Coronavirus 2 by RT PCR NEGATIVE NEGATIVE Final    Comment: Performed at [facility], [address], [place], [place] [address]         Radiology Studies: DG Chest 2 View  Result Date: 08/26/2022 CLINICAL DATA:  Pneumothorax, chest tube in place EXAM: CHEST - 2 VIEW COMPARISON:  08/25/2022 FINDINGS: Frontal and lateral views of the chest demonstrate stable pigtail drainage catheter within the left anterior lower hemithorax. Cardiac silhouette is unchanged. Persistent  bibasilar hypoventilatory changes. There may be trace bilateral pleural effusions, left greater than right, not appreciably changed. No evidence of pneumothorax. No acute bony abnormality. IMPRESSION: 1. Stable indwelling left chest tube. No evidence of residual or recurrent pneumothorax. 2. Bibasilar hypoventilatory changes and trace bilateral pleural effusions, not appreciably changed since prior exam. Electronically Signed   By: Sharlet Salina M.D.   On: 08/26/2022 19:38   ECHOCARDIOGRAM COMPLETE  Result Date: 08/26/2022    ECHOCARDIOGRAM REPORT   Patient Name:   IVEY BUNGERT Date of Exam: 08/26/2022 Medical Rec #:  [id number]       Height:       70.0 in Accession #:    [id number]      Weight:       162.0 lb Date of  Birth:  Nov 18, 1945      BSA:          1.909 m Patient Age:    76 years        BP:           126/66 mmHg Patient Gender: M               HR:           73 bpm. Exam Location:  Inpatient Procedure: 2D Echo, Color Doppler and Cardiac Doppler Indications:    atrial flutter  History:        Patient has no prior history of Echocardiogram examinations.                 COPD, Arrythmias:bigeminy and PVC; Risk Factors:Hypertension,                 Dyslipidemia and Current Smoker.  Sonographer:    [name] RDCS Referring Phys: [id number] Leodan Bolyard S Gwendelyn Lanting IMPRESSIONS  1. Left ventricular ejection fraction, by estimation, is 60 to 65%. The left ventricle has normal function. The left ventricle has no regional wall motion abnormalities. There is mild left ventricular hypertrophy. Left ventricular diastolic parameters are indeterminate.  2. Right ventricular systolic function is normal. The right ventricular size is normal. There is normal pulmonary artery systolic pressure. The estimated right ventricular systolic pressure is 28.8 mmHg.  3. The mitral valve is normal in structure. No evidence of mitral valve regurgitation.  4. The aortic valve is tricuspid. Aortic valve regurgitation is not visualized.  Aortic valve sclerosis/calcification is present, without any evidence of aortic stenosis.  5. The inferior vena cava is normal in size with greater than 50% respiratory variability, suggesting right atrial pressure of 3 mmHg. FINDINGS  Left Ventricle: Left ventricular ejection fraction, by estimation, is 60 to 65%. The left ventricle has normal function. The left ventricle has no regional wall motion abnormalities. The left ventricular internal cavity size was normal in size. There is  mild left ventricular hypertrophy. Left ventricular diastolic parameters are indeterminate. Right Ventricle: The right ventricular size is normal. No increase in right ventricular wall thickness. Right ventricular systolic function is normal. There is normal pulmonary artery systolic pressure. The tricuspid regurgitant velocity is 2.54 m/s, and  with an assumed right atrial pressure of 3 mmHg, the estimated right ventricular systolic pressure is 28.8 mmHg. Left Atrium: Left atrial size was normal in size. Right Atrium: Right atrial size was normal in size. Pericardium: Trivial pericardial effusion is present. Mitral Valve: The mitral valve is normal in structure. No evidence of mitral valve regurgitation. Tricuspid Valve: The tricuspid valve is normal in structure. Tricuspid valve regurgitation is trivial. Aortic Valve: The aortic valve is tricuspid. Aortic valve regurgitation is not visualized. Aortic valve sclerosis/calcification is present, without any evidence of aortic stenosis. Pulmonic Valve: The pulmonic valve was not well visualized. Pulmonic valve regurgitation is not visualized. Aorta: The aortic root and ascending aorta are structurally normal, with no evidence of dilitation. Venous: The inferior vena cava is normal in size with greater than 50% respiratory variability, suggesting right atrial pressure of 3 mmHg. IAS/Shunts: The interatrial septum was not well visualized.  LEFT VENTRICLE PLAX 2D LVIDd:         4.50 cm    Diastology LVIDs:         3.40 cm   LV e' medial:    6.42 cm/s LV PW:         1.10 cm  LV E/e' medial:  8.6 LV IVS:        1.10 cm   LV e' lateral:   7.72 cm/s LVOT diam:     2.10 cm   LV E/e' lateral: 7.2 LV SV:         49 LV SV Index:   26 LVOT Area:     3.46 cm  RIGHT VENTRICLE             IVC RV Basal diam:  2.60 cm     IVC diam: 1.70 cm RV S prime:     15.40 cm/s TAPSE (M-mode): 1.9 cm LEFT ATRIUM           Index        RIGHT ATRIUM           Index LA diam:      4.00 cm 2.10 cm/m   RA Area:     13.30 cm LA Vol (A4C): 44.4 ml 23.26 ml/m  RA Volume:   28.50 ml  14.93 ml/m  AORTIC VALVE LVOT Vmax:   79.20 cm/s LVOT Vmean:  51.400 cm/s LVOT VTI:    0.142 m  AORTA Ao Root diam: 3.00 cm Ao Asc diam:  3.10 cm MITRAL VALVE               TRICUSPID VALVE MV Area (PHT): 3.12 cm    TR Peak grad:   25.8 mmHg MV Decel Time: 243 msec    TR Vmax:        254.00 cm/s MV E velocity: 55.50 cm/s MV A velocity: 46.00 cm/s  SHUNTS MV E/A ratio:  1.21        Systemic VTI:  0.14 m                            Systemic Diam: 2.10 cm Epifanio Lesches MD Electronically signed by Epifanio Lesches MD Signature Date/Time: 08/26/2022/12:35:35 PM    Final    DG CHEST PORT 1 VIEW  Result Date: 08/25/2022 CLINICAL DATA:  Follow-up left pneumothorax and left chest tube. EXAM: PORTABLE CHEST 1 VIEW COMPARISON:  08/24/2022 and older exams.  CT, 08/22/2022. FINDINGS: Stable left inferior hemithorax pigtail chest tube. No pneumothorax. Left greater than right lung base opacities, consistent with atelectasis, stable from the previous day's exam. Remainder of the lungs is clear. IMPRESSION: 1. No residual pneumothorax. 2. No other change from the previous day's exam. Stable left chest tube. Left greater than right lung base opacities suspected to be atelectasis. Electronically Signed   By: Amie Portland M.D.   On: 08/25/2022 09:49        Scheduled Meds:  docusate sodium  100 mg Oral BID   fluticasone furoate-vilanterol  1 puff  Inhalation Daily   And   umeclidinium bromide  1 puff Inhalation Daily   montelukast  10 mg Oral Daily   nicotine  21 mg Transdermal Daily   pantoprazole  40 mg Oral BID   polyethylene glycol  17 g Oral BID   rosuvastatin  40 mg Oral Daily   sodium chloride flush  10 mL Intrapleural Q8H   Continuous Infusions:   ceFAZolin (ANCEF) IV     diltiazem (CARDIZEM) infusion 10 mg/hr (08/27/22 0601)     LOS: 5 days      Huey Bienenstock, MD [facility]   To contact the attending provider between 7A-7P or the covering provider during after hours 7P-7A, please log into the  web site [email or web address] and access using universal Barber password for that web site. If you do not have the password, please call the hospital operator.  08/27/2022, 6:45 AM

## 2022-08-27 NOTE — Progress Notes (Signed)
     [address]       [place] [id number]             [phone]       No events  Vitals:   08/27/22 0957 08/27/22 1043  BP: 135/70 126/63  Pulse:    Resp: (!) 24   Temp: 98.4 F (36.9 C)   SpO2: 95%    Alert NAD Sinus EWOB, no leak  OR today for L RATS, wedge resection, apical pleurectomy, and mechanical pleurodesis.  Helmuth Recupero Keane Scrape

## 2022-08-27 NOTE — Brief Op Note (Signed)
08/22/2022 - 08/27/2022  1:51 PM  PATIENT:  Jesus Bush  77 y.o. male  PRE-OPERATIVE DIAGNOSIS:  pneumothorax  POST-OPERATIVE DIAGNOSIS:  pneumothorax  PROCEDURE:  Procedure(s):  XI ROBOTIC ASSISTED THORACOSCOPY -Left Upper Lobe Wedge Resection x 2 -Apical Pleurectomy -Mechanical Pleurodesis -Intercostal Nerve Block  INTERCOSTAL NERVE BLOCK  SURGEON:  Surgeon(s) and Role:    * Lightfoot, Eliezer Lofts, MD - Primary  PHYSICIAN ASSISTANT: Lowella Dandy PA-C  ASSISTANTS: none   ANESTHESIA:   general  EBL: Per Anesthesia Record  BLOOD ADMINISTERED:none  DRAINS:  28 Straight Chest Tube    LOCAL MEDICATIONS USED:  BUPIVICAINE   SPECIMEN:  Source of Specimen:  Left Upper Lobe Wedge Resection x 2, Pleura  DISPOSITION OF SPECIMEN:  PATHOLOGY  COUNTS:  YES  TOURNIQUET:  * No tourniquets in log *  DICTATION: .Dragon Dictation  PLAN OF CARE: Admit to inpatient   PATIENT DISPOSITION:  PACU - hemodynamically stable.   Delay start of Pharmacological VTE agent (>24hrs) due to surgical blood loss or risk of bleeding: yes

## 2022-08-27 NOTE — Transfer of Care (Signed)
Immediate Anesthesia Transfer of Care Note  Patient: Jesus Bush  Procedure(s) Performed: XI ROBOTIC ASSISTED THORACOSCOPY LEFT WEDGE RESECTION WITH PLEURECTOMY, MECHANICAL PLEURADESIS (Left: Chest) INTERCOSTAL NERVE BLOCK (Chest)  Patient Location: PACU  Anesthesia Type:General  Level of Consciousness: awake, alert , and oriented  Airway & Oxygen Therapy: Patient Spontanous Breathing and Patient connected to nasal cannula oxygen  Post-op Assessment: Report given to RN and Post -op Vital signs reviewed and stable  Post vital signs: Reviewed and stable  Last Vitals:  Vitals Value Taken Time  BP 115/68 08/27/22 1416  Temp 98   Pulse 90 08/27/22 1424  Resp 27 08/27/22 1427  SpO2 93 % 08/27/22 1424  Vitals shown include unvalidated device data.  Last Pain:  Vitals:   08/27/22 0957  TempSrc: Oral  PainSc:       Patients Stated Pain Goal: 0 (08/26/22 2123)  Complications: No notable events documented.

## 2022-08-27 NOTE — Anesthesia Procedure Notes (Signed)
Procedure Name: Intubation Date/Time: 08/27/2022 12:50 PM  Performed by: Darryl Nestle, CRNAPre-anesthesia Checklist: Patient identified, Emergency Drugs available, Suction available and Patient being monitored Patient Re-evaluated:Patient Re-evaluated prior to induction Oxygen Delivery Method: Circle system utilized Preoxygenation: Pre-oxygenation with 100% oxygen Induction Type: IV induction Ventilation: Mask ventilation without difficulty Laryngoscope Size: Mac and 3 Grade View: Grade I Tube type: Oral Endobronchial tube: Double lumen EBT, Left and EBT position confirmed by fiberoptic bronchoscope and 39 Fr Number of attempts: 1 Airway Equipment and Method: Oral airway and Stylet Placement Confirmation: ETT inserted through vocal cords under direct vision, positive ETCO2 and breath sounds checked- equal and bilateral Tube secured with: Tape Dental Injury: Teeth and Oropharynx as per pre-operative assessment

## 2022-08-28 ENCOUNTER — Inpatient Hospital Stay (HOSPITAL_COMMUNITY): Payer: Medicare HMO

## 2022-08-28 ENCOUNTER — Encounter (HOSPITAL_COMMUNITY): Payer: Self-pay | Admitting: Thoracic Surgery (Cardiothoracic Vascular Surgery)

## 2022-08-28 DIAGNOSIS — F17200 Nicotine dependence, unspecified, uncomplicated: Secondary | ICD-10-CM | POA: Diagnosis not present

## 2022-08-28 DIAGNOSIS — I480 Paroxysmal atrial fibrillation: Secondary | ICD-10-CM

## 2022-08-28 DIAGNOSIS — J9383 Other pneumothorax: Secondary | ICD-10-CM | POA: Diagnosis not present

## 2022-08-28 DIAGNOSIS — E782 Mixed hyperlipidemia: Secondary | ICD-10-CM | POA: Diagnosis not present

## 2022-08-28 DIAGNOSIS — J449 Chronic obstructive pulmonary disease, unspecified: Secondary | ICD-10-CM | POA: Diagnosis not present

## 2022-08-28 LAB — CBC
HCT: 37.2 % — ABNORMAL LOW (ref 39.0–52.0)
Hemoglobin: 12.5 g/dL — ABNORMAL LOW (ref 13.0–17.0)
MCH: 31.8 pg (ref 26.0–34.0)
MCHC: 33.6 g/dL (ref 30.0–36.0)
MCV: 94.7 fL (ref 80.0–100.0)
Platelets: 237 10*3/uL (ref 150–400)
RBC: 3.93 MIL/uL — ABNORMAL LOW (ref 4.22–5.81)
RDW: 12.5 % (ref 11.5–15.5)
WBC: 15.3 10*3/uL — ABNORMAL HIGH (ref 4.0–10.5)
nRBC: 0 % (ref 0.0–0.2)

## 2022-08-28 LAB — GLUCOSE, CAPILLARY
Glucose-Capillary: 136 mg/dL — ABNORMAL HIGH (ref 70–99)
Glucose-Capillary: 152 mg/dL — ABNORMAL HIGH (ref 70–99)
Glucose-Capillary: 119 mg/dL — ABNORMAL HIGH (ref 70–99)
Glucose-Capillary: 138 mg/dL — ABNORMAL HIGH (ref 70–99)

## 2022-08-28 LAB — BASIC METABOLIC PANEL
Anion gap: 8 (ref 5–15)
BUN: 14 mg/dL (ref 8–23)
CO2: 27 mmol/L (ref 22–32)
Calcium: 8.9 mg/dL (ref 8.9–10.3)
Chloride: 99 mmol/L (ref 98–111)
Creatinine, Ser: 1.12 mg/dL (ref 0.61–1.24)
GFR, Estimated: >60 mL/min (ref >60)
Glucose, Bld: 201 mg/dL — ABNORMAL HIGH (ref 70–99)
Potassium: 4.3 mmol/L (ref 3.5–5.1)
Sodium: 134 mmol/L — ABNORMAL LOW (ref 135–145)

## 2022-08-28 LAB — HEPARIN LEVEL (UNFRACTIONATED): Heparin Unfractionated: <0.1 IU/mL — ABNORMAL LOW (ref 0.30–0.70)

## 2022-08-28 MED ORDER — DILTIAZEM HCL ER COATED BEADS 120 MG PO CP24
120.0000 mg | ORAL_CAPSULE | Freq: Every day | ORAL | Status: DC
  Administered 2022-08-28: 120 mg via ORAL
  Filled 2022-08-28: qty 1

## 2022-08-28 NOTE — Progress Notes (Addendum)
      [address]       [place] [id number]             [phone]      1 Day Post-Op Procedure(s) (LRB): XI ROBOTIC ASSISTED THORACOSCOPY LEFT WEDGE RESECTION WITH PLEURECTOMY, MECHANICAL PLEURADESIS (Left) INTERCOSTAL NERVE BLOCK  Subjective:  Patient w/o pain.  Denies N/V.  Objective: Vital signs in last 24 hours: Temp:  [97.6 F (36.4 C)-98.5 F (36.9 C)] 97.6 F (36.4 C) (06/04 0736) Pulse Rate:  [63-93] 67 (06/04 0736) Cardiac Rhythm: Normal sinus rhythm (06/03 1940) Resp:  [13-25] 20 (06/04 0736) BP: (104-135)/(55-70) 116/55 (06/04 0736) SpO2:  [91 %-97 %] 93 % (06/04 0736) Weight:  [75.3 kg] 75.3 kg (06/03 1516)  Intake/Output from previous day: 06/03 0701 - 06/04 0700 In: 1550 [I.V.:1331.2; IV Piggyback:218.8] Out: 805 [Urine:625; Blood:50; Chest Tube:130]  General appearance: alert, cooperative, and no distress Heart: regular rate and rhythm Lungs: clear to auscultation bilaterally Abdomen: soft, non-tender; bowel sounds normal; no masses,  no organomegaly Extremities: extremities normal, atraumatic, no cyanosis or edema Wound: clean and dry  Lab Results: Recent Labs    08/27/22 0537 08/28/22 0031  WBC 10.5 15.3*  HGB 13.1 12.5*  HCT 39.1 37.2*  PLT 252 237   BMET:  Recent Labs    08/26/22 0324 08/28/22 0031  NA 135 134*  K 4.0 4.3  CL 99 99  CO2 24 27  GLUCOSE 144* 201*  BUN 13 14  CREATININE 0.92 1.12  CALCIUM 8.9 8.9    PT/INR:  Recent Labs    08/26/22 1911  LABPROT 14.0  INR 1.1   ABG    Component Value Date/Time   PHART 7.46 (H) 08/27/2022 0553   HCO3 26.3 08/27/2022 0553   O2SAT 90.8 08/27/2022 0553   CBG (last 3)  Recent Labs    08/27/22 1526 08/27/22 2110 08/28/22 0630  GLUCAP 143* 162* 136*    Assessment/Plan: S/P Procedure(s) (LRB): XI ROBOTIC ASSISTED THORACOSCOPY LEFT WEDGE RESECTION WITH PLEURECTOMY, MECHANICAL PLEURADESIS (Left) INTERCOSTAL NERVE BLOCK  Spontaneous Pneumothorax, S/p  Robotic VATS with bleb resection/pleurectomy... CT with 1+ air leak.. leave on suction x 48 hours CXR w/o pneumothorax, + atelectasis....will add flutter valve Renal- slight bump in creatinine likely due to toradol use, will d/c  Dispo- advance diet, care per primary   LOS: 6 days    Lowella Dandy, PA-C 08/28/2022   Agree with above Continue CT to suction for now IS, ambulation  Jesus Bush

## 2022-08-28 NOTE — Progress Notes (Signed)
Rounding Note    Patient Name: Jesus Bush Date of Encounter: 08/29/2022  [facility] Cardiologist: None   Subjective   Doing well this morning. No chest pain or SOB. Hoping to go home soon.    Inpatient Medications    Scheduled Meds:  acetaminophen  1,000 mg Oral Q6H   Or   acetaminophen (TYLENOL) oral liquid 160 mg/5 mL  1,000 mg Oral Q6H   bisacodyl  10 mg Oral Daily   diltiazem  120 mg Oral Daily   docusate sodium  100 mg Oral BID   enoxaparin (LOVENOX) injection  40 mg Subcutaneous Daily   fluticasone furoate-vilanterol  1 puff Inhalation Daily   And   umeclidinium bromide  1 puff Inhalation Daily   montelukast  10 mg Oral Daily   nicotine  21 mg Transdermal Daily   pantoprazole  40 mg Oral BID   polyethylene glycol  17 g Oral BID   rosuvastatin  40 mg Oral Daily   senna-docusate  1 tablet Oral QHS   sodium chloride flush  10 mL Intrapleural Q8H   Continuous Infusions:   PRN Meds: acetaminophen **OR** acetaminophen, albuterol, albuterol, alum & mag hydroxide-simeth, hydrALAZINE, ipratropium-albuterol, morphine injection, morphine injection, ondansetron (ZOFRAN) IV, ondansetron **OR** [DISCONTINUED] ondansetron (ZOFRAN) IV, oxyCODONE, traMADol   Vital Signs    Vitals:   08/28/22 1927 08/28/22 2341 08/29/22 0308 08/29/22 0722  BP: (!) 132/58 136/70 136/73 (!) 142/74  Pulse: 70 78 67 74  Resp: 18 18 16 14   Temp: 98.7 F (37.1 C) 98.3 F (36.8 C) 98.5 F (36.9 C) 98.5 F (36.9 C)  TempSrc: Oral Oral Oral Oral  SpO2: 92% 92% 90% 96%  Weight:      Height:        Intake/Output Summary (Last 24 hours) at 08/29/2022 0807 Last data filed at 08/29/2022 0534 Gross per 24 hour  Intake 418.7 ml  Output 190 ml  Net 228.7 ml      08/27/2022    3:16 PM 08/24/2022    9:16 AM 07/18/2022    3:32 PM  Last 3 Weights  Weight (lbs) 166 lb 0.1 oz 162 lb 165 lb 12.8 oz  Weight (kg) 75.3 kg 73.483 kg 75.206 kg      Telemetry    NSR with frequent PVCs-  Personally Reviewed  ECG    No new tracing- Personally Reviewed  Physical Exam   GEN: No acute distress.   Neck: No JVD Cardiac: RRR, 1/6 systolic murmur Respiratory: Diminished but clear. CT in place GI: Soft, mildly distended, non-tender to palpation MS: No edema; No deformity. Neuro:  Nonfocal  Psych: Normal affect   Labs    High Sensitivity Troponin:   Recent Labs  Lab 08/22/22 1248 08/22/22 1414  TROPONINIHS 9 8     Chemistry Recent Labs  Lab 08/22/22 1248 08/23/22 0423 08/25/22 1530 08/26/22 0324 08/28/22 0031 08/29/22 0029  NA 139   < > 135 135 134* 135  K 4.0   < > 4.1 4.0 4.3 3.8  CL 102   < > 98 99 99 102  CO2 26   < > 25 24 27 26   GLUCOSE 147*   < > 142* 144* 201* 134*  BUN 11   < > 12 13 14 18   CREATININE 1.31*   < > 1.01 0.92 1.12 1.01  CALCIUM 9.7   < > 9.9 8.9 8.9 8.8*  MG  --   --  2.3  --   --  2.4  PROT 6.9  --   --   --   --  6.4*  ALBUMIN 3.8  --   --   --   --  3.1*  AST 43*  --   --   --   --  18  ALT 42  --   --   --   --  15  ALKPHOS 81  --   --   --   --  61  BILITOT 0.5  --   --   --   --  0.4  GFRNONAA 56*   < > >60 >60 >60 >60  ANIONGAP 11   < > 12 12 8 7    < > = values in this interval not displayed.    Lipids No results for input(s): "CHOL", "TRIG", "HDL", "LABVLDL", "LDLCALC", "CHOLHDL" in the last 168 hours.  Hematology Recent Labs  Lab 08/27/22 0537 08/28/22 0031 08/29/22 0029  WBC 10.5 15.3* 14.5*  RBC 4.13* 3.93* 3.90*  HGB 13.1 12.5* 12.4*  HCT 39.1 37.2* 36.5*  MCV 94.7 94.7 93.6  MCH 31.7 31.8 31.8  MCHC 33.5 33.6 34.0  RDW 12.6 12.5 12.6  PLT 252 237 270   Thyroid  Recent Labs  Lab 08/25/22 1530  TSH 1.481  FREET4 1.06    BNPNo results for input(s): "BNP", "PROBNP" in the last 168 hours.  DDimer No results for input(s): "DDIMER" in the last 168 hours.   Radiology    DG Chest Port 1 View  Result Date: 08/28/2022 CLINICAL DATA:  Status post surgery EXAM: PORTABLE CHEST 1 VIEW COMPARISON:  Chest  x-ray dated August 27, 2022 FINDINGS: Cardiac and mediastinal contours are unchanged. Stable postsurgical changes of the left hemithorax. Left-sided chest tube in place. Mild bibasilar opacities, likely due to atelectasis. No evidence of pleural effusion. No visible pneumothorax. IMPRESSION: 1. Bibasilar atelectasis. 2. Left-sided chest tube in place. No visible pneumothorax. Electronically Signed   By: Allegra Lai M.D.   On: 08/28/2022 10:51   DG Chest Port 1 View  Result Date: 08/27/2022 CLINICAL DATA:  Status post lung surgery. EXAM: PORTABLE CHEST 1 VIEW COMPARISON:  08/26/2022. FINDINGS: Interval left apical surgical staple lines. Interval left chest tube with its tip at the left lung apex. There may be an approximately 5% left apical pneumothorax surrounding the chest tube. No pneumothorax visualized elsewhere in the left hemithorax were on the right. Mild linear atelectasis at the right lung base. Improved atelectasis at the left lung base. Normal sized heart. Calcified thoracic aortic arch. Biapical pleural and parenchymal scarring. Thoracic spine degenerative changes. IMPRESSION: 1. Probable approximately 5% left apical pneumothorax surrounding the left chest tube. 2. Improved left basilar atelectasis. 3. Mild right basilar atelectasis. These results will be called to the ordering clinician or representative by the Radiologist Assistant, and communication documented in the PACS or [employer]. Electronically Signed   By: Beckie Salts M.D.   On: 08/27/2022 15:45    Cardiac Studies   Cardiac Studies & Procedures       ECHOCARDIOGRAM  ECHOCARDIOGRAM COMPLETE 08/26/2022  Narrative ECHOCARDIOGRAM REPORT    Patient Name:   Jesus Bush Date of Exam: 08/26/2022 Medical Rec #:  [id number]       Height:       70.0 in Accession #:    [id number]      Weight:       162.0 lb Date of Birth:  03-Jan-1946      BSA:  1.909 m Patient Age:    76 years        BP:           126/66  mmHg Patient Gender: M               HR:           73 bpm. Exam Location:  Inpatient  Procedure: 2D Echo, Color Doppler and Cardiac Doppler  Indications:    atrial flutter  History:        Patient has no prior history of Echocardiogram examinations. COPD, Arrythmias:bigeminy and PVC; Risk Factors:Hypertension, Dyslipidemia and Current Smoker.  Sonographer:    [name] RDCS Referring Phys: [id number] [address] [place]  IMPRESSIONS   1. Left ventricular ejection fraction, by estimation, is 60 to 65%. The left ventricle has normal function. The left ventricle has no regional wall motion abnormalities. There is mild left ventricular hypertrophy. Left ventricular diastolic parameters are indeterminate. 2. Right ventricular systolic function is normal. The right ventricular size is normal. There is normal pulmonary artery systolic pressure. The estimated right ventricular systolic pressure is 28.8 mmHg. 3. The mitral valve is normal in structure. No evidence of mitral valve regurgitation. 4. The aortic valve is tricuspid. Aortic valve regurgitation is not visualized. Aortic valve sclerosis/calcification is present, without any evidence of aortic stenosis. 5. The inferior vena cava is normal in size with greater than 50% respiratory variability, suggesting right atrial pressure of 3 mmHg.  FINDINGS Left Ventricle: Left ventricular ejection fraction, by estimation, is 60 to 65%. The left ventricle has normal function. The left ventricle has no regional wall motion abnormalities. The left ventricular internal cavity size was normal in size. There is mild left ventricular hypertrophy. Left ventricular diastolic parameters are indeterminate.  Right Ventricle: The right ventricular size is normal. No increase in right ventricular wall thickness. Right ventricular systolic function is normal. There is normal pulmonary artery systolic pressure. The tricuspid regurgitant velocity is 2.54 m/s,  and with an assumed right atrial pressure of 3 mmHg, the estimated right ventricular systolic pressure is 28.8 mmHg.  Left Atrium: Left atrial size was normal in size.  Right Atrium: Right atrial size was normal in size.  Pericardium: Trivial pericardial effusion is present.  Mitral Valve: The mitral valve is normal in structure. No evidence of mitral valve regurgitation.  Tricuspid Valve: The tricuspid valve is normal in structure. Tricuspid valve regurgitation is trivial.  Aortic Valve: The aortic valve is tricuspid. Aortic valve regurgitation is not visualized. Aortic valve sclerosis/calcification is present, without any evidence of aortic stenosis.  Pulmonic Valve: The pulmonic valve was not well visualized. Pulmonic valve regurgitation is not visualized.  Aorta: The aortic root and ascending aorta are structurally normal, with no evidence of dilitation.  Venous: The inferior vena cava is normal in size with greater than 50% respiratory variability, suggesting right atrial pressure of 3 mmHg.  IAS/Shunts: The interatrial septum was not well visualized.   LEFT VENTRICLE PLAX 2D LVIDd:         4.50 cm   Diastology LVIDs:         3.40 cm   LV e' medial:    6.42 cm/s LV PW:         1.10 cm   LV E/e' medial:  8.6 LV IVS:        1.10 cm   LV e' lateral:   7.72 cm/s LVOT diam:     2.10 cm   LV E/e' lateral: 7.2 LV  SV:         49 LV SV Index:   26 LVOT Area:     3.46 cm   RIGHT VENTRICLE             IVC RV Basal diam:  2.60 cm     IVC diam: 1.70 cm RV S prime:     15.40 cm/s TAPSE (M-mode): 1.9 cm  LEFT ATRIUM           Index        RIGHT ATRIUM           Index LA diam:      4.00 cm 2.10 cm/m   RA Area:     13.30 cm LA Vol (A4C): 44.4 ml 23.26 ml/m  RA Volume:   28.50 ml  14.93 ml/m AORTIC VALVE LVOT Vmax:   79.20 cm/s LVOT Vmean:  51.400 cm/s LVOT VTI:    0.142 m  AORTA Ao Root diam: 3.00 cm Ao Asc diam:  3.10 cm  MITRAL VALVE               TRICUSPID VALVE MV  Area (PHT): 3.12 cm    TR Peak grad:   25.8 mmHg MV Decel Time: 243 msec    TR Vmax:        254.00 cm/s MV E velocity: 55.50 cm/s MV A velocity: 46.00 cm/s  SHUNTS MV E/A ratio:  1.21        Systemic VTI:  0.14 m Systemic Diam: 2.10 cm  Epifanio Lesches MD Electronically signed by Epifanio Lesches MD Signature Date/Time: 08/26/2022/12:35:35 PM    Final              Patient Profile     77 y.o. male with COPD with bullous emphysema, tobacco abuse, HTN, HLD, DMII who presented with epigastric discomfort and bloating found to have a large left sided pneumothorax s/p CT placement with course complicated by Aflutter/fib for which Cardiology was consulted.  Assessment & Plan    #pAFlutter/Fib: -Newly diagnosed this admission in the setting of PTX -Spontaneously converted on 08/27/22 and has been maintaining NSR on the monitor -Resume AC once cleared by CT surgery perspective (plan for apixaban long-term) -Increase dilt to 240mg  daily -Avoiding BB due to significant COPD -Will arrange for CV follow-up  #Pneumothorax #COPD with Bullous Emphysema: -S/p left wedge resection with pleurectomy and mechanical pleurodesis with Dr. Cliffton Asters -Post-op care per CT surgery  #HLD: -Continue crestor 40mg  daily  #DMII: -Management per primary  Cardiology will sign-off. Will arrange CV follow-up.       For questions or updates, please contact [facility] Please consult [email or web address] for contact info under        Signed, Meriam Sprague, MD  08/29/2022, 8:07 AM

## 2022-08-28 NOTE — Progress Notes (Signed)
Rounding Note    Patient Name: Jesus Bush Date of Encounter: 08/28/2022  [facility] Cardiologist: None   Subjective   Feels well today. Denies pain, SOB or palpitations.  Went to OR for left upper lobe wedge resection, apical pleurectomy, and mechanical pleurodesis with Dr. Cliffton Asters.  Doing well this morning and maintaining NSR  Inpatient Medications    Scheduled Meds:  acetaminophen  1,000 mg Oral Q6H   Or   acetaminophen (TYLENOL) oral liquid 160 mg/5 mL  1,000 mg Oral Q6H   bisacodyl  10 mg Oral Daily   docusate sodium  100 mg Oral BID   enoxaparin (LOVENOX) injection  40 mg Subcutaneous Daily   fluticasone furoate-vilanterol  1 puff Inhalation Daily   And   umeclidinium bromide  1 puff Inhalation Daily   montelukast  10 mg Oral Daily   nicotine  21 mg Transdermal Daily   pantoprazole  40 mg Oral BID   polyethylene glycol  17 g Oral BID   rosuvastatin  40 mg Oral Daily   senna-docusate  1 tablet Oral QHS   sodium chloride flush  10 mL Intrapleural Q8H   Continuous Infusions:  diltiazem (CARDIZEM) infusion 10 mg/hr (08/28/22 0748)   PRN Meds: acetaminophen **OR** acetaminophen, albuterol, albuterol, alum & mag hydroxide-simeth, hydrALAZINE, ipratropium-albuterol, morphine injection, morphine injection, ondansetron (ZOFRAN) IV, ondansetron **OR** [DISCONTINUED] ondansetron (ZOFRAN) IV, oxyCODONE, traMADol   Vital Signs    Vitals:   08/27/22 1957 08/27/22 2339 08/28/22 0459 08/28/22 0736  BP: 118/60 112/66 (!) 121/59 (!) 116/55  Pulse: 75 63 68 67  Resp: 15 13 16 20   Temp: 98.3 F (36.8 C) 98.5 F (36.9 C) 97.9 F (36.6 C) 97.6 F (36.4 C)  TempSrc: Oral Oral Oral Oral  SpO2: 96% 97% 95% 93%  Weight:      Height:        Intake/Output Summary (Last 24 hours) at 08/28/2022 0753 Last data filed at 08/28/2022 0500 Gross per 24 hour  Intake 1550.04 ml  Output 805 ml  Net 745.04 ml       08/27/2022    3:16 PM 08/24/2022    9:16 AM  07/18/2022    3:32 PM  Last 3 Weights  Weight (lbs) 166 lb 0.1 oz 162 lb 165 lb 12.8 oz  Weight (kg) 75.3 kg 73.483 kg 75.206 kg      Telemetry    NSR with occasional PVCs- Personally Reviewed  ECG    No new tracing- Personally Reviewed  Physical Exam   GEN: No acute distress.   Neck: No JVD Cardiac: RRR, no murmurs, rubs, or gallops.  Respiratory: Diminished but clear. CT in place GI: Soft, nontender, non-distended  MS: No edema; No deformity. Neuro:  Nonfocal  Psych: Normal affect   Labs    High Sensitivity Troponin:   Recent Labs  Lab 08/22/22 1248 08/22/22 1414  TROPONINIHS 9 8      Chemistry Recent Labs  Lab 08/22/22 1248 08/23/22 0423 08/25/22 1530 08/26/22 0324 08/28/22 0031  NA 139   < > 135 135 134*  K 4.0   < > 4.1 4.0 4.3  CL 102   < > 98 99 99  CO2 26   < > 25 24 27   GLUCOSE 147*   < > 142* 144* 201*  BUN 11   < > 12 13 14   CREATININE 1.31*   < > 1.01 0.92 1.12  CALCIUM 9.7   < > 9.9 8.9 8.9  MG  --   --  2.3  --   --   PROT 6.9  --   --   --   --   ALBUMIN 3.8  --   --   --   --   AST 43*  --   --   --   --   ALT 42  --   --   --   --   ALKPHOS 81  --   --   --   --   BILITOT 0.5  --   --   --   --   GFRNONAA 56*   < > >60 >60 >60  ANIONGAP 11   < > 12 12 8    < > = values in this interval not displayed.     Lipids No results for input(s): "CHOL", "TRIG", "HDL", "LABVLDL", "LDLCALC", "CHOLHDL" in the last 168 hours.  Hematology Recent Labs  Lab 08/26/22 0324 08/27/22 0537 08/28/22 0031  WBC 9.1 10.5 15.3*  RBC 4.33 4.13* 3.93*  HGB 13.7 13.1 12.5*  HCT 41.5 39.1 37.2*  MCV 95.8 94.7 94.7  MCH 31.6 31.7 31.8  MCHC 33.0 33.5 33.6  RDW 12.7 12.6 12.5  PLT 224 252 237    Thyroid  Recent Labs  Lab 08/25/22 1530  TSH 1.481  FREET4 1.06     BNPNo results for input(s): "BNP", "PROBNP" in the last 168 hours.  DDimer No results for input(s): "DDIMER" in the last 168 hours.   Radiology    DG Chest Port 1 View  Result  Date: 08/27/2022 CLINICAL DATA:  Status post lung surgery. EXAM: PORTABLE CHEST 1 VIEW COMPARISON:  08/26/2022. FINDINGS: Interval left apical surgical staple lines. Interval left chest tube with its tip at the left lung apex. There may be an approximately 5% left apical pneumothorax surrounding the chest tube. No pneumothorax visualized elsewhere in the left hemithorax were on the right. Mild linear atelectasis at the right lung base. Improved atelectasis at the left lung base. Normal sized heart. Calcified thoracic aortic arch. Biapical pleural and parenchymal scarring. Thoracic spine degenerative changes. IMPRESSION: 1. Probable approximately 5% left apical pneumothorax surrounding the left chest tube. 2. Improved left basilar atelectasis. 3. Mild right basilar atelectasis. These results will be called to the ordering clinician or representative by the Radiologist Assistant, and communication documented in the PACS or [employer]. Electronically Signed   By: Beckie Salts M.D.   On: 08/27/2022 15:45   DG Chest 2 View  Result Date: 08/26/2022 CLINICAL DATA:  Pneumothorax, chest tube in place EXAM: CHEST - 2 VIEW COMPARISON:  08/25/2022 FINDINGS: Frontal and lateral views of the chest demonstrate stable pigtail drainage catheter within the left anterior lower hemithorax. Cardiac silhouette is unchanged. Persistent bibasilar hypoventilatory changes. There may be trace bilateral pleural effusions, left greater than right, not appreciably changed. No evidence of pneumothorax. No acute bony abnormality. IMPRESSION: 1. Stable indwelling left chest tube. No evidence of residual or recurrent pneumothorax. 2. Bibasilar hypoventilatory changes and trace bilateral pleural effusions, not appreciably changed since prior exam. Electronically Signed   By: Sharlet Salina M.D.   On: 08/26/2022 19:38   ECHOCARDIOGRAM COMPLETE  Result Date: 08/26/2022    ECHOCARDIOGRAM REPORT   Patient Name:   Jesus Bush Date of Exam:  08/26/2022 Medical Rec #:  [id number]       Height:       70.0 in Accession #:    [id number]      Weight:  162.0 lb Date of Birth:  1945/09/07      BSA:          1.909 m Patient Age:    77 years        BP:           126/66 mmHg Patient Gender: M               HR:           73 bpm. Exam Location:  Inpatient Procedure: 2D Echo, Color Doppler and Cardiac Doppler Indications:    atrial flutter  History:        Patient has no prior history of Echocardiogram examinations.                 COPD, Arrythmias:bigeminy and PVC; Risk Factors:Hypertension,                 Dyslipidemia and Current Smoker.  Sonographer:    [name] RDCS Referring Phys: 29 DAWOOD S [place] IMPRESSIONS  1. Left ventricular ejection fraction, by estimation, is 60 to 65%. The left ventricle has normal function. The left ventricle has no regional wall motion abnormalities. There is mild left ventricular hypertrophy. Left ventricular diastolic parameters are indeterminate.  2. Right ventricular systolic function is normal. The right ventricular size is normal. There is normal pulmonary artery systolic pressure. The estimated right ventricular systolic pressure is 28.8 mmHg.  3. The mitral valve is normal in structure. No evidence of mitral valve regurgitation.  4. The aortic valve is tricuspid. Aortic valve regurgitation is not visualized. Aortic valve sclerosis/calcification is present, without any evidence of aortic stenosis.  5. The inferior vena cava is normal in size with greater than 50% respiratory variability, suggesting right atrial pressure of 3 mmHg. FINDINGS  Left Ventricle: Left ventricular ejection fraction, by estimation, is 60 to 65%. The left ventricle has normal function. The left ventricle has no regional wall motion abnormalities. The left ventricular internal cavity size was normal in size. There is  mild left ventricular hypertrophy. Left ventricular diastolic parameters are indeterminate. Right Ventricle: The right  ventricular size is normal. No increase in right ventricular wall thickness. Right ventricular systolic function is normal. There is normal pulmonary artery systolic pressure. The tricuspid regurgitant velocity is 2.54 m/s, and  with an assumed right atrial pressure of 3 mmHg, the estimated right ventricular systolic pressure is 28.8 mmHg. Left Atrium: Left atrial size was normal in size. Right Atrium: Right atrial size was normal in size. Pericardium: Trivial pericardial effusion is present. Mitral Valve: The mitral valve is normal in structure. No evidence of mitral valve regurgitation. Tricuspid Valve: The tricuspid valve is normal in structure. Tricuspid valve regurgitation is trivial. Aortic Valve: The aortic valve is tricuspid. Aortic valve regurgitation is not visualized. Aortic valve sclerosis/calcification is present, without any evidence of aortic stenosis. Pulmonic Valve: The pulmonic valve was not well visualized. Pulmonic valve regurgitation is not visualized. Aorta: The aortic root and ascending aorta are structurally normal, with no evidence of dilitation. Venous: The inferior vena cava is normal in size with greater than 50% respiratory variability, suggesting right atrial pressure of 3 mmHg. IAS/Shunts: The interatrial septum was not well visualized.  LEFT VENTRICLE PLAX 2D LVIDd:         4.50 cm   Diastology LVIDs:         3.40 cm   LV e' medial:    6.42 cm/s LV PW:         1.10  cm   LV E/e' medial:  8.6 LV IVS:        1.10 cm   LV e' lateral:   7.72 cm/s LVOT diam:     2.10 cm   LV E/e' lateral: 7.2 LV SV:         49 LV SV Index:   26 LVOT Area:     3.46 cm  RIGHT VENTRICLE             IVC RV Basal diam:  2.60 cm     IVC diam: 1.70 cm RV S prime:     15.40 cm/s TAPSE (M-mode): 1.9 cm LEFT ATRIUM           Index        RIGHT ATRIUM           Index LA diam:      4.00 cm 2.10 cm/m   RA Area:     13.30 cm LA Vol (A4C): 44.4 ml 23.26 ml/m  RA Volume:   28.50 ml  14.93 ml/m  AORTIC VALVE LVOT Vmax:    79.20 cm/s LVOT Vmean:  51.400 cm/s LVOT VTI:    0.142 m  AORTA Ao Root diam: 3.00 cm Ao Asc diam:  3.10 cm MITRAL VALVE               TRICUSPID VALVE MV Area (PHT): 3.12 cm    TR Peak grad:   25.8 mmHg MV Decel Time: 243 msec    TR Vmax:        254.00 cm/s MV E velocity: 55.50 cm/s MV A velocity: 46.00 cm/s  SHUNTS MV E/A ratio:  1.21        Systemic VTI:  0.14 m                            Systemic Diam: 2.10 cm Epifanio Lesches MD Electronically signed by Epifanio Lesches MD Signature Date/Time: 08/26/2022/12:35:35 PM    Final     Cardiac Studies   Cardiac Studies & Procedures      ECHOCARDIOGRAM  ECHOCARDIOGRAM COMPLETE 08/26/2022  Narrative ECHOCARDIOGRAM REPORT    Patient Name:   MORGAN MCLANE Date of Exam: 08/26/2022 Medical Rec #:  [id number]       Height:       70.0 in Accession #:    [id number]      Weight:       162.0 lb Date of Birth:  06/11/1945      BSA:          1.909 m Patient Age:    76 years        BP:           126/66 mmHg Patient Gender: M               HR:           73 bpm. Exam Location:  Inpatient  Procedure: 2D Echo, Color Doppler and Cardiac Doppler  Indications:    atrial flutter  History:        Patient has no prior history of Echocardiogram examinations. COPD, Arrythmias:bigeminy and PVC; Risk Factors:Hypertension, Dyslipidemia and Current Smoker.  Sonographer:    [name] RDCS Referring Phys: 31 DAWOOD S [place]GER[place]Y  IMPRESSIONS   1. Left ventricular ejection fraction, by estimation, is 60 to 65%. The left ventricle has normal function. The left ventricle has no regional wall motion abnormalities. There is mild left ventricular  hypertrophy. Left ventricular diastolic parameters are indeterminate. 2. Right ventricular systolic function is normal. The right ventricular size is normal. There is normal pulmonary artery systolic pressure. The estimated right ventricular systolic pressure is 28.8 mmHg. 3. The mitral valve is normal in  structure. No evidence of mitral valve regurgitation. 4. The aortic valve is tricuspid. Aortic valve regurgitation is not visualized. Aortic valve sclerosis/calcification is present, without any evidence of aortic stenosis. 5. The inferior vena cava is normal in size with greater than 50% respiratory variability, suggesting right atrial pressure of 3 mmHg.  FINDINGS Left Ventricle: Left ventricular ejection fraction, by estimation, is 60 to 65%. The left ventricle has normal function. The left ventricle has no regional wall motion abnormalities. The left ventricular internal cavity size was normal in size. There is mild left ventricular hypertrophy. Left ventricular diastolic parameters are indeterminate.  Right Ventricle: The right ventricular size is normal. No increase in right ventricular wall thickness. Right ventricular systolic function is normal. There is normal pulmonary artery systolic pressure. The tricuspid regurgitant velocity is 2.54 m/s, and with an assumed right atrial pressure of 3 mmHg, the estimated right ventricular systolic pressure is 28.8 mmHg.  Left Atrium: Left atrial size was normal in size.  Right Atrium: Right atrial size was normal in size.  Pericardium: Trivial pericardial effusion is present.  Mitral Valve: The mitral valve is normal in structure. No evidence of mitral valve regurgitation.  Tricuspid Valve: The tricuspid valve is normal in structure. Tricuspid valve regurgitation is trivial.  Aortic Valve: The aortic valve is tricuspid. Aortic valve regurgitation is not visualized. Aortic valve sclerosis/calcification is present, without any evidence of aortic stenosis.  Pulmonic Valve: The pulmonic valve was not well visualized. Pulmonic valve regurgitation is not visualized.  Aorta: The aortic root and ascending aorta are structurally normal, with no evidence of dilitation.  Venous: The inferior vena cava is normal in size with greater than 50% respiratory  variability, suggesting right atrial pressure of 3 mmHg.  IAS/Shunts: The interatrial septum was not well visualized.   LEFT VENTRICLE PLAX 2D LVIDd:         4.50 cm   Diastology LVIDs:         3.40 cm   LV e' medial:    6.42 cm/s LV PW:         1.10 cm   LV E/e' medial:  8.6 LV IVS:        1.10 cm   LV e' lateral:   7.72 cm/s LVOT diam:     2.10 cm   LV E/e' lateral: 7.2 LV SV:         49 LV SV Index:   26 LVOT Area:     3.46 cm   RIGHT VENTRICLE             IVC RV Basal diam:  2.60 cm     IVC diam: 1.70 cm RV S prime:     15.40 cm/s TAPSE (M-mode): 1.9 cm  LEFT ATRIUM           Index        RIGHT ATRIUM           Index LA diam:      4.00 cm 2.10 cm/m   RA Area:     13.30 cm LA Vol (A4C): 44.4 ml 23.26 ml/m  RA Volume:   28.50 ml  14.93 ml/m AORTIC VALVE LVOT Vmax:   79.20 cm/s LVOT Vmean:  51.400 cm/s LVOT VTI:  0.142 m  AORTA Ao Root diam: 3.00 cm Ao Asc diam:  3.10 cm  MITRAL VALVE               TRICUSPID VALVE MV Area (PHT): 3.12 cm    TR Peak grad:   25.8 mmHg MV Decel Time: 243 msec    TR Vmax:        254.00 cm/s MV E velocity: 55.50 cm/s MV A velocity: 46.00 cm/s  SHUNTS MV E/A ratio:  1.21        Systemic VTI:  0.14 m Systemic Diam: 2.10 cm  Epifanio Lesches MD Electronically signed by Epifanio Lesches MD Signature Date/Time: 08/26/2022/12:35:35 PM    Final              Patient Profile     77 y.o. male with COPD with bullous emphysema, tobacco abuse, HTN, HLD, DMII who presented with epigastric discomfort and bloating found to have a large left sided pneumothorax s/p CT placement with course complicated by Aflutter/fib for which Cardiology was consulted.  Assessment & Plan    #pAFlutter/Fib: -Newly diagnosed this admission in the setting of PTX -Spontaneously converted on 08/27/22 and has been maintaining NSR on the monitor -Resume AC once cleared by CT surgery perspective (plan for apixaban long-term) -Change from dilt gtt to dilt  120mg  daily -Avoiding BB due to significant COPD  #Pneumothorax #COPD with Bullous Emphysema: -S/p left wedge resection with pleurectomy and mechanical pleurodesis with Dr. Cliffton Asters -Post-op care per CT surgery  #HLD: -Continue crestor 40mg  daily  #DMII: -Management per primary      For questions or updates, please contact [facility] Please consult [email or web address] for contact info under        Signed, Meriam Sprague, MD  08/28/2022, 7:53 AM

## 2022-08-28 NOTE — Plan of Care (Signed)

## 2022-08-28 NOTE — Progress Notes (Signed)
Physical Therapy Treatment Patient Details Name: Jesus Bush MRN: [id number] DOB: Oct 08, 1945 Today's Date: 08/28/2022   History of Present Illness Patient is 77 y.o. male who presented to ED secondary to complaints of bloating, epigastric pain, reports pain has started earlier this morning around 4 AM, has been intermittent. CT chest/abdomen, and CTA chest, which did show large left-sided pneumothorax, so chest tube has been inserted by ED physician, with follow-up chest x-ray showing mental and near reexpansion of the left lung. PMH significant for active tobacco abuse, COPD, hypertension, hyperlipidemia, aortic atherosclerosis, diabetes mellitus.    PT Comments    Pt received in chair, agreeable to therapy session and with good participation and tolerance for gait trial without AD and PTA assisting with line mgmt. Pt needing up to Supervision for gait/stairs and transfers with cues for line awareness intermittently, SpO2 90% and greater on RA with exertional tasks this date when good pleth signal achieved. Pt reports 0-1/10 modified RPE (minimal to no fatigue) at end of session. Pt continues to benefit from PT services to progress toward functional mobility goals.    Recommendations for follow up therapy are one component of a multi-disciplinary discharge planning process, led by the attending physician.  Recommendations may be updated based on patient status, additional functional criteria and insurance authorization.  Follow Up Recommendations       Assistance Recommended at Discharge Intermittent Supervision/Assistance  Patient can return home with the following A little help with walking and/or transfers;A little help with bathing/dressing/bathroom;Assistance with cooking/housework   Equipment Recommendations  None recommended by PT    Recommendations for Other Services       Precautions / Restrictions Precautions Precautions: Fall Precaution Comments: watch  O2 Restrictions Weight Bearing Restrictions: No     Mobility  Bed Mobility Overal bed mobility: Modified Independent             General bed mobility comments: use of bed features    Transfers Overall transfer level: Needs assistance Equipment used: None Transfers: Sit to/from Stand Sit to Stand: Supervision           General transfer comment: sup for safety with cues for line management    Ambulation/Gait Ambulation/Gait assistance: Supervision Gait Distance (Feet): 400 Feet Assistive device: None Gait Pattern/deviations: Step-through pattern, Decreased stride length, Trunk flexed Gait velocity: decr     General Gait Details: pt with slightly flexed trunk secondary to side discomfort from CT, no overt LOB. Pt SpO2 90% and greater on RA with exertion, improves to 92% on RA with return to chair. HR WFL. No c/o SOB, CP, dizziness.   Stairs Stairs: Yes Stairs assistance: Supervision Stair Management: One rail Right, Alternating pattern, Forwards Number of Stairs: 4 General stair comments: pt descended/ascended 4 steps in stairwell without LOB or buckling, pt states 2 or 3 STE mobile home where he is going next week to the beach.   Wheelchair Mobility    Modified Rankin (Stroke Patients Only)       Balance Overall balance assessment: Mild deficits observed, not formally tested                                          Cognition Arousal/Alertness: Awake/alert Behavior During Therapy: WFL for tasks assessed/performed Overall Cognitive Status: Within Functional Limits for tasks assessed  Exercises      General Comments General comments (skin integrity, edema, etc.): see gait comments; CT site c/d/i pre/post and back on suction after gait trial      Pertinent Vitals/Pain Pain Assessment Pain Assessment: No/denies pain     PT Goals (current goals can now be found in the  care plan section) Acute Rehab PT Goals Patient Stated Goal: get home and to the beach next week PT Goal Formulation: With patient/family Time For Goal Achievement: 09/08/22 Progress towards PT goals: Progressing toward goals    Frequency    Min 3X/week      PT Plan Current plan remains appropriate       AM-PAC PT "6 Clicks" Mobility   Outcome Measure  Help needed turning from your back to your side while in a flat bed without using bedrails?: None Help needed moving from lying on your back to sitting on the side of a flat bed without using bedrails?: None Help needed moving to and from a bed to a chair (including a wheelchair)?: A Little Help needed standing up from a chair using your arms (e.g., wheelchair or bedside chair)?: A Little Help needed to walk in hospital room?: A Little Help needed climbing 3-5 steps with a railing? : A Little 6 Click Score: 20    End of Session Equipment Utilized During Treatment: Gait belt Activity Tolerance: Patient tolerated treatment well Patient left: with call bell/phone within reach;with family/visitor present;in chair Nurse Communication: Mobility status;Other (comment) (VS) PT Visit Diagnosis: Muscle weakness (generalized) (M62.81);Other abnormalities of gait and mobility (R26.89);Unsteadiness on feet (R26.81);Difficulty in walking, not elsewhere classified (R26.2)     Time: 1610-9604 PT Time Calculation (min) (ACUTE ONLY): 11 min  Charges:  $Gait Training: 8-22 mins                     Talyssa Gibas P., PTA Acute Rehabilitation Services Secure Chat Preferred 9a-5:30pm Office: [phone]    Dorathy Kinsman [facility] 08/28/2022, 6:07 PM

## 2022-08-28 NOTE — Discharge Instructions (Addendum)
Discharge Instructions:  1. You may shower, please wash incisions daily with soap and water and keep dry.  If you wish to cover wounds with dressing you may do so but please keep clean and change daily.  No tub baths or swimming until incisions have completely healed.  If your incisions become red or develop any drainage please call our office at [phone]  2. No Driving until cleared by Dr. Cliffton Asters office and you are no longer using narcotic pain medications  3. Fever of 101.5 for at least 24 hours with no source, please contact our office at [phone]  4. Activity- up as tolerated, please walk at least 3 times per day.  Avoid strenuous activity, no lifting, pushing, or pulling with your arms over 8-10 lbs for a minimum of 6 weeks  5. If any questions or concerns arise, please do not hesitate to contact our office at [phone]  ________________________________________________________________________________________________________________________________ Information on my medicine - ELIQUIS (apixaban)  This medication education was reviewed with me or my healthcare representative as part of my discharge preparation.  The pharmacist that spoke with me during my hospital stay was:  Doristine Counter, [facility]  Why was Eliquis prescribed for you? Eliquis was prescribed for you to reduce the risk of a blood clot forming that can cause a stroke if you have a medical condition called atrial fibrillation (a type of irregular heartbeat).  What do You need to know about Eliquis ? Take your Eliquis TWICE DAILY - one tablet in the morning and one tablet in the evening with or without food. If you have difficulty swallowing the tablet whole please discuss with your pharmacist how to take the medication safely.  Take Eliquis exactly as prescribed by your doctor and DO NOT stop taking Eliquis without talking to the doctor who prescribed the medication.  Stopping may increase your risk of  developing a stroke.  Refill your prescription before you run out.  After discharge, you should have regular check-up appointments with your healthcare provider that is prescribing your Eliquis.  In the future your dose may need to be changed if your kidney function or weight changes by a significant amount or as you get older.  What do you do if you miss a dose? If you miss a dose, take it as soon as you remember on the same day and resume taking twice daily.  Do not take more than one dose of ELIQUIS at the same time to make up a missed dose.  Important Safety Information A possible side effect of Eliquis is bleeding. You should call your healthcare provider right away if you experience any of the following: Bleeding from an injury or your nose that does not stop. Unusual colored urine (red or dark brown) or unusual colored stools (red or black). Unusual bruising for unknown reasons. A serious fall or if you hit your head (even if there is no bleeding).  Some medicines may interact with Eliquis and might increase your risk of bleeding or clotting while on Eliquis. To help avoid this, consult your healthcare provider or pharmacist prior to using any new prescription or non-prescription medications, including herbals, vitamins, non-steroidal anti-inflammatory drugs (NSAIDs) and supplements.  This website has more information on Eliquis (apixaban): http://www.eliquis.com/eliquis/home

## 2022-08-28 NOTE — Progress Notes (Signed)
Mobility Specialist Progress Note:    08/28/22 1100  Mobility  Activity Ambulated with assistance in hallway  Level of Assistance Modified independent, requires aide device or extra time  Assistive Device None  Distance Ambulated (ft) 260 ft  Activity Response Tolerated well  Mobility Referral Yes  $Mobility charge 1 Mobility  Mobility Specialist Start Time (ACUTE ONLY) 1017  Mobility Specialist Stop Time (ACUTE ONLY) 1026  Mobility Specialist Time Calculation (min) (ACUTE ONLY) 9 min   Pt received exiting BR agreeable to ambulate. No c/o throughout session. Pt returned to chair w/ call bell in hand and chest tube hooked back up.   Thompson Grayer Mobility Specialist  Please contact vis Secure Chat or  Rehab Office [phone]

## 2022-08-28 NOTE — Progress Notes (Signed)
PROGRESS NOTE    Jesus Bush  WGN:[id number] DOB: 1945-08-02 DOA: 08/22/2022 PCP: [name], MD   Brief Narrative:   Jesus Bush  is a 77 y.o. male, with past medical history of active tobacco abuse, COPD, hypertension, hyperlipidemia, aortic atherosclerosis, diabetes mellitus, presented to the hospital with bloating, epigastric pain radiating to his lower chest and subsequent dyspnea.  No history of trauma.  CT chest/abdomen, and CTA chest, was notable for large left-sided pneumothorax, so chest tube was inserted by ED physician, PCCM were consulted.  Subsequently CT surgery was consulted for VATS pleurodesis. Hospital was complicated by patient developing new onset a flutter for which he has been started on heparin  and Cardizem gtt as per cardiology.  Assessment & Plan:   Principal Problem:   Spontaneous pneumothorax Active Problems:   Abnormal glucose (prediabetes)   Essential hypertension   Mixed hyperlipidemia   Esophageal reflux   COPD (chronic obstructive pulmonary disease) (HCC)   Aortic atherosclerosis (HCC)   Hyperthyroidism   Tobacco dependence   Paroxysmal atrial fibrillation (HCC)   Spontaneous left-sided pneumothorax Most likely in the setting of history of smoking, and COPD.  Patient did have a chest tube placement.  CT surgery was consulted and patient underwent robotic assisted thorascopic evaluation with left upper lobe wedge resection, apical pleurectomy and mechanical pleurodesis and intercostal nerve block on 08/27/2022.  Follow CT surgery recommendations.  COPD -Continue with Breo and Incruse, nebulizers.  Overt dyspnea or pain today.   Tobacco abuse Continue nicotine patch.   History of CAD Continue statins.  Aspirin on hold.  New onset a flutter Had atrial flutter 08/25/2022 on heparin and Cardizem drip. TSH level within normal limits.  2D echocardiogram with preserved LV ejection fraction.   GERD Plan is to increase dose of Protonix on  discharge and follow-up with GI as outpatient.  Hyperlipidemia Continue statins   Hypertension On Cardizem drip.  Losartan on hold.  Follow cardiology recommendation.  Diabetes mellitus Diet controlled.  Latest hemoglobin A1c is 6.6 , diabetic diet.  Constipation Continue MiraLAX/Senokot and Dulcolax.   DVT prophylaxis: Heparin subcu  Code Status: Full  Family Communication: Spoke with the patient's son and wife at bedside and updated them about the clinical condition of the patient. . Disposition: When okay with CT surgery.  Status is: Inpatient    Consultants:  PCCM CT surgery Cardiology   Subjective:  Today, patient was seen and examined at bedside.  Family at bedside.  Denies any chest pain, dyspnea or overt shortness of breath.  Has had bowel movements.  Was able to eat some.  Sitting on the chair.     Objective: Vitals:   08/27/22 1516 08/27/22 1957 08/27/22 2339 08/28/22 0459  BP: 104/69 118/60 112/66 (!) 121/59  Pulse: 93 75 63 68  Resp: 20 15 13 16   Temp: 98.2 F (36.8 C) 98.3 F (36.8 C) 98.5 F (36.9 C) 97.9 F (36.6 C)  TempSrc: Oral Oral Oral Oral  SpO2: 95% 96% 97% 95%  Weight: 75.3 kg     Height: 5\' 10"  (1.778 m)       Intake/Output Summary (Last 24 hours) at 08/28/2022 7846 Last data filed at 08/28/2022 0500 Gross per 24 hour  Intake 1550.04 ml  Output 805 ml  Net 745.04 ml    Filed Weights   08/24/22 0916 08/27/22 1516  Weight: 73.5 kg 75.3 kg    Physical examination:  General:  Average built, not in obvious distress, Communicative, not in distress,  HENT:   No scleral pallor or icterus noted. Oral mucosa is moist.  Chest: Left-sided chest tube in place.  Diminished breath sounds bilaterally.  Coarse breath sounds are heard CVS: S1 &S2 heard. No murmur.  Regular rate and rhythm. Abdomen: Soft, nontender, nondistended.  Bowel sounds are heard.   Extremities: No cyanosis, clubbing or edema.  Peripheral pulses are palpable. Psych:  Alert, awake and oriented, normal mood CNS:  No cranial nerve deficits.  Power equal in all extremities.   Skin: Warm and dry.  No rashes noted.  Data Reviewed: I have personally reviewed following labs and imaging studies  CBC: Recent Labs  Lab 08/22/22 1248 08/23/22 0423 08/25/22 1530 08/26/22 0324 08/27/22 0537 08/28/22 0031  WBC 10.5 8.7 16.5* 9.1 10.5 15.3*  NEUTROABS 7.6  --   --   --   --   --   HGB 14.8 13.8 14.5 13.7 13.1 12.5*  HCT 44.8 41.3 43.3 41.5 39.1 37.2*  MCV 94.7 96.7 95.4 95.8 94.7 94.7  PLT 252 248 272 224 252 237     Basic Metabolic Panel: Recent Labs  Lab 08/22/22 1248 08/23/22 0423 08/25/22 1530 08/26/22 0324 08/28/22 0031  NA 139 138 135 135 134*  K 4.0 4.2 4.1 4.0 4.3  CL 102 104 98 99 99  CO2 26 25 25 24 27   GLUCOSE 147* 147* 142* 144* 201*  BUN 11 12 12 13 14   CREATININE 1.31* 1.06 1.01 0.92 1.12  CALCIUM 9.7 9.2 9.9 8.9 8.9  MG  --   --  2.3  --   --      GFR: Estimated Creatinine Clearance: 57 mL/min (by C-G formula based on SCr of 1.12 mg/dL).  Liver Function Tests: Recent Labs  Lab 08/22/22 1248  AST 43*  ALT 42  ALKPHOS 81  BILITOT 0.5  PROT 6.9  ALBUMIN 3.8     CBG: Recent Labs  Lab 08/27/22 1033 08/27/22 1421 08/27/22 1526 08/27/22 2110 08/28/22 0630  GLUCAP 125* 131* 143* 162* 136*      Recent Results (from the past 240 hour(s))  SARS Coronavirus 2 by RT PCR (hospital order, performed in [facility] hospital lab) *cepheid single result test* Anterior Nasal Swab     Status: None   Collection Time: 08/26/22  6:32 PM   Specimen: Anterior Nasal Swab  Result Value Ref Range Status   SARS Coronavirus 2 by RT PCR NEGATIVE NEGATIVE Final    Comment: Performed at [facility], [address], [place], [place] [address]  Surgical pcr screen     Status: None   Collection Time: 08/27/22  7:21 AM   Specimen: Nasal Mucosa; Nasal Swab  Result Value Ref Range Status   MRSA, PCR NEGATIVE NEGATIVE Final    Staphylococcus aureus NEGATIVE NEGATIVE Final    Comment: (NOTE) The Xpert SA Assay (FDA approved for NASAL specimens in patients 8 years of age and older), is one component of a comprehensive surveillance program. It is not intended to diagnose infection nor to guide or monitor treatment. Performed at [facility], [address] [place], [place] [address]       Radiology Studies: DG Chest Port 1 View  Result Date: 08/27/2022 CLINICAL DATA:  Status post lung surgery. EXAM: PORTABLE CHEST 1 VIEW COMPARISON:  08/26/2022. FINDINGS: Interval left apical surgical staple lines. Interval left chest tube with its tip at the left lung apex. There may be an approximately 5% left apical pneumothorax surrounding the chest  tube. No pneumothorax visualized elsewhere in the left hemithorax were on the right. Mild linear atelectasis at the right lung base. Improved atelectasis at the left lung base. Normal sized heart. Calcified thoracic aortic arch. Biapical pleural and parenchymal scarring. Thoracic spine degenerative changes. IMPRESSION: 1. Probable approximately 5% left apical pneumothorax surrounding the left chest tube. 2. Improved left basilar atelectasis. 3. Mild right basilar atelectasis. These results will be called to the ordering clinician or representative by the Radiologist Assistant, and communication documented in the PACS or [employer]. Electronically Signed   By: Beckie Salts M.D.   On: 08/27/2022 15:45   DG Chest 2 View  Result Date: 08/26/2022 CLINICAL DATA:  Pneumothorax, chest tube in place EXAM: CHEST - 2 VIEW COMPARISON:  08/25/2022 FINDINGS: Frontal and lateral views of the chest demonstrate stable pigtail drainage catheter within the left anterior lower hemithorax. Cardiac silhouette is unchanged. Persistent bibasilar hypoventilatory changes. There may be trace bilateral pleural effusions, left greater than right, not appreciably changed. No evidence of pneumothorax. No  acute bony abnormality. IMPRESSION: 1. Stable indwelling left chest tube. No evidence of residual or recurrent pneumothorax. 2. Bibasilar hypoventilatory changes and trace bilateral pleural effusions, not appreciably changed since prior exam. Electronically Signed   By: Sharlet Salina M.D.   On: 08/26/2022 19:38   ECHOCARDIOGRAM COMPLETE  Result Date: 08/26/2022    ECHOCARDIOGRAM REPORT   Patient Name:   Jesus Bush Date of Exam: 08/26/2022 Medical Rec #:  [id number]       Height:       70.0 in Accession #:    [id number]      Weight:       162.0 lb Date of Birth:  1946-02-12      BSA:          1.909 m Patient Age:    56 years        BP:           126/66 mmHg Patient Gender: M               HR:           73 bpm. Exam Location:  Inpatient Procedure: 2D Echo, Color Doppler and Cardiac Doppler Indications:    atrial flutter  History:        Patient has no prior history of Echocardiogram examinations.                 COPD, Arrythmias:bigeminy and PVC; Risk Factors:Hypertension,                 Dyslipidemia and Current Smoker.  Sonographer:    [name] RDCS Referring Phys: [id number] [address] [place] IMPRESSIONS  1. Left ventricular ejection fraction, by estimation, is 60 to 65%. The left ventricle has normal function. The left ventricle has no regional wall motion abnormalities. There is mild left ventricular hypertrophy. Left ventricular diastolic parameters are indeterminate.  2. Right ventricular systolic function is normal. The right ventricular size is normal. There is normal pulmonary artery systolic pressure. The estimated right ventricular systolic pressure is 28.8 mmHg.  3. The mitral valve is normal in structure. No evidence of mitral valve regurgitation.  4. The aortic valve is tricuspid. Aortic valve regurgitation is not visualized. Aortic valve sclerosis/calcification is present, without any evidence of aortic stenosis.  5. The inferior vena cava is normal in size with greater than 50% respiratory  variability, suggesting right atrial pressure of 3 mmHg. FINDINGS  Left Ventricle: Left ventricular ejection  fraction, by estimation, is 60 to 65%. The left ventricle has normal function. The left ventricle has no regional wall motion abnormalities. The left ventricular internal cavity size was normal in size. There is  mild left ventricular hypertrophy. Left ventricular diastolic parameters are indeterminate. Right Ventricle: The right ventricular size is normal. No increase in right ventricular wall thickness. Right ventricular systolic function is normal. There is normal pulmonary artery systolic pressure. The tricuspid regurgitant velocity is 2.54 m/s, and  with an assumed right atrial pressure of 3 mmHg, the estimated right ventricular systolic pressure is 28.8 mmHg. Left Atrium: Left atrial size was normal in size. Right Atrium: Right atrial size was normal in size. Pericardium: Trivial pericardial effusion is present. Mitral Valve: The mitral valve is normal in structure. No evidence of mitral valve regurgitation. Tricuspid Valve: The tricuspid valve is normal in structure. Tricuspid valve regurgitation is trivial. Aortic Valve: The aortic valve is tricuspid. Aortic valve regurgitation is not visualized. Aortic valve sclerosis/calcification is present, without any evidence of aortic stenosis. Pulmonic Valve: The pulmonic valve was not well visualized. Pulmonic valve regurgitation is not visualized. Aorta: The aortic root and ascending aorta are structurally normal, with no evidence of dilitation. Venous: The inferior vena cava is normal in size with greater than 50% respiratory variability, suggesting right atrial pressure of 3 mmHg. IAS/Shunts: The interatrial septum was not well visualized.  LEFT VENTRICLE PLAX 2D LVIDd:         4.50 cm   Diastology LVIDs:         3.40 cm   LV e' medial:    6.42 cm/s LV PW:         1.10 cm   LV E/e' medial:  8.6 LV IVS:        1.10 cm   LV e' lateral:   7.72 cm/s LVOT diam:      2.10 cm   LV E/e' lateral: 7.2 LV SV:         49 LV SV Index:   26 LVOT Area:     3.46 cm  RIGHT VENTRICLE             IVC RV Basal diam:  2.60 cm     IVC diam: 1.70 cm RV S prime:     15.40 cm/s TAPSE (M-mode): 1.9 cm LEFT ATRIUM           Index        RIGHT ATRIUM           Index LA diam:      4.00 cm 2.10 cm/m   RA Area:     13.30 cm LA Vol (A4C): 44.4 ml 23.26 ml/m  RA Volume:   28.50 ml  14.93 ml/m  AORTIC VALVE LVOT Vmax:   79.20 cm/s LVOT Vmean:  51.400 cm/s LVOT VTI:    0.142 m  AORTA Ao Root diam: 3.00 cm Ao Asc diam:  3.10 cm MITRAL VALVE               TRICUSPID VALVE MV Area (PHT): 3.12 cm    TR Peak grad:   25.8 mmHg MV Decel Time: 243 msec    TR Vmax:        254.00 cm/s MV E velocity: 55.50 cm/s MV A velocity: 46.00 cm/s  SHUNTS MV E/A ratio:  1.21        Systemic VTI:  0.14 m  Systemic Diam: 2.10 cm Epifanio Lesches MD Electronically signed by Epifanio Lesches MD Signature Date/Time: 08/26/2022/12:35:35 PM    Final      Scheduled Meds:  acetaminophen  1,000 mg Oral Q6H   Or   acetaminophen (TYLENOL) oral liquid 160 mg/5 mL  1,000 mg Oral Q6H   bisacodyl  10 mg Oral Daily   docusate sodium  100 mg Oral BID   enoxaparin (LOVENOX) injection  40 mg Subcutaneous Daily   fluticasone furoate-vilanterol  1 puff Inhalation Daily   And   umeclidinium bromide  1 puff Inhalation Daily   ketorolac  15 mg Intravenous Q6H   montelukast  10 mg Oral Daily   nicotine  21 mg Transdermal Daily   pantoprazole  40 mg Oral BID   polyethylene glycol  17 g Oral BID   rosuvastatin  40 mg Oral Daily   senna-docusate  1 tablet Oral QHS   sodium chloride flush  10 mL Intrapleural Q8H   Continuous Infusions:  diltiazem (CARDIZEM) infusion 10 mg/hr (08/27/22 2107)     LOS: 6 days      Joycelyn Das, MD [facility] 08/28/2022, 7:22 AM

## 2022-08-29 ENCOUNTER — Inpatient Hospital Stay (HOSPITAL_COMMUNITY): Payer: Medicare HMO

## 2022-08-29 DIAGNOSIS — J9383 Other pneumothorax: Secondary | ICD-10-CM | POA: Diagnosis not present

## 2022-08-29 DIAGNOSIS — F17200 Nicotine dependence, unspecified, uncomplicated: Secondary | ICD-10-CM | POA: Diagnosis not present

## 2022-08-29 DIAGNOSIS — E782 Mixed hyperlipidemia: Secondary | ICD-10-CM | POA: Diagnosis not present

## 2022-08-29 DIAGNOSIS — J449 Chronic obstructive pulmonary disease, unspecified: Secondary | ICD-10-CM | POA: Diagnosis not present

## 2022-08-29 LAB — COMPREHENSIVE METABOLIC PANEL
ALT: 15 U/L (ref 0–44)
AST: 18 U/L (ref 15–41)
Albumin: 3.1 g/dL — ABNORMAL LOW (ref 3.5–5.0)
Alkaline Phosphatase: 61 U/L (ref 38–126)
Anion gap: 7 (ref 5–15)
BUN: 18 mg/dL (ref 8–23)
CO2: 26 mmol/L (ref 22–32)
Calcium: 8.8 mg/dL — ABNORMAL LOW (ref 8.9–10.3)
Chloride: 102 mmol/L (ref 98–111)
Creatinine, Ser: 1.01 mg/dL (ref 0.61–1.24)
GFR, Estimated: >60 mL/min (ref >60)
Glucose, Bld: 134 mg/dL — ABNORMAL HIGH (ref 70–99)
Potassium: 3.8 mmol/L (ref 3.5–5.1)
Sodium: 135 mmol/L (ref 135–145)
Total Bilirubin: 0.4 mg/dL (ref 0.3–1.2)
Total Protein: 6.4 g/dL — ABNORMAL LOW (ref 6.5–8.1)

## 2022-08-29 LAB — CBC
HCT: 36.5 % — ABNORMAL LOW (ref 39.0–52.0)
Hemoglobin: 12.4 g/dL — ABNORMAL LOW (ref 13.0–17.0)
MCH: 31.8 pg (ref 26.0–34.0)
MCHC: 34 g/dL (ref 30.0–36.0)
MCV: 93.6 fL (ref 80.0–100.0)
Platelets: 270 10*3/uL (ref 150–400)
RBC: 3.9 MIL/uL — ABNORMAL LOW (ref 4.22–5.81)
RDW: 12.6 % (ref 11.5–15.5)
WBC: 14.5 10*3/uL — ABNORMAL HIGH (ref 4.0–10.5)
nRBC: 0 % (ref 0.0–0.2)

## 2022-08-29 LAB — GLUCOSE, CAPILLARY
Glucose-Capillary: 115 mg/dL — ABNORMAL HIGH (ref 70–99)
Glucose-Capillary: 156 mg/dL — ABNORMAL HIGH (ref 70–99)
Glucose-Capillary: 122 mg/dL — ABNORMAL HIGH (ref 70–99)
Glucose-Capillary: 145 mg/dL — ABNORMAL HIGH (ref 70–99)

## 2022-08-29 LAB — MAGNESIUM: Magnesium: 2.4 mg/dL (ref 1.7–2.4)

## 2022-08-29 MED ORDER — LACTULOSE 10 GM/15ML PO SOLN: 20.0000 g | Freq: Every day | ORAL | Status: DC | PRN

## 2022-08-29 MED ORDER — ALUM & MAG HYDROXIDE-SIMETH 200-200-20 MG/5ML PO SUSP: 30.0000 mL | ORAL | Status: DC | PRN

## 2022-08-29 MED ORDER — MELATONIN 3 MG PO TABS
3.0000 mg | ORAL_TABLET | Freq: Every day | ORAL | Status: DC
  Administered 2022-08-29 – 2022-08-30 (×2): 3 mg via ORAL
  Filled 2022-08-29 (×2): qty 1

## 2022-08-29 MED ORDER — DILTIAZEM HCL ER COATED BEADS 240 MG PO CP24
240.0000 mg | ORAL_CAPSULE | Freq: Every day | ORAL | Status: DC
  Administered 2022-08-29 – 2022-08-31 (×3): 240 mg via ORAL
  Filled 2022-08-29 (×3): qty 1

## 2022-08-29 NOTE — Progress Notes (Signed)
Mobility Specialist Progress Note:   08/29/22 1020  Mobility  Activity Ambulated with assistance in hallway  Level of Assistance Standby assist, set-up cues, supervision of patient - no hands on  Assistive Device None  Distance Ambulated (ft) 300 ft  Activity Response Tolerated well  Mobility Referral Yes  $Mobility charge 1 Mobility  Mobility Specialist Start Time (ACUTE ONLY) 1020  Mobility Specialist Stop Time (ACUTE ONLY) 1033  Mobility Specialist Time Calculation (min) (ACUTE ONLY) 13 min   Pt eager for mobility session. No physical assistance required. Pt asx throughout. Back in chair with all needs met.   Addison Lank Mobility Specialist Please contact via SecureChat or  Rehab office at [phone]

## 2022-08-29 NOTE — Progress Notes (Signed)
[facility] PROGRESS NOTE    Progress Note  Jesus Bush  ZOX:[id number] DOB: Jul 29, 1945 DOA: 08/22/2022 PCP: [name], MD     Brief Narrative:   Jesus Bush is an 77 y.o. male past medical history of tobacco abuse, COPD, essential hypertension diabetes mellitus was into the hospital for epigastric abdominal pain CT scan of the abdomen and CTA of the chest was notable for left-sided pneumothorax chest tube was placed by ED physician PCCM was consulted subsequently CT surgery was consulted for possible VATS, hospital course was complicated by a flutter for which she was started on heparin and IV Cardizem cardiology was consulted.  As he underwent robotic assisted intervention heparin was held now Lovenox for DVT prophylaxis   Assessment/Plan:   Large left spontaneous pneumothorax: CT surgery was consulted and patient underwent robotic assisted thoracoscopy and underwent left upper lobe wedge resection and apical pleurectomy and mechanical pleurodesis with nerve block on 08/27/2022. Recommended transition to waterseal this evening repeat a chest x-ray in the morning.  New onset a flutter: New on 08/25/2022 started on heparin and Cardizem drip. 2D echo showed preserved EF. Cardiology was consulted, he eventually converted to sinus rhythm on 08/27/2022 Plan for apixaban as an outpatient. Diltiazem has been transition to oral 240 mg daily. CT surgery to dictate when to start anticoagulation probably needed DOAC.  Diabetes mellitus type 2: A1c of 6.6. Diet control. Currently on no insulin blood glucose well-controlled.  COPD: Continue inhalers.  Tobacco abuse: Continue getting patch.  History of CAD: Continue statin, aspirin is on hold. Hyperlipidemia:  continue statins.  GERD: Continue PPI.  Hyperlipidemia: continue statins.  Essential hypertension: Holding losartan currently on Cardizem orally.  Blood pressures slightly on the high side. Can resume ACE  inhibitor as an outpatient.  Constipation Continue MiraLAX p.o. twice daily.  DVT prophylaxis: Loveno for DVT Family Communication:son's Status is: Inpatient Remains inpatient appropriate because: Large continues pneumothorax    Code Status:     Code Status Orders  (From admission, onward)           Start     Ordered   08/22/22 1619  Full code  Continuous       Question:  By:  Answer:  Consent: discussion documented in EHR   08/22/22 1620           Code Status History     This patient has a current code status but no historical code status.         IV Access:   Peripheral IV   Procedures and diagnostic studies:   DG Chest Port 1 View  Result Date: 08/28/2022 CLINICAL DATA:  Status post surgery EXAM: PORTABLE CHEST 1 VIEW COMPARISON:  Chest x-ray dated August 27, 2022 FINDINGS: Cardiac and mediastinal contours are unchanged. Stable postsurgical changes of the left hemithorax. Left-sided chest tube in place. Mild bibasilar opacities, likely due to atelectasis. No evidence of pleural effusion. No visible pneumothorax. IMPRESSION: 1. Bibasilar atelectasis. 2. Left-sided chest tube in place. No visible pneumothorax. Electronically Signed   By: Allegra Lai M.D.   On: 08/28/2022 10:51   DG Chest Port 1 View  Result Date: 08/27/2022 CLINICAL DATA:  Status post lung surgery. EXAM: PORTABLE CHEST 1 VIEW COMPARISON:  08/26/2022. FINDINGS: Interval left apical surgical staple lines. Interval left chest tube with its tip at the left lung apex. There may be an approximately 5% left apical pneumothorax surrounding the chest tube. No pneumothorax visualized elsewhere in the left hemithorax were  on the right. Mild linear atelectasis at the right lung base. Improved atelectasis at the left lung base. Normal sized heart. Calcified thoracic aortic arch. Biapical pleural and parenchymal scarring. Thoracic spine degenerative changes. IMPRESSION: 1. Probable approximately 5% left  apical pneumothorax surrounding the left chest tube. 2. Improved left basilar atelectasis. 3. Mild right basilar atelectasis. These results will be called to the ordering clinician or representative by the Radiologist Assistant, and communication documented in the PACS or [employer]. Electronically Signed   By: Beckie Salts M.D.   On: 08/27/2022 15:45     Medical Consultants:   None.   Subjective:    Jesus Bush no complaints feels great bowel movement was 2 days ago  Objective:    Vitals:   08/28/22 2341 08/29/22 0308 08/29/22 0722 08/29/22 0808  BP: 136/70 136/73 (!) 142/74   Pulse: 78 67 74   Resp: 18 16 14    Temp: 98.3 F (36.8 C) 98.5 F (36.9 C) 98.5 F (36.9 C)   TempSrc: Oral Oral Oral   SpO2: 92% 90% 96% 94%  Weight:      Height:       SpO2: 94 % O2 Flow Rate (L/min): 3 L/min   Intake/Output Summary (Last 24 hours) at 08/29/2022 0825 Last data filed at 08/29/2022 0534 Gross per 24 hour  Intake 418.7 ml  Output 190 ml  Net 228.7 ml   Filed Weights   08/24/22 0916 08/27/22 1516  Weight: 73.5 kg 75.3 kg    Exam: General exam: In no acute distress. Respiratory system: Good air movement and clear to auscultation. Cardiovascular system: S1 & S2 heard, RRR. No JVD.  Gastrointestinal system: Abdomen is nondistended, soft and nontender.  Extremities: No pedal edema. Skin: No rashes, lesions or ulcers Psychiatry: Judgement and insight appear normal. Mood & affect appropriate.    Data Reviewed:    Labs: Basic Metabolic Panel: Recent Labs  Lab 08/23/22 0423 08/25/22 1530 08/26/22 0324 08/28/22 0031 08/29/22 0029  NA 138 135 135 134* 135  K 4.2 4.1 4.0 4.3 3.8  CL 104 98 99 99 102  CO2 25 25 24 27 26   GLUCOSE 147* 142* 144* 201* 134*  BUN 12 12 13 14 18   CREATININE 1.06 1.01 0.92 1.12 1.01  CALCIUM 9.2 9.9 8.9 8.9 8.8*  MG  --  2.3  --   --  2.4   GFR Estimated Creatinine Clearance: 63.2 mL/min (by C-G formula based on SCr of 1.01  mg/dL). Liver Function Tests: Recent Labs  Lab 08/22/22 1248 08/29/22 0029  AST 43* 18  ALT 42 15  ALKPHOS 81 61  BILITOT 0.5 0.4  PROT 6.9 6.4*  ALBUMIN 3.8 3.1*   Recent Labs  Lab 08/22/22 1248  LIPASE 44   No results for input(s): "AMMONIA" in the last 168 hours. Coagulation profile Recent Labs  Lab 08/26/22 1911  INR 1.1   COVID-19 Labs  No results for input(s): "DDIMER", "FERRITIN", "LDH", "CRP" in the last 72 hours.  Lab Results  Component Value Date   SARSCOV2NAA NEGATIVE 08/26/2022   SARSCOV2NAA Not Detected 02/25/2019    CBC: Recent Labs  Lab 08/22/22 1248 08/23/22 0423 08/25/22 1530 08/26/22 0324 08/27/22 0537 08/28/22 0031 08/29/22 0029  WBC 10.5   < > 16.5* 9.1 10.5 15.3* 14.5*  NEUTROABS 7.6  --   --   --   --   --   --   HGB 14.8   < > 14.5 13.7 13.1 12.5* 12.4*  HCT 44.8   < > 43.3 41.5 39.1 37.2* 36.5*  MCV 94.7   < > 95.4 95.8 94.7 94.7 93.6  PLT 252   < > 272 224 252 237 270   < > = values in this interval not displayed.   Cardiac Enzymes: No results for input(s): "CKTOTAL", "CKMB", "CKMBINDEX", "TROPONINI" in the last 168 hours. BNP (last 3 results) No results for input(s): "PROBNP" in the last 8760 hours. CBG: Recent Labs  Lab 08/28/22 0630 08/28/22 1058 08/28/22 1603 08/28/22 2133 08/29/22 0622  GLUCAP 136* 152* 119* 138* 115*   D-Dimer: No results for input(s): "DDIMER" in the last 72 hours. Hgb A1c: No results for input(s): "HGBA1C" in the last 72 hours. Lipid Profile: No results for input(s): "CHOL", "HDL", "LDLCALC", "TRIG", "CHOLHDL", "LDLDIRECT" in the last 72 hours. Thyroid function studies: No results for input(s): "TSH", "T4TOTAL", "T3FREE", "THYROIDAB" in the last 72 hours.  Invalid input(s): "FREET3" Anemia work up: No results for input(s): "VITAMINB12", "FOLATE", "FERRITIN", "TIBC", "IRON", "RETICCTPCT" in the last 72 hours. Sepsis Labs: Recent Labs  Lab 08/22/22 1231 08/22/22 1248 08/26/22 0324  08/27/22 0537 08/28/22 0031 08/29/22 0029  WBC  --    < > 9.1 10.5 15.3* 14.5*  LATICACIDVEN 1.7  --   --   --   --   --    < > = values in this interval not displayed.   Microbiology Recent Results (from the past 240 hour(s))  SARS Coronavirus 2 by RT PCR (hospital order, performed in [facility] hospital lab) *cepheid single result test* Anterior Nasal Swab     Status: None   Collection Time: 08/26/22  6:32 PM   Specimen: Anterior Nasal Swab  Result Value Ref Range Status   SARS Coronavirus 2 by RT PCR NEGATIVE NEGATIVE Final    Comment: Performed at [facility], [address], [place], [place] [address]  Surgical pcr screen     Status: None   Collection Time: 08/27/22  7:21 AM   Specimen: Nasal Mucosa; Nasal Swab  Result Value Ref Range Status   MRSA, PCR NEGATIVE NEGATIVE Final   Staphylococcus aureus NEGATIVE NEGATIVE Final    Comment: (NOTE) The Xpert SA Assay (FDA approved for NASAL specimens in patients 30 years of age and older), is one component of a comprehensive surveillance program. It is not intended to diagnose infection nor to guide or monitor treatment. Performed at [facility], [address], [place], [place] [address]      Medications:    acetaminophen  1,000 mg Oral Q6H   Or   acetaminophen (TYLENOL) oral liquid 160 mg/5 mL  1,000 mg Oral Q6H   bisacodyl  10 mg Oral Daily   diltiazem  240 mg Oral Daily   docusate sodium  100 mg Oral BID   enoxaparin (LOVENOX) injection  40 mg Subcutaneous Daily   fluticasone furoate-vilanterol  1 puff Inhalation Daily   And   umeclidinium bromide  1 puff Inhalation Daily   montelukast  10 mg Oral Daily   nicotine  21 mg Transdermal Daily   pantoprazole  40 mg Oral BID   polyethylene glycol  17 g Oral BID   rosuvastatin  40 mg Oral Daily   senna-docusate  1 tablet Oral QHS   sodium chloride flush  10 mL Intrapleural Q8H   Continuous Infusions:    LOS: 7 days   Marinda Elk  [facility]  08/29/2022, 8:25 AM

## 2022-08-29 NOTE — Progress Notes (Addendum)
      [address]       [place] [address]             [phone]      2 Days Post-Op Procedure(s) (LRB): XI ROBOTIC ASSISTED THORACOSCOPY LEFT WEDGE RESECTION WITH PLEURECTOMY, MECHANICAL PLEURADESIS (Left) INTERCOSTAL NERVE BLOCK  Subjective:  Patient wants to go home.  Denies pain.  He feels like he needs to move his bowels, despite mag citrate a few days ago.  Denies N/V.  Objective: Vital signs in last 24 hours: Temp:  [98.2 F (36.8 C)-98.7 F (37.1 C)] 98.5 F (36.9 C) (06/05 0722) Pulse Rate:  [67-78] 74 (06/05 0722) Cardiac Rhythm: Normal sinus rhythm (06/05 0717) Resp:  [14-20] 14 (06/05 0722) BP: (115-142)/(56-74) 142/74 (06/05 0722) SpO2:  [90 %-96 %] 94 % (06/05 0808)  Intake/Output from previous day: 06/04 0701 - 06/05 0700 In: 680.7 [P.O.:480; I.V.:200.7] Out: 270 [Urine:150; Chest Tube:120]  General appearance: alert, cooperative, and no distress Heart: regular rate and rhythm Lungs: clear to auscultation bilaterally Abdomen: soft, non-tender; bowel sounds normal; no masses,  no organomegaly Extremities: extremities normal, atraumatic, no cyanosis or edema Wound: clean and dry  Lab Results: Recent Labs    08/28/22 0031 08/29/22 0029  WBC 15.3* 14.5*  HGB 12.5* 12.4*  HCT 37.2* 36.5*  PLT 237 270   BMET:  Recent Labs    08/28/22 0031 08/29/22 0029  NA 134* 135  K 4.3 3.8  CL 99 102  CO2 27 26  GLUCOSE 201* 134*  BUN 14 18  CREATININE 1.12 1.01  CALCIUM 8.9 8.8*    PT/INR:  Recent Labs    08/26/22 1911  LABPROT 14.0  INR 1.1   ABG    Component Value Date/Time   PHART 7.46 (H) 08/27/2022 0553   HCO3 26.3 08/27/2022 0553   O2SAT 90.8 08/27/2022 0553   CBG (last 3)  Recent Labs    08/28/22 1603 08/28/22 2133 08/29/22 0622  GLUCAP 119* 138* 115*    Assessment/Plan: S/P Procedure(s) (LRB): XI ROBOTIC ASSISTED THORACOSCOPY LEFT WEDGE RESECTION WITH PLEURECTOMY, MECHANICAL PLEURADESIS  (Left) INTERCOSTAL NERVE BLOCK  CV- PAF, currently NSR- Cardiology following, additional adjustment of Cardizem today Pulm- CT w/o significant drainage.. no air leak on suction.. CXR w/o pneumothorax.. will place chest tube to water seal @ midnight, repeat CXR in AM Renal- creatinine 1.01, improved after cessation of Toradol Dispo- patient stable, will transition to water seal this evening, repeat CXR in AM.. care per primary team   LOS: 7 days    Lowella Dandy, PA-C 08/29/2022   Agree with above CT to waterseal IS, ambulation  Kyliah Deanda O Boubacar Lerette

## 2022-08-29 NOTE — Plan of Care (Signed)
  Problem: Education: Goal: Knowledge of General Education information will improve Description: Including pain rating scale, medication(s)/side effects and non-pharmacologic comfort measures Outcome: Progressing   Problem: Health Behavior/Discharge Planning: Goal: Ability to manage health-related needs will improve Outcome: Progressing   Problem: Clinical Measurements: Goal: Ability to maintain clinical measurements within normal limits will improve Outcome: Progressing Goal: Will remain free from infection Outcome: Progressing Goal: Diagnostic test results will improve Outcome: Progressing Goal: Respiratory complications will improve Outcome: Progressing Goal: Cardiovascular complication will be avoided Outcome: Progressing   Problem: Activity: Goal: Risk for activity intolerance will decrease Outcome: Progressing   Problem: Nutrition: Goal: Adequate nutrition will be maintained Outcome: Progressing   Problem: Coping: Goal: Level of anxiety will decrease Outcome: Progressing   Problem: Elimination: Goal: Will not experience complications related to bowel motility Outcome: Progressing Goal: Will not experience complications related to urinary retention Outcome: Progressing   Problem: Pain Managment: Goal: General experience of comfort will improve Outcome: Progressing   Problem: Safety: Goal: Ability to remain free from injury will improve Outcome: Progressing   Problem: Skin Integrity: Goal: Risk for impaired skin integrity will decrease Outcome: Progressing   Problem: Education: Goal: Knowledge of disease or condition will improve Outcome: Progressing Goal: Knowledge of the prescribed therapeutic regimen will improve Outcome: Progressing   Problem: Activity: Goal: Risk for activity intolerance will decrease Outcome: Progressing   Problem: Clinical Measurements: Goal: Postoperative complications will be avoided or minimized Outcome: Progressing    Problem: Respiratory: Goal: Respiratory status will improve Outcome: Progressing   Problem: Pain Management: Goal: Pain level will decrease Outcome: Progressing   Problem: Skin Integrity: Goal: Wound healing without signs and symptoms infection will improve Outcome: Progressing   

## 2022-08-29 NOTE — Progress Notes (Signed)
PT Cancellation Note  Patient Details Name: Jesus Bush MRN: [id number] DOB: 06-Apr-1945   Cancelled Treatment:    Reason Eval/Treat Not Completed: (P) Patient declined, no reason specified. Pt given ice pack for comfort at chest tube site, reviewed freq 10 mins on/30 mins off. Will continue efforts next date per PT plan of care as schedule permits.   Dorathy Kinsman Lauran Romanski 08/29/2022, 6:28 PM

## 2022-08-30 ENCOUNTER — Inpatient Hospital Stay (HOSPITAL_COMMUNITY): Payer: Medicare HMO

## 2022-08-30 ENCOUNTER — Other Ambulatory Visit (HOSPITAL_COMMUNITY): Payer: Self-pay

## 2022-08-30 DIAGNOSIS — J9383 Other pneumothorax: Secondary | ICD-10-CM | POA: Diagnosis not present

## 2022-08-30 LAB — GLUCOSE, CAPILLARY
Glucose-Capillary: 116 mg/dL — ABNORMAL HIGH (ref 70–99)
Glucose-Capillary: 195 mg/dL — ABNORMAL HIGH (ref 70–99)
Glucose-Capillary: 124 mg/dL — ABNORMAL HIGH (ref 70–99)
Glucose-Capillary: 136 mg/dL — ABNORMAL HIGH (ref 70–99)

## 2022-08-30 LAB — SURGICAL PATHOLOGY

## 2022-08-30 MED ORDER — MAGNESIUM CITRATE PO SOLN
300.0000 mL | Freq: Once | ORAL | Status: AC
  Administered 2022-08-30: 300 mL via ORAL
  Filled 2022-08-30: qty 592

## 2022-08-30 MED ORDER — GUAIFENESIN ER 600 MG PO TB12
600.0000 mg | ORAL_TABLET | Freq: Two times a day (BID) | ORAL | Status: DC
  Administered 2022-08-30 – 2022-08-31 (×3): 600 mg via ORAL
  Filled 2022-08-30 (×3): qty 1

## 2022-08-30 NOTE — TOC Benefit Eligibility Note (Signed)
Patient Product/process development scientist completed.    The patient is currently admitted and upon discharge could be taking Eliquis 5 mg.  The current 30 day co-pay is $45.00.   The patient is currently admitted and upon discharge could be taking Xarelto 20 mg.  The current 30 day co-pay is $45.00.   The patient is insured through [employer] Part d   This test claim was processed through [facility]- copay amounts may vary at other pharmacies due to pharmacy/plan contracts, or as the patient moves through the different stages of their insurance plan.  Roland Earl, CPHT Pharmacy Patient Advocate Specialist [facility] Patient Advocate Team Direct Number: [phone]  Fax: [phone]

## 2022-08-30 NOTE — Progress Notes (Addendum)
Physical Therapy Treatment Patient Details Name: Jesus Bush MRN: [id number] DOB: 1945-06-18 Today's Date: 08/30/2022   History of Present Illness Patient is 77 y.o. male who presented to ED secondary to complaints of bloating, epigastric pain, reports pain has started earlier this morning around 4 AM, has been intermittent. CT chest/abdomen, and CTA chest, which did show large left-sided pneumothorax, so chest tube has been inserted by ED physician, with follow-up chest x-ray showing mental and near reexpansion of the left lung. PMH significant for active tobacco abuse, COPD, hypertension, hyperlipidemia, aortic atherosclerosis, diabetes mellitus.    PT Comments    Pt received in recliner, agreeable to therapy session and with good participation and tolerance for transfer, gait and stair training. Pt modI this date for transfers and needing Supervision at most for gait/stair training but nearly modI for all tasks. Pt requiring rail to perform stairs and min cues for activity pacing during functional mobility tasks. Pt scored 21/24 on Dynamic Gait Index. Scores >19/24 indicate low risk of fall for older community dwelling patients. Pt denies pain and SpO2 91% and above on RA with exertion, however per monitor HR as high as 160 bpm with exertional tasks (pt asymptomatic and 0/4 DOE). Moderate fatigue post-exertion. Pt continues to benefit from PT services to progress toward functional mobility goals, anticipate pt will be able to DC acute PT in 1-2 more sessions as he has nearly achieved all goals and assist levels updated per progress.    Recommendations for follow up therapy are one component of a multi-disciplinary discharge planning process, led by the attending physician.  Recommendations may be updated based on patient status, additional functional criteria and insurance authorization.  Follow Up Recommendations       Assistance Recommended at Discharge Intermittent Supervision/Assistance   Patient can return home with the following A little help with bathing/dressing/bathroom;Assistance with cooking/housework   Equipment Recommendations  None recommended by PT    Recommendations for Other Services       Precautions / Restrictions Precautions Precautions: Fall Precaution Comments: watch O2/HR Restrictions Weight Bearing Restrictions: No     Mobility  Bed Mobility               General bed mobility comments: pt received in recliner    Transfers Overall transfer level: Modified independent Equipment used: None Transfers: Sit to/from Stand Sit to Stand: Modified independent (Device/Increase time)           General transfer comment: pt using BUE    Ambulation/Gait Ambulation/Gait assistance: Supervision Gait Distance (Feet): 700 Feet Assistive device: None Gait Pattern/deviations: Step-through pattern, Decreased stride length Gait velocity: functional Gait velocity interpretation: >2.62 ft/sec, indicative of community ambulatory   General Gait Details: Pt SpO2 91% and greater on RA with exertion, HR tachy 120's-130's bpm upon return to room, per tele monitor had been as high as 160 bpm, but pt denies acute s/sx distress during gait trial. No c/o SOB, CP, dizziness.   Stairs Stairs: Yes Stairs assistance: Modified independent (Device/Increase time) Stair Management: One rail Right, Alternating pattern, Forwards Number of Stairs: 4 General stair comments: pt descended/ascended 4 steps in stairwell without LOB or buckling, pt states 2 or 3 STE mobile home where he is going next week to the beach. no LOB or difficulty   Wheelchair Mobility    Modified Rankin (Stroke Patients Only)       Balance Overall balance assessment: Mild deficits observed, not formally tested  Standardized Balance Assessment Standardized Balance Assessment : Dynamic Gait Index   Dynamic Gait Index Level Surface:  Normal Change in Gait Speed: Mild Impairment Gait with Horizontal Head Turns: Normal Gait with Vertical Head Turns: Normal Gait and Pivot Turn: Normal Step Over Obstacle: Normal Step Around Obstacles: Mild Impairment Steps: Mild Impairment Total Score: 21      Cognition Arousal/Alertness: Awake/alert Behavior During Therapy: WFL for tasks assessed/performed Overall Cognitive Status: Within Functional Limits for tasks assessed                                 General Comments: HoH        Exercises      General Comments General comments (skin integrity, edema, etc.): tachy per monitor but pt asymptomatic during gait trial; SpO2 WFL on RA. Pt reports 5/10 modified RPE (fatigue) at end of session, he reports this is his first hallway walk today      Pertinent Vitals/Pain Pain Assessment Pain Assessment: No/denies pain    Home Living                          Prior Function            PT Goals (current goals can now be found in the care plan section) Acute Rehab PT Goals Patient Stated Goal: get home and to the beach next week PT Goal Formulation: With patient/family Time For Goal Achievement: 09/08/22 Progress towards PT goals: Progressing toward goals    Frequency    Min 3X/week      PT Plan Current plan remains appropriate       AM-PAC PT "6 Clicks" Mobility   Outcome Measure  Help needed turning from your back to your side while in a flat bed without using bedrails?: None Help needed moving from lying on your back to sitting on the side of a flat bed without using bedrails?: None Help needed moving to and from a bed to a chair (including a wheelchair)?: None Help needed standing up from a chair using your arms (e.g., wheelchair or bedside chair)?: None Help needed to walk in hospital room?: A Little Help needed climbing 3-5 steps with a railing? : A Little 6 Click Score: 22    End of Session   Activity Tolerance: Patient  tolerated treatment well;Other (comment) (HR tachy) Patient left: with call bell/phone within reach;in chair Nurse Communication: Mobility status;Other (comment) (HR elevated) PT Visit Diagnosis: Muscle weakness (generalized) (M62.81);Other abnormalities of gait and mobility (R26.89);Unsteadiness on feet (R26.81);Difficulty in walking, not elsewhere classified (R26.2)     Time: 1739-1750 PT Time Calculation (min) (ACUTE ONLY): 11 min  Charges:  $Gait Training: 8-22 mins                     Jesus Apt P., PTA Acute Rehabilitation Services Secure Chat Preferred 9a-5:30pm Office: [phone]    Dorathy Kinsman [facility] 08/30/2022, 6:27 PM

## 2022-08-30 NOTE — Progress Notes (Signed)
Patient placed in bed for chest tube removal. CT removed and site dressed. Patient resting in bed comfortably

## 2022-08-30 NOTE — Progress Notes (Addendum)
      [address]       [place] [address]             [phone]      3 Days Post-Op Procedure(s) (LRB): XI ROBOTIC ASSISTED THORACOSCOPY LEFT WEDGE RESECTION WITH PLEURECTOMY, MECHANICAL PLEURADESIS (Left) INTERCOSTAL NERVE BLOCK  Subjective:  Patient continues to complain of constipation.  He also has pain along his left chest tube site.  Objective: Vital signs in last 24 hours: Temp:  [98.3 F (36.8 C)-98.8 F (37.1 C)] 98.4 F (36.9 C) (06/06 0407) Pulse Rate:  [71-78] 78 (06/06 0407) Cardiac Rhythm: Normal sinus rhythm (06/06 0407) Resp:  [14-22] 14 (06/06 0407) BP: (120-135)/(61-75) 133/70 (06/06 0407) SpO2:  [91 %-94 %] 92 % (06/06 0407)  Intake/Output from previous day: 06/05 0701 - 06/06 0700 In: 240 [P.O.:240] Out: 330 [Urine:200; Chest Tube:130]  General appearance: alert, cooperative, and no distress Heart: regular rate and rhythm Lungs: clear to auscultation bilaterally Abdomen: + distention, soft, non-tender Extremities: extremities normal, atraumatic, no cyanosis or edema Wound: clean and dry  Lab Results: Recent Labs    08/28/22 0031 08/29/22 0029  WBC 15.3* 14.5*  HGB 12.5* 12.4*  HCT 37.2* 36.5*  PLT 237 270   BMET:  Recent Labs    08/28/22 0031 08/29/22 0029  NA 134* 135  K 4.3 3.8  CL 99 102  CO2 27 26  GLUCOSE 201* 134*  BUN 14 18  CREATININE 1.12 1.01  CALCIUM 8.9 8.8*    PT/INR: No results for input(s): "LABPROT", "INR" in the last 72 hours. ABG    Component Value Date/Time   PHART 7.46 (H) 08/27/2022 0553   HCO3 26.3 08/27/2022 0553   O2SAT 90.8 08/27/2022 0553   CBG (last 3)  Recent Labs    08/29/22 1632 08/29/22 2122 08/30/22 0621  GLUCAP 122* 145* 116*    Assessment/Plan: S/P Procedure(s) (LRB): XI ROBOTIC ASSISTED THORACOSCOPY LEFT WEDGE RESECTION WITH PLEURECTOMY, MECHANICAL PLEURADESIS (Left) INTERCOSTAL NERVE BLOCK  CV- PAF, maintaining NSR- on Hydralazine Pulm- CT on water seal,  no evidence of air leak... CXR with atelectasis, trace apical space.. will attempt clamping trial today.. if CXR remains stable, can possibly d/c chest tube later today.. continue flutter valve GI- constipation, patient received mag citrate, lactulose.. little to no BM, requesting additional dose of Mag Citrate Dispo- patient stable, clamping CT this morning, repeat CXR @ noon.. care per medicine   LOS: 8 days   Lowella Dandy, PA-C 08/30/2022   Agree with above Clamping trial today  Sarvesh Meddaugh O Veroncia Jezek

## 2022-08-30 NOTE — Plan of Care (Signed)
  Problem: Education: Goal: Knowledge of General Education information will improve Description: Including pain rating scale, medication(s)/side effects and non-pharmacologic comfort measures Outcome: Progressing   Problem: Health Behavior/Discharge Planning: Goal: Ability to manage health-related needs will improve Outcome: Progressing   Problem: Clinical Measurements: Goal: Ability to maintain clinical measurements within normal limits will improve Outcome: Progressing Goal: Diagnostic test results will improve Outcome: Progressing Goal: Respiratory complications will improve Outcome: Progressing Goal: Cardiovascular complication will be avoided Outcome: Progressing   Problem: Activity: Goal: Risk for activity intolerance will decrease Outcome: Progressing   Problem: Nutrition: Goal: Adequate nutrition will be maintained Outcome: Progressing   Problem: Coping: Goal: Level of anxiety will decrease Outcome: Progressing   Problem: Elimination: Goal: Will not experience complications related to urinary retention Outcome: Progressing   Problem: Safety: Goal: Ability to remain free from injury will improve Outcome: Progressing   Problem: Skin Integrity: Goal: Risk for impaired skin integrity will decrease Outcome: Progressing

## 2022-08-30 NOTE — Progress Notes (Signed)
[facility] PROGRESS NOTE    Progress Note  Jesus Bush  ZOX:[id number] DOB: 30-Oct-1945 DOA: 08/22/2022 PCP: [name], MD     Brief Narrative:   Jesus Bush is an 77 y.o. male past medical history of tobacco abuse, COPD, essential hypertension diabetes mellitus was into the hospital for epigastric abdominal pain CT scan of the abdomen and CTA of the chest was notable for left-sided pneumothorax chest tube was placed by ED physician PCCM was consulted subsequently CT surgery was consulted for possible VATS, hospital course was complicated by a flutter for which she was started on heparin and IV Cardizem cardiology was consulted.  As he underwent robotic assisted intervention heparin was held now Lovenox for DVT prophylaxis   Assessment/Plan:   Large left spontaneous pneumothorax: CT surgery was consulted and patient underwent robotic assisted thoracoscopy and underwent left upper lobe wedge resection and apical pleurectomy and mechanical pleurodesis with nerve block on 08/27/2022. CT surgery recommended to clamp the tube today chest x-ray remained stable possibly DC tube tomorrow. CT surgery to dictate when to start aspirin and start him on a DOAC.  New onset a flutter: New on 08/25/2022 started on heparin and Cardizem drip. 2D echo showed preserved EF. Cardiology was consulted, he eventually converted to sinus rhythm on 08/27/2022 Plan for apixaban as an outpatient. Diltiazem has been transition to oral 240 mg daily. CT surgery to dictate when to start anticoagulation probably needed DOAC.  Diabetes mellitus type 2: A1c of 6.6. Diet control. Currently on no insulin blood glucose well-controlled.  COPD: Continue inhalers.  Tobacco abuse: Continue getting patch.  History of CAD: Continue statin, aspirin is on hold. CT surgery to dictate when to restart aspirin.  Hyperlipidemia:  continue statins.  GERD: Continue PPI.  Hyperlipidemia: continue  statins.  Essential hypertension: Holding losartan currently on Cardizem orally.  Blood pressures slightly on the high side. Can resume ACE inhibitor as an outpatient.  Constipation Has not a bowel movement agree with mag citrate.  DVT prophylaxis: Loveno for DVT Family Communication:son's Status is: Inpatient Remains inpatient appropriate because: Large continues pneumothorax    Code Status:     Code Status Orders  (From admission, onward)           Start     Ordered   08/22/22 1619  Full code  Continuous       Question:  By:  Answer:  Consent: discussion documented in EHR   08/22/22 1620           Code Status History     This patient has a current code status but no historical code status.         IV Access:   Peripheral IV   Procedures and diagnostic studies:   DG CHEST PORT 1 VIEW  Result Date: 08/29/2022 CLINICAL DATA:  Status post lung surgery, follow-up exam. EXAM: PORTABLE CHEST 1 VIEW COMPARISON:  Chest x-ray dated 08/28/2022. FINDINGS: Stable postsurgical changes of the LEFT hemithorax. LEFT-sided chest tube is stable in position with tip directed towards the LEFT lung apex. No evidence of pneumothorax. Ill-defined opacity at the LEFT lung base, not significantly changed compared to recent exams, likely atelectasis and/or small pleural effusion. Probable mild atelectasis at the RIGHT lung base. No new lung findings. Heart size and mediastinal contours are stable. IMPRESSION: 1. Stable postsurgical changes of the LEFT hemithorax. LEFT-sided chest tube is stable in position. No evidence of pneumothorax. 2. No new lung findings. 3. Ill-defined opacity at the LEFT  lung base, not significantly changed compared to recent exams, likely atelectasis and/or small pleural effusion. Electronically Signed   By: Bary Richard M.D.   On: 08/29/2022 10:28     Medical Consultants:   None.   Subjective:    [name] no complaints feels  better.  Objective:    Vitals:   08/29/22 2003 08/29/22 2253 08/30/22 0407 08/30/22 0736  BP: 120/61 135/61 133/70 (!) 141/69  Pulse: 78 71 78 75  Resp: 18 19 14 20   Temp: 98.8 F (37.1 C) 98.3 F (36.8 C) 98.4 F (36.9 C) 98.4 F (36.9 C)  TempSrc: Oral Oral Oral Oral  SpO2: 91% 91% 92% 93%  Weight:      Height:       SpO2: 93 % O2 Flow Rate (L/min): 3 L/min   Intake/Output Summary (Last 24 hours) at 08/30/2022 0755 Last data filed at 08/30/2022 0600 Gross per 24 hour  Intake 240 ml  Output 330 ml  Net -90 ml    Filed Weights   08/24/22 0916 08/27/22 1516  Weight: 73.5 kg 75.3 kg    Exam: General exam: In no acute distress. Respiratory system: Good air movement and clear to auscultation. Cardiovascular system: S1 & S2 heard, RRR. No JVD.  Gastrointestinal system: Abdomen is nondistended, soft and nontender.  Extremities: No pedal edema. Skin: No rashes, lesions or ulcers Psychiatry: Judgement and insight appear normal. Mood & affect appropriate.    Data Reviewed:    Labs: Basic Metabolic Panel: Recent Labs  Lab 08/25/22 1530 08/26/22 0324 08/28/22 0031 08/29/22 0029  NA 135 135 134* 135  K 4.1 4.0 4.3 3.8  CL 98 99 99 102  CO2 25 24 27 26   GLUCOSE 142* 144* 201* 134*  BUN 12 13 14 18   CREATININE 1.01 0.92 1.12 1.01  CALCIUM 9.9 8.9 8.9 8.8*  MG 2.3  --   --  2.4    GFR Estimated Creatinine Clearance: 63.2 mL/min (by C-G formula based on SCr of 1.01 mg/dL). Liver Function Tests: Recent Labs  Lab 08/29/22 0029  AST 18  ALT 15  ALKPHOS 61  BILITOT 0.4  PROT 6.4*  ALBUMIN 3.1*    No results for input(s): "LIPASE", "AMYLASE" in the last 168 hours.  No results for input(s): "AMMONIA" in the last 168 hours. Coagulation profile Recent Labs  Lab 08/26/22 1911  INR 1.1    COVID-19 Labs  No results for input(s): "DDIMER", "FERRITIN", "LDH", "CRP" in the last 72 hours.  Lab Results  Component Value Date   SARSCOV2NAA NEGATIVE  08/26/2022   SARSCOV2NAA Not Detected 02/25/2019    CBC: Recent Labs  Lab 08/25/22 1530 08/26/22 0324 08/27/22 0537 08/28/22 0031 08/29/22 0029  WBC 16.5* 9.1 10.5 15.3* 14.5*  HGB 14.5 13.7 13.1 12.5* 12.4*  HCT 43.3 41.5 39.1 37.2* 36.5*  MCV 95.4 95.8 94.7 94.7 93.6  PLT 272 224 252 237 270    Cardiac Enzymes: No results for input(s): "CKTOTAL", "CKMB", "CKMBINDEX", "TROPONINI" in the last 168 hours. BNP (last 3 results) No results for input(s): "PROBNP" in the last 8760 hours. CBG: Recent Labs  Lab 08/29/22 0622 08/29/22 1051 08/29/22 1632 08/29/22 2122 08/30/22 0621  GLUCAP 115* 156* 122* 145* 116*    D-Dimer: No results for input(s): "DDIMER" in the last 72 hours. Hgb A1c: No results for input(s): "HGBA1C" in the last 72 hours. Lipid Profile: No results for input(s): "CHOL", "HDL", "LDLCALC", "TRIG", "CHOLHDL", "LDLDIRECT" in the last 72 hours. Thyroid function  studies: No results for input(s): "TSH", "T4TOTAL", "T3FREE", "THYROIDAB" in the last 72 hours.  Invalid input(s): "FREET3" Anemia work up: No results for input(s): "VITAMINB12", "FOLATE", "FERRITIN", "TIBC", "IRON", "RETICCTPCT" in the last 72 hours. Sepsis Labs: Recent Labs  Lab 08/26/22 0324 08/27/22 0537 08/28/22 0031 08/29/22 0029  WBC 9.1 10.5 15.3* 14.5*    Microbiology Recent Results (from the past 240 hour(s))  SARS Coronavirus 2 by RT PCR (hospital order, performed in [facility] hospital lab) *cepheid single result test* Anterior Nasal Swab     Status: None   Collection Time: 08/26/22  6:32 PM   Specimen: Anterior Nasal Swab  Result Value Ref Range Status   SARS Coronavirus 2 by RT PCR NEGATIVE NEGATIVE Final    Comment: Performed at [facility], [address], [place], [place] [address]  Surgical pcr screen     Status: None   Collection Time: 08/27/22  7:21 AM   Specimen: Nasal Mucosa; Nasal Swab  Result Value Ref Range Status   MRSA, PCR NEGATIVE NEGATIVE Final    Staphylococcus aureus NEGATIVE NEGATIVE Final    Comment: (NOTE) The Xpert SA Assay (FDA approved for NASAL specimens in patients 23 years of age and older), is one component of a comprehensive surveillance program. It is not intended to diagnose infection nor to guide or monitor treatment. Performed at [facility], [address] [place], [place] [address]      Medications:    acetaminophen  1,000 mg Oral Q6H   Or   acetaminophen (TYLENOL) oral liquid 160 mg/5 mL  1,000 mg Oral Q6H   bisacodyl  10 mg Oral Daily   diltiazem  240 mg Oral Daily   docusate sodium  100 mg Oral BID   enoxaparin (LOVENOX) injection  40 mg Subcutaneous Daily   fluticasone furoate-vilanterol  1 puff Inhalation Daily   And   umeclidinium bromide  1 puff Inhalation Daily   magnesium citrate  300 mL Oral Once   melatonin  3 mg Oral QHS   montelukast  10 mg Oral Daily   nicotine  21 mg Transdermal Daily   pantoprazole  40 mg Oral BID   rosuvastatin  40 mg Oral Daily   senna-docusate  1 tablet Oral QHS   sodium chloride flush  10 mL Intrapleural Q8H   Continuous Infusions:    LOS: 8 days   Marinda Elk  [facility]  08/30/2022, 7:55 AM

## 2022-08-30 NOTE — Progress Notes (Signed)
      [address]       [place] [address]             [phone]      CXR reviewed... shows some improvement in previous pneumothorax.  Chest tube unclamped no rush of air present.   Will d/c chest tube.  Patient to stay in hospital overnight, will add Mucinex, encouraged deep breathing.. Repeat CXR in AM  Traveion Ruddock, PA-C 2:24 PM 08/30/22

## 2022-08-31 ENCOUNTER — Other Ambulatory Visit (HOSPITAL_COMMUNITY): Payer: Self-pay

## 2022-08-31 ENCOUNTER — Inpatient Hospital Stay (HOSPITAL_COMMUNITY): Payer: Medicare HMO

## 2022-08-31 DIAGNOSIS — J9383 Other pneumothorax: Secondary | ICD-10-CM | POA: Diagnosis not present

## 2022-08-31 LAB — GLUCOSE, CAPILLARY
Glucose-Capillary: 135 mg/dL — ABNORMAL HIGH (ref 70–99)
Glucose-Capillary: 132 mg/dL — ABNORMAL HIGH (ref 70–99)

## 2022-08-31 MED ORDER — APIXABAN 5 MG PO TABS
5.0000 mg | ORAL_TABLET | Freq: Two times a day (BID) | ORAL | 0 refills | Status: DC
  Filled 2022-08-31: qty 60, 30d supply, fill #0

## 2022-08-31 NOTE — Plan of Care (Signed)
  Problem: Education: Goal: Knowledge of General Education information will improve Description: Including pain rating scale, medication(s)/side effects and non-pharmacologic comfort measures Outcome: Adequate for Discharge   Problem: Health Behavior/Discharge Planning: Goal: Ability to manage health-related needs will improve Outcome: Adequate for Discharge   Problem: Clinical Measurements: Goal: Ability to maintain clinical measurements within normal limits will improve Outcome: Adequate for Discharge Goal: Will remain free from infection Outcome: Adequate for Discharge Goal: Diagnostic test results will improve Outcome: Adequate for Discharge Goal: Respiratory complications will improve Outcome: Adequate for Discharge Goal: Cardiovascular complication will be avoided Outcome: Adequate for Discharge   Problem: Activity: Goal: Risk for activity intolerance will decrease Outcome: Adequate for Discharge   Problem: Nutrition: Goal: Adequate nutrition will be maintained Outcome: Adequate for Discharge   Problem: Coping: Goal: Level of anxiety will decrease Outcome: Adequate for Discharge   Problem: Elimination: Goal: Will not experience complications related to bowel motility Outcome: Adequate for Discharge Goal: Will not experience complications related to urinary retention Outcome: Adequate for Discharge   Problem: Pain Managment: Goal: General experience of comfort will improve Outcome: Adequate for Discharge   Problem: Safety: Goal: Ability to remain free from injury will improve Outcome: Adequate for Discharge   Problem: Skin Integrity: Goal: Risk for impaired skin integrity will decrease Outcome: Adequate for Discharge   Problem: Education: Goal: Knowledge of risk factors and measures for prevention of condition will improve Outcome: Adequate for Discharge   Problem: Coping: Goal: Psychosocial and spiritual needs will be supported Outcome: Adequate for  Discharge   Problem: Respiratory: Goal: Will maintain a patent airway Outcome: Adequate for Discharge Goal: Complications related to the disease process, condition or treatment will be avoided or minimized Outcome: Adequate for Discharge   Problem: Education: Goal: Knowledge of disease or condition will improve Outcome: Adequate for Discharge Goal: Knowledge of the prescribed therapeutic regimen will improve Outcome: Adequate for Discharge   Problem: Activity: Goal: Risk for activity intolerance will decrease Outcome: Adequate for Discharge   Problem: Cardiac: Goal: Will achieve and/or maintain hemodynamic stability Outcome: Adequate for Discharge   Problem: Clinical Measurements: Goal: Postoperative complications will be avoided or minimized Outcome: Adequate for Discharge   Problem: Respiratory: Goal: Respiratory status will improve Outcome: Adequate for Discharge   Problem: Pain Management: Goal: Pain level will decrease Outcome: Adequate for Discharge   Problem: Skin Integrity: Goal: Wound healing without signs and symptoms infection will improve Outcome: Adequate for Discharge   Problem: Acute Rehab PT Goals(only PT should resolve) Goal: Pt Will Go Supine/Side To Sit Outcome: Adequate for Discharge Goal: Pt Will Go Sit To Supine/Side Outcome: Adequate for Discharge Goal: Patient Will Transfer Sit To/From Stand Outcome: Adequate for Discharge Goal: Pt Will Ambulate Outcome: Adequate for Discharge Goal: Pt Will Go Up/Down Stairs Outcome: Adequate for Discharge

## 2022-08-31 NOTE — Progress Notes (Addendum)
      [address]       [place] [address]             [phone]      4 Days Post-Op Procedure(s) (LRB): XI ROBOTIC ASSISTED THORACOSCOPY LEFT WEDGE RESECTION WITH PLEURECTOMY, MECHANICAL PLEURADESIS (Left) INTERCOSTAL NERVE BLOCK  Subjective:  Patient wants to go home.  Pain much improved after chest tube removal.  Had large BM yesterday  Objective: Vital signs in last 24 hours: Temp:  [98 F (36.7 C)-98.6 F (37 C)] 98 F (36.7 C) (06/07 0306) Pulse Rate:  [70-83] 70 (06/07 0306) Cardiac Rhythm: Normal sinus rhythm (06/07 0704) Resp:  [18-22] 20 (06/07 0306) BP: (135-149)/(64-86) 139/73 (06/07 0306) SpO2:  [92 %-95 %] 93 % (06/07 0306)  Intake/Output from previous day: 06/06 0701 - 06/07 0700 In: -  Out: 30 [Chest Tube:30]  General appearance: alert, cooperative, and no distress Heart: regular rate and rhythm Lungs: clear to auscultation bilaterally Wound: clean and dry  Lab Results: Recent Labs    08/29/22 0029  WBC 14.5*  HGB 12.4*  HCT 36.5*  PLT 270   BMET:  Recent Labs    08/29/22 0029  NA 135  K 3.8  CL 102  CO2 26  GLUCOSE 134*  BUN 18  CREATININE 1.01  CALCIUM 8.8*    PT/INR: No results for input(s): "LABPROT", "INR" in the last 72 hours. ABG    Component Value Date/Time   PHART 7.46 (H) 08/27/2022 0553   HCO3 26.3 08/27/2022 0553   O2SAT 90.8 08/27/2022 0553   CBG (last 3)  Recent Labs    08/30/22 1559 08/30/22 2103 08/31/22 0626  GLUCAP 124* 136* 135*    Assessment/Plan: S/P Procedure(s) (LRB): XI ROBOTIC ASSISTED THORACOSCOPY LEFT WEDGE RESECTION WITH PLEURECTOMY, MECHANICAL PLEURADESIS (Left) INTERCOSTAL NERVE BLOCK  Spontaneous pneumothorax, S/P Robotic VATS with bleb resection, pleurectomy, pleurodesis.. CT removed yesterday, CXR today shows apical pneumothorax, as discussed with Dr. Cliffton Asters he feels patient likely sucked in air with chest tube removal.  He feels that the patient is safe for  discharge home from our standpoint.  Will sign off, follow up appointments are in the chart   LOS: 9 days    Lowella Dandy, PA-C 08/31/2022

## 2022-08-31 NOTE — Discharge Summary (Addendum)
Physician Discharge Summary  SHEPHERD DOUBLEDAY NWG:[id number] DOB: 28-Sep-1945 DOA: 08/22/2022  PCP: [name], MD  Admit date: 08/22/2022 Discharge date: 08/31/2022  Admitted From: Home Disposition: Home  Recommendations for Outpatient Follow-up:  Follow up with PCP in 1 week Repeat CBC in 1 week Follow-up with Dr. Cliffton Asters as a scheduled Continue spirometry Recommend tobacco cessation Start Eliquis 5 mg twice daily Follow-up with cardiology for cardiovascular workup  Home Health: None Equipment/Devices: None Discharge Condition: Stable CODE STATUS: Full code Diet recommendation: Heart healthy diet  Brief/Interim Summary: Large left spontaneous pneumothorax: -CT surgery was consulted and patient underwent robotic assisted thoracoscopy and underwent left upper lobe wedge resection and apical pleurectomy and mechanical pleurodesis with nerve block on 08/27/2022. -Chest tube removed on 08/30/2022.  Chest x-ray from today 6/7 showed a apical pneumothorax.  Discussed with CT surgery (Dr. Cliffton Asters via secure chat) he thinks patient likely sucked in the air with chest tube removal.  Safe to discharge from CT surgery standpoint.  Recommend to follow-up outpatient -Patient remained stable on room air.  Remained afebrile.   New onset a flutter: -New on 08/25/2022 started on heparin and Cardizem drip. -2D echo showed preserved EF. -Cardiology was consulted, he eventually converted to sinus rhythm on 08/27/2022 -Will discharge on Eliquis 5 mg twice daily.  Recommend to lower up with cardiology outpatient for cardiovascular workup.   Diabetes mellitus type 2: -A1c of 6.6.  Diet controlled  COPD: -Continued inhalers.   Tobacco abuse: -Recommend cessation   History of CAD: -Continued statin, aspirin is on hold. -CT surgery to dictate when to restart aspirin.   Hyperlipidemia:  -continued statins.   GERD: -Continued PPI.   Essential hypertension: -Resumed home  medications.  Constipation -mag citrate given.  Discharge Diagnoses:  left spontaneous pneumothorax New onset atrial flutter Type 2 diabetes COPD Tobacco abuse History of coronary artery disease Hyperlipidemia GERD Hypertension Constipation    Discharge Instructions  Discharge Instructions     Ambulatory Referral for Lung Cancer Scre   Complete by: As directed       Allergies as of 08/31/2022       Reactions   Ppd [tuberculin Purified Protein Derivative]    Positive PPD.        Medication List     TAKE these medications    Accu-Chek Aviva Plus w/Device Kit Check blood sugar 1 time daily-DX-R73.09   Accu-Chek Aviva Soln Use per box instruction.   Accu-Chek Softclix Lancets lancets Check blood sugar 1 time daily-DX-R73.09   albuterol 108 (90 Base) MCG/ACT inhaler Commonly known as: Ventolin HFA 2 inhalations 15 minutes apart every 4 hours to Rescue Asthma   apixaban 5 MG Tabs tablet Commonly known as: Eliquis Take 1 tablet (5 mg total) by mouth 2 (two) times daily.   aspirin 81 MG tablet Take 81 mg by mouth daily.   B-D SINGLE USE SWABS REGULAR Pads Check blood sugar 1 time daily-DX-R73.09.   blood glucose meter kit and supplies Kit Test blood sugar daily or as directed   cetirizine 10 MG tablet Commonly known as: ZyrTEC Allergy Take one tablet nightly for allergies.   diltiazem 240 MG 24 hr capsule Commonly known as: DILACOR XR TAKE 1 CAPSULE DAILY FOR BLOOD PRESSURE AND HEART RHYTHM   famotidine 40 MG tablet Commonly known as: PEPCID Take 1 tablet (40 mg total) by mouth 2 (two) times daily.   fluticasone 50 MCG/ACT nasal spray Commonly known as: FLONASE SPRAY 2 SPRAYS INTO EACH NOSTRIL EVERY DAY What  changed: additional instructions   glucose blood test strip Commonly known as: Accu-Chek Aviva Plus Check blood sugar 1 time daily-R73.09.   ipratropium-albuterol 0.5-2.5 (3) MG/3ML Soln Commonly known as: DUONEB USE 1 VIAL IN  NEBULIZER EVERY 4 TO 6 HOURS - As Needed   losartan 100 MG tablet Commonly known as: COZAAR TAKE 1 TABLET EVERY DAY FOR BLOOD PRESSURE What changed: See the new instructions.   magnesium gluconate 500 MG tablet Commonly known as: MAGONATE Take 500 mg by mouth daily.   metoCLOPramide 10 MG tablet Commonly known as: REGLAN Take  1 tablet  3 x/ day  with Lunch, Supper & Bedtime for Acid Indigestion & Reflux.   montelukast 10 MG tablet Commonly known as: SINGULAIR Take  1 tablet Daily for Allergies                                /                                                                   TAKE                                         BY                                                 MOUTH What changed:  how much to take how to take this when to take this additional instructions   pantoprazole 40 MG tablet Commonly known as: PROTONIX Take  1 tablet  2 x /day  to Prevent Indigestion & Heartburn   rosuvastatin 40 MG tablet Commonly known as: CRESTOR TAKE 1 TABLET DAILY FOR CHOLESTEROL   Trelegy Ellipta 200-62.5-25 MCG/ACT Aepb Generic drug: Fluticasone-Umeclidin-Vilant Use  1 Inhalation  Daily  for COPD /Asthma   Vitamin D 50 MCG (2000 UT) Caps Take 2 capsules by mouth daily.   zinc gluconate 50 MG tablet Take 50 mg by mouth daily.        Follow-up Information     [name], MD Follow up on 09/21/2022.   Specialty: Cardiothoracic Surgery Why: Appointment is at 1:00 Contact information: [address] [place] [address] [phone]         [name], MD. Schedule an appointment as soon as possible for a visit in 1 week(s).   Specialty: Internal Medicine Contact information: [address] [place] [address] [phone]                Allergies  Allergen Reactions   Ppd [Tuberculin Purified Protein Derivative]     Positive PPD.    Consultations: Cardiology Cardiothoracic  surgery   Procedures/Studies: DG Chest 2 View  Result Date: 08/31/2022 CLINICAL DATA:  Chest tube removal. EXAM: CHEST - 2 VIEW COMPARISON:  08/30/2022, 08/26/2022 FINDINGS: Interval removal of left apical chest tube. Moderate left apical pneumothorax. 5.5 cm from left apex to the border of the collapsed lung. Postsurgical changes  over the left upper lobe/apex. Linear density over the left base likely atelectasis. Subtle hazy bilateral prominence of the perihilar vessels suggesting mild vascular congestion. Possible tiny amount left pleural fluid. Cardiomediastinal silhouette and remainder of the exam is unchanged. Mild wedge compression deformity over the midthoracic spine unchanged. IMPRESSION: 1. Interval removal of left apical chest tube with moderate left apical pneumothorax. 2. Mild vascular congestion with possible tiny amount left pleural fluid. 3. Left basilar atelectasis. Electronically Signed   By: Elberta Fortis M.D.   On: 08/31/2022 10:04   DG CHEST PORT 1 VIEW  Result Date: 08/30/2022 CLINICAL DATA:  Pneumothorax.  Chest tube follow-up. EXAM: PORTABLE CHEST 1 VIEW COMPARISON:  08/30/2022 FINDINGS: Chest tube is in place, tip overlying the LEFT lung apex. There is persistent LEFT apical pneumothorax, similar to prior. Surgical clips overlie the LEFT lung apex. There is atelectasis at both lung bases, similar to prior. Asymmetric opacity in the LEFT UPPER lobe appears stable. IMPRESSION: 1. Stable small LEFT apical pneumothorax. 2. Minimal bibasilar atelectasis. Electronically Signed   By: Norva Pavlov M.D.   On: 08/30/2022 16:00   DG CHEST PORT 1 VIEW  Result Date: 08/30/2022 CLINICAL DATA:  Status post left lobectomy. EXAM: PORTABLE CHEST 1 VIEW COMPARISON:  August 29, 2022. FINDINGS: Stable cardiomediastinal silhouette. Stable left-sided chest tube is noted with small left apical pneumothorax which is slightly increased compared to prior exam. Stable left basilar atelectasis is noted with  small left pleural effusion. Minimal right basilar subsegmental atelectasis is noted. Bony thorax is unremarkable. IMPRESSION: Stable left-sided chest tube is noted with small left apical pneumothorax which is slightly increased compared to prior exam. Stable bibasilar atelectasis with small left pleural effusion. Electronically Signed   By: Lupita Raider M.D.   On: 08/30/2022 11:20   DG CHEST PORT 1 VIEW  Result Date: 08/29/2022 CLINICAL DATA:  Status post lung surgery, follow-up exam. EXAM: PORTABLE CHEST 1 VIEW COMPARISON:  Chest x-ray dated 08/28/2022. FINDINGS: Stable postsurgical changes of the LEFT hemithorax. LEFT-sided chest tube is stable in position with tip directed towards the LEFT lung apex. No evidence of pneumothorax. Ill-defined opacity at the LEFT lung base, not significantly changed compared to recent exams, likely atelectasis and/or small pleural effusion. Probable mild atelectasis at the RIGHT lung base. No new lung findings. Heart size and mediastinal contours are stable. IMPRESSION: 1. Stable postsurgical changes of the LEFT hemithorax. LEFT-sided chest tube is stable in position. No evidence of pneumothorax. 2. No new lung findings. 3. Ill-defined opacity at the LEFT lung base, not significantly changed compared to recent exams, likely atelectasis and/or small pleural effusion. Electronically Signed   By: Bary Richard M.D.   On: 08/29/2022 10:28   DG Chest Port 1 View  Result Date: 08/28/2022 CLINICAL DATA:  Status post surgery EXAM: PORTABLE CHEST 1 VIEW COMPARISON:  Chest x-ray dated August 27, 2022 FINDINGS: Cardiac and mediastinal contours are unchanged. Stable postsurgical changes of the left hemithorax. Left-sided chest tube in place. Mild bibasilar opacities, likely due to atelectasis. No evidence of pleural effusion. No visible pneumothorax. IMPRESSION: 1. Bibasilar atelectasis. 2. Left-sided chest tube in place. No visible pneumothorax. Electronically Signed   By: Allegra Lai M.D.   On: 08/28/2022 10:51   DG Chest Port 1 View  Result Date: 08/27/2022 CLINICAL DATA:  Status post lung surgery. EXAM: PORTABLE CHEST 1 VIEW COMPARISON:  08/26/2022. FINDINGS: Interval left apical surgical staple lines. Interval left chest tube with its tip at the left  lung apex. There may be an approximately 5% left apical pneumothorax surrounding the chest tube. No pneumothorax visualized elsewhere in the left hemithorax were on the right. Mild linear atelectasis at the right lung base. Improved atelectasis at the left lung base. Normal sized heart. Calcified thoracic aortic arch. Biapical pleural and parenchymal scarring. Thoracic spine degenerative changes. IMPRESSION: 1. Probable approximately 5% left apical pneumothorax surrounding the left chest tube. 2. Improved left basilar atelectasis. 3. Mild right basilar atelectasis. These results will be called to the ordering clinician or representative by the Radiologist Assistant, and communication documented in the PACS or [employer]. Electronically Signed   By: Beckie Salts M.D.   On: 08/27/2022 15:45   DG Chest 2 View  Result Date: 08/26/2022 CLINICAL DATA:  Pneumothorax, chest tube in place EXAM: CHEST - 2 VIEW COMPARISON:  08/25/2022 FINDINGS: Frontal and lateral views of the chest demonstrate stable pigtail drainage catheter within the left anterior lower hemithorax. Cardiac silhouette is unchanged. Persistent bibasilar hypoventilatory changes. There may be trace bilateral pleural effusions, left greater than right, not appreciably changed. No evidence of pneumothorax. No acute bony abnormality. IMPRESSION: 1. Stable indwelling left chest tube. No evidence of residual or recurrent pneumothorax. 2. Bibasilar hypoventilatory changes and trace bilateral pleural effusions, not appreciably changed since prior exam. Electronically Signed   By: Sharlet Salina M.D.   On: 08/26/2022 19:38   ECHOCARDIOGRAM COMPLETE  Result Date:  08/26/2022    ECHOCARDIOGRAM REPORT   Patient Name:   WAYMON LASER Date of Exam: 08/26/2022 Medical Rec #:  [id number]       Height:       70.0 in Accession #:    [id number]      Weight:       162.0 lb Date of Birth:  Oct 19, 1945      BSA:          1.909 m Patient Age:    77 years        BP:           126/66 mmHg Patient Gender: M               HR:           73 bpm. Exam Location:  Inpatient Procedure: 2D Echo, Color Doppler and Cardiac Doppler Indications:    atrial flutter  History:        Patient has no prior history of Echocardiogram examinations.                 COPD, Arrythmias:bigeminy and PVC; Risk Factors:Hypertension,                 Dyslipidemia and Current Smoker.  Sonographer:    [name] RDCS Referring Phys: [id number] DAWOOD S ELGER[place] IMPRESSIONS  1. Left ventricular ejection fraction, by estimation, is 60 to 65%. The left ventricle has normal function. The left ventricle has no regional wall motion abnormalities. There is mild left ventricular hypertrophy. Left ventricular diastolic parameters are indeterminate.  2. Right ventricular systolic function is normal. The right ventricular size is normal. There is normal pulmonary artery systolic pressure. The estimated right ventricular systolic pressure is 28.8 mmHg.  3. The mitral valve is normal in structure. No evidence of mitral valve regurgitation.  4. The aortic valve is tricuspid. Aortic valve regurgitation is not visualized. Aortic valve sclerosis/calcification is present, without any evidence of aortic stenosis.  5. The inferior vena cava is normal in size with greater than 50% respiratory variability, suggesting  right atrial pressure of 3 mmHg. FINDINGS  Left Ventricle: Left ventricular ejection fraction, by estimation, is 60 to 65%. The left ventricle has normal function. The left ventricle has no regional wall motion abnormalities. The left ventricular internal cavity size was normal in size. There is  mild left ventricular hypertrophy.  Left ventricular diastolic parameters are indeterminate. Right Ventricle: The right ventricular size is normal. No increase in right ventricular wall thickness. Right ventricular systolic function is normal. There is normal pulmonary artery systolic pressure. The tricuspid regurgitant velocity is 2.54 m/s, and  with an assumed right atrial pressure of 3 mmHg, the estimated right ventricular systolic pressure is 28.8 mmHg. Left Atrium: Left atrial size was normal in size. Right Atrium: Right atrial size was normal in size. Pericardium: Trivial pericardial effusion is present. Mitral Valve: The mitral valve is normal in structure. No evidence of mitral valve regurgitation. Tricuspid Valve: The tricuspid valve is normal in structure. Tricuspid valve regurgitation is trivial. Aortic Valve: The aortic valve is tricuspid. Aortic valve regurgitation is not visualized. Aortic valve sclerosis/calcification is present, without any evidence of aortic stenosis. Pulmonic Valve: The pulmonic valve was not well visualized. Pulmonic valve regurgitation is not visualized. Aorta: The aortic root and ascending aorta are structurally normal, with no evidence of dilitation. Venous: The inferior vena cava is normal in size with greater than 50% respiratory variability, suggesting right atrial pressure of 3 mmHg. IAS/Shunts: The interatrial septum was not well visualized.  LEFT VENTRICLE PLAX 2D LVIDd:         4.50 cm   Diastology LVIDs:         3.40 cm   LV e' medial:    6.42 cm/s LV PW:         1.10 cm   LV E/e' medial:  8.6 LV IVS:        1.10 cm   LV e' lateral:   7.72 cm/s LVOT diam:     2.10 cm   LV E/e' lateral: 7.2 LV SV:         49 LV SV Index:   26 LVOT Area:     3.46 cm  RIGHT VENTRICLE             IVC RV Basal diam:  2.60 cm     IVC diam: 1.70 cm RV S prime:     15.40 cm/s TAPSE (M-mode): 1.9 cm LEFT ATRIUM           Index        RIGHT ATRIUM           Index LA diam:      4.00 cm 2.10 cm/m   RA Area:     13.30 cm LA Vol  (A4C): 44.4 ml 23.26 ml/m  RA Volume:   28.50 ml  14.93 ml/m  AORTIC VALVE LVOT Vmax:   79.20 cm/s LVOT Vmean:  51.400 cm/s LVOT VTI:    0.142 m  AORTA Ao Root diam: 3.00 cm Ao Asc diam:  3.10 cm MITRAL VALVE               TRICUSPID VALVE MV Area (PHT): 3.12 cm    TR Peak grad:   25.8 mmHg MV Decel Time: 243 msec    TR Vmax:        254.00 cm/s MV E velocity: 55.50 cm/s MV A velocity: 46.00 cm/s  SHUNTS MV E/A ratio:  1.21        Systemic VTI:  0.14 m  Systemic Diam: 2.10 cm Epifanio Lesches MD Electronically signed by Epifanio Lesches MD Signature Date/Time: 08/26/2022/12:35:35 PM    Final    DG CHEST PORT 1 VIEW  Result Date: 08/25/2022 CLINICAL DATA:  Follow-up left pneumothorax and left chest tube. EXAM: PORTABLE CHEST 1 VIEW COMPARISON:  08/24/2022 and older exams.  CT, 08/22/2022. FINDINGS: Stable left inferior hemithorax pigtail chest tube. No pneumothorax. Left greater than right lung base opacities, consistent with atelectasis, stable from the previous day's exam. Remainder of the lungs is clear. IMPRESSION: 1. No residual pneumothorax. 2. No other change from the previous day's exam. Stable left chest tube. Left greater than right lung base opacities suspected to be atelectasis. Electronically Signed   By: Amie Portland M.D.   On: 08/25/2022 09:49   DG Chest Port 1 View  Result Date: 08/24/2022 CLINICAL DATA:  Follow-up of pneumothorax EXAM: PORTABLE CHEST 1 VIEW COMPARISON:  Chest radiograph dated 08/23/2022 FINDINGS: Lines/tubes: Similar position of left pleural catheter. Lungs: Similar low lung volumes with left-greater-than-right basilar patchy opacities. Pleura: Decreased small left pneumothorax. No right pneumothorax. Trace bilateral pleural effusions. Heart/mediastinum: Similar  cardiomediastinal silhouette. Bones: No acute osseous abnormality. IMPRESSION: 1. Decreased small left pneumothorax. 2. Similar low lung volumes with left-greater-than-right basilar  patchy opacities, likely atelectasis. 3. Trace bilateral pleural effusions. Electronically Signed   By: Agustin Cree M.D.   On: 08/24/2022 10:25   DG Chest Port 1 View  Result Date: 08/23/2022 CLINICAL DATA:  Pneumothorax EXAM: PORTABLE CHEST 1 VIEW COMPARISON:  Chest radiograph dated 08/22/2022 FINDINGS: Lines/tubes: Similar location of left pleural catheter. Lungs: Slightly increased left basilar patchy and linear opacities. Pleura: Similar small to moderate left pneumothorax. Heart/mediastinum: The heart size and mediastinal contours are within normal limits. Bones: No acute osseous abnormality. IMPRESSION: 1. Similar small to moderate left pneumothorax with left pleural catheter in place. 2. Slightly increased left basilar patchy and linear opacities, likely atelectasis. Electronically Signed   By: Agustin Cree M.D.   On: 08/23/2022 15:22   DG Chest Portable 1 View  Result Date: 08/22/2022 CLINICAL DATA:  Pneumothorax status post chest tube placement EXAM: PORTABLE CHEST 1 VIEW COMPARISON:  CTA chest dated 08/22/2022, chest radiograph dated 05/15/2021 FINDINGS: Lines/tubes: Left inferolateral approach pleural catheter tip projects over the left hilum. Lungs: Interval re-expansion of the left lung with patchy left basilar opacities. Pleura: Moderate left pneumothorax. Heart/mediastinum: The heart size and mediastinal contours are within normal limits. Bones: No acute osseous abnormality. Mild left chest wall subcutaneous emphysema. IMPRESSION: 1. Decreased moderate left pneumothorax with left inferolateral approach pleural catheter in place. 2. Interval re-expansion of the left lung with patchy left basilar opacities, likely atelectasis. Electronically Signed   By: Agustin Cree M.D.   On: 08/22/2022 15:55   CT ABDOMEN PELVIS W CONTRAST  Result Date: 08/22/2022 CLINICAL DATA:  Epigastric pressure associated with abdominal distention EXAM: CT ANGIOGRAPHY CHEST CT ABDOMEN AND PELVIS WITH CONTRAST TECHNIQUE:  Multidetector CT imaging of the chest was performed using the standard protocol during bolus administration of intravenous contrast. Multiplanar CT image reconstructions and MIPs were obtained to evaluate the vascular anatomy. Multidetector CT imaging of the abdomen and pelvis was performed using the standard protocol during bolus administration of intravenous contrast. RADIATION DOSE REDUCTION: This exam was performed according to the departmental dose-optimization program which includes automated exposure control, adjustment of the mA and/or kV according to patient size and/or use of iterative reconstruction technique. CONTRAST:  75mL OMNIPAQUE IOHEXOL 350 MG/ML SOLN COMPARISON:  CT chest  dated 05/17/2021, 02/26/2020 FINDINGS: CTA CHEST FINDINGS Cardiovascular: The study is high quality for the evaluation of pulmonary embolism. There are no filling defects in the central, lobar, segmental or subsegmental pulmonary artery branches to suggest acute pulmonary embolism. Great vessels are normal in course and caliber. Mild left atrial enlargement. No significant pericardial fluid/thickening. Coronary artery calcifications. Mediastinum/Nodes: Rightward shift of the mediastinum. Imaged thyroid gland without nodules meeting criteria for imaging follow-up by size. Small hiatal hernia. No pathologically enlarged axillary, supraclavicular, mediastinal, or hilar lymph nodes. Lungs/Pleura: The central airways are patent. Near-complete atelectasis of the left lung. Posterior right apical pleural-parenchymal scarring. Left large left pneumothorax. No pleural effusion. Musculoskeletal: No acute or abnormal lytic or blastic osseous lesions. Multilevel degenerative changes of the thoracic spine. Unchanged subcutaneous density along the paramidline left upper back measures 1.5 x 1.2 cm (5:11), likely an inclusion cyst. Review of the MIP images confirms the above findings. CT ABDOMEN and PELVIS FINDINGS Hepatobiliary: Scattered  subcentimeter hypodensities, too small to characterize but unchanged from 02/26/2020. No intra or extrahepatic biliary ductal dilation. Normal gallbladder. Pancreas: No focal lesions or main ductal dilation. Spleen: Normal in size without focal abnormality. Adrenals/Urinary Tract: No adrenal nodules. No suspicious renal mass, calculi, or hydronephrosis. Bilateral hypodensities measuring up to 1.3 cm, likely simple/minimally complicated cysts. No specific follow-up imaging recommended. No focal bladder wall thickening. Stomach/Bowel: Normal appearance of the stomach. No evidence of bowel wall thickening, distention, or inflammatory changes. Colonic diverticulosis without acute diverticulitis. Normal appendix. Vascular/Lymphatic: Aortic atherosclerosis. No enlarged abdominal or pelvic lymph nodes. Reproductive: Enlargement of the prostate with median lobe hypertrophy. Other: No free fluid, fluid collection, or free air. Musculoskeletal: No acute or abnormal lytic or blastic osseous lesions. 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 Multilevel degenerative changes of the lumbar spine. Age indeterminate superior endplate compression of L1. IMPRESSION: 1. No evidence of pulmonary embolism. 2. Large left pneumothorax with rightward shift of the mediastinum and near-complete atelectasis of the left lung. 3. No acute findings in the abdomen or pelvis. 4. Colonic diverticulosis without acute diverticulitis. 5. Enlargement of the prostate with median lobe hypertrophy. 6.  Aortic Atherosclerosis (ICD10-I70.0). Critical Value/emergent results were called by telephone at the time of interpretation on 08/22/2022 at 3:05 pm to provider Gloris Manchester , who verbally acknowledged these results. Electronically Signed   By: Agustin Cree M.D.   On: 08/22/2022 15:19   CT Angio Chest PE W and/or Wo Contrast  Result Date: 08/22/2022 CLINICAL DATA:  Epigastric pressure associated with abdominal distention EXAM: CT  ANGIOGRAPHY CHEST CT ABDOMEN AND PELVIS WITH CONTRAST TECHNIQUE: Multidetector CT imaging of the chest was performed using the standard protocol during bolus administration of intravenous contrast. Multiplanar CT image reconstructions and MIPs were obtained to evaluate the vascular anatomy. Multidetector CT imaging of the abdomen and pelvis was performed using the standard protocol during bolus administration of intravenous contrast. RADIATION DOSE REDUCTION: This exam was performed according to the departmental dose-optimization program which includes automated exposure control, adjustment of the mA and/or kV according to patient size and/or use of iterative reconstruction technique. CONTRAST:  75mL OMNIPAQUE IOHEXOL 350 MG/ML SOLN COMPARISON:  CT chest dated 05/17/2021, 02/26/2020 FINDINGS: CTA CHEST FINDINGS Cardiovascular: The study is high quality for the evaluation of pulmonary embolism. There are no filling defects in the central, lobar, segmental or subsegmental pulmonary artery branches to suggest acute pulmonary embolism. Great vessels are normal in course and caliber. Mild left atrial enlargement. No significant pericardial fluid/thickening. Coronary artery calcifications.  Mediastinum/Nodes: Rightward shift of the mediastinum. Imaged thyroid gland without nodules meeting criteria for imaging follow-up by size. Small hiatal hernia. No pathologically enlarged axillary, supraclavicular, mediastinal, or hilar lymph nodes. Lungs/Pleura: The central airways are patent. Near-complete atelectasis of the left lung. Posterior right apical pleural-parenchymal scarring. Left large left pneumothorax. No pleural effusion. Musculoskeletal: No acute or abnormal lytic or blastic osseous lesions. Multilevel degenerative changes of the thoracic spine. Unchanged subcutaneous density along the paramidline left upper back measures 1.5 x 1.2 cm (5:11), likely an inclusion cyst. Review of the MIP images confirms the above  findings. CT ABDOMEN and PELVIS FINDINGS Hepatobiliary: Scattered subcentimeter hypodensities, too small to characterize but unchanged from 02/26/2020. No intra or extrahepatic biliary ductal dilation. Normal gallbladder. Pancreas: No focal lesions or main ductal dilation. Spleen: Normal in size without focal abnormality. Adrenals/Urinary Tract: No adrenal nodules. No suspicious renal mass, calculi, or hydronephrosis. Bilateral hypodensities measuring up to 1.3 cm, likely simple/minimally complicated cysts. No specific follow-up imaging recommended. No focal bladder wall thickening. Stomach/Bowel: Normal appearance of the stomach. No evidence of bowel wall thickening, distention, or inflammatory changes. Colonic diverticulosis without acute diverticulitis. Normal appendix. Vascular/Lymphatic: Aortic atherosclerosis. No enlarged abdominal or pelvic lymph nodes. Reproductive: Enlargement of the prostate with median lobe hypertrophy. Other: No free fluid, fluid collection, or free air. Musculoskeletal: No acute or abnormal lytic or blastic osseous lesions. 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 Multilevel degenerative changes of the lumbar spine. Age indeterminate superior endplate compression of L1. IMPRESSION: 1. No evidence of pulmonary embolism. 2. Large left pneumothorax with rightward shift of the mediastinum and near-complete atelectasis of the left lung. 3. No acute findings in the abdomen or pelvis. 4. Colonic diverticulosis without acute diverticulitis. 5. Enlargement of the prostate with median lobe hypertrophy. 6.  Aortic Atherosclerosis (ICD10-I70.0). Critical Value/emergent results were called by telephone at the time of interpretation on 08/22/2022 at 3:05 pm to provider Gloris Manchester , who verbally acknowledged these results. Electronically Signed   By: Agustin Cree M.D.   On: 08/22/2022 15:19      Subjective: Patient seen and examined.  Sitting comfortably on recliner.  On room  air.  Denies chest pain, shortness of breath, palpitations, fever, chills.  Wishes to go home today.  Discharge Exam: Vitals:   08/31/22 0754 08/31/22 0905  BP: 139/60   Pulse: 78   Resp: 20   Temp: 98.4 F (36.9 C)   SpO2: 93% 93%   Vitals:   08/30/22 2300 08/31/22 0306 08/31/22 0754 08/31/22 0905  BP: 139/70 139/73 139/60   Pulse: 78 70 78   Resp: 18 20 20    Temp: 98.3 F (36.8 C) 98 F (36.7 C) 98.4 F (36.9 C)   TempSrc: Oral Oral Oral   SpO2: 94% 93% 93% 93%  Weight:      Height:        General: Pt is alert, awake, not in acute distress, on room air, communicating well Cardiovascular: RRR, S1/S2 +, no rubs, no gallops Respiratory: CTA bilaterally, no wheezing, no rhonchi Abdominal: Soft, NT, ND, bowel sounds + Extremities: no edema, no cyanosis    The results of significant diagnostics from this hospitalization (including imaging, microbiology, ancillary and laboratory) are listed below for reference.     Microbiology: Recent Results (from the past 240 hour(s))  SARS Coronavirus 2 by RT PCR (hospital order, performed in [facility] hospital lab) *cepheid single result test* Anterior Nasal Swab     Status: None   Collection  Time: 08/26/22  6:32 PM   Specimen: Anterior Nasal Swab  Result Value Ref Range Status   SARS Coronavirus 2 by RT PCR NEGATIVE NEGATIVE Final    Comment: Performed at [facility], [address] [place], [place] [address]  Surgical pcr screen     Status: None   Collection Time: 08/27/22  7:21 AM   Specimen: Nasal Mucosa; Nasal Swab  Result Value Ref Range Status   MRSA, PCR NEGATIVE NEGATIVE Final   Staphylococcus aureus NEGATIVE NEGATIVE Final    Comment: (NOTE) The Xpert SA Assay (FDA approved for NASAL specimens in patients 57 years of age and older), is one component of a comprehensive surveillance program. It is not intended to diagnose infection nor to guide or monitor treatment. Performed at [facility], [address], [place], [place] [address]      Labs: BNP (last 3 results) No results for input(s): "BNP" in the last 8760 hours. Basic Metabolic Panel: Recent Labs  Lab 08/25/22 1530 08/26/22 0324 08/28/22 0031 08/29/22 0029  NA 135 135 134* 135  K 4.1 4.0 4.3 3.8  CL 98 99 99 102  CO2 25 24 27 26   GLUCOSE 142* 144* 201* 134*  BUN 12 13 14 18   CREATININE 1.01 0.92 1.12 1.01  CALCIUM 9.9 8.9 8.9 8.8*  MG 2.3  --   --  2.4   Liver Function Tests: Recent Labs  Lab 08/29/22 0029  AST 18  ALT 15  ALKPHOS 61  BILITOT 0.4  PROT 6.4*  ALBUMIN 3.1*   No results for input(s): "LIPASE", "AMYLASE" in the last 168 hours. No results for input(s): "AMMONIA" in the last 168 hours. CBC: Recent Labs  Lab 08/25/22 1530 08/26/22 0324 08/27/22 0537 08/28/22 0031 08/29/22 0029  WBC 16.5* 9.1 10.5 15.3* 14.5*  HGB 14.5 13.7 13.1 12.5* 12.4*  HCT 43.3 41.5 39.1 37.2* 36.5*  MCV 95.4 95.8 94.7 94.7 93.6  PLT 272 224 252 237 270   Cardiac Enzymes: No results for input(s): "CKTOTAL", "CKMB", "CKMBINDEX", "TROPONINI" in the last 168 hours. BNP: Invalid input(s): "POCBNP" CBG: Recent Labs  Lab 08/30/22 0621 08/30/22 1126 08/30/22 1559 08/30/22 2103 08/31/22 0626  GLUCAP 116* 195* 124* 136* 135*   D-Dimer No results for input(s): "DDIMER" in the last 72 hours. Hgb A1c No results for input(s): "HGBA1C" in the last 72 hours. Lipid Profile No results for input(s): "CHOL", "HDL", "LDLCALC", "TRIG", "CHOLHDL", "LDLDIRECT" in the last 72 hours. Thyroid function studies No results for input(s): "TSH", "T4TOTAL", "T3FREE", "THYROIDAB" in the last 72 hours.  Invalid input(s): "FREET3" Anemia work up No results for input(s): "VITAMINB12", "FOLATE", "FERRITIN", "TIBC", "IRON", "RETICCTPCT" in the last 72 hours. Urinalysis    Component Value Date/Time   COLORURINE YELLOW 08/26/2022 1953   APPEARANCEUR CLEAR 08/26/2022 1953   LABSPEC 1.018 08/26/2022 1953   PHURINE 6.0 08/26/2022 1953    GLUCOSEU NEGATIVE 08/26/2022 1953   HGBUR NEGATIVE 08/26/2022 1953   BILIRUBINUR NEGATIVE 08/26/2022 1953   KETONESUR NEGATIVE 08/26/2022 1953   PROTEINUR NEGATIVE 08/26/2022 1953   NITRITE NEGATIVE 08/26/2022 1953   LEUKOCYTESUR NEGATIVE 08/26/2022 1953   Sepsis Labs Recent Labs  Lab 08/26/22 0324 08/27/22 0537 08/28/22 0031 08/29/22 0029  WBC 9.1 10.5 15.3* 14.5*   Microbiology Recent Results (from the past 240 hour(s))  SARS Coronavirus 2 by RT PCR (hospital order, performed in [facility] hospital lab) *cepheid single result test* Anterior Nasal Swab     Status: None  Collection Time: 08/26/22  6:32 PM   Specimen: Anterior Nasal Swab  Result Value Ref Range Status   SARS Coronavirus 2 by RT PCR NEGATIVE NEGATIVE Final    Comment: Performed at [facility], [address] [place], [place] [address]  Surgical pcr screen     Status: None   Collection Time: 08/27/22  7:21 AM   Specimen: Nasal Mucosa; Nasal Swab  Result Value Ref Range Status   MRSA, PCR NEGATIVE NEGATIVE Final   Staphylococcus aureus NEGATIVE NEGATIVE Final    Comment: (NOTE) The Xpert SA Assay (FDA approved for NASAL specimens in patients 48 years of age and older), is one component of a comprehensive surveillance program. It is not intended to diagnose infection nor to guide or monitor treatment. Performed at [facility], [address], [place], [place] [address]      Time coordinating discharge: Over 30 minutes  SIGNED:   Ollen Bowl, MD  [facility] 08/31/2022, 10:10 AM Pager   If 7PM-7AM, please contact night-coverage [email or web address]

## 2022-08-31 NOTE — TOC Transition Note (Signed)
Transition of Care (TOC) - CM/SW Discharge Note Donn Pierini RN, BSN Transitions of Care Unit 4E- RN Case Manager See Treatment Team for direct phone # [phone] cross coverage  Patient Details  Name: ELIGE SHOUSE MRN: [id number] Date of Birth: Sep 22, 1945  Transition of Care [facility]) CM/SW Contact:  Darrold Span, RN Phone Number: 08/31/2022, 12:41 PM   Clinical Narrative:    Pt stable for transition home today, Chest Tube has been removed. No TOC needs noted- pt has transportation home.    Final next level of care: Home/Self Care Barriers to Discharge: No Barriers Identified   Patient Goals and CMS Choice   Choice offered to / list presented to : NA  Discharge Placement                 Home        Discharge Plan and Services Additional resources added to the After Visit Summary for   In-house Referral: NA Discharge Planning Services: CM Consult Post Acute Care Choice: NA          DME Arranged: N/A DME Agency: NA       HH Arranged: NA HH Agency: NA        Social Determinants of Health (SDOH) Interventions SDOH Screenings   Food Insecurity: No Food Insecurity (08/22/2022)  Housing: Low Risk  (08/22/2022)  Transportation Needs: No Transportation Needs (08/22/2022)  Utilities: Not At Risk (08/22/2022)  Depression (PHQ2-9): Low Risk  (04/10/2022)  Tobacco Use: Medium Risk (08/28/2022)     Readmission Risk Interventions    08/31/2022   12:41 PM  Readmission Risk Prevention Plan  Transportation Screening Complete  Home Care Screening Complete  Medication Review (RN CM) Complete

## 2022-08-31 NOTE — Progress Notes (Signed)
Mobility Specialist Progress Note:    08/31/22 1019  Mobility  Activity Ambulated independently in hallway  Level of Assistance Standby assist, set-up cues, supervision of patient - no hands on  Assistive Device None  Distance Ambulated (ft) 288 ft  Activity Response Tolerated well  Mobility Referral Yes  $Mobility charge 1 Mobility  Mobility Specialist Start Time (ACUTE ONLY) 0949  Mobility Specialist Stop Time (ACUTE ONLY) 1000  Mobility Specialist Time Calculation (min) (ACUTE ONLY) 11 min   Received pt in chair having no complaints and agreeable to mobility. Pt was asymptomatic throughout ambulation and returned to room w/o fault. Left in chair w/ call bell in reach and all needs met.   Thompson Grayer Mobility Specialist  Please contact vis Secure Chat or  Rehab Office [phone]

## 2022-09-04 NOTE — Progress Notes (Unsigned)
[facility]  Future Appointments  Date Time Provider Department  09/05/2022 11:00 AM [name], MD [facility]  09/21/2022  1:00 PM [name], MD T[facility]  10/01/2022 11:20 AM Jodelle Gross, NP [facility]  10/04/2022 10:20 AM Myrtie Neither, Andreas Blower, MD L[facility]I-GI  01/11/2023 11:00 AM [name], MD [facility]  05/15/2023 11:00 AM Adela Glimpse, NP [facility]        This very nice 77 y.o.  MWM  was admitted to the hospital on  08/22/2022  and patient was discharged from the hospital on  08/31/2022   . The patient now presents for follow up for transition from recent hospitalization .  The day after discharge  our clinical staff contacted the patient to assure stability and schedule a follow up appointment. The discharge summary, medications and diagnostic test results were reviewed before meeting with the patient. The patient was admitted for:   1. Spontaneous pneumothorax   2. Atrial flutter, paroxysmal (HCC) 3. Type 2 diabetes mellitus with stage 2 chronic kidney                                 disease, without long-term current use of insulin (HCC) 4. Chronic obstructive pulmonary disease, unspecified COPD type (HCC) 5. Tobacco abuse 6. History of CAD (coronary artery disease) 7. Hyperlipidemia associated with type 2 diabetes mellitus (HCC) 8. Essential hypertension     Hospitalization discharge instructions and medications are reconciled with the patient.      Patient presented to the ER with Abdominal pain & was found to gha spontaneous Lt PTX. Chest Tube was placed and patient was admitted.  Patient was seen in consultation by Thoracic Surgeon, Dr Cliffton Asters & scheduled for VATS. Patient was found in At Flutter & seen by Dr Antoine Poche and patient converted on iv Cardizem & started on heparin & later discharged on Eliquis.  On 6/3 patient  underwent robotic assisted thoracoscopy and left upper lobe wedge resection and apical pleurectomy and  mechanical pleurodesis . Chest tube removed on 08/30/2022.           Patient is also followed with Hypertension, Hyperlipidemia, Pre-Diabetes and Vitamin D Deficiency.      Patient is treated for HTN & BP has been controlled at home. Today's  . Patient has had no complaints of any cardiac type chest pain, palpitations, dyspnea/orthopnea/PND, dizziness, claudication, or dependent edema.      Hyperlipidemia is controlled with diet & meds. Patient denies myalgias or other med SE's. Last Lipids were at goal except elevated Trig's :  Lab Results  Component Value Date   CHOL 151 07/18/2022   HDL 41 07/18/2022   LDLCALC 77 07/18/2022   TRIG 240 (H) 07/18/2022   CHOLHDL 3.7 07/18/2022       Also, the patient has history of T2_NIDDM and has had no symptoms of reactive hypoglycemia, diabetic polys, paresthesias or visual blurring.  Last A1c was not at goal :  Lab Results  Component Value Date   HGBA1C 6.6 (H) 07/18/2022       Further, the patient also has history of Vitamin D Deficiency and supplements vitamin D without any suspected side-effects. Last vitamin D was  at goal :  Lab Results  Component Value Date   VD25OH 70 07/18/2022   Current Outpatient Medications on File Prior to Visit  Medication Sig   ACCU-CHEK SOFTCLIX LANCETS lancets Check blood sugar 1 time  daily-DX-R73.09   albuterol (VENTOLIN HFA) 108 (90 Base) MCG/ACT inhaler 2 inhalations 15 minutes apart every 4 hours to Rescue Asthma   Alcohol Swabs (B-D SINGLE USE SWABS REGULAR) PADS Check blood sugar 1 time daily-DX-R73.09.   apixaban (ELIQUIS) 5 MG TABS tablet Take 1 tablet (5 mg total) by mouth 2 (two) times daily.   aspirin 81 MG tablet Take 81 mg by mouth daily.   Blood Glucose Calibration (ACCU-CHEK AVIVA) SOLN Use per box instruction.   blood glucose meter kit and supplies KIT Test blood sugar daily or as directed   Blood Glucose Monitoring Suppl (ACCU-CHEK AVIVA PLUS) w/Device KIT Check blood sugar 1 time  daily-DX-R73.09   cetirizine (ZYRTEC ALLERGY) 10 MG tablet Take one tablet nightly for allergies.   Cholecalciferol (VITAMIN D) 2000 UNITS CAPS Take 2 capsules by mouth daily.    diltiazem (DILACOR XR) 240 MG 24 hr capsule TAKE 1 CAPSULE DAILY FOR BLOOD PRESSURE AND HEART RHYTHM   famotidine (PEPCID) 40 MG tablet Take 1 tablet (40 mg total) by mouth 2 (two) times daily.   fluticasone (FLONASE) 50 MCG/ACT nasal spray SPRAY 2 SPRAYS INTO EACH NOSTRIL EVERY DAY   Fluticasone-Umeclidin-Vilant (TRELEGY ELLIPTA) 200-62.5-25 MCG/ACT AEPB Use  1 Inhalation  Daily  for COPD /Asthma   glucose blood (ACCU-CHEK AVIVA PLUS) test strip Check blood sugar 1 time daily-R73.09.   ipratropium-albuterol (DUONEB) 0.5-2.5 (3) MG/3ML SOLN USE 1 VIAL IN NEBULIZER EVERY 4 TO 6 HOURS - As Needed   losartan (COZAAR) 100 MG tablet TAKE 1 TABLET EVERY DAY FOR BLOOD PRESSURE (Patient taking differently: Take 100 mg by mouth daily. TAKE 1 TABLET EVERY DAY FOR BLOOD PRESSURE)   magnesium gluconate (MAGONATE) 500 MG tablet Take 500 mg by mouth daily.    metoCLOPramide (REGLAN) 10 MG tablet Take  1 tablet  3 x/ day  with Lunch, Supper & Bedtime for Acid Indigestion & Reflux.   montelukast (SINGULAIR) 10 MG tablet Take  1 tablet Daily for Allergies                                /                                                                   TAKE                                         BY                                                 MOUTH   pantoprazole (PROTONIX) 40 MG tablet Take  1 tablet  2 x /day  to Prevent Indigestion & Heartburn   rosuvastatin (CRESTOR) 40 MG tablet TAKE 1 TABLET DAILY FOR CHOLESTEROL   zinc gluconate 50 MG tablet Take 50 mg by mouth daily.   No current facility-administered medications on file prior to visit.   Allergies  Allergen Reactions   Ppd [Tuberculin Purified Protein  Derivative]     Positive PPD.   PMHx:   Past Medical History:  Diagnosis Date   Diabetes mellitus without complication  (HCC)    Hyperlipidemia    Hypertension    Vitamin D deficiency    Immunization History  Administered Date(s) Administered   DTaP 05/23/2006   Influenza Whole 12/16/2012   Influenza, High Dose Seasonal PF 01/17/2015, 12/13/2015, 01/29/2017, 12/17/2017, 01/14/2019, 01/27/2020, 01/24/2021, 01/08/2022   Influenza-Unspecified 12/27/2013   PFIZER(Purple Top)SARS-COV-2 Vaccination 05/18/2019, 06/08/2019   Pneumococcal Conjugate-13 04/13/2014   Pneumococcal Polysaccharide-23 09/13/2008, 08/06/2016   Td 08/06/2016   Zoster, Live 04/17/2013   Past Surgical History:  Procedure Laterality Date   CATARACT EXTRACTION, BILATERAL Bilateral 2018   Dr. Wayland Denis   INTERCOSTAL NERVE BLOCK  08/27/2022   Procedure: INTERCOSTAL NERVE BLOCK;  Surgeon: [name], MD;  Location: [facility] OR;  Service: Thoracic;;   FHx:    Reviewed / unchanged  SHx:    Reviewed / unchanged  Systems Review:  Constitutional: Denies fever, chills, wt changes, headaches, insomnia, fatigue, night sweats, change in appetite. Eyes: Denies redness, blurred vision, diplopia, discharge, itchy, watery eyes.  ENT: Denies discharge, congestion, post nasal drip, epistaxis, sore throat, earache, hearing loss, dental pain, tinnitus, vertigo, sinus pain, snoring.  CV: Denies chest pain, palpitations, irregular heartbeat, syncope, dyspnea, diaphoresis, orthopnea, PND, claudication or edema. Respiratory: denies cough, dyspnea, DOE, pleurisy, hoarseness, laryngitis, wheezing.  Gastrointestinal: Denies dysphagia, odynophagia, heartburn, reflux, water brash, abdominal pain or cramps, nausea, vomiting, bloating, diarrhea, constipation, hematemesis, melena, hematochezia  or hemorrhoids. Genitourinary: Denies dysuria, frequency, urgency, nocturia, hesitancy, discharge, hematuria or flank pain. Musculoskeletal: Denies arthralgias, myalgias, stiffness, jt. swelling, pain, limping or strain/sprain.  Skin: Denies pruritus, rash, hives, warts,  acne, eczema or change in skin lesion(s). Neuro: No weakness, tremor, incoordination, spasms, paresthesia or pain. Psychiatric: Denies confusion, memory loss or sensory loss. Endo: Denies change in weight, skin or hair change.  Heme/Lymph: No excessive bleeding, bruising or enlarged lymph nodes.  Physical Exam  There were no vitals taken for this visit.  Appears well nourished, well groomed  and in no distress.  Eyes: PERRLA, EOMs, conjunctiva no swelling or erythema. Sinuses: No frontal/maxillary tenderness ENT/Mouth: EAC's clear, TM's nl w/o erythema, bulging. Nares clear w/o erythema, swelling, exudates. Oropharynx clear without erythema or exudates. Oral hygiene is good. Tongue normal, non obstructing. Hearing intact.  Neck: Supple. Thyroid nl. Car 2+/2+ without bruits, nodes or JVD. Chest: Respirations nl with BS clear & equal w/o rales, rhonchi, wheezing or stridor.  Cor: Heart sounds normal w/ regular rate and rhythm without sig. murmurs, gallops, clicks or rubs. Peripheral pulses normal and equal  without edema.  Abdomen: Soft & bowel sounds normal. Non-tender w/o guarding, rebound, hernias, masses or organomegaly.  Lymphatics: Unremarkable.  Musculoskeletal: Full ROM all peripheral extremities, joint stability, 5/5 strength and normal gait.  Skin: Warm, dry without exposed rashes, lesions or ecchymosis apparent.  Neuro: Cranial nerves intact, reflexes equal bilaterally. Sensory-motor testing grossly intact. Tendon reflexes grossly intact.  Pysch: Alert & oriented x 3.  Insight and judgement nl & appropriate. No ideations.  Assessment and Plan:  - Continue medication, monitor blood pressure at home.  - Continue DASH diet.  Reminder to go to the ER if any CP,  SOB, nausea, dizziness, severe HA, changes vision/speech.   - Continue diet/meds, exercise,& lifestyle modifications.  - Continue monitor periodic cholesterol/liver & renal functions   - Continue diet, exercise,  lifestyle modifications.  - Monitor appropriate labs. - Continue  supplementation.      Discussed  regular exercise, BP monitoring, weight control to achieve/maintain BMI less than 25 and discussed meds and SE's. Recommended labs to assess and monitor clinical status with further disposition pending results of labs. Over 30 minutes of exam, counseling, chart review was performed.   Marinus Maw, MD

## 2022-09-05 ENCOUNTER — Ambulatory Visit (INDEPENDENT_AMBULATORY_CARE_PROVIDER_SITE_OTHER): Payer: Medicare HMO | Admitting: Internal Medicine

## 2022-09-05 ENCOUNTER — Encounter: Payer: Self-pay | Admitting: Internal Medicine

## 2022-09-05 VITALS — BP 130/70 | HR 76 | Temp 97.9°F | Resp 17 | Ht 69.5 in | Wt 157.8 lb

## 2022-09-05 DIAGNOSIS — J449 Chronic obstructive pulmonary disease, unspecified: Secondary | ICD-10-CM

## 2022-09-05 DIAGNOSIS — E785 Hyperlipidemia, unspecified: Secondary | ICD-10-CM

## 2022-09-05 DIAGNOSIS — E1122 Type 2 diabetes mellitus with diabetic chronic kidney disease: Secondary | ICD-10-CM | POA: Diagnosis not present

## 2022-09-05 DIAGNOSIS — I10 Essential (primary) hypertension: Secondary | ICD-10-CM

## 2022-09-05 DIAGNOSIS — J9383 Other pneumothorax: Secondary | ICD-10-CM | POA: Diagnosis not present

## 2022-09-05 DIAGNOSIS — E1169 Type 2 diabetes mellitus with other specified complication: Secondary | ICD-10-CM | POA: Diagnosis not present

## 2022-09-05 DIAGNOSIS — Z8679 Personal history of other diseases of the circulatory system: Secondary | ICD-10-CM

## 2022-09-05 DIAGNOSIS — Z720 Tobacco use: Secondary | ICD-10-CM | POA: Diagnosis not present

## 2022-09-05 DIAGNOSIS — Z79899 Other long term (current) drug therapy: Secondary | ICD-10-CM | POA: Diagnosis not present

## 2022-09-05 DIAGNOSIS — I4892 Unspecified atrial flutter: Secondary | ICD-10-CM

## 2022-09-05 DIAGNOSIS — N182 Chronic kidney disease, stage 2 (mild): Secondary | ICD-10-CM

## 2022-09-05 NOTE — Patient Instructions (Signed)

## 2022-09-06 DIAGNOSIS — Z4802 Encounter for removal of sutures: Secondary | ICD-10-CM

## 2022-09-10 ENCOUNTER — Ambulatory Visit: Payer: Medicare HMO | Admitting: Adult Health

## 2022-09-14 ENCOUNTER — Ambulatory Visit: Payer: Medicare HMO | Admitting: Thoracic Surgery (Cardiothoracic Vascular Surgery)

## 2022-09-21 ENCOUNTER — Encounter: Payer: Self-pay | Admitting: Thoracic Surgery (Cardiothoracic Vascular Surgery)

## 2022-09-21 ENCOUNTER — Ambulatory Visit (INDEPENDENT_AMBULATORY_CARE_PROVIDER_SITE_OTHER): Payer: Self-pay | Admitting: Thoracic Surgery (Cardiothoracic Vascular Surgery)

## 2022-09-21 VITALS — BP 112/59 | HR 71 | Resp 20 | Ht 69.5 in | Wt 159.0 lb

## 2022-09-21 DIAGNOSIS — J9383 Other pneumothorax: Secondary | ICD-10-CM

## 2022-09-21 NOTE — Progress Notes (Signed)
      [address]       [place] [address]             [phone]        [facility] Medical Record #[id number] Date of Birth: 1946-03-06  Referring: Tomma Lightning, MD Primary Care: [name], MD Primary Cardiologist:None  Reason for visit:   follow-up  History of Present Illness:     77yo male presents for his 1st follow-up.  Overall he is doing well.  He denies any pain or shortness of breath.  He is not using any pain medication.  Physical Exam: BP (!) 112/59 (BP Location: Right Arm, Patient Position: Sitting, Cuff Size: Normal)   Pulse 71   Resp 20   Ht 5' 9.5" (1.765 m)   Wt 159 lb (72.1 kg)   SpO2 97% Comment: RA  BMI 23.14 kg/m   Alert NAD Incision clean.   Abdomen, ND no peripheral edema   Diagnostic Studies & Laboratory data:  Path:  FINAL MICROSCOPIC DIAGNOSIS:   A. LUNG, LEFT UPPER LOBE #1, WEDGE RESECTION:       Benign pulmonary parenchyma with segmental elastases, subpleural  fibrosis, chronic inflammation and degenerative changes.       No evidence of neoplasm or malignancy.       See comment.   B. PLEURA, LEFT:       Benign pleural tissue with mesothelial reaction.       Adjacent fibroadipose tissue with chronic inflammation.       No evidence of neoplasm or malignancy.   C. LUNG, LEFT UPPER LOBE #2, WEDGE RESECTION:       Benign pulmonary parenchyma with intraparenchymal hemorrhage,  congestion, and focal subpleural consolidation and degenerative changes.        No evidence of neoplasm or malignancy.     Assessment / Plan:   77yo male s/p wedge resection, apical pleurectomy, and mechanical pleurodesis for a spontaneous pneumothorax.  No evidence of malignancy.  He will follow-up in 1 month with a CXR.   [name] 09/21/2022 1:28 PM

## 2022-09-24 ENCOUNTER — Telehealth: Payer: Self-pay | Admitting: Cardiology

## 2022-09-24 NOTE — Telephone Encounter (Signed)
Patient calling the office for samples of medication:   1.  What medication and dosage are you requesting samples for? apixaban (ELIQUIS) 5 MG TABS tablet  2.  Are you currently out of this medication? No    

## 2022-09-24 NOTE — Telephone Encounter (Signed)
Left voicemail for patient to return call to office. 

## 2022-09-24 NOTE — Progress Notes (Signed)
Cardiology Clinic Note   Patient Name: Jesus Bush Date of Encounter: 10/01/2022  Primary Care Provider:  [name], MD Primary Cardiologist:  Jesus Rotunda, MD  Patient Profile    77 year old male with history of COPD and bullous emphysema, ongoing tobacco abuse, diabetes, hyperlipidemia and hypertension.  Recently admitted to the hospital from 08/22/2022 through 08/31/2022, with significant epigastric discomfort and found to have large left-sided pneumothorax.    Cardiology was consulted due to atrial flutter.  He was due to have  thoracentesis but this was canceled until cardiology consult was obtained.  The patient underwent robotic assisted thoracoscopy and left upper lobe wedge resection and apical pleurectomy with mechanical pleurodesis.  The patient converted to normal sinus rhythm and was discharged on Eliquis 5 mg twice daily.   Past Medical History    Past Medical History:  Diagnosis Date   Diabetes mellitus without complication (HCC)    Hyperlipidemia    Hypertension    Vitamin D deficiency    Past Surgical History:  Procedure Laterality Date   CATARACT EXTRACTION, BILATERAL Bilateral 2018   Dr. Wayland Bush   INTERCOSTAL NERVE BLOCK  08/27/2022   Procedure: INTERCOSTAL NERVE BLOCK;  Surgeon: Jesus Skains, MD;  Location: [facility] OR;  Service: Thoracic;;    Allergies  Allergies  Allergen Reactions   Ppd [Tuberculin Purified Protein Derivative]     Positive PPD.    History of Present Illness    [name] returns today for posthospitalization follow-up after presenting the hospital with atrial flutter in the setting of large left-sided  pneumothorax.  He is status post robotic assisted thoracoscopy and left upper lobe wedge resection with apical pleurectomy and mechanical pleurodesis.  With the patient converted to normal sinus rhythm and was sent home on Eliquis.   He comes today without any cardiac complaints.  He was cardiac unaware concerning his  atrial fibs flutter.  He is breathing much better has quit smoking, has already gone to the beach since discharge and denies any further complaints of shortness of breath, or fatigue.  He is medically compliant.  He is due to follow-up with thoracic surgery and 2 weeks.  He denies any issues with bleeding, hemoptysis, or melena.  Home Medications    Current Outpatient Medications  Medication Sig Dispense Refill   ACCU-CHEK SOFTCLIX LANCETS lancets Check blood sugar 1 time daily-DX-R73.09 100 each 3   albuterol (VENTOLIN HFA) 108 (90 Base) MCG/ACT inhaler 2 inhalations 15 minutes apart every 4 hours to Rescue Asthma 48 g 3   apixaban (ELIQUIS) 5 MG TABS tablet Take 1 tablet (5 mg total) by mouth 2 (two) times daily. 60 tablet 1   aspirin 81 MG tablet Take 81 mg by mouth daily.     Blood Glucose Calibration (ACCU-CHEK AVIVA) SOLN Use per box instruction. 1 each 3   blood glucose meter kit and supplies KIT Test blood sugar daily or as directed 1 each 0   Blood Glucose Monitoring Suppl (ACCU-CHEK AVIVA PLUS) w/Device KIT Check blood sugar 1 time daily-DX-R73.09 1 kit 0   cetirizine (ZYRTEC ALLERGY) 10 MG tablet Take one tablet nightly for allergies. 90 tablet 1   Cholecalciferol (VITAMIN D) 2000 UNITS CAPS Take 2 capsules by mouth daily.      diltiazem (DILACOR XR) 240 MG 24 hr capsule TAKE 1 CAPSULE DAILY FOR BLOOD PRESSURE AND HEART RHYTHM 90 capsule 3   famotidine (PEPCID) 40 MG tablet Take 1 tablet (40 mg total) by mouth 2 (two) times daily.  90 tablet 3   fluticasone (FLONASE) 50 MCG/ACT nasal spray SPRAY 2 SPRAYS INTO EACH NOSTRIL EVERY DAY 48 mL 2   Fluticasone-Umeclidin-Vilant (TRELEGY ELLIPTA) 200-62.5-25 MCG/ACT AEPB Use  1 Inhalation  Daily  for COPD /Asthma 180 each 3   glucose blood (ACCU-CHEK AVIVA PLUS) test strip Check blood sugar 1 time daily-R73.09. 100 each 3   ipratropium-albuterol (DUONEB) 0.5-2.5 (3) MG/3ML SOLN USE 1 VIAL IN NEBULIZER EVERY 4 TO 6 HOURS - As Needed 180 mL 11    losartan (COZAAR) 100 MG tablet TAKE 1 TABLET EVERY DAY FOR BLOOD PRESSURE (Patient taking differently: Take 100 mg by mouth daily. TAKE 1 TABLET EVERY DAY FOR BLOOD PRESSURE) 90 tablet 3   magnesium gluconate (MAGONATE) 500 MG tablet Take 500 mg by mouth daily.      montelukast (SINGULAIR) 10 MG tablet Take  1 tablet Daily for Allergies                                /                                                                   TAKE                                         BY                                                 MOUTH 90 tablet 3   pantoprazole (PROTONIX) 40 MG tablet Take  1 tablet  2 x /day  to Prevent Indigestion & Heartburn 180 tablet 3   rosuvastatin (CRESTOR) 40 MG tablet TAKE 1 TABLET DAILY FOR CHOLESTEROL 90 tablet 3   zinc gluconate 50 MG tablet Take 50 mg by mouth daily.     Alcohol Swabs (B-D SINGLE USE SWABS REGULAR) PADS Check blood sugar 1 time daily-DX-R73.09. (Patient not taking: Reported on 10/01/2022) 100 each 3   metoCLOPramide (REGLAN) 10 MG tablet Take  1 tablet  3 x/ day  with Lunch, Supper & Bedtime for Acid Indigestion & Reflux. (Patient not taking: Reported on 10/01/2022) 90 tablet 1   No current facility-administered medications for this visit.     Family History    Family History  Problem Relation Age of Onset   Stroke Mother    Heart disease Father    Heart attack Father    Diabetes Sister    Heart disease Sister    Heart attack Brother    Hypertension Brother    He indicated that his mother is deceased. He indicated that his father is deceased. He indicated that his sister is deceased. He indicated that his brother is deceased. He indicated that both of his sons are alive. He indicated that both of his grandchildren are alive.  Social History    Social History   Socioeconomic History   Marital status: Married    Spouse name: Not on file   Number  of children: 2   Years of education: Not on file   Highest education level: Not on file   Occupational History   Occupation: retired  Tobacco Use   Smoking status: Former    Packs/day: 1.00    Years: 44.00    Additional pack years: 0.00    Total pack years: 44.00    Types: Cigarettes    Start date: 16    Quit date: 05/30/2022    Years since quitting: 0.3   Smokeless tobacco: Never  Substance and Sexual Activity   Alcohol use: No    Alcohol/week: 0.0 standard drinks of alcohol    Comment: He states he no longer drinks alcohol   Drug use: No   Sexual activity: Not on file  Other Topics Concern   Not on file  Social History Narrative   Not on file   Social Determinants of Health   Financial Resource Strain: Not on file  Food Insecurity: No Food Insecurity (08/22/2022)   Hunger Vital Sign    Worried About Running Out of Food in the Last Year: Never true    Ran Out of Food in the Last Year: Never true  Transportation Needs: No Transportation Needs (08/22/2022)   PRAPARE - Administrator, Civil Service (Medical): No    Lack of Transportation (Non-Medical): No  Physical Activity: Not on file  Stress: Not on file  Social Connections: Not on file  Intimate Partner Violence: Not At Risk (08/22/2022)   Humiliation, Afraid, Rape, and Kick questionnaire    Fear of Current or Ex-Partner: No    Emotionally Abused: No    Physically Abused: No    Sexually Abused: No     Review of Systems    General:  No chills, fever, night sweats or weight changes.  Cardiovascular:  No chest pain, dyspnea on exertion, edema, orthopnea, palpitations, paroxysmal nocturnal dyspnea. Dermatological: No rash, lesions/masses Respiratory: No cough, dyspnea Urologic: No hematuria, dysuria Abdominal:   No nausea, vomiting, diarrhea, bright red blood per rectum, melena, or hematemesis Neurologic:  No visual changes, wkns, changes in mental status. All other systems reviewed and are otherwise negative except as noted above.  EKG Interpretation Date/Time:  Monday October 01 2022  11:18:03 EDT Ventricular Rate:  77 PR Interval:  128 QRS Duration:  80 QT Interval:  370 QTC Calculation: 418 R Axis:   5  Text Interpretation: Sinus rhythm with frequent Premature ventricular complexes When compared with ECG of 25-Aug-2022 14:45, Sinus rhythm has replaced Atrial flutter Vent. rate has decreased BY  46 BPM Confirmed by Joni Reining [phone]) on 10/01/2022 11:23:16 AM    Physical Exam    VS:  BP (!) 100/58 (BP Location: Left Arm, Patient Position: Sitting, Cuff Size: Normal)   Pulse 77   Ht 5\' 9"  (1.753 m)   Wt 155 lb 6.4 oz (70.5 kg)   SpO2 100%   BMI 22.95 kg/m  , BMI Body mass index is 22.95 kg/m.     GEN: Well nourished, well developed, in no acute distress. HEENT: normal. Neck: Supple, no JVD, carotid bruits, or masses. Cardiac: RRR, occasional extrasystole, no murmurs, rubs, or gallops. No clubbing, cyanosis, edema.  Radials/DP/PT 2+ and equal bilaterally.  Respiratory:  Respirations regular and unlabored, high-pitched expiratory sound at the right lower lobe.  Upper airway congested cough.Marland Kitchen GI: Soft, nontender, nondistended, BS + x 4. MS: no deformity or atrophy. Skin: warm and dry, no rash. Neuro:  Strength and sensation are intact. Psych:  Normal affect.  EKG Interpretation Date/Time:  Monday October 01 2022 11:18:03 EDT Ventricular Rate:  77 PR Interval:  128 QRS Duration:  80 QT Interval:  370 QTC Calculation: 418 R Axis:   5  Text Interpretation: Sinus rhythm with frequent Premature ventricular complexes When compared with ECG of 25-Aug-2022 14:45, Sinus rhythm has replaced Atrial flutter Vent. rate has decreased BY  46 BPM Confirmed by Joni Reining [phone]) on 10/01/2022 11:23:16 AM   Lab Results  Component Value Date   WBC 14.5 (H) 08/29/2022   HGB 12.4 (L) 08/29/2022   HCT 36.5 (L) 08/29/2022   MCV 93.6 08/29/2022   PLT 270 08/29/2022   Lab Results  Component Value Date   CREATININE 1.01 08/29/2022   BUN 18 08/29/2022   NA 135  08/29/2022   K 3.8 08/29/2022   CL 102 08/29/2022   CO2 26 08/29/2022   Lab Results  Component Value Date   ALT 15 08/29/2022   AST 18 08/29/2022   ALKPHOS 61 08/29/2022   BILITOT 0.4 08/29/2022   Lab Results  Component Value Date   CHOL 151 07/18/2022   HDL 41 07/18/2022   LDLCALC 77 07/18/2022   TRIG 240 (H) 07/18/2022   CHOLHDL 3.7 07/18/2022    Lab Results  Component Value Date   HGBA1C 6.6 (H) 07/18/2022     Review of Prior Studies Echocardiogram 08/26/2022 EKG Interpretation Date/Time:  Monday October 01 2022 11:18:03 EDT Ventricular Rate:  77 PR Interval:  128 QRS Duration:  80 QT Interval:  370 QTC Calculation: 418 R Axis:   5  Text Interpretation: Sinus rhythm with frequent Premature ventricular complexes When compared with ECG of 25-Aug-2022 14:45, Sinus rhythm has replaced Atrial flutter Vent. rate has decreased BY  46 BPM Confirmed by Joni Reining [phone]) on 10/01/2022 11:23:16 AM  1. Left ventricular ejection fraction, by estimation, is 60 to 65%. The  left ventricle has normal function. The left ventricle has no regional  wall motion abnormalities. There is mild left ventricular hypertrophy.  Left ventricular diastolic parameters  are indeterminate.   2. Right ventricular systolic function is normal. The right ventricular  size is normal. There is normal pulmonary artery systolic pressure. The  estimated right ventricular systolic pressure is 28.8 mmHg.   3. The mitral valve is normal in structure. No evidence of mitral valve  regurgitation.   4. The aortic valve is tricuspid. Aortic valve regurgitation is not  visualized. Aortic valve sclerosis/calcification is present, without any  evidence of aortic stenosis.   5. The inferior vena cava is normal in size with greater than 50%  respiratory variability, suggesting right atrial pressure of 3 mmHg.    Assessment & Plan   1.  Atrial fib flutter: Remains in normal sinus rhythm with PVCs.  He is cardiac  unaware.  He is not a candidate for metoprolol due to history of COPD.  The patient is feeling much better and is not having any issues with apixaban concerning bleeding or melena.  Per note by Dr. Shari Prows he will be on anticoagulation long-term.  Remains on diltiazem 240 mg with good heart rate control.  2.  Status post robotic assisted thoracoscopy with left upper lobe wedge resection.  He also had apical pleurectomy.  He states that he is feeling much better breathing better continues to have some coughing.  He is remaining active medically compliant.  Is due to follow-up with thoracic surgery in 2 weeks.  3.  Hypertension: Low normal blood  pressure today.  Continue current medication regimen.  He is asymptomatic.  If he becomes symptomatic may need to reduce dose of losartan.  4.  Hypercholesterolemia: Remains on rosuvastatin 40 mg daily.  Goal of LDL less than 100.  5.  Tobacco abuse: Patient stopped smoking for the last 2 months.  He continues to have a cough and will follow-up with pulmonology and primary care.  He is congratulated on this lifestyle change.           Signed, Bettey Mare. Liborio Nixon, ANP, AACC   10/01/2022 11:34 AM      Office [phone] Fax [phone]  Notice: This dictation was prepared with Dragon dictation along with smaller phrase technology. Any transcriptional errors that result from this process are unintentional and may not be corrected upon review.

## 2022-09-25 MED ORDER — APIXABAN 5 MG PO TABS: 5.0000 mg | ORAL_TABLET | Freq: Two times a day (BID) | ORAL | 1 refills | Status: DC

## 2022-09-25 NOTE — Telephone Encounter (Signed)
Pt was only discharged with 1 month rx for Eliquis with 0 refills from the hospital. I have sent in an updated rx to his [facility] so that he can pick up refill and not run out of med before he establishes with cardiology.

## 2022-09-25 NOTE — Telephone Encounter (Signed)
Patient has first appointment on July 8th after hospitalization. He has enough Eliquis to last until the 7th.  He would miss his morning dose of the 8th or we can give samples for 1 week.  Please advise

## 2022-09-25 NOTE — Telephone Encounter (Signed)
Notified patient wife of RX sent.  They will pick up to ensure he continues medication without pause.

## 2022-10-01 ENCOUNTER — Ambulatory Visit: Payer: Medicare HMO | Attending: Adult Health | Admitting: Adult Health

## 2022-10-01 ENCOUNTER — Encounter: Payer: Self-pay | Admitting: Adult Health

## 2022-10-01 VITALS — BP 100/58 | HR 77 | Ht 69.0 in | Wt 155.4 lb

## 2022-10-01 DIAGNOSIS — I700 Atherosclerosis of aorta: Secondary | ICD-10-CM | POA: Diagnosis not present

## 2022-10-01 MED ORDER — APIXABAN 5 MG PO TABS: 5.0000 mg | ORAL_TABLET | Freq: Two times a day (BID) | ORAL | 0 refills | Status: DC

## 2022-10-01 NOTE — Patient Instructions (Signed)
Medication Instructions:  No Changes *If you need a refill on your cardiac medications before your next appointment, please call your pharmacy*   Lab Work: No Labs If you have labs (blood work) drawn today and your tests are completely normal, you will receive your results only by: MyChart Message (if you have MyChart) OR A paper copy in the mail If you have any lab test that is abnormal or we need to change your treatment, we will call you to review the results.   Testing/Procedures: No Testing   Follow-Up: At [facility], you and your health needs are our priority.  As part of our continuing mission to provide you with exceptional heart care, we have created designated Provider Care Teams.  These Care Teams include your primary Cardiologist (physician) and Advanced Practice Providers (APPs -  Physician Assistants and Nurse Practitioners) who all work together to provide you with the care you need, when you need it.  We recommend signing up for the patient portal called "MyChart".  Sign up information is provided on this After Visit Summary.  MyChart is used to connect with patients for Virtual Visits (Telemedicine).  Patients are able to view lab/test results, encounter notes, upcoming appointments, etc.  Non-urgent messages can be sent to your provider as well.   To learn more about what you can do with MyChart, go to [email or web address].    Your next appointment:   3 month(s)  Provider:   Rollene Rotunda, MD

## 2022-10-03 ENCOUNTER — Ambulatory Visit: Payer: Medicare HMO | Admitting: Internal Medicine

## 2022-10-04 ENCOUNTER — Encounter: Payer: Self-pay | Admitting: Gastroenterology

## 2022-10-04 ENCOUNTER — Ambulatory Visit: Payer: Medicare HMO | Admitting: Gastroenterology

## 2022-10-04 VITALS — BP 120/78 | HR 79 | Ht 69.0 in | Wt 156.0 lb

## 2022-10-04 DIAGNOSIS — K449 Diaphragmatic hernia without obstruction or gangrene: Secondary | ICD-10-CM | POA: Diagnosis not present

## 2022-10-04 DIAGNOSIS — K219 Gastro-esophageal reflux disease without esophagitis: Secondary | ICD-10-CM | POA: Diagnosis not present

## 2022-10-04 NOTE — Patient Instructions (Addendum)
_______________________________________________________  If your blood pressure at your visit was 140/90 or greater, please contact your primary care physician to follow up on this.  _______________________________________________________  If you are age 78 or older, your body mass index should be between 23-30. Your Body mass index is 23.04 kg/m. If this is out of the aforementioned range listed, please consider follow up with your Primary Care Provider.  If you are age 60 or younger, your body mass index should be between 19-25. Your Body mass index is 23.04 kg/m. If this is out of the aformentioned range listed, please consider follow up with your Primary Care Provider.   ________________________________________________________  The [facility] providers would like to encourage you to use [facility] to communicate with providers for non-urgent requests or questions.  Due to long hold times on the telephone, sending your provider a message by [facility] may be a faster and more efficient way to get a response.  Please allow 48 business hours for a response.  Please remember that this is for non-urgent requests.  _______________________________________________________  It was a pleasure to see you today!  Thank you for trusting me with your gastrointestinal care!

## 2022-10-04 NOTE — Progress Notes (Signed)
[facility] Consult Note:  History: Jesus Bush 10/04/2022  Referring provider: [name], MD  Reason for consult/chief complaint: EGD (Pt states he is doing well today, pt is here to discuss having an EGD, pt states he have problems with indegestion)   Subjective  HPI: Jesus Bush was last seen by Dr. Arlyce Dice in 2014 for heme positive stool, prior colonoscopy having been done in 2009.  Colonoscopy was recommended, no report found so it does not appear to have been done.Jesus Bush had negative Cologuard tests in June 2019 and July 2022. July 2023 barium esophagram performed for dysphagia, report below. Referred to [place] after a 07/18/2022 primary care visit for persistent GERD symptoms.  He was given some diet and lifestyle antireflux advice, advised to resume both Protonix and Pepcid.  Jesus Bush tells me he has had a few years of intermittent heartburn and regurgitation with things more prominent and frequent in the last year or so.  He currently denies dysphagia odynophagia nausea or vomiting.  Soon after meals and sometimes hours afterward he will have a epigastric/lower sternal burning discomfort, but infrequently feels regurgitation of food or liquid into his mouth.  He tries not to eat past the mid-to-late afternoon, otherwise he will have significant symptoms in the evening and overnight hours.  He reports little or no improvement on acid suppression medicine as noted above, though he continues to take them. When asked about his diet, it is typically bacon eggs and grits for breakfast, hamburger for lunch, and then he and his wife usually eat out for mid afternoon meal.  Regarding his most recent medical issues noted below, he reports getting steadily stronger since the hospitalization for the pneumothorax, but still has some occasional pain in that side of the chest and his energy level is not back to normal.  He feels he is breathing comfortably on room air and he has  not been on supplemental oxygen.  ROS:  Review of Systems  Constitutional:  Positive for fatigue. Negative for appetite change and unexpected weight change.  HENT:  Negative for mouth sores and voice change.   Eyes:  Negative for pain and redness.  Respiratory:  Positive for shortness of breath. Negative for cough.   Cardiovascular:  Negative for chest pain and palpitations.  Genitourinary:  Negative for dysuria and hematuria.  Musculoskeletal:  Negative for arthralgias and myalgias.  Skin:  Negative for pallor and rash.  Neurological:  Negative for weakness and headaches.  Hematological:  Negative for adenopathy.     Past Medical History: Past Medical History:  Diagnosis Date   Diabetes mellitus without complication (HCC)    Hyperlipidemia    Hypertension    Vitamin D deficiency    Slyvester had a spontaneous pneumothorax leading to hospital admission 08/22/2022, and that required a 3 thoracoscopy with left-sided wedge resection and pleurectomy with pleurodesis by Dr. Cliffton Asters.  Biopsies benign.  (History of COPD and bullous emphysema) It should be noted that he presented that day with acute onset and escalating bloating and upper abdominal pain.  Discharge summary and subsequent cardiology office notes indicated that he had atrial flutter during that hospital stay, then converted to sinus rhythm and was discharged on Eliquis twice a day.  He remains on diltiazem and long-term OAC.  Past Surgical History: Past Surgical History:  Procedure Laterality Date   CATARACT EXTRACTION, BILATERAL Bilateral 2018   Dr. Wayland Denis   INTERCOSTAL NERVE BLOCK  08/27/2022   Procedure: INTERCOSTAL NERVE BLOCK;  Surgeon: Brynda Greathouse  O, MD;  Location: [facility] OR;  Service: Thoracic;;     Family History: Family History  Problem Relation Age of Onset   Stroke Mother    Heart disease Father    Heart attack Father    Diabetes Sister    Heart disease Sister    Heart attack Brother     Hypertension Brother    Colon cancer Neg Hx    Stomach cancer Neg Hx    Esophageal cancer Neg Hx     Social History: Social History   Socioeconomic History   Marital status: Married    Spouse name: Not on file   Number of children: 2   Years of education: Not on file   Highest education level: Not on file  Occupational History   Occupation: retired   Occupation: retired  Tobacco Use   Smoking status: Former    Current packs/day: 0.00    Average packs/day: 1 pack/day for 44.0 years (44.0 ttl pk-yrs)    Types: Cigarettes    Start date: 36    Quit date: 05/30/2022    Years since quitting: 0.3   Smokeless tobacco: Never  Vaping Use   Vaping status: Never Used  Substance and Sexual Activity   Alcohol use: No    Alcohol/week: 0.0 standard drinks of alcohol    Comment: He states he no longer drinks alcohol   Drug use: No   Sexual activity: Not on file  Other Topics Concern   Not on file  Social History Narrative   Not on file   Social Determinants of Health   Financial Resource Strain: Not on file  Food Insecurity: No Food Insecurity (08/22/2022)   Hunger Vital Sign    Worried About Running Out of Food in the Last Year: Never true    Ran Out of Food in the Last Year: Never true  Transportation Needs: No Transportation Needs (08/22/2022)   PRAPARE - Administrator, Civil Service (Medical): No    Lack of Transportation (Non-Medical): No  Physical Activity: Not on file  Stress: Not on file  Social Connections: Not on file    Allergies: Allergies  Allergen Reactions   Ppd [Tuberculin Purified Protein Derivative]     Positive PPD.    Outpatient Meds: Current Outpatient Medications  Medication Sig Dispense Refill   ACCU-CHEK SOFTCLIX LANCETS lancets Check blood sugar 1 time daily-DX-R73.09 100 each 3   albuterol (VENTOLIN HFA) 108 (90 Base) MCG/ACT inhaler 2 inhalations 15 minutes apart every 4 hours to Rescue Asthma 48 g 3   Alcohol Swabs (B-D SINGLE  USE SWABS REGULAR) PADS Check blood sugar 1 time daily-DX-R73.09. 100 each 3   apixaban (ELIQUIS) 5 MG TABS tablet Take 1 tablet (5 mg total) by mouth 2 (two) times daily. 60 tablet 1   apixaban (ELIQUIS) 5 MG TABS tablet Take 1 tablet (5 mg total) by mouth 2 (two) times daily. 14 tablet 0   Blood Glucose Calibration (ACCU-CHEK AVIVA) SOLN Use per box instruction. 1 each 3   blood glucose meter kit and supplies KIT Test blood sugar daily or as directed 1 each 0   Blood Glucose Monitoring Suppl (ACCU-CHEK AVIVA PLUS) w/Device KIT Check blood sugar 1 time daily-DX-R73.09 1 kit 0   cetirizine (ZYRTEC ALLERGY) 10 MG tablet Take one tablet nightly for allergies. 90 tablet 1   Cholecalciferol (VITAMIN D) 2000 UNITS CAPS Take 2 capsules by mouth daily.      diltiazem (DILACOR XR) 240 MG 24  hr capsule TAKE 1 CAPSULE DAILY FOR BLOOD PRESSURE AND HEART RHYTHM 90 capsule 3   famotidine (PEPCID) 40 MG tablet Take 1 tablet (40 mg total) by mouth 2 (two) times daily. 90 tablet 3   Fluticasone-Umeclidin-Vilant (TRELEGY ELLIPTA) 200-62.5-25 MCG/ACT AEPB Use  1 Inhalation  Daily  for COPD /Asthma 180 each 3   glucose blood (ACCU-CHEK AVIVA PLUS) test strip Check blood sugar 1 time daily-R73.09. 100 each 3   ipratropium-albuterol (DUONEB) 0.5-2.5 (3) MG/3ML SOLN USE 1 VIAL IN NEBULIZER EVERY 4 TO 6 HOURS - As Needed 180 mL 11   losartan (COZAAR) 100 MG tablet TAKE 1 TABLET EVERY DAY FOR BLOOD PRESSURE (Patient taking differently: Take 100 mg by mouth daily. TAKE 1 TABLET EVERY DAY FOR BLOOD PRESSURE) 90 tablet 3   magnesium gluconate (MAGONATE) 500 MG tablet Take 500 mg by mouth daily.      montelukast (SINGULAIR) 10 MG tablet Take  1 tablet Daily for Allergies                                /                                                                   TAKE                                         BY                                                 MOUTH 90 tablet 3   pantoprazole (PROTONIX) 40 MG tablet Take  1  tablet  2 x /day  to Prevent Indigestion & Heartburn 180 tablet 3   rosuvastatin (CRESTOR) 40 MG tablet TAKE 1 TABLET DAILY FOR CHOLESTEROL 90 tablet 3   zinc gluconate 50 MG tablet Take 50 mg by mouth daily.     aspirin 81 MG tablet Take 81 mg by mouth daily. (Patient not taking: Reported on 10/04/2022)     fluticasone (FLONASE) 50 MCG/ACT nasal spray SPRAY 2 SPRAYS INTO EACH NOSTRIL EVERY DAY (Patient not taking: Reported on 10/04/2022) 48 mL 2   metoCLOPramide (REGLAN) 10 MG tablet Take  1 tablet  3 x/ day  with Lunch, Supper & Bedtime for Acid Indigestion & Reflux. (Patient not taking: Reported on 10/01/2022) 90 tablet 1   No current facility-administered medications for this visit.      ___________________________________________________________________ Objective   Exam:  BP 120/78   Pulse 79   Ht 5\' 9"  (1.753 m)   Wt 156 lb (70.8 kg)   BMI 23.04 kg/m  Wt Readings from Last 3 Encounters:  10/04/22 156 lb (70.8 kg)  10/01/22 155 lb 6.4 oz (70.5 kg)  09/21/22 159 lb (72.1 kg)    General: Looks well, pleasant and conversational.  Breathing comfortably on room air.  Gets on exam table without difficulty. Eyes: sclera anicteric, no redness ENT: oral mucosa moist without lesions, no  cervical or supraclavicular lymphadenopathy CV: Regular without appreciable murmur, no JVD, no peripheral edema Resp: Globally decreased breath sounds bilaterally, normal RR and effort noted GI: soft, no tenderness, with active bowel sounds. No guarding or palpable organomegaly noted. Skin; warm and dry, no rash or jaundice noted Neuro: awake, alert and oriented x 3. Normal gross motor function and fluent speech  Labs:     Latest Ref Rng & Units 08/29/2022   12:29 AM 08/28/2022   12:31 AM 08/27/2022    5:37 AM  CBC  WBC 4.0 - 10.5 K/uL 14.5  15.3  10.5   Hemoglobin 13.0 - 17.0 g/dL 16.1  09.6  04.5   Hematocrit 39.0 - 52.0 % 36.5  37.2  39.1   Platelets 150 - 400 K/uL 270  237  252       Latest  Ref Rng & Units 08/29/2022   12:29 AM 08/28/2022   12:31 AM 08/26/2022    3:24 AM  CMP  Glucose 70 - 99 mg/dL 409  811  914   BUN 8 - 23 mg/dL 18  14  13    Creatinine 0.61 - 1.24 mg/dL 7.82  9.56  2.13   Sodium 135 - 145 mmol/L 135  134  135   Potassium 3.5 - 5.1 mmol/L 3.8  4.3  4.0   Chloride 98 - 111 mmol/L 102  99  99   CO2 22 - 32 mmol/L 26  27  24    Calcium 8.9 - 10.3 mg/dL 8.8  8.9  8.9   Total Protein 6.5 - 8.1 g/dL 6.4     Total Bilirubin 0.3 - 1.2 mg/dL 0.4     Alkaline Phos 38 - 126 U/L 61     AST 15 - 41 U/L 18     ALT 0 - 44 U/L 15        Radiologic Studies: CLINICAL DATA:  Sensation of food sticking in the esophagus   EXAM: ESOPHOGRAM / BARIUM SWALLOW / BARIUM TABLET STUDY   TECHNIQUE: Combined double contrast and single contrast examination performed using effervescent crystals, thick barium liquid, and thin barium liquid. The patient was observed with fluoroscopy swallowing a 13 mm barium sulphate tablet.   FLUOROSCOPY: Radiation Exposure Index (as provided by the fluoroscopic device): 15.2 mGy Kerma   COMPARISON:  None Available.   FINDINGS: Normal swallowing function in the high cervical  esophagus.   No mucosal irregularity in the thoracic esophagus or distal esophagus. No stricture or mass. Contrast flows through the GE junction.   With patient in prone positioning, mild esophageal dysmotility noted with poor initiation stripping wave and to and fro motion of the barium bolus. Moderate gastroesophageal reflux.   Small hiatal hernia.   Barium tab passed GE junction easily   IMPRESSION: 1. Mild esophageal dysmotility. 2. Moderate gastroesophageal reflux. 3. Small hiatal hernia. 4. No mucosal abnormality     Electronically Signed   By: Genevive Bi M.D.   On: 09/28/2021 14:42   Assessment: Encounter Diagnoses  Name Primary?   Gastroesophageal reflux disease, unspecified whether esophagitis present Yes   Hiatal hernia     A few  years of reflux symptoms, worsening about the last year.  Although the esophagram was reportedly done for dysphagia, he currently denies any difficulty with food getting stuck.  It is primarily heartburn with only infrequent episodes of regurgitation. I discussed the esophagram findings with him and in particular the small hiatal hernia that is probably playing some role in this.  He was disappointed by the lack of significant improvement with antacid medicine, but I also explained the inherent limitations of acid suppression therapy and reflux.  It sounds like he could make some significant improvements in his diet, particularly cutting down fat intake and portion size.  While I would not want him to lose weight since he is already thin and has chronic lung disease, some dietary changes would most likely help his reflux more than medicines may be able to do so.  He is at risk for Barrett's esophagus given his reported symptoms, demographics and prior smoking history.  Sounds unlikely that he has any gastric outlet obstruction since there has not been vomiting or weight loss.  No apparent stricture on prior esophagram, though there was some expected presbyesophagus like dysmotility.  In all, I offered him an upper endoscopy for further evaluation, which he was interested in doing but does not feel he is quite ready for that while still in some recovery from the most recent hospitalization.  He would need to be off Eliquis about 36 hours prior to the procedure if authorized by cardiology.  I think he should stay on the current acid suppression therapy to decrease the chance of long-term reflux esophagitis and stricture.  Written dietary advice regarding GERD and its diet and lifestyle changes was given to him.  He will contact us if he feels ready to pursue the upper endoscopy and we can proceed accordingly. Thank you for the courtesy of this consult.  Please call me with any questions or concerns.  Charlie Pitter III  CC: Referring provider noted above

## 2022-10-22 ENCOUNTER — Ambulatory Visit: Payer: Medicare HMO

## 2022-10-23 ENCOUNTER — Other Ambulatory Visit: Payer: Self-pay | Admitting: Thoracic Surgery (Cardiothoracic Vascular Surgery)

## 2022-10-23 DIAGNOSIS — J9383 Other pneumothorax: Secondary | ICD-10-CM

## 2022-10-24 ENCOUNTER — Ambulatory Visit (INDEPENDENT_AMBULATORY_CARE_PROVIDER_SITE_OTHER): Payer: Self-pay | Admitting: Surgical

## 2022-10-24 ENCOUNTER — Ambulatory Visit
Admission: RE | Admit: 2022-10-24 | Discharge: 2022-10-24 | Disposition: A | Discharge status: Home / Self Care | Payer: Medicare HMO | Source: Ambulatory Visit | Attending: Thoracic Surgery (Cardiothoracic Vascular Surgery) | Admitting: Thoracic Surgery (Cardiothoracic Vascular Surgery)

## 2022-10-24 VITALS — BP 123/67 | HR 78 | Resp 20 | Wt 159.0 lb

## 2022-10-24 DIAGNOSIS — J9383 Other pneumothorax: Secondary | ICD-10-CM

## 2022-10-24 DIAGNOSIS — R918 Other nonspecific abnormal finding of lung field: Secondary | ICD-10-CM | POA: Diagnosis not present

## 2022-10-24 NOTE — Patient Instructions (Signed)
Return to normal activity 

## 2022-10-24 NOTE — Progress Notes (Signed)
[address]       [place] [id number]             [phone]      [facility] Medical Record #[id number] Date of Birth: [birth year]  Referring: Tomma Lightning, MD Primary Care: [name], MD Primary Cardiologist: Rollene Rotunda, MD   Chief Complaint:   POST OP FOLLOW UP 08/27/2022   Patient:  Jesus Bush Pre-Op Dx: Left spontaneous pneumothorax   Post-op Dx:  same Procedure:   - Robotic assisted left video thoracoscopy - Wedge resection of the left upper lobe - Apical pleurectomy - Mechanical pleurodesis - Intercostal nerve block   Surgeon and Role:      * Lightfoot, Eliezer Lofts, MD - Primary     Assistant: Jaclyn Prime, PA-C  Anesthesia  general   History of Present Illness:    Patient is seen in the office on today's date and routine postsurgical follow-up.  He reports that he is feeling generally well.  He has maintained smoking cessation for approximately 9 weeks at this point and has no intention of restarting.  He has had no difficulties with his incisions.  He denies shortness of breath or cough.  He has some minor discomfort associated with one of the chest tube sites.  He is not using any pain medicine and does not feel like he needs it.      Past Medical History:  Diagnosis Date   Diabetes mellitus without complication (HCC)    Hyperlipidemia    Hypertension    Vitamin D deficiency      Social History   Tobacco Use  Smoking Status Former   Current packs/day: 0.00   Average packs/day: 1 pack/day for 44.0 years (44.0 ttl pk-yrs)   Types: Cigarettes   Start date: 76   Quit date: 05/30/2022   Years since quitting: 0.4  Smokeless Tobacco Never    Social History   Substance and Sexual Activity  Alcohol Use No   Alcohol/week: 0.0 standard drinks of alcohol   Comment: He states he no longer drinks alcohol     Allergies  Allergen Reactions   Ppd [Tuberculin Purified Protein Derivative]      Positive PPD.    Current Outpatient Medications  Medication Sig Dispense Refill   ACCU-CHEK SOFTCLIX LANCETS lancets Check blood sugar 1 time daily-DX-R73.09 100 each 3   albuterol (VENTOLIN HFA) 108 (90 Base) MCG/ACT inhaler 2 inhalations 15 minutes apart every 4 hours to Rescue Asthma 48 g 3   Alcohol Swabs (B-D SINGLE USE SWABS REGULAR) PADS Check blood sugar 1 time daily-DX-R73.09. 100 each 3   apixaban (ELIQUIS) 5 MG TABS tablet Take 1 tablet (5 mg total) by mouth 2 (two) times daily. 60 tablet 1   apixaban (ELIQUIS) 5 MG TABS tablet Take 1 tablet (5 mg total) by mouth 2 (two) times daily. 14 tablet 0   aspirin 81 MG tablet Take 81 mg by mouth daily. (Patient not taking: Reported on 10/04/2022)     Blood Glucose Calibration (ACCU-CHEK AVIVA) SOLN Use per box instruction. 1 each 3   blood glucose meter kit and supplies KIT Test blood sugar daily or as directed 1 each 0   Blood Glucose Monitoring Suppl (ACCU-CHEK AVIVA PLUS) w/Device KIT Check blood sugar 1 time daily-DX-R73.09 1 kit 0   cetirizine (ZYRTEC ALLERGY) 10 MG tablet Take one tablet nightly for allergies. 90 tablet 1   Cholecalciferol (VITAMIN D) 2000  UNITS CAPS Take 2 capsules by mouth daily.      diltiazem (DILACOR XR) 240 MG 24 hr capsule TAKE 1 CAPSULE DAILY FOR BLOOD PRESSURE AND HEART RHYTHM 90 capsule 3   famotidine (PEPCID) 40 MG tablet Take 1 tablet (40 mg total) by mouth 2 (two) times daily. 90 tablet 3   fluticasone (FLONASE) 50 MCG/ACT nasal spray SPRAY 2 SPRAYS INTO EACH NOSTRIL EVERY DAY (Patient not taking: Reported on 10/04/2022) 48 mL 2   Fluticasone-Umeclidin-Vilant (TRELEGY ELLIPTA) 200-62.5-25 MCG/ACT AEPB Use  1 Inhalation  Daily  for COPD /Asthma 180 each 3   glucose blood (ACCU-CHEK AVIVA PLUS) test strip Check blood sugar 1 time daily-R73.09. 100 each 3   ipratropium-albuterol (DUONEB) 0.5-2.5 (3) MG/3ML SOLN USE 1 VIAL IN NEBULIZER EVERY 4 TO 6 HOURS - As Needed 180 mL 11   losartan (COZAAR) 100 MG tablet  TAKE 1 TABLET EVERY DAY FOR BLOOD PRESSURE (Patient taking differently: Take 100 mg by mouth daily. TAKE 1 TABLET EVERY DAY FOR BLOOD PRESSURE) 90 tablet 3   magnesium gluconate (MAGONATE) 500 MG tablet Take 500 mg by mouth daily.      metoCLOPramide (REGLAN) 10 MG tablet Take  1 tablet  3 x/ day  with Lunch, Supper & Bedtime for Acid Indigestion & Reflux. (Patient not taking: Reported on 10/01/2022) 90 tablet 1   montelukast (SINGULAIR) 10 MG tablet Take  1 tablet Daily for Allergies                                /                                                                   TAKE                                         BY                                                 MOUTH 90 tablet 3   pantoprazole (PROTONIX) 40 MG tablet Take  1 tablet  2 x /day  to Prevent Indigestion & Heartburn 180 tablet 3   rosuvastatin (CRESTOR) 40 MG tablet TAKE 1 TABLET DAILY FOR CHOLESTEROL 90 tablet 3   zinc gluconate 50 MG tablet Take 50 mg by mouth daily.     No current facility-administered medications for this visit.       Physical Exam: There were no vitals taken for this visit.  General appearance: alert, cooperative, and no distress Heart: regular rate and rhythm Lungs: clear to auscultation bilaterally Abdomen: Benign exam Wound: Incisions well-healed without evidence of infection.   Diagnostic Studies & Laboratory data:     Recent Radiology Findings:   No results found.    Recent Lab Findings: Lab Results  Component Value Date   WBC 14.5 (H) 08/29/2022   HGB 12.4 (L) 08/29/2022   HCT 36.5 (L) 08/29/2022   PLT 270 08/29/2022  GLUCOSE 134 (H) 08/29/2022   CHOL 151 07/18/2022   TRIG 240 (H) 07/18/2022   HDL 41 07/18/2022   LDLCALC 77 07/18/2022   ALT 15 08/29/2022   AST 18 08/29/2022   NA 135 08/29/2022   K 3.8 08/29/2022   CL 102 08/29/2022   CREATININE 1.01 08/29/2022   BUN 18 08/29/2022   CO2 26 08/29/2022   TSH 1.481 08/25/2022   INR 1.1 08/26/2022   HGBA1C 6.6 (H)  07/18/2022      Assessment / Plan: Patient continues to do well.  His x-ray was reviewed but the final report is pending.  I find no significant abnormalities and specifically no pneumothorax, significant infiltrates or effusions.  We will see the patient again on a as needed basis for any surgically related needs or at request.  I encouraged ongoing commitment to smoking cessation which she plans to do.  I released him to routine activities.  I did not make any changes to his current medication regimen.      Medication Changes: No orders of the defined types were placed in this encounter.    Rowe Clack, PA-C  10/24/2022 10:46 AM

## 2022-10-30 ENCOUNTER — Telehealth: Payer: Self-pay | Admitting: Adult Health

## 2022-10-30 NOTE — Telephone Encounter (Signed)
Returned pt call. Pt was wanting a sample to help cover him while in out of town for 3 weeks. Advised pt he can get a refill while out of town. [facility], [facility] will [facility] in [place] and ask them for a refill and have it transferred to them from here. Pt verbalized understanding.

## 2022-10-30 NOTE — Telephone Encounter (Signed)
Patient calling the office for samples of medication:   1.  What medication and dosage are you requesting samples for?Eliquis  2.  Are you currently out of this medication? a few, need some until they come back from out of town

## 2022-11-16 ENCOUNTER — Other Ambulatory Visit: Payer: Self-pay

## 2022-11-16 DIAGNOSIS — K219 Gastro-esophageal reflux disease without esophagitis: Secondary | ICD-10-CM

## 2022-11-16 MED ORDER — FAMOTIDINE 40 MG PO TABS
40.0000 mg | ORAL_TABLET | Freq: Two times a day (BID) | ORAL | 3 refills | Status: AC
Start: 2022-11-16 — End: ?

## 2022-12-11 ENCOUNTER — Telehealth: Payer: Self-pay | Admitting: Cardiology

## 2022-12-11 MED ORDER — APIXABAN 5 MG PO TABS: 5.0000 mg | ORAL_TABLET | Freq: Two times a day (BID) | ORAL | 1 refills | Status: DC

## 2022-12-11 NOTE — Telephone Encounter (Signed)
*  STAT* If patient is at the pharmacy, call can be transferred to refill team.     1. Which medications need to be refilled? (please list name of each medication and dose if known) apixaban (ELIQUIS) 5 MG TABS tablet   2. Which pharmacy/location (including street and city if local pharmacy) is medication to be sent to? [id number] [place],  - [address]   3. Do they need a 30 day or 90 day supply?   90 day supply

## 2022-12-11 NOTE — Telephone Encounter (Signed)
Prescription refill request for Eliquis received. Indication: Afib  Last office visit: Jesus Bush, 10/01/2022 Scr: 1.12, 08/28/2022 Age: 77 yo  Weight: 72.1 kg   Refill sent.

## 2022-12-19 ENCOUNTER — Other Ambulatory Visit: Payer: Self-pay | Admitting: *Deleted

## 2022-12-19 DIAGNOSIS — G629 Polyneuropathy, unspecified: Secondary | ICD-10-CM

## 2022-12-19 NOTE — Progress Notes (Unsigned)
[address]       [place] [address]             [phone]    HPI: Patient returns for a postoperative follow-up having undergone robotic assisted left video thoracoscopy, wedge resection of the left upper lobe, apical pleurectomy, and mechanical pleurodesis on 08/27/22. The patient's early postoperative recovery while in the hospital was notable for new onset atrial flutter that resolved with time but he otherwise had a routine postoperative hospital stay. Since hospital discharge the patient reports left sided pain, numbness and tingling surrounding his incision sites.   Current Outpatient Medications  Medication Sig Dispense Refill   ACCU-CHEK SOFTCLIX LANCETS lancets Check blood sugar 1 time daily-DX-R73.09 100 each 3   albuterol (VENTOLIN HFA) 108 (90 Base) MCG/ACT inhaler 2 inhalations 15 minutes apart every 4 hours to Rescue Asthma 48 g 3   Alcohol Swabs (B-D SINGLE USE SWABS REGULAR) PADS Check blood sugar 1 time daily-DX-R73.09. 100 each 3   apixaban (ELIQUIS) 5 MG TABS tablet Take 1 tablet (5 mg total) by mouth 2 (two) times daily. 180 tablet 1   aspirin 81 MG tablet Take 81 mg by mouth daily.     Blood Glucose Calibration (ACCU-CHEK AVIVA) SOLN Use per box instruction. 1 each 3   blood glucose meter kit and supplies KIT Test blood sugar daily or as directed 1 each 0   Blood Glucose Monitoring Suppl (ACCU-CHEK AVIVA PLUS) w/Device KIT Check blood sugar 1 time daily-DX-R73.09 1 kit 0   cetirizine (ZYRTEC ALLERGY) 10 MG tablet Take one tablet nightly for allergies. 90 tablet 1   Cholecalciferol (VITAMIN D) 2000 UNITS CAPS Take 2 capsules by mouth daily.      diltiazem (DILACOR XR) 240 MG 24 hr capsule TAKE 1 CAPSULE DAILY FOR BLOOD PRESSURE AND HEART RHYTHM 90 capsule 3   famotidine (PEPCID) 40 MG tablet Take 1 tablet (40 mg total) by mouth 2 (two) times daily. 90 tablet 3   fluticasone (FLONASE) 50 MCG/ACT nasal spray SPRAY 2 SPRAYS INTO EACH NOSTRIL  EVERY DAY 48 mL 2   Fluticasone-Umeclidin-Vilant (TRELEGY ELLIPTA) 200-62.5-25 MCG/ACT AEPB Use  1 Inhalation  Daily  for COPD /Asthma 180 each 3   glucose blood (ACCU-CHEK AVIVA PLUS) test strip Check blood sugar 1 time daily-R73.09. 100 each 3   ipratropium-albuterol (DUONEB) 0.5-2.5 (3) MG/3ML SOLN USE 1 VIAL IN NEBULIZER EVERY 4 TO 6 HOURS - As Needed 180 mL 11   losartan (COZAAR) 100 MG tablet TAKE 1 TABLET EVERY DAY FOR BLOOD PRESSURE (Patient taking differently: Take 100 mg by mouth daily. TAKE 1 TABLET EVERY DAY FOR BLOOD PRESSURE) 90 tablet 3   magnesium gluconate (MAGONATE) 500 MG tablet Take 500 mg by mouth daily.      metoCLOPramide (REGLAN) 10 MG tablet Take  1 tablet  3 x/ day  with Lunch, Supper & Bedtime for Acid Indigestion & Reflux. 90 tablet 1   montelukast (SINGULAIR) 10 MG tablet Take  1 tablet Daily for Allergies                                /  TAKE                                         BY                                                 MOUTH 90 tablet 3   pantoprazole (PROTONIX) 40 MG tablet Take  1 tablet  2 x /day  to Prevent Indigestion & Heartburn 180 tablet 3   rosuvastatin (CRESTOR) 40 MG tablet TAKE 1 TABLET DAILY FOR CHOLESTEROL 90 tablet 3   zinc gluconate 50 MG tablet Take 50 mg by mouth daily.     No current facility-administered medications for this visit.   Vitals:  Physical Exam: ***  Diagnostic Tests: ***  Impression/Plan: *** Postoperative neuropathic pain: Will add Gabapentin  Jenny Reichmann, PA-C [facility] [phone]

## 2022-12-19 NOTE — Progress Notes (Signed)
Patient contacted the office stating he has had a fullness sensation along surgical site since robotic assisted thoracoscopy wedge resection with pleurectomy, and mechanical pleuradesis. States one of his chest tube sites has a pins/needle feeling as well as numbness. Patient states his left chest feels swollen by the end of the day. Denies redness, fevers, SOB. Discussed neuropathy with patient and advised sensation typically takes time to go away. Advised he can discuss symptoms with PA in clinic if he wishses. Patient agrees with clinic appt. Patient advised of CXR prior to scheduled appt. Patient verbalized understanding.

## 2022-12-20 ENCOUNTER — Ambulatory Visit
Admission: RE | Admit: 2022-12-20 | Discharge: 2022-12-20 | Disposition: A | Discharge status: Home / Self Care | Payer: Medicare HMO | Source: Ambulatory Visit | Attending: Thoracic Surgery (Cardiothoracic Vascular Surgery) | Admitting: Thoracic Surgery (Cardiothoracic Vascular Surgery)

## 2022-12-20 ENCOUNTER — Ambulatory Visit: Payer: Medicare HMO | Admitting: Physician Assistant

## 2022-12-20 VITALS — BP 136/71 | HR 71 | Resp 20 | Ht 69.0 in | Wt 163.0 lb

## 2022-12-20 DIAGNOSIS — Z9889 Other specified postprocedural states: Secondary | ICD-10-CM

## 2022-12-20 DIAGNOSIS — G629 Polyneuropathy, unspecified: Secondary | ICD-10-CM

## 2022-12-20 MED ORDER — GABAPENTIN 300 MG PO CAPS
300.0000 mg | ORAL_CAPSULE | Freq: Every day | ORAL | 1 refills | Status: DC
Start: 2022-12-20 — End: 2023-01-11

## 2022-12-20 NOTE — Patient Instructions (Signed)
Start Gabapentin 300mg  at bedtime for neuropathic pain and follow up with your PCP.

## 2022-12-24 NOTE — Progress Notes (Signed)
Assessment and Plan:  There are no diagnoses linked to this encounter.    Further disposition pending results of labs. Discussed med's effects and SE's.   Over 30 minutes of exam, counseling, chart review, and critical decision making was performed.   Future Appointments  Date Time Provider Department Center  12/25/2022  9:30 AM Raynelle Dick, NP [facility] None  01/03/2023 10:20 AM Rollene Rotunda, MD [facility] None  01/11/2023 11:00 AM [name], MD [facility] None  05/15/2023 11:00 AM Adela Glimpse, NP [facility] None    ------------------------------------------------------------------------------------------------------------------   HPI There were no vitals taken for this visit. 77 y.o.male presents for  Past Medical History:  Diagnosis Date   Diabetes mellitus without complication (HCC)    Hyperlipidemia    Hypertension    Vitamin D deficiency      Allergies  Allergen Reactions   Ppd [Tuberculin Purified Protein Derivative]     Positive PPD.    Current Outpatient Medications on File Prior to Visit  Medication Sig   ACCU-CHEK SOFTCLIX LANCETS lancets Check blood sugar 1 time daily-DX-R73.09   albuterol (VENTOLIN HFA) 108 (90 Base) MCG/ACT inhaler 2 inhalations 15 minutes apart every 4 hours to Rescue Asthma   Alcohol Swabs (B-D SINGLE USE SWABS REGULAR) PADS Check blood sugar 1 time daily-DX-R73.09.   apixaban (ELIQUIS) 5 MG TABS tablet Take 1 tablet (5 mg total) by mouth 2 (two) times daily.   aspirin 81 MG tablet Take 81 mg by mouth daily.   Blood Glucose Calibration (ACCU-CHEK AVIVA) SOLN Use per box instruction.   blood glucose meter kit and supplies KIT Test blood sugar daily or as directed   Blood Glucose Monitoring Suppl (ACCU-CHEK AVIVA PLUS) w/Device KIT Check blood sugar 1 time daily-DX-R73.09   cetirizine (ZYRTEC ALLERGY) 10 MG tablet Take one tablet nightly for allergies.   Cholecalciferol (VITAMIN D) 2000 UNITS CAPS Take 2 capsules by  mouth daily.    diltiazem (DILACOR XR) 240 MG 24 hr capsule TAKE 1 CAPSULE DAILY FOR BLOOD PRESSURE AND HEART RHYTHM   famotidine (PEPCID) 40 MG tablet Take 1 tablet (40 mg total) by mouth 2 (two) times daily.   fluticasone (FLONASE) 50 MCG/ACT nasal spray SPRAY 2 SPRAYS INTO EACH NOSTRIL EVERY DAY   Fluticasone-Umeclidin-Vilant (TRELEGY ELLIPTA) 200-62.5-25 MCG/ACT AEPB Use  1 Inhalation  Daily  for COPD /Asthma   gabapentin (NEURONTIN) 300 MG capsule Take 1 capsule (300 mg total) by mouth at bedtime.   glucose blood (ACCU-CHEK AVIVA PLUS) test strip Check blood sugar 1 time daily-R73.09.   ipratropium-albuterol (DUONEB) 0.5-2.5 (3) MG/3ML SOLN USE 1 VIAL IN NEBULIZER EVERY 4 TO 6 HOURS - As Needed   losartan (COZAAR) 100 MG tablet TAKE 1 TABLET EVERY DAY FOR BLOOD PRESSURE (Patient taking differently: Take 100 mg by mouth daily. TAKE 1 TABLET EVERY DAY FOR BLOOD PRESSURE)   magnesium gluconate (MAGONATE) 500 MG tablet Take 500 mg by mouth daily.    metoCLOPramide (REGLAN) 10 MG tablet Take  1 tablet  3 x/ day  with Lunch, Supper & Bedtime for Acid Indigestion & Reflux.   montelukast (SINGULAIR) 10 MG tablet Take  1 tablet Daily for Allergies                                /  TAKE                                         BY                                                 MOUTH   pantoprazole (PROTONIX) 40 MG tablet Take  1 tablet  2 x /day  to Prevent Indigestion & Heartburn   rosuvastatin (CRESTOR) 40 MG tablet TAKE 1 TABLET DAILY FOR CHOLESTEROL   zinc gluconate 50 MG tablet Take 50 mg by mouth daily.   No current facility-administered medications on file prior to visit.    ROS: all negative except above.   Physical Exam:  There were no vitals taken for this visit.  General Appearance: Well nourished, in no apparent distress. Eyes: PERRLA, EOMs, conjunctiva no swelling or erythema Sinuses: No Frontal/maxillary  tenderness ENT/Mouth: Ext aud canals clear, TMs without erythema, bulging. No erythema, swelling, or exudate on post pharynx.  Tonsils not swollen or erythematous. Hearing normal.  Neck: Supple, thyroid normal.  Respiratory: Respiratory effort normal, BS equal bilaterally without rales, rhonchi, wheezing or stridor.  Cardio: RRR with no MRGs. Brisk peripheral pulses without edema.  Abdomen: Soft, + BS.  Non tender, no guarding, rebound, hernias, masses. Lymphatics: Non tender without lymphadenopathy.  Musculoskeletal: Full ROM, 5/5 strength, normal gait.  Skin: Warm, dry without rashes, lesions, ecchymosis.  Neuro: Cranial nerves intact. Normal muscle tone, no cerebellar symptoms. Sensation intact.  Psych: Awake and oriented X 3, normal affect, Insight and Judgment appropriate.     Raynelle Dick, NP 12:34 PM [facility]

## 2022-12-25 ENCOUNTER — Encounter: Payer: Self-pay | Admitting: Nurse Practitioner

## 2022-12-25 ENCOUNTER — Ambulatory Visit (INDEPENDENT_AMBULATORY_CARE_PROVIDER_SITE_OTHER): Payer: Medicare HMO | Admitting: Nurse Practitioner

## 2022-12-25 VITALS — BP 104/60 | HR 72 | Temp 97.5°F | Ht 69.0 in | Wt 163.6 lb

## 2022-12-25 DIAGNOSIS — J9383 Other pneumothorax: Secondary | ICD-10-CM | POA: Diagnosis not present

## 2022-12-25 DIAGNOSIS — L720 Epidermal cyst: Secondary | ICD-10-CM | POA: Diagnosis not present

## 2022-12-25 DIAGNOSIS — Z9889 Other specified postprocedural states: Secondary | ICD-10-CM

## 2022-12-25 DIAGNOSIS — I10 Essential (primary) hypertension: Secondary | ICD-10-CM

## 2022-12-25 MED ORDER — CEPHALEXIN 500 MG PO CAPS
500.0000 mg | ORAL_CAPSULE | Freq: Two times a day (BID) | ORAL | 0 refills | Status: DC
Start: 2022-12-25 — End: 2022-12-28

## 2022-12-25 NOTE — Patient Instructions (Addendum)
Keep dressing on if possible.  If dressing falls off apply large bandaid with antibiotic ointment Follow up on Friday  Notify office if you develop fever, persistent drainage or increase in tenderness  Keflex 500mg  twice a day for 5 days- antibiotic  Epidermoid Cyst Drainage Epidermoid cyst drainage is a procedure to drain a fluid-filled sac that forms under your skin (epidermoid cyst). This type of cyst is filled with a thick, oily substance that is secreted by your skin glands. Epidermoid cysts are usually painless. You can often move the cyst under your skin. Sometimes an epidermoid cyst gets inflamed. It may become red, swollen, and painful. In this case, you may need this procedure to drain the cyst and provide relief from the discomfort caused by an inflamed cyst. Cysts that are treated only with drainage often come back (recur). You may need to have the cyst completely removed after healing from this procedure. Tell a health care provider about: Any allergies you have. All medicines you are taking, including vitamins, herbs, eye drops, creams, and over-the-counter medicines. Any problems you or family members have had with anesthetic medicines. Any blood disorders you have. Any surgeries you have had. Any medical conditions you have. Whether you are pregnant or may be pregnant. What are the risks? Generally, this is a safe procedure. However, problems may occur, including: Cyst recurrence. Infection. Bleeding. Allergic reactions to medicines. What happens before the procedure? Ask your health care provider about: Changing or stopping your regular medicines. This is especially important if you are taking diabetes medicines or blood thinners. Taking medicines such as aspirin and ibuprofen. These medicines can thin your blood. Do not take these medicines unless your health care provider tells you to take them. Taking over-the-counter medicines, vitamins, herbs, and supplements. Ask  your heath care provider what steps will be taken to help prevent infection. This may include washing your skin with a germ-killing soap. What happens during the procedure? The skin around the cyst will be injected with a numbing medicine (local anesthetic). An incision will be made over the cyst, and the wall of the cyst will be opened. A spreading instrument will be used to open up the cyst. The contents of the cyst will be removed with suction or irrigation. A thin strip of gauze packing may be placed in the cyst to keep it open and draining. The incision will be left open, and the cyst will be covered with a bandage (dressing). The procedure may vary among health care providers and hospitals. What can I expect after the procedure? After the procedure, it is common to have: Soreness. Blood-tinged fluid draining from the cyst. This drainage may stain your dressing. Follow these instructions at home: Medicines Take over-the-counter and prescription medicines only as told by your health care provider. If you were prescribed an antibiotic medicine, take it as told by your health care provider. Do not stop taking the antibiotic even if you start to feel better. Incision care  Follow instructions from your health care provider about how to take care of your incision. Make sure you: Wash your hands with soap and water for at least 20 seconds before and after you change your dressing. If soap and water are not available, use hand sanitizer. Change your dressing as told by your health care provider. Do not remove the packing. Do not try to put it back in if it falls out. You may need to return to your health care provider in a few days to  have the packing removed. After your packing is removed, follow instructions from your health care provider about how to keep your incision area clean. Check your incision area every day for signs of infection. Check for: Redness, swelling, or pain. More fluid  or blood. Warmth. Pus or a bad smell. General instructions Return to your normal activities as told by your health care provider. Ask your health care provider what activities are safe for you. Do not take baths, swim, or use a hot tub until your health care provider approves. Ask your health care provider if you may take showers. You may need to return to your health care provider to have the cyst removed after you heal from the drainage procedure. Keep all follow-up visits. This is important. Contact a health care provider if: You have chills or a fever. Blood soaks through your dressing. You have any signs of infection, especially spreading redness or increased pus coming from the cyst. Summary Epidermoid cyst drainage is a procedure to drain a fluid-filled sac that forms underneath your skin. If an epidermoid cyst gets inflamed, you may need to have a procedure to drain the cyst and relieve the discomfort caused by an inflamed cyst. During epidermoid cyst drainage, you will get local anesthesia so the cyst can be opened and the contents removed. You may have packing gauze put in the cyst to keep it open and draining. If you were prescribed an antibiotic medicine, take it as told by your health care provider. Do not stop taking the antibiotic even if you start to feel better. Epidermoid cysts often come back after drainage. You may need to have the cyst removed after you heal from the drainage procedure. This information is not intended to replace advice given to you by your health care provider. Make sure you discuss any questions you have with your health care provider. Document Revised: 06/16/2019 Document Reviewed: 06/17/2019 Elsevier Patient Education  2024 [employer].

## 2022-12-26 NOTE — Progress Notes (Signed)
Assessment and Plan:  Case was seen today for follow-up.  Diagnoses and all orders for this visit:  Cutaneous abscess of trunk, unspecified site of trunk I&D performed- large amount of purulent drainage and part of sac was removed Keep dressing on until Monday and then removed packing and continue to dress with gauze Keep area clean and dry Will add another 5 days of Kelfex Notify office if you develop fever, persistent drainage or increase in tenderness  Is going out of town- will follow up with Dr Oneta Rack on 01/11/23 or sooner if worsens  Essential hypertension - continue medications, DASH diet, exercise and monitor at home. Call if greater than 130/80.        Further disposition pending results of labs. Discussed med's effects and SE's.   Over 30 minutes of exam, counseling, chart review, and critical decision making was performed.   Future Appointments  Date Time Provider Department Center  01/03/2023 10:20 AM Rollene Rotunda, MD [facility] None  01/11/2023 11:00 AM [name], MD [facility] None  05/15/2023 11:00 AM Adela Glimpse, NP [facility] None    ------------------------------------------------------------------------------------------------------------------   HPI BP 126/72   Pulse 80   Temp 97.7 F (36.5 C)   Ht 5\' 9"  (1.753 m)   Wt 164 lb 3.2 oz (74.5 kg)   SpO2 98%   BMI 24.25 kg/m  77 y.o.male presents for reevaluation of epidermal cyst of back that had I&D on 12/25/22. He was started on Keflex BID for 5 days.  Today the incision is still draining purulent drainage.   BP well controlled with diltiazem 240 mg every day , Losartan 100 mg every day.  BP Readings from Last 3 Encounters:  12/28/22 126/72  12/25/22 104/60  12/20/22 136/71  Denies headaches, chest pain, shortness of breath and dizziness   BMI is Body mass index is 24.25 kg/m.,  Wt Readings from Last 3 Encounters:  12/28/22 164 lb 3.2 oz (74.5 kg)  12/25/22 163 lb 9.6 oz  (74.2 kg)  12/20/22 163 lb (73.9 kg)     Past Medical History:  Diagnosis Date   Diabetes mellitus without complication (HCC)    Hyperlipidemia    Hypertension    Vitamin D deficiency      Allergies  Allergen Reactions   Ppd [Tuberculin Purified Protein Derivative]     Positive PPD.    Current Outpatient Medications on File Prior to Visit  Medication Sig   ACCU-CHEK SOFTCLIX LANCETS lancets Check blood sugar 1 time daily-DX-R73.09   albuterol (VENTOLIN HFA) 108 (90 Base) MCG/ACT inhaler 2 inhalations 15 minutes apart every 4 hours to Rescue Asthma   Alcohol Swabs (B-D SINGLE USE SWABS REGULAR) PADS Check blood sugar 1 time daily-DX-R73.09.   apixaban (ELIQUIS) 5 MG TABS tablet Take 1 tablet (5 mg total) by mouth 2 (two) times daily.   Blood Glucose Calibration (ACCU-CHEK AVIVA) SOLN Use per box instruction.   blood glucose meter kit and supplies KIT Test blood sugar daily or as directed   Blood Glucose Monitoring Suppl (ACCU-CHEK AVIVA PLUS) w/Device KIT Check blood sugar 1 time daily-DX-R73.09   cetirizine (ZYRTEC ALLERGY) 10 MG tablet Take one tablet nightly for allergies.   Cholecalciferol (VITAMIN D) 2000 UNITS CAPS Take 2 capsules by mouth daily.    diltiazem (DILACOR XR) 240 MG 24 hr capsule TAKE 1 CAPSULE DAILY FOR BLOOD PRESSURE AND HEART RHYTHM   fluticasone (FLONASE) 50 MCG/ACT nasal spray SPRAY 2 SPRAYS INTO EACH NOSTRIL EVERY DAY   Fluticasone-Umeclidin-Vilant (TRELEGY ELLIPTA) 200-62.5-25  MCG/ACT AEPB Use  1 Inhalation  Daily  for COPD /Asthma   gabapentin (NEURONTIN) 300 MG capsule Take 1 capsule (300 mg total) by mouth at bedtime.   glucose blood (ACCU-CHEK AVIVA PLUS) test strip Check blood sugar 1 time daily-R73.09.   ipratropium-albuterol (DUONEB) 0.5-2.5 (3) MG/3ML SOLN USE 1 VIAL IN NEBULIZER EVERY 4 TO 6 HOURS - As Needed   losartan (COZAAR) 100 MG tablet TAKE 1 TABLET EVERY DAY FOR BLOOD PRESSURE (Patient taking differently: Take 100 mg by mouth daily. TAKE 1  TABLET EVERY DAY FOR BLOOD PRESSURE)   magnesium gluconate (MAGONATE) 500 MG tablet Take 500 mg by mouth daily.    metoCLOPramide (REGLAN) 10 MG tablet Take  1 tablet  3 x/ day  with Lunch, Supper & Bedtime for Acid Indigestion & Reflux.   montelukast (SINGULAIR) 10 MG tablet Take  1 tablet Daily for Allergies                                /                                                                   TAKE                                         BY                                                 MOUTH   pantoprazole (PROTONIX) 40 MG tablet Take  1 tablet  2 x /day  to Prevent Indigestion & Heartburn   rosuvastatin (CRESTOR) 40 MG tablet TAKE 1 TABLET DAILY FOR CHOLESTEROL   zinc gluconate 50 MG tablet Take 50 mg by mouth daily.   cephALEXin (KEFLEX) 500 MG capsule Take 1 capsule (500 mg total) by mouth 2 (two) times daily for 5 days.   No current facility-administered medications on file prior to visit.    ROS: all negative except above.   Physical Exam:  BP 126/72   Pulse 80   Temp 97.7 F (36.5 C)   Ht 5\' 9"  (1.753 m)   Wt 164 lb 3.2 oz (74.5 kg)   SpO2 98%   BMI 24.25 kg/m   General Appearance: Well nourished, in no apparent distress. Eyes: PERRLA, EOMs, conjunctiva no swelling or erythema  Neck: Supple, thyroid normal.  Respiratory: Respiratory effort normal, BS equal bilaterally without rales, rhonchi, wheezing or stridor.  Cardio: RRR with no MRGs. Brisk peripheral pulses without edema.  Abdomen: Soft, + BS.   Musculoskeletal: Full ROM, 5/5 strength, normal gait.  Skin: Warm, dry  4 X4 cm raised abscess on upper mid back. I& D performed see below  Neuro: Cranial nerves intact. Normal muscle tone, no cerebellar symptoms. Sensation intact.  Psych: Awake and oriented X 3, normal affect, Insight and Judgment appropriate.   Diagnosis: abscess - Location: mid back Procedure: Incision & drainage Informed consent:  Discussed risks  and benefits  of the procedure, as well as  the alternatives.  Informed consent was obtained. Anesthesia: 2 cc of 1% lidocaine without epinephrine The area was prepared and draped in a standard fashion. The lesion drained pus and white, chalky, cyst material. Due to its large size, the cavity was packed with iodoform gauze. Dressed with antibiotic gauze and tegaderm The patient tolerated the procedure well. The patient was instructed on post-op care.   Raynelle Dick, NP 9:55 AM [facility]

## 2022-12-28 ENCOUNTER — Encounter: Payer: Self-pay | Admitting: Nurse Practitioner

## 2022-12-28 ENCOUNTER — Ambulatory Visit (INDEPENDENT_AMBULATORY_CARE_PROVIDER_SITE_OTHER): Payer: Medicare HMO | Admitting: Nurse Practitioner

## 2022-12-28 VITALS — BP 126/72 | HR 80 | Temp 97.7°F | Ht 69.0 in | Wt 164.2 lb

## 2022-12-28 DIAGNOSIS — I10 Essential (primary) hypertension: Secondary | ICD-10-CM | POA: Diagnosis not present

## 2022-12-28 DIAGNOSIS — L720 Epidermal cyst: Secondary | ICD-10-CM

## 2022-12-28 DIAGNOSIS — L02219 Cutaneous abscess of trunk, unspecified: Secondary | ICD-10-CM | POA: Diagnosis not present

## 2022-12-28 MED ORDER — CEPHALEXIN 500 MG PO CAPS
500.0000 mg | ORAL_CAPSULE | Freq: Two times a day (BID) | ORAL | 0 refills | Status: AC
Start: 2022-12-28 — End: 2023-01-02

## 2022-12-28 NOTE — Patient Instructions (Signed)
Skin Abscess  A skin abscess is an infected area on or under your skin. It contains pus and other material. An abscess may also be called a furuncle, carbuncle, or boil. It is often the result of an infection caused by bacteria. An abscess can occur in or on almost any part of your body. Sometimes, an abscess may break open (rupture) on its own. In most cases, it will keep getting worse unless it is treated. An abscess can cause pain and make you feel ill. An untreated abscess can cause infection to spread to other parts of your body or your bloodstream. The abscess may need to be drained. You may also need to take antibiotics. What are the causes? An abscess occurs when germs, like bacteria, pass through your skin and cause an infection. This may be caused by: A scrape or cut on your skin. A puncture wound through your skin, such as a needle injection or insect bite. Blocked oil or sweat glands. Blocked and infected hair follicles. A fluid-filled sac that forms beneath your skin (sebaceous cyst) and becomes infected. What increases the risk? You may be more likely to develop an abscess if: You have problems with blood circulation, or you have a weak body defense system (immune system). You have diabetes. You have dry and irritated skin. You get injections often or use IV drugs. You have a foreign body in a wound, such as a splinter. You smoke or use tobacco products. What are the signs or symptoms? Symptoms of this condition include: A painful, firm bump under the skin. A bump with pus at the top. This may break through the skin and drain. Other symptoms include: Redness and swelling around the abscess. Warmth or tenderness. Swelling of the lymph nodes (glands) near the abscess. A sore on the skin. How is this diagnosed? This condition may be diagnosed based on a physical exam and your medical history. You may also have tests done, such as: A test of a sample of pus. This may be done  to find what is causing the infection. Blood tests. Imaging tests, such as an ultrasound, CT scan, or MRI. How is this treated? A small abscess that drains on its own may not need to be treated. Treatment for larger abscesses may include: Moist heat or a heat pack applied to the area a few times a day. Incision and drainage. This is a procedure to drain the abscess. Antibiotics. For a severe abscess, you may first get antibiotics through an IV and then change to antibiotics by mouth. Follow these instructions at home: Medicines Take over-the-counter and prescription medicines only as told by your provider. If you were prescribed antibiotics, take them as told by your provider. Do not stop using the antibiotic even if you start to feel better. Abscess care  If you have an abscess that has not drained, apply heat to the affected area. Use the heat source that your provider recommends, such as a moist heat pack or a heating pad. Place a towel between your skin and the heat source. Leave the heat on for 20-30 minutes at a time. If your skin turns bright red, remove the heat right away to prevent burns. The risk of burns is higher if you cannot feel pain, heat, or cold. Follow instructions from your provider about how to take care of your abscess. Make sure you: Cover the abscess with a bandage (dressing). Wash your hands with soap and water for at least 20 seconds before  and after you change the dressing or gauze. If soap and water are not available, use hand sanitizer. Change your dressing or gauze as told by your provider. Check your abscess every day for signs of an infection that is getting worse. Check for: More redness, swelling, pain, or tenderness. More fluid or blood. Warmth. More pus or a worse smell. General instructions To avoid spreading the infection: Do not share personal care items, towels, or hot tubs with others. Avoid making skin contact with other people. Be careful  when getting rid of used dressings, wound packing, or any drainage from the abscess. Do not use any products that contain nicotine or tobacco. These products include cigarettes, chewing tobacco, and vaping devices, such as e-cigarettes. If you need help quitting, ask your provider. Do not use any creams, ointments, or liquids unless you have been told to by your provider. Contact a health care provider if: You see redness that spreads quickly or red streaks on your skin spreading away from the abscess. You have any signs of worse infection at the abscess. You vomit every time you eat or drink. You have a fever, chills, or muscle aches. The cyst or abscess returns. Get help right away if: You have severe pain. You make less pee (urine) than normal. This information is not intended to replace advice given to you by your health care provider. Make sure you discuss any questions you have with your health care provider. Document Revised: 10/25/2021 Document Reviewed: 10/25/2021 Elsevier Patient Education  2024 [employer].

## 2023-01-01 ENCOUNTER — Other Ambulatory Visit: Payer: Self-pay

## 2023-01-01 DIAGNOSIS — I10 Essential (primary) hypertension: Secondary | ICD-10-CM

## 2023-01-01 MED ORDER — LOSARTAN POTASSIUM 100 MG PO TABS
ORAL_TABLET | ORAL | 3 refills | Status: DC
Start: 2023-01-01 — End: 2023-04-10

## 2023-01-02 NOTE — Progress Notes (Unsigned)
Cardiology Office Note:   Date:  01/02/2023  ID:  Jesus Bush, DOB 02-Sep-1945, MRN [id number] PCP: [name], MD  [facility] Providers Cardiologist:  Rollene Rotunda, MD {  History of Present Illness:   Jesus Bush is a 77 y.o. male with history of COPD and bullous emphysema, ongoing tobacco abuse, diabetes, hyperlipidemia and hypertension.  Recently admitted to the hospital from 08/22/2022 through 08/31/2022, with significant epigastric discomfort and found to have large left-sided pneumothorax.     Cardiology was consulted due to atrial flutter.  The patient underwent robotic assisted thoracoscopy and left upper lobe wedge resection and apical pleurectomy with mechanical pleurodesis.  The patient converted to normal sinus rhythm and was discharged on Eliquis 5 mg twice daily.   He presents for followup.  ***   ROS: ***  Studies Reviewed:    EKG:       ***  Risk Assessment/Calculations:   {Does this patient have ATRIAL FIBRILLATION?:[id number]} No BP recorded.  {Refresh Note OR Click here to enter BP  :1}***        Physical Exam:   VS:  There were no vitals taken for this visit.   Wt Readings from Last 3 Encounters:  12/28/22 164 lb 3.2 oz (74.5 kg)  12/25/22 163 lb 9.6 oz (74.2 kg)  12/20/22 163 lb (73.9 kg)     GEN: Well nourished, well developed in no acute distress NECK: No JVD; No carotid bruits CARDIAC: ***RRR, no murmurs, rubs, gallops RESPIRATORY:  Clear to auscultation without rales, wheezing or rhonchi  ABDOMEN: Soft, non-tender, non-distended EXTREMITIES:  No edema; No deformity   ASSESSMENT AND PLAN:   Atrial fib flutter:   ***  Remains in normal sinus rhythm with PVCs.  He is cardiac unaware.  He is not a candidate for metoprolol due to history of COPD.  The patient is feeling much better and is not having any issues with apixaban concerning bleeding or melena.  Per note by Dr. Shari Prows he will be on anticoagulation long-term.  Remains  on diltiazem 240 mg with good heart rate control.   Status post robotic assisted thoracoscopy with left upper lobe wedge resection.  ***  He also had apical pleurectomy.  He states that he is feeling much better breathing better continues to have some coughing.  He is remaining active medically compliant.  Is due to follow-up with thoracic surgery in 2 weeks.   Hypertension:  ***   Low normal blood pressure today.  Continue current medication regimen.  He is asymptomatic.  If he becomes symptomatic may need to reduce dose of losartan.   Hypercholesterolemia: ***  Remains on rosuvastatin 40 mg daily.  Goal of LDL less than 100.   Tobacco abuse:  ***  Patient stopped smoking for the last 2 months.  He continues to have a cough and will follow-up with pulmonology and primary care.  He is congratulated on this lifestyle change.    {Are you ordering a CV Procedure (e.g. stress test, cath, DCCV, TEE, etc)?   Press F2        :[id number]}  Follow up ***  Signed, Rollene Rotunda, MD

## 2023-01-03 ENCOUNTER — Ambulatory Visit: Payer: Medicare HMO | Attending: Cardiology | Admitting: Cardiology

## 2023-01-03 ENCOUNTER — Encounter: Payer: Self-pay | Admitting: Cardiology

## 2023-01-03 VITALS — BP 128/72 | HR 72 | Ht 69.0 in | Wt 165.8 lb

## 2023-01-03 DIAGNOSIS — E785 Hyperlipidemia, unspecified: Secondary | ICD-10-CM

## 2023-01-03 DIAGNOSIS — I10 Essential (primary) hypertension: Secondary | ICD-10-CM | POA: Diagnosis not present

## 2023-01-03 DIAGNOSIS — I480 Paroxysmal atrial fibrillation: Secondary | ICD-10-CM | POA: Diagnosis not present

## 2023-01-03 DIAGNOSIS — Z720 Tobacco use: Secondary | ICD-10-CM | POA: Diagnosis not present

## 2023-01-03 NOTE — Patient Instructions (Signed)
Medication Instructions:   STOP ELIQUIS  *If you need a refill on your cardiac medications before your next appointment, please call your pharmacy*   Follow-Up: At [facility], you and your health needs are our priority.  As part of our continuing mission to provide you with exceptional heart care, we have created designated Provider Care Teams.  These Care Teams include your primary Cardiologist (physician) and Advanced Practice Providers (APPs -  Physician Assistants and Nurse Practitioners) who all work together to provide you with the care you need, when you need it.  We recommend signing up for the patient portal called "MyChart".  Sign up information is provided on this After Visit Summary.  MyChart is used to connect with patients for Virtual Visits (Telemedicine).  Patients are able to view lab/test results, encounter notes, upcoming appointments, etc.  Non-urgent messages can be sent to your provider as well.   To learn more about what you can do with MyChart, go to [email or web address].    Your next appointment:   AS NEEDED

## 2023-01-11 ENCOUNTER — Ambulatory Visit (INDEPENDENT_AMBULATORY_CARE_PROVIDER_SITE_OTHER): Payer: Medicare HMO | Admitting: Internal Medicine

## 2023-01-11 ENCOUNTER — Encounter: Payer: Self-pay | Admitting: Internal Medicine

## 2023-01-11 VITALS — BP 136/70 | HR 56 | Temp 97.9°F | Resp 16 | Ht 69.0 in | Wt 167.0 lb

## 2023-01-11 DIAGNOSIS — R6889 Other general symptoms and signs: Secondary | ICD-10-CM

## 2023-01-11 DIAGNOSIS — Z125 Encounter for screening for malignant neoplasm of prostate: Secondary | ICD-10-CM

## 2023-01-11 DIAGNOSIS — N182 Chronic kidney disease, stage 2 (mild): Secondary | ICD-10-CM | POA: Diagnosis not present

## 2023-01-11 DIAGNOSIS — I10 Essential (primary) hypertension: Secondary | ICD-10-CM | POA: Diagnosis not present

## 2023-01-11 DIAGNOSIS — Z136 Encounter for screening for cardiovascular disorders: Secondary | ICD-10-CM | POA: Diagnosis not present

## 2023-01-11 DIAGNOSIS — E785 Hyperlipidemia, unspecified: Secondary | ICD-10-CM | POA: Diagnosis not present

## 2023-01-11 DIAGNOSIS — I700 Atherosclerosis of aorta: Secondary | ICD-10-CM | POA: Diagnosis not present

## 2023-01-11 DIAGNOSIS — E1122 Type 2 diabetes mellitus with diabetic chronic kidney disease: Secondary | ICD-10-CM

## 2023-01-11 DIAGNOSIS — M25512 Pain in left shoulder: Secondary | ICD-10-CM | POA: Diagnosis not present

## 2023-01-11 DIAGNOSIS — E559 Vitamin D deficiency, unspecified: Secondary | ICD-10-CM

## 2023-01-11 DIAGNOSIS — Z23 Encounter for immunization: Secondary | ICD-10-CM | POA: Diagnosis not present

## 2023-01-11 DIAGNOSIS — G8929 Other chronic pain: Secondary | ICD-10-CM

## 2023-01-11 DIAGNOSIS — Z87891 Personal history of nicotine dependence: Secondary | ICD-10-CM

## 2023-01-11 DIAGNOSIS — K219 Gastro-esophageal reflux disease without esophagitis: Secondary | ICD-10-CM

## 2023-01-11 DIAGNOSIS — Z0001 Encounter for general adult medical examination with abnormal findings: Secondary | ICD-10-CM | POA: Diagnosis not present

## 2023-01-11 DIAGNOSIS — J449 Chronic obstructive pulmonary disease, unspecified: Secondary | ICD-10-CM

## 2023-01-11 DIAGNOSIS — J441 Chronic obstructive pulmonary disease with (acute) exacerbation: Secondary | ICD-10-CM

## 2023-01-11 DIAGNOSIS — I480 Paroxysmal atrial fibrillation: Secondary | ICD-10-CM

## 2023-01-11 DIAGNOSIS — Z79899 Other long term (current) drug therapy: Secondary | ICD-10-CM | POA: Diagnosis not present

## 2023-01-11 DIAGNOSIS — E1169 Type 2 diabetes mellitus with other specified complication: Secondary | ICD-10-CM

## 2023-01-11 DIAGNOSIS — Z1211 Encounter for screening for malignant neoplasm of colon: Secondary | ICD-10-CM

## 2023-01-11 DIAGNOSIS — N138 Other obstructive and reflux uropathy: Secondary | ICD-10-CM

## 2023-01-11 DIAGNOSIS — Z8249 Family history of ischemic heart disease and other diseases of the circulatory system: Secondary | ICD-10-CM

## 2023-01-11 MED ORDER — DEXAMETHASONE 4 MG PO TABS
ORAL_TABLET | ORAL | 0 refills | Status: DC
Start: 2023-01-11 — End: 2023-02-13

## 2023-01-11 MED ORDER — PROMETHAZINE-DM 6.25-15 MG/5ML PO SYRP
ORAL_SOLUTION | ORAL | 1 refills | Status: DC
Start: 2023-01-11 — End: 2023-02-13

## 2023-01-11 MED ORDER — AZITHROMYCIN 250 MG PO TABS
ORAL_TABLET | ORAL | 1 refills | Status: DC
Start: 2023-01-11 — End: 2023-02-13

## 2023-01-11 MED ORDER — GABAPENTIN 300 MG PO CAPS
ORAL_CAPSULE | ORAL | 3 refills | Status: DC
Start: 2023-01-11 — End: 2023-04-10

## 2023-01-11 NOTE — Progress Notes (Signed)
Annual  Screening/Preventative Visit  & Comprehensive Evaluation & Examination   Future Appointments  Date Time Provider Department  01/11/2023 11:00 AM [name], MD [facility]  05/15/2023 11:00 AM Adela Glimpse, NP [facility]  01/29/2024 11:00 AM [name], MD [facility]       This very nice 77 y.o.  MWM presents for a Screening /Preventative Visit & comprehensive evaluation and management of multiple medical co-morbidities.  Patient has been followed for HTN, HLD, T2_NIDDM, Occult Hyperthyroidism, COPD and Vitamin D Deficiency.  Patient has prior hx/o GERD. Patient, a 33+ yr smoker,  is also followed by Pulmonary for a LUL nodule discovered with screening LD Chest CT  In 2020.  In June 2024, patient underwent Robotic LUL wedge resection by Dr Cliffton Asters. Path returned benign. Patient doses c/o recent increased productive cough & mucopurulent sputum. Patient is also c/o Lt shoulder pains.       HTN predates circa 2005. Patient's BP has been controlled at home.  Today's BP is at goal at 136/70. Patient denies any cardiac symptoms as chest pain, palpitations, shortness of breath, dizziness or ankle swelling.       Patient's hyperlipidemia is near controlled with diet and Rosuvastatin. Patient denies myalgias or other medication SE's. Last lipids were near goal with elevated Trig's:  Lab Results  Component Value Date   CHOL 151 07/18/2022   HDL 41 07/18/2022   LDLCALC 77 07/18/2022   TRIG 240 (H) 07/18/2022   CHOLHDL 3.7 07/18/2022         Patient has hx/o T2_NIDDM  (A1c 7.5% / 2010)  and was treated with MF til Aug 2018 and has since controlled with diet.  Patient has  Diabetic CKD2 (GFR 77).   Patient denies reactive hypoglycemic symptoms, visual blurring, diabetic polys or paresthesias. Last A1c was near goal:  Lab Results  Component Value Date   HGBA1C 6.6 (H) 07/18/2022        Patient is followed for Occult or sub-clinical Hyperthyroidism  (2019-2020).      Finally, patient has history of Vitamin D Deficiency ("23" / 2008)  and last vitamin D was at goal:  Lab Results  Component Value Date   VD25OH 65 07/18/2022       Current Outpatient Medications  Medication Instructions   albuterol  HFA   inhaler 2 inhalations every 4 hours to Rescue Asthma   cetirizine 10 MG tablet Take one tablet nightly for allergies.   VITAMIN D  2000 u 2 capsules, Oral, Daily   diltiazem XR 240 MG  TAKE 1 CAPSULE DAILY F   FLONASE  nasal spray SPRAY 2 SPRAYS INTO EACH NOSTRIL EVERY DAY   TRELEGY ELLIPTA  200-62.5-25 Use  1 Inhalation  Daily  for COPD /Asthma   gabapentin 300 mg,  Oral, Daily at bedtime   ipratropium-albuterol (DUONEB) 0.5-2.5 (3) MG/3ML SOLN USE 1 VIAL IN  NEB  EVERY 4 TO 6 HOURS - As Needed   losartan (COZAAR) 100 MG tablet TAKE 1 TABLET EVERY DAY    magnesium 500 mg Daily   metoCLOPramide (REGLAN) 10 MG tablet Take  1 tablet  3 x/ day     montelukast (SINGULAIR) 10 MG tablet Take  1 tablet Daily    pantoprazole (PROTONIX) 40 MG tablet Take  1 tablet  2 x /day    rosuvastatin 40 MG tablet TAKE 1 TABLET DAILY   zinc 50 mg Daily    Allergies  Allergen Reactions   Ppd [Tuberculin Purified  Protein Derivative]     Positive PPD.     Past Medical History:  Diagnosis Date   Diabetes mellitus without complication (HCC)    Hyperlipidemia    Hypertension    Vitamin D deficiency      Health Maintenance  Topic Date Due   Hepatitis C Screening  Never done   Zoster Vaccines- Shingrix (1 of 2) Never done   COVID-19 Vaccine (3 - Pfizer risk series) 07/06/2019   INFLUENZA VACCINE  10/24/2021   TETANUS/TDAP  08/07/2026   Pneumonia Vaccine 39+ Years old  Completed   HPV VACCINES  Aged Out   Fecal DNA (Cologuard)  Discontinued      Immunization History  Administered Date(s) Administered   DTaP 05/23/2006   Influenza Whole 12/16/2012   Influenza, High Dose Seasonal PF 12/17/2017, 01/14/2019, 01/27/2020, 01/24/2021    Influenza 12/27/2013   PFIZER-SARS-COV-2 Vaccination 05/18/2019, 06/08/2019   Pneumococcal -13 04/13/2014   Pneumococcal -23 09/13/2008, 08/06/2016   Td 08/06/2016   Zoster, Live 04/17/2013   Last Colon - 08/15/2007 - Dr Arlyce Dice - recc 10 yr f/u    - Dr Myrtie Neither sent a recall letter 07/17/2017  - Patient agreeable to Cologard  - Cologard 09/23/2020  - Negative - recc 3 year f/u due July 2025  Past Surgical History:  Procedure Laterality Date   CATARACT EXTRACTION, BILATERAL Bilateral 2018   Dr. Wayland Denis   Family History  Problem Relation Age of Onset   Heart attack Mother    Heart disease Mother    Heart attack Brother    Hypertension Brother    Diabetes Sister    Heart disease Sister    Heart disease Father    Heart attack Father    Social History   Socioeconomic History   Marital status: Married    Spouse name: Not on file   Number of children: 2  Occupational History   Occupation: Retired Games developer  Tobacco Use   Smoking status: Former Smoker    Packs/day: 1.50    Years: 44.00    Pack years: 66.00    Types: Cigarettes    Start date: 1987    Quit date: 06/14/2019    Years since quitting: 0.4   Smokeless tobacco: Never Used  Substance and Sexual Activity   Alcohol use: No    Alcohol/week: 0.0 standard drinks    Comment: He states he no longer drinks alcohol   Drug use: No   Sexual activity: Not on file     ROS Constitutional: Denies fever, chills, weight loss/gain, headaches, insomnia,  night sweats or change in appetite. Does c/o fatigue. Eyes: Denies redness, blurred vision, diplopia, discharge, itchy or watery eyes.  ENT: Denies discharge, congestion, post nasal drip, epistaxis, sore throat, earache, hearing loss, dental pain, Tinnitus, Vertigo, Sinus pain or snoring.  Cardio: Denies chest pain, palpitations, irregular heartbeat, syncope, dyspnea, diaphoresis, orthopnea, PND, claudication or edema Respiratory: denies cough, dyspnea, DOE,  pleurisy, hoarseness, laryngitis or wheezing.  Gastrointestinal: Denies dysphagia, heartburn, reflux, water brash, pain, cramps, nausea, vomiting, bloating, diarrhea, constipation, hematemesis, melena, hematochezia, jaundice or hemorrhoids Genitourinary: Denies dysuria, frequency,  discharge, hematuria or flank pain. Has urgency, nocturia x 2-3 & occasional hesitancy. Musculoskeletal: Denies arthralgia, myalgia, stiffness, Jt. Swelling, pain, limp or strain/sprain. Denies Falls. Skin: Denies puritis, rash, hives, warts, acne, eczema or change in skin lesion Neuro: No weakness, tremor, incoordination, spasms, paresthesia or pain Psychiatric: Denies confusion, memory loss or sensory loss. Denies Depression. Endocrine: Denies change in weight, skin, hair  change, nocturia, and paresthesia, diabetic polys, visual blurring or hyper / hypo glycemic episodes.  Heme/Lymph: No excessive bleeding, bruising or enlarged lymph nodes.  Physical Exam  BP 136/70   Pulse (!) 56   Temp 97.9 F (36.6 C)   Resp 16   Ht 5\' 9"  (1.753 m)   Wt 167 lb (75.8 kg)   SpO2 94%   BMI 24.66 kg/m   General Appearance: Well nourished and well groomed and in no apparent distress.  Eyes: PERRLA, EOMs, conjunctiva no swelling or erythema, normal fundi and vessels. Sinuses: No frontal/maxillary tenderness ENT/Mouth: EACs patent / TMs  nl. Nares clear without erythema, swelling, mucoid exudates. Oral hygiene is good. No erythema, swelling, or exudate. Tongue normal, non-obstructing. Tonsils not swollen or erythematous. Hearing normal.  Neck: Supple, thyroid not palpable. No bruits, nodes or JVD. Respiratory: Respiratory effort normal.  BS equal and clear bilateral with scattered coarse rales, rhonchi and wheezing. No stridor. Cardio: Heart sounds are normal with regular rate and rhythm and no murmurs, rubs or gallops. Peripheral pulses are normal and equal bilaterally without edema. No aortic or femoral bruits. Chest:  symmetric with normal excursions and percussion.  Abdomen: Soft, with Nl bowel sounds. Nontender, no guarding, rebound, hernias, masses, or organomegaly.  Lymphatics: Non tender without lymphadenopathy.  Musculoskeletal:  Decreased ROM Lt shoulder with tenderness of anterior joint line and over deltoid bursa.  Normal gait.  After informed consent & aseptic prep with alcohol, 1 cc Dexamethasone (10 mg/ml)  & 1 ml of Marcaine 0.5% injected into the trigger point tenderness of the left shoulder . Then another 1 cc Dexamethasone (10 mg/ml)  & 1 ml of Marcaine 0.5% injected into the trigger point tenderness of the Left Deltoid bursae.   Skin: Warm and dry without rashes, lesions, cyanosis, clubbing or  ecchymosis.  Neuro: Cranial nerves intact, reflexes equal bilaterally. Normal muscle tone, no cerebellar symptoms. Sensation intact.  Pysch: Alert and oriented x 3 with normal affect, insight and judgment appropriate.   Assessment and Plan  1. Annual Preventative/Screening Exam    2. Essential hypertension  - EKG 12-Lead - [place], RETROPERITNL ABD,  LTD - Urinalysis, Routine w reflex microscopic - Microalbumin / creatinine urine ratio - CBC with Differential/Platelet - COMPLETE METABOLIC PANEL WITH GFR - Magnesium - TSH  3. Hyperlipidemia associated with type 2 diabetes mellitus (HCC)  - EKG 12-Lead - [place], RETROPERITNL ABD,  LTD - Lipid panel - TSH  4. Type 2 diabetes mellitus with stage 2 chronic kidney  disease, without long-term current use of insulin (HCC)  - EKG 12-Lead - [place], RETROPERITNL ABD,  LTD - HM DIABETES FOOT EXAM - Hemoglobin A1c - Insulin, random - PR LOW EXTEMITY NEUR EXAM DOCUM  5. Vitamin D deficiency  - VITAMIN D 25 Hydroxy   6. Gastroesophageal reflux disease  - CBC with Differential/Platelet  7. Chronic obstructive pulmonary disease (HCC)   8. Hyperthyroidism  - TSH  9. Benign prostatic hyperplasia with lower urinary tract symptoms,  - PSA  10.  Prostate cancer screening  - PSA  11. Screening for colorectal cancer  - POC Hemoccult Bld/Stl   12. Aortic atherosclerosis (HCC)  - EKG 12-Lead - [place], RETROPERITNL ABD,  LTD  13. Screening for heart disease  - EKG 12-Lead  14. FHx: heart disease  - EKG 12-Lead - [place], RETROPERITNL ABD,  LTD  15. Former smoker (44 pack year hx, quit 2021)  - EKG 12-Lead - [place], RETROPERITNL ABD,  LTD  16.  Screening for abdominal aortic aneurysm  - [place], RETROPERITNL ABD,  LTD  17. Medication management  - Urinalysis, Routine w reflex microscopic - Microalbumin / creatinine urine ratio - CBC with Differential/Platelet - COMPLETE METABOLIC PANEL WITH GFR - Magnesium - Lipid panel - TSH - Hemoglobin A1c - Insulin, random - VITAMIN D 25 Hydroxy   18. Need for immunization against influenza  - Flu vaccine HIGH DOSE PF (Fluzone High dose)  19. Abnormal glucose   20. Atherosclerosis of native coronary artery  of native heart without angina pectoris           Patient was counseled in prudent diet, weight control to achieve/maintain BMI less than 25, BP monitoring, regular exercise and medications as discussed.  Discussed med effects and SE's. Routine screening labs and tests as requested with regular follow-up as recommended. Over 40 minutes of exam, counseling, chart review and high complex critical decision making was performed   Marinus Maw, MD

## 2023-01-11 NOTE — Patient Instructions (Signed)

## 2023-01-12 ENCOUNTER — Other Ambulatory Visit: Payer: Self-pay | Admitting: Internal Medicine

## 2023-01-12 DIAGNOSIS — E1122 Type 2 diabetes mellitus with diabetic chronic kidney disease: Secondary | ICD-10-CM

## 2023-01-12 MED ORDER — GLIPIZIDE 5 MG PO TABS
ORAL_TABLET | ORAL | 3 refills | Status: DC
Start: 2023-01-12 — End: 2023-04-10

## 2023-01-12 NOTE — Progress Notes (Signed)
<>*<>*<>*<>*<>*<>*<>*<>*<>*<>*<>*<>*<>*<>*<>*<>*<>*<>*<>*<>*<>*<>*<>*<>*<> <>*<>*<>*<>*<>*<>*<>*<>*<>*<>*<>*<>*<>*<>*<>*<>*<>*<>*<>*<>*<>*<>*<>*<>*<>  -  Test results slightly outside the reference range are not unusual. If there is anything important, I will review this with you,  otherwise it is considered normal test values.  If you have further questions,  please do not hesitate to contact me at the office or via My Chart.   <>*<>*<>*<>*<>*<>*<>*<>*<>*<>*<>*<>*<>*<>*<>*<>*<>*<>*<>*<>*<>*<>*<>*<>*<> <>*<>*<>*<>*<>*<>*<>*<>*<>*<>*<>*<>*<>*<>*<>*<>*<>*<>*<>*<>*<>*<>*<>*<>*<>  -  Chol = 149   &   LDL is 82    - Both  Excellent   - Very low risk for Heart Attack  / Stroke  - But, Triglycerides (= 220  ) or fats in blood are too high                 (   Ideal or  Goal is less than 150  !  )    - Recommend avoid fried & greasy foods,  sweets / candy,   - Avoid white rice  (brown or wild rice or Quinoa is OK),   - Avoid white potatoes  (sweet potatoes are OK)   - Avoid anything made from white flour  - bagels, doughnuts, rolls, buns, biscuits, white and   wheat breads, pizza crust and traditional  pasta made of white flour & egg white  - (vegetarian pasta or spinach or wheat pasta is OK).    - Multi-grain bread is OK - like multi-grain flat bread or  sandwich thins.   - Avoid alcohol in excess.   - Exercise is also important  <>*<>*<>*<>*<>*<>*<>*<>*<>*<>*<>*<>*<>*<>*<>*<>*<>*<>*<>*<>*<>*<>*<>*<>*<> <>*<>*<>*<>*<>*<>*<>*<>*<>*<>*<>*<>*<>*<>*<>*<>*<>*<>*<>*<>*<>*<>*<>*<>*<>  -  A1c = 7.0% - Sent in Rx for Glipizide to take with your 2 larger meals of the day &   it should help keep your sugars down as well as help you gain back a few pounds.   <>*<>*<>*<>*<>*<>*<>*<>*<>*<>*<>*<>*<>*<>*<>*<>*<>*<>*<>*<>*<>*<>*<>*<>*<> <>*<>*<>*<>*<>*<>*<>*<>*<>*<>*<>*<>*<>*<>*<>*<>*<>*<>*<>*<>*<>*<>*<>*<>*<>  -  PSA - Low - No Prostate cancer   -  Great  !  <>*<>*<>*<>*<>*<>*<>*<>*<>*<>*<>*<>*<>*<>*<>*<>*<>*<>*<>*<>*<>*<>*<>*<>*<> <>*<>*<>*<>*<>*<>*<>*<>*<>*<>*<>*<>*<>*<>*<>*<>*<>*<>*<>*<>*<>*<>*<>*<>*<>  -  Vitamin D = 67 - Great  !      Please keep dosage same   <>*<>*<>*<>*<>*<>*<>*<>*<>*<>*<>*<>*<>*<>*<>*<>*<>*<>*<>*<>*<>*<>*<>*<>*<> <>*<>*<>*<>*<>*<>*<>*<>*<>*<>*<>*<>*<>*<>*<>*<>*<>*<>*<>*<>*<>*<>*<>*<>*<>  -  All Else - CBC - Kidneys - Electrolytes - Liver - Magnesium & Thyroid    - all  Normal / OK  <>*<>*<>*<>*<>*<>*<>*<>*<>*<>*<>*<>*<>*<>*<>*<>*<>*<>*<>*<>*<>*<>*<>*<>*<> <>*<>*<>*<>*<>*<>*<>*<>*<>*<>*<>*<>*<>*<>*<>*<>*<>*<>*<>*<>*<>*<>*<>*<>*<>

## 2023-01-14 LAB — CBC WITH DIFFERENTIAL/PLATELET
Absolute Lymphocytes: 1883 {cells}/uL (ref 850–3900)
Absolute Monocytes: 737 {cells}/uL (ref 200–950)
Basophils Absolute: 80 {cells}/uL (ref 0–200)
Basophils Relative: 1.1 %
Eosinophils Absolute: 80 {cells}/uL (ref 15–500)
Eosinophils Relative: 1.1 %
HCT: 41.5 % (ref 38.5–50.0)
Hemoglobin: 13.4 g/dL (ref 13.2–17.1)
MCH: 30.7 pg (ref 27.0–33.0)
MCHC: 32.3 g/dL (ref 32.0–36.0)
MCV: 95 fL (ref 80.0–100.0)
MPV: 10.9 fL (ref 7.5–12.5)
Monocytes Relative: 10.1 %
Neutro Abs: 4519 {cells}/uL (ref 1500–7800)
Neutrophils Relative %: 61.9 %
Platelets: 311 10*3/uL (ref 140–400)
RBC: 4.37 10*6/uL (ref 4.20–5.80)
RDW: 12.2 % (ref 11.0–15.0)
Total Lymphocyte: 25.8 %
WBC: 7.3 10*3/uL (ref 3.8–10.8)

## 2023-01-14 LAB — HEMOGLOBIN A1C
Hgb A1c MFr Bld: 7 %{Hb} — ABNORMAL HIGH (ref <5.7)
Mean Plasma Glucose: 154 mg/dL
eAG (mmol/L): 8.5 mmol/L

## 2023-01-14 LAB — TSH: TSH: 0.57 m[IU]/L (ref 0.40–4.50)

## 2023-01-14 LAB — MICROALBUMIN / CREATININE URINE RATIO
Creatinine, Urine: 106 mg/dL (ref 20–320)
Microalb Creat Ratio: 3 mg/g{creat} (ref <30)
Microalb, Ur: 0.3 mg/dL

## 2023-01-14 LAB — COMPLETE METABOLIC PANEL WITH GFR
AG Ratio: 1.7 (calc) (ref 1.0–2.5)
ALT: 33 U/L (ref 9–46)
AST: 28 U/L (ref 10–35)
Albumin: 4.3 g/dL (ref 3.6–5.1)
Alkaline phosphatase (APISO): 92 U/L (ref 35–144)
BUN: 12 mg/dL (ref 7–25)
CO2: 29 mmol/L (ref 20–32)
Calcium: 9.8 mg/dL (ref 8.6–10.3)
Chloride: 103 mmol/L (ref 98–110)
Creat: 1.03 mg/dL (ref 0.70–1.28)
Globulin: 2.6 g/dL (ref 1.9–3.7)
Glucose, Bld: 90 mg/dL (ref 65–99)
Potassium: 5 mmol/L (ref 3.5–5.3)
Sodium: 140 mmol/L (ref 135–146)
Total Bilirubin: 0.5 mg/dL (ref 0.2–1.2)
Total Protein: 6.9 g/dL (ref 6.1–8.1)
eGFR: 75 mL/min/{1.73_m2} (ref >=60)

## 2023-01-14 LAB — LIPID PANEL
Cholesterol: 149 mg/dL (ref <200)
HDL: 35 mg/dL — ABNORMAL LOW (ref >=40)
LDL Cholesterol (Calc): 82 mg/dL
Non-HDL Cholesterol (Calc): 114 mg/dL (ref <130)
Total CHOL/HDL Ratio: 4.3 (calc) (ref <5.0)
Triglycerides: 220 mg/dL — ABNORMAL HIGH (ref <150)

## 2023-01-14 LAB — URINALYSIS, ROUTINE W REFLEX MICROSCOPIC
Bilirubin Urine: NEGATIVE
Glucose, UA: NEGATIVE
Hgb urine dipstick: NEGATIVE
Ketones, ur: NEGATIVE
Leukocytes,Ua: NEGATIVE
Nitrite: NEGATIVE
Protein, ur: NEGATIVE
Specific Gravity, Urine: 1.017 (ref 1.001–1.035)
pH: 7.5 (ref 5.0–8.0)

## 2023-01-14 LAB — VITAMIN D 25 HYDROXY (VIT D DEFICIENCY, FRACTURES): Vit D, 25-Hydroxy: 67 ng/mL (ref 30–100)

## 2023-01-14 LAB — PSA: PSA: 1.6 ng/mL (ref <=4.00)

## 2023-01-14 LAB — INSULIN, RANDOM: Insulin: 10 u[IU]/mL

## 2023-01-14 LAB — MAGNESIUM: Magnesium: 2.1 mg/dL (ref 1.5–2.5)

## 2023-02-13 ENCOUNTER — Encounter: Payer: Self-pay | Admitting: Nurse Practitioner

## 2023-02-13 ENCOUNTER — Other Ambulatory Visit: Payer: Self-pay

## 2023-02-13 ENCOUNTER — Ambulatory Visit (INDEPENDENT_AMBULATORY_CARE_PROVIDER_SITE_OTHER): Payer: Medicare HMO | Admitting: Nurse Practitioner

## 2023-02-13 ENCOUNTER — Inpatient Hospital Stay (HOSPITAL_BASED_OUTPATIENT_CLINIC_OR_DEPARTMENT_OTHER)
Admission: EM | Admit: 2023-02-13 | Discharge: 2023-02-15 | DRG: 871 | Disposition: A | Discharge status: Home / Self Care | Payer: Medicare HMO | Attending: Family Medicine | Admitting: Family Medicine

## 2023-02-13 ENCOUNTER — Emergency Department (HOSPITAL_BASED_OUTPATIENT_CLINIC_OR_DEPARTMENT_OTHER): Payer: Medicare HMO

## 2023-02-13 VITALS — BP 112/60 | HR 137 | Temp 101.5°F | Ht 69.0 in | Wt 168.2 lb

## 2023-02-13 DIAGNOSIS — R0603 Acute respiratory distress: Secondary | ICD-10-CM | POA: Diagnosis not present

## 2023-02-13 DIAGNOSIS — E039 Hypothyroidism, unspecified: Secondary | ICD-10-CM | POA: Diagnosis present

## 2023-02-13 DIAGNOSIS — A419 Sepsis, unspecified organism: Secondary | ICD-10-CM | POA: Diagnosis present

## 2023-02-13 DIAGNOSIS — E119 Type 2 diabetes mellitus without complications: Secondary | ICD-10-CM | POA: Diagnosis present

## 2023-02-13 DIAGNOSIS — R000 Tachycardia, unspecified: Secondary | ICD-10-CM

## 2023-02-13 DIAGNOSIS — I480 Paroxysmal atrial fibrillation: Secondary | ICD-10-CM | POA: Diagnosis present

## 2023-02-13 DIAGNOSIS — N179 Acute kidney failure, unspecified: Secondary | ICD-10-CM

## 2023-02-13 DIAGNOSIS — Z79899 Other long term (current) drug therapy: Secondary | ICD-10-CM

## 2023-02-13 DIAGNOSIS — R7989 Other specified abnormal findings of blood chemistry: Secondary | ICD-10-CM | POA: Diagnosis present

## 2023-02-13 DIAGNOSIS — Z9841 Cataract extraction status, right eye: Secondary | ICD-10-CM | POA: Diagnosis not present

## 2023-02-13 DIAGNOSIS — Z9842 Cataract extraction status, left eye: Secondary | ICD-10-CM

## 2023-02-13 DIAGNOSIS — J440 Chronic obstructive pulmonary disease with acute lower respiratory infection: Secondary | ICD-10-CM | POA: Diagnosis present

## 2023-02-13 DIAGNOSIS — J449 Chronic obstructive pulmonary disease, unspecified: Secondary | ICD-10-CM

## 2023-02-13 DIAGNOSIS — I10 Essential (primary) hypertension: Secondary | ICD-10-CM

## 2023-02-13 DIAGNOSIS — Z1152 Encounter for screening for COVID-19: Secondary | ICD-10-CM

## 2023-02-13 DIAGNOSIS — Z833 Family history of diabetes mellitus: Secondary | ICD-10-CM

## 2023-02-13 DIAGNOSIS — Z7984 Long term (current) use of oral hypoglycemic drugs: Secondary | ICD-10-CM

## 2023-02-13 DIAGNOSIS — Z8249 Family history of ischemic heart disease and other diseases of the circulatory system: Secondary | ICD-10-CM | POA: Diagnosis not present

## 2023-02-13 DIAGNOSIS — R6520 Severe sepsis without septic shock: Secondary | ICD-10-CM | POA: Diagnosis present

## 2023-02-13 DIAGNOSIS — Z823 Family history of stroke: Secondary | ICD-10-CM | POA: Diagnosis not present

## 2023-02-13 DIAGNOSIS — E782 Mixed hyperlipidemia: Secondary | ICD-10-CM | POA: Diagnosis present

## 2023-02-13 DIAGNOSIS — Z87891 Personal history of nicotine dependence: Secondary | ICD-10-CM

## 2023-02-13 DIAGNOSIS — R509 Fever, unspecified: Secondary | ICD-10-CM | POA: Diagnosis not present

## 2023-02-13 DIAGNOSIS — J189 Pneumonia, unspecified organism: Secondary | ICD-10-CM | POA: Diagnosis present

## 2023-02-13 DIAGNOSIS — I700 Atherosclerosis of aorta: Secondary | ICD-10-CM | POA: Diagnosis not present

## 2023-02-13 DIAGNOSIS — R079 Chest pain, unspecified: Secondary | ICD-10-CM | POA: Diagnosis not present

## 2023-02-13 LAB — URINALYSIS, W/ REFLEX TO CULTURE (INFECTION SUSPECTED)
Bacteria, UA: NONE SEEN
Bilirubin Urine: NEGATIVE
Glucose, UA: NEGATIVE mg/dL
Hgb urine dipstick: NEGATIVE
Ketones, ur: NEGATIVE mg/dL
Leukocytes,Ua: NEGATIVE
Nitrite: NEGATIVE
Protein, ur: 30 mg/dL — AB
Specific Gravity, Urine: 1.023 (ref 1.005–1.030)
pH: 5.5 (ref 5.0–8.0)

## 2023-02-13 LAB — RESP PANEL BY RT-PCR (RSV, FLU A&B, COVID)  RVPGX2
Influenza A by PCR: NEGATIVE
Influenza B by PCR: NEGATIVE
Resp Syncytial Virus by PCR: NEGATIVE
SARS Coronavirus 2 by RT PCR: NEGATIVE

## 2023-02-13 LAB — COMPREHENSIVE METABOLIC PANEL
ALT: 30 U/L (ref 0–44)
AST: 27 U/L (ref 15–41)
Albumin: 4.3 g/dL (ref 3.5–5.0)
Alkaline Phosphatase: 73 U/L (ref 38–126)
Anion gap: 10 (ref 5–15)
BUN: 19 mg/dL (ref 8–23)
CO2: 24 mmol/L (ref 22–32)
Calcium: 9.8 mg/dL (ref 8.9–10.3)
Chloride: 104 mmol/L (ref 98–111)
Creatinine, Ser: 1.47 mg/dL — ABNORMAL HIGH (ref 0.61–1.24)
GFR, Estimated: 49 mL/min — ABNORMAL LOW (ref >60)
Glucose, Bld: 132 mg/dL — ABNORMAL HIGH (ref 70–99)
Potassium: 3.7 mmol/L (ref 3.5–5.1)
Sodium: 138 mmol/L (ref 135–145)
Total Bilirubin: 0.6 mg/dL (ref <1.2)
Total Protein: 7.2 g/dL (ref 6.5–8.1)

## 2023-02-13 LAB — PROTIME-INR
INR: 1 (ref 0.8–1.2)
Prothrombin Time: 13.8 s (ref 11.4–15.2)

## 2023-02-13 LAB — CBC WITH DIFFERENTIAL/PLATELET
Abs Immature Granulocytes: 0.13 10*3/uL — ABNORMAL HIGH (ref 0.00–0.07)
Basophils Absolute: 0.1 10*3/uL (ref 0.0–0.1)
Basophils Relative: 0 %
Eosinophils Absolute: 0 10*3/uL (ref 0.0–0.5)
Eosinophils Relative: 0 %
HCT: 38.1 % — ABNORMAL LOW (ref 39.0–52.0)
Hemoglobin: 12.8 g/dL — ABNORMAL LOW (ref 13.0–17.0)
Immature Granulocytes: 1 %
Lymphocytes Relative: 6 %
Lymphs Abs: 1.1 10*3/uL (ref 0.7–4.0)
MCH: 30.5 pg (ref 26.0–34.0)
MCHC: 33.6 g/dL (ref 30.0–36.0)
MCV: 90.7 fL (ref 80.0–100.0)
Monocytes Absolute: 1.5 10*3/uL — ABNORMAL HIGH (ref 0.1–1.0)
Monocytes Relative: 8 %
Neutro Abs: 16.3 10*3/uL — ABNORMAL HIGH (ref 1.7–7.7)
Neutrophils Relative %: 85 %
Platelets: 312 10*3/uL (ref 150–400)
RBC: 4.2 MIL/uL — ABNORMAL LOW (ref 4.22–5.81)
RDW: 13.5 % (ref 11.5–15.5)
WBC: 19 10*3/uL — ABNORMAL HIGH (ref 4.0–10.5)
nRBC: 0 % (ref 0.0–0.2)

## 2023-02-13 LAB — LACTIC ACID, PLASMA
Lactic Acid, Venous: 1.3 mmol/L (ref 0.5–1.9)
Lactic Acid, Venous: 4 mmol/L (ref 0.5–1.9)

## 2023-02-13 LAB — APTT: aPTT: 30 s (ref 24–36)

## 2023-02-13 MED ORDER — ACETAMINOPHEN 325 MG PO TABS: 650.0000 mg | ORAL_TABLET | Freq: Four times a day (QID) | ORAL | Status: DC | PRN

## 2023-02-13 MED ORDER — ALBUTEROL SULFATE (2.5 MG/3ML) 0.083% IN NEBU
5.0000 mg | INHALATION_SOLUTION | Freq: Once | RESPIRATORY_TRACT | Status: AC
  Administered 2023-02-13: 5 mg via RESPIRATORY_TRACT
  Filled 2023-02-13: qty 6

## 2023-02-13 MED ORDER — IPRATROPIUM BROMIDE 0.02 % IN SOLN
0.5000 mg | Freq: Once | RESPIRATORY_TRACT | Status: AC
  Administered 2023-02-13: 0.5 mg via RESPIRATORY_TRACT
  Filled 2023-02-13: qty 2.5

## 2023-02-13 MED ORDER — ENOXAPARIN SODIUM 40 MG/0.4ML IJ SOSY
40.0000 mg | PREFILLED_SYRINGE | Freq: Every day | INTRAMUSCULAR | Status: DC
  Administered 2023-02-14 (×2): 40 mg via SUBCUTANEOUS
  Filled 2023-02-13 (×2): qty 0.4

## 2023-02-13 MED ORDER — LACTATED RINGERS IV BOLUS (SEPSIS)
500.0000 mL | Freq: Once | INTRAVENOUS | Status: AC
  Administered 2023-02-13: 500 mL via INTRAVENOUS

## 2023-02-13 MED ORDER — SODIUM CHLORIDE 0.9 % IV SOLN
2.0000 g | INTRAVENOUS | Status: DC
  Administered 2023-02-13 – 2023-02-14 (×2): 2 g via INTRAVENOUS
  Filled 2023-02-13 (×2): qty 20

## 2023-02-13 MED ORDER — SODIUM CHLORIDE 0.9 % IV SOLN
500.0000 mg | INTRAVENOUS | Status: DC
  Administered 2023-02-13 – 2023-02-14 (×2): 500 mg via INTRAVENOUS
  Filled 2023-02-13 (×2): qty 5

## 2023-02-13 MED ORDER — ACETAMINOPHEN 500 MG PO TABS
1000.0000 mg | ORAL_TABLET | Freq: Once | ORAL | Status: AC
  Administered 2023-02-13: 1000 mg via ORAL
  Filled 2023-02-13: qty 2

## 2023-02-13 NOTE — Assessment & Plan Note (Signed)
-   This was provoked following a pneumothorax in the past.  Cardiology has cleared him from taking Eliquis.

## 2023-02-13 NOTE — Assessment & Plan Note (Deleted)
-   Secondary to pneumonia - Patient presented with fever, tachycardia, tachypnea and leukocytosis with chest x-ray finding of atypical possible viral pneumonia - Has been started on IV Rocephin and azithromycin and will continue - Will obtain respiratory viral panel

## 2023-02-13 NOTE — ED Provider Notes (Signed)
[place] EMERGENCY DEPARTMENT AT [facility] Provider Note   CSN: [id number] Arrival date & time: 02/13/23  1616     History  Chief Complaint  Patient presents with   Emesis    Jesus Bush is a 77 y.o. male.  Patient is a 77 year old male with a history of hypertension, hyperlipidemia, diabetes who is presenting today with a 36-hour history of feeling unwell.  He reports that started yesterday with a persistent cough and some bodyaches when he went to bed last night.  However he reports when he woke up this morning he felt okay but as the day went on he started feeling unwell.  He he reports that he has had continued coughing but this afternoon he started having repetitive vomiting.  He denies any shortness of breath or productive cough.  No abdominal pain or diarrhea.  He did state that he felt generally weak and unwell and even had a little trouble walking right before he got here.  He denied any unilateral numbness or weakness.  No speech problems.  Denies nausea at the time.  No known sick contacts.  He does have a history of COPD as well and reports that he uses inhalers infrequently as needed he has not used any today.  He denies any pain or swelling in his legs.  Had a flu shot about 2 weeks ago.  The history is provided by the patient.       Home Medications Prior to Admission medications   Medication Sig Start Date End Date Taking? Authorizing Provider  cetirizine (ZYRTEC ALLERGY) 10 MG tablet Take one tablet nightly for allergies. 07/16/19   Elder Negus, NP  Cholecalciferol (VITAMIN D) 2000 UNITS CAPS Take 2 capsules by mouth daily.     [provider]  diltiazem (DILACOR XR) 240 MG 24 hr capsule TAKE 1 CAPSULE DAILY FOR BLOOD PRESSURE AND HEART RHYTHM 08/16/21   Judd Gaudier, NP  Fluticasone-Umeclidin-Vilant (TRELEGY ELLIPTA) 200-62.5-25 MCG/ACT AEPB Use  1 Inhalation  Daily  for COPD /Asthma 07/11/21   Adela Glimpse, NP  gabapentin  (NEURONTIN) 300 MG capsule Take 1 capsule 1 to 2 hours before Bedtime if needed for Sleep 01/11/23   [name], MD  glipiZIDE (GLUCOTROL) 5 MG tablet Take 1/2 to 1 tablet 2 x /day with meals for Diabetes 01/12/23   [name], MD  losartan (COZAAR) 100 MG tablet TAKE 1 TABLET EVERY DAY FOR BLOOD PRESSURE 01/01/23   Raynelle Dick, NP  magnesium gluconate (MAGONATE) 500 MG tablet Take 500 mg by mouth daily.     [provider]  montelukast (SINGULAIR) 10 MG tablet Take  1 tablet Daily for Allergies                                /                                                                   TAKE                                         BY  MOUTH 08/26/22   [name], MD  rosuvastatin (CRESTOR) 40 MG tablet TAKE 1 TABLET DAILY FOR CHOLESTEROL 03/10/22   [name], MD      Allergies    Ppd [tuberculin purified protein derivative]    Review of Systems   Review of Systems  Physical Exam Updated Vital Signs BP 103/60   Pulse (!) 117   Temp 99 F (37.2 C) (Oral)   Resp 17   Ht 5\' 9"  (1.753 m)   Wt 74.8 kg   SpO2 94%   BMI 24.37 kg/m  Physical Exam Vitals and nursing note reviewed.  Constitutional:      General: He is not in acute distress.    Appearance: He is well-developed.  HENT:     Head: Normocephalic and atraumatic.     Mouth/Throat:     Mouth: Mucous membranes are dry.  Eyes:     Conjunctiva/sclera: Conjunctivae normal.     Pupils: Pupils are equal, round, and reactive to light.  Cardiovascular:     Rate and Rhythm: Regular rhythm. Tachycardia present.     Heart sounds: No murmur heard. Pulmonary:     Effort: Pulmonary effort is normal. No respiratory distress.     Breath sounds: Wheezing present. No rales.  Abdominal:     General: There is no distension.     Palpations: Abdomen is soft.     Tenderness: There is no abdominal tenderness. There is no guarding or rebound.   Musculoskeletal:        General: No tenderness. Normal range of motion.     Cervical back: Normal range of motion and neck supple.  Skin:    General: Skin is warm and dry.     Findings: No erythema or rash.     Comments: Hot to the touch  Neurological:     Mental Status: He is alert and oriented to person, place, and time. Mental status is at baseline.     Sensory: No sensory deficit.     Motor: No weakness.  Psychiatric:        Behavior: Behavior normal.     ED Results / Procedures / Treatments   Labs (all labs ordered are listed, but only abnormal results are displayed) Labs Reviewed  COMPREHENSIVE METABOLIC PANEL - Abnormal; Notable for the following components:      Result Value   Glucose, Bld 132 (*)    Creatinine, Ser 1.47 (*)    GFR, Estimated 49 (*)    All other components within normal limits  CBC WITH DIFFERENTIAL/PLATELET - Abnormal; Notable for the following components:   WBC 19.0 (*)    RBC 4.20 (*)    Hemoglobin 12.8 (*)    HCT 38.1 (*)    Neutro Abs 16.3 (*)    Monocytes Absolute 1.5 (*)    Abs Immature Granulocytes 0.13 (*)    All other components within normal limits  URINALYSIS, W/ REFLEX TO CULTURE (INFECTION SUSPECTED) - Abnormal; Notable for the following components:   Protein, ur 30 (*)    All other components within normal limits  RESP PANEL BY RT-PCR (RSV, FLU A&B, COVID)  RVPGX2  CULTURE, BLOOD (ROUTINE X 2)  CULTURE, BLOOD (ROUTINE X 2)  LACTIC ACID, PLASMA  PROTIME-INR  APTT  LACTIC ACID, PLASMA    EKG EKG Interpretation Date/Time:  Wednesday February 13 2023 16:35:14 EST Ventricular Rate:  133 PR Interval:  126 QRS Duration:  76 QT Interval:  282 QTC Calculation: 419 R Axis:  25  Text Interpretation: Sinus tachycardia Nonspecific ST abnormality No significant change since last tracing When compared with ECG of 01-Oct-2022 11:18, Premature ventricular complexes are no longer Present Vent. rate has increased BY  56 BPM Confirmed by  Gwyneth Sprout ([id number]) on 02/13/2023 5:04:46 PM  Radiology No results found.  Procedures Procedures    Medications Ordered in ED Medications  cefTRIAXone (ROCEPHIN) 2 g in sodium chloride 0.9 % 100 mL IVPB (0 g Intravenous Stopped 02/13/23 1853)  azithromycin (ZITHROMAX) 500 mg in sodium chloride 0.9 % 250 mL IVPB (500 mg Intravenous New Bag/Given 02/13/23 1832)  lactated ringers bolus 500 mL (0 mLs Intravenous Stopped 02/13/23 1853)  albuterol (PROVENTIL) (2.5 MG/3ML) 0.083% nebulizer solution 5 mg (5 mg Nebulization Given 02/13/23 1748)  ipratropium (ATROVENT) nebulizer solution 0.5 mg (0.5 mg Nebulization Given 02/13/23 1750)  acetaminophen (TYLENOL) tablet 1,000 mg (1,000 mg Oral Given 02/13/23 1741)    ED Course/ Medical Decision Making/ A&P                                 Medical Decision Making Amount and/or Complexity of Data Reviewed Independent Historian: spouse External Data Reviewed: notes. Labs: ordered. Decision-making details documented in ED Course. Radiology: ordered and independent interpretation performed. Decision-making details documented in ED Course. ECG/medicine tests: ordered and independent interpretation performed. Decision-making details documented in ED Course.  Risk OTC drugs. Prescription drug management. Decision regarding hospitalization.   Pt with multiple medical problems and comorbidities and presenting today with a complaint that caries a high risk for morbidity and mortality.  Here today with signs of sepsis with a temperature of 102.9, tachycardia with a heart rate between 120-130 and intermittent tachypnea.  Patient does report cough with this but is been ongoing for the last 36 hours intermittently.  Patient is awake and alert at this time.  He is in no acute distress.  Concern for pneumonia, viral etiology such as flu or COVID.  No evidence of cellulitis.  Patient is having no abdominal pain concerning for an acute abdominal process.   He denies any urinary process making UTI or Pyelo nephritis less likely.  Patient's symptoms are not classic for cholecystitis.  Patient is wheezing on exam and will give albuterol and Atrovent as suspect his COPD is being mildly exacerbated by fever today.  Sepsis protocol initiated.  Patient given azithromycin and Rocephin due to concern for a pulmonary source.  He is also given IV fluids and fever control.  8:08 PM I independently interpreted patient's labs and EKG.  EKG with sinus tachycardia but no dysrhythmia at this time.  CBC with leukocytosis of 19, UA and coags are within normal limits, viral panel is negative, CMP with mild AKI today with creatinine of 1.47 from his baseline at 1 but electrolytes within normal limits.  Lactic acid is normal.  I have independently visualized and interpreted pt's images today.  Chest x-ray today with no evidence of pneumothorax, no significant consolidation but may be some minimal increased infiltrates.  Patient covered with Rocephin and azithromycin.  After albuterol and Atrovent wheezing has improved.  Patient is on continuous cardiac monitoring which I independently interpreted and remains in sinus rhythm.  Tachycardia has improved with fever defervesced since now in the 100s.  Feel that patient needs admission.  O2 sats remain rater than 90 on room air.  Discussed the findings with the patient and his family and they are comfortable with  this plan.          Final Clinical Impression(s) / ED Diagnoses Final diagnoses:  Sepsis with acute renal failure without septic shock, due to unspecified organism, unspecified acute renal failure type [facility])  Community acquired pneumonia, unspecified laterality    Rx / DC Orders ED Discharge Orders     None         Gwyneth Sprout, MD 02/13/23 2009

## 2023-02-13 NOTE — Progress Notes (Signed)
[facility] is following code sepsis 

## 2023-02-13 NOTE — ED Notes (Signed)
Blood cultures drawn before start of antibotics

## 2023-02-13 NOTE — Assessment & Plan Note (Signed)
-   Elevated creatinine of 1.47 from prior 1.03 - Will keep on continuous IV fluids overnight

## 2023-02-13 NOTE — ED Notes (Signed)
[facility] at bedside 

## 2023-02-13 NOTE — H&P (Incomplete)
History and Physical    Patient: Jesus Bush:[id number] DOB: Nov 18, 1945 DOA: 02/13/2023 DOS: the patient was seen and examined on 02/14/2023 PCP: [name], MD  Patient coming from: Home  Chief Complaint:  Chief Complaint  Patient presents with  . Emesis   HPI: [name] is a 77 y.o. male with medical history significant of hypertension, paroxysmal atrial fibrillation not on anticoagulation, s/p robotic assisted thoracoscopy with left upper lobe wedge resection, hypothyroidism, COPD who was sent in from primary care office with concerns of sepsis with symptoms of cough and body ache.  Patient reports symptoms of cough, body ache, nausea and vomiting started today.  No shortness of breath.  Has had sick contact.  Patient has previous history of tobacco use but quit 8 months ago.  On arrival to the ED he was febrile to 102.46F tachycardic with heart rate of 130 tachypneic with RR 25, mildly elevated blood pressure 140/72 but remained stable on room air.  CBC with leukocytosis of 19K, hemoglobin of 12.8.  Lactate within normal limits.   CMP notable for AKI with elevated creatinine of 1.47 from prior 1.03.  LFTs were negative.  Chest x-ray with diffuse interstitial reticulonodular density may represent atypical/viral infection.  PCR for flu/RSV/COVID were negative.  He was started on IV Rocephin and azithromycin and transferred here from outside ED for further management of pneumonia.  Review of Systems: As mentioned in the history of present illness. All other systems reviewed and are negative. Past Medical History:  Diagnosis Date  . Diabetes mellitus without complication (HCC)   . Hyperlipidemia   . Hypertension   . Vitamin D deficiency    Past Surgical History:  Procedure Laterality Date  . CATARACT EXTRACTION, BILATERAL Bilateral 2018   Dr. Wayland Denis  . INTERCOSTAL NERVE BLOCK  08/27/2022   Procedure: INTERCOSTAL NERVE BLOCK;  Surgeon: [name], MD;  Location: [facility] OR;  Service: Thoracic;;   Social History:  reports that he quit smoking about 8 months ago. His smoking use included cigarettes. He started smoking about 37 years ago. He has a 44 pack-year smoking history. He has never used smokeless tobacco. He reports that he does not drink alcohol and does not use drugs.  Allergies  Allergen Reactions  . Ppd [Tuberculin Purified Protein Derivative]     Positive PPD.    Family History  Problem Relation Age of Onset  . Stroke Mother   . Heart disease Father   . Heart attack Father   . Diabetes Sister   . Heart disease Sister   . Heart attack Brother   . Hypertension Brother   . Colon cancer Neg Hx   . Stomach cancer Neg Hx   . Esophageal cancer Neg Hx     Prior to Admission medications   Medication Sig Start Date End Date Taking? Authorizing Provider  cetirizine (ZYRTEC ALLERGY) 10 MG tablet Take one tablet nightly for allergies. 07/16/19   Elder Negus, NP  Cholecalciferol (VITAMIN D) 2000 UNITS CAPS Take 2 capsules by mouth daily.     [provider]  diltiazem (DILACOR XR) 240 MG 24 hr capsule TAKE 1 CAPSULE DAILY FOR BLOOD PRESSURE AND HEART RHYTHM 08/16/21   Judd Gaudier, NP  Fluticasone-Umeclidin-Vilant (TRELEGY ELLIPTA) 200-62.5-25 MCG/ACT AEPB Use  1 Inhalation  Daily  for COPD /Asthma 07/11/21   Adela Glimpse, NP  gabapentin (NEURONTIN) 300 MG capsule Take 1 capsule 1 to 2 hours before Bedtime if needed for Sleep 01/11/23  [name], MD  glipiZIDE (GLUCOTROL) 5 MG tablet Take 1/2 to 1 tablet 2 x /day with meals for Diabetes 01/12/23   [name], MD  losartan (COZAAR) 100 MG tablet TAKE 1 TABLET EVERY DAY FOR BLOOD PRESSURE 01/01/23   Raynelle Dick, NP  magnesium gluconate (MAGONATE) 500 MG tablet Take 500 mg by mouth daily.     [provider]  montelukast (SINGULAIR) 10 MG tablet Take  1 tablet Daily for Allergies                                /                                                                    TAKE                                         BY                                                 MOUTH 08/26/22   [name], MD  rosuvastatin (CRESTOR) 40 MG tablet TAKE 1 TABLET DAILY FOR CHOLESTEROL 03/10/22   [name], MD    Physical Exam: Vitals:   02/13/23 2100 02/13/23 2115 02/13/23 2130 02/13/23 2241  BP: 104/65 (!) 116/59 125/63 (!) 134/58  Pulse: (!) 101 99 (!) 101 (!) 101  Resp: (!) 22 (!) 24 19 (!) 24  Temp:    97.9 F (36.6 C)  TempSrc:    Oral  SpO2: 95% 94% 93% 99%  Weight:      Height:       Constitutional: NAD, calm, comfortable Eyes: PERRL, lids and conjunctivae normal ENMT: Mucous membranes are moist. Posterior pharynx clear of any exudate or lesions.Normal dentition.  Neck: normal, supple, no masses, no thyromegaly Respiratory: clear to auscultation bilaterally, no wheezing, no crackles. Normal respiratory effort. No accessory muscle use.  Cardiovascular: Regular rate and rhythm, no murmurs / rubs / gallops. No extremity edema. 2+ pedal pulses. No carotid bruits.  Abdomen: no tenderness, no masses palpated. No hepatosplenomegaly. Bowel sounds positive.  Musculoskeletal: no clubbing / cyanosis. No joint deformity upper and lower extremities. Good ROM, no contractures. Normal muscle tone.  Skin: no rashes, lesions, ulcers. No induration Neurologic: CN 2-12 grossly intact. Sensation intact, DTR normal. Strength 5/5 in all 4.  Psychiatric: Normal judgment and insight. Alert and oriented x 3. Normal mood. Data Reviewed: {Tip this will not be part of the note when signed- Document your independent interpretation of telemetry tracing, EKG, lab, Radiology test or any other diagnostic tests. Add any new diagnostic test ordered today. (Optional):[id number]} See HPI  Assessment and Plan: * Sepsis (HCC) - Secondary to pneumonia - Patient presented with fever, tachycardia, tachypnea and leukocytosis with chest x-ray finding  of atypical possible viral pneumonia - Has been started on IV Rocephin and azithromycin and will continue - Will obtain respiratory viral panel  AKI (acute kidney injury) (  HCC) - Elevated creatinine of 1.47 from prior 1.03 - Will keep on continuous IV fluids overnight  Paroxysmal atrial fibrillation (HCC) - This was provoked following a pneumothorax in the past.  Cardiology has cleared him from taking Eliquis.  COPD (chronic obstructive pulmonary disease) (HCC) - Not in acute exacerbation - Patient has former tobacco use of half pack per day for up to 30 years but quit 8 months ago.  Mixed hyperlipidemia - Continue statin  Essential hypertension - Controlled.  Patient unable to verify his antihypertensive with me overnight.      Advance Care Planning: Full  Consults: None  Family Communication: None at bedside  Severity of Illness: The appropriate patient status for this patient is OBSERVATION. Observation status is judged to be reasonable and necessary in order to provide the required intensity of service to ensure the patient's safety. The patient's presenting symptoms, physical exam findings, and initial radiographic and laboratory data in the context of their medical condition is felt to place them at decreased risk for further clinical deterioration. Furthermore, it is anticipated that the patient will be medically stable for discharge from the hospital within 2 midnights of admission.   Author: Anselm Jungling, DO 02/14/2023 12:03 AM  For on call review [email or web address].

## 2023-02-13 NOTE — H&P (Signed)
History and Physical    Patient: Jesus Bush WJX:[id number] DOB: 09-26-1945 DOA: 02/13/2023 DOS: the patient was seen and examined on 02/14/2023 PCP: [name], MD  Patient coming from: Home  Chief Complaint:  Chief Complaint  Patient presents with   Emesis   HPI: Jesus Bush is a 77 y.o. male with medical history significant of hypertension, paroxysmal atrial fibrillation not on anticoagulation, s/p robotic assisted thoracoscopy with left upper lobe wedge resection, hypothyroidism, COPD who was sent in from primary care office with concerns of sepsis with symptoms of cough and body ache.  Patient reports symptoms of cough, body ache, nausea and vomiting started today.  No shortness of breath.  Has had sick contact.  Patient has previous history of tobacco use but quit 8 months ago.  On arrival to the ED he was febrile to 102.19F tachycardic with heart rate of 130 tachypneic with RR 25, mildly elevated blood pressure 140/72 but remained stable on room air.  CBC with leukocytosis of 19K, hemoglobin of 12.8.  Lactate within normal limits.   CMP notable for AKI with elevated creatinine of 1.47 from prior 1.03.  LFTs were negative.  Chest x-ray with diffuse interstitial reticulonodular density may represent atypical/viral infection.  PCR for flu/RSV/COVID were negative.  He was started on IV Rocephin and azithromycin and transferred here from outside ED for further management of pneumonia.  Review of Systems: As mentioned in the history of present illness. All other systems reviewed and are negative. Past Medical History:  Diagnosis Date   Diabetes mellitus without complication (HCC)    Hyperlipidemia    Hypertension    Vitamin D deficiency    Past Surgical History:  Procedure Laterality Date   CATARACT EXTRACTION, BILATERAL Bilateral 2018   Dr. Wayland Denis   INTERCOSTAL NERVE BLOCK  08/27/2022   Procedure: INTERCOSTAL NERVE BLOCK;  Surgeon: [name],  MD;  Location: [facility] OR;  Service: Thoracic;;   Social History:  reports that he quit smoking about 8 months ago. His smoking use included cigarettes. He started smoking about 37 years ago. He has a 44 pack-year smoking history. He has never used smokeless tobacco. He reports that he does not drink alcohol and does not use drugs.  Allergies  Allergen Reactions   Ppd [Tuberculin Purified Protein Derivative]     Positive PPD.    Family History  Problem Relation Age of Onset   Stroke Mother    Heart disease Father    Heart attack Father    Diabetes Sister    Heart disease Sister    Heart attack Brother    Hypertension Brother    Colon cancer Neg Hx    Stomach cancer Neg Hx    Esophageal cancer Neg Hx     Prior to Admission medications   Medication Sig Start Date End Date Taking? Authorizing Provider  cetirizine (ZYRTEC ALLERGY) 10 MG tablet Take one tablet nightly for allergies. 07/16/19   Elder Negus, NP  Cholecalciferol (VITAMIN D) 2000 UNITS CAPS Take 2 capsules by mouth daily.     [provider]  diltiazem (DILACOR XR) 240 MG 24 hr capsule TAKE 1 CAPSULE DAILY FOR BLOOD PRESSURE AND HEART RHYTHM 08/16/21   Judd Gaudier, NP  Fluticasone-Umeclidin-Vilant (TRELEGY ELLIPTA) 200-62.5-25 MCG/ACT AEPB Use  1 Inhalation  Daily  for COPD /Asthma 07/11/21   Adela Glimpse, NP  gabapentin (NEURONTIN) 300 MG capsule Take 1 capsule 1 to 2 hours before Bedtime if needed for Sleep 01/11/23  [name], MD  glipiZIDE (GLUCOTROL) 5 MG tablet Take 1/2 to 1 tablet 2 x /day with meals for Diabetes 01/12/23   [name], MD  losartan (COZAAR) 100 MG tablet TAKE 1 TABLET EVERY DAY FOR BLOOD PRESSURE 01/01/23   Raynelle Dick, NP  magnesium gluconate (MAGONATE) 500 MG tablet Take 500 mg by mouth daily.     [provider]  montelukast (SINGULAIR) 10 MG tablet Take  1 tablet Daily for Allergies                                /                                                                    TAKE                                         BY                                                 MOUTH 08/26/22   [name], MD  rosuvastatin (CRESTOR) 40 MG tablet TAKE 1 TABLET DAILY FOR CHOLESTEROL 03/10/22   [name], MD    Physical Exam: Vitals:   02/13/23 2100 02/13/23 2115 02/13/23 2130 02/13/23 2241  BP: 104/65 (!) 116/59 125/63 (!) 134/58  Pulse: (!) 101 99 (!) 101 (!) 101  Resp: (!) 22 (!) 24 19 (!) 24  Temp:    97.9 F (36.6 C)  TempSrc:    Oral  SpO2: 95% 94% 93% 99%  Weight:      Height:       Constitutional: NAD, calm, comfortable, nontoxic well-appearing elderly gentleman sitting upright in bed Eyes: lids and conjunctivae normal ENMT: Mucous membranes are moist. Neck: normal, supple Respiratory: Rhonchorous lung sounds to bilateral upper lobes.  Normal respiratory effort. No accessory muscle use.  On room air. Cardiovascular: Regular rate and rhythm, no murmurs / rubs / gallops. No extremity edema.   Abdomen: Soft, nontender nondistended.   Musculoskeletal: no clubbing / cyanosis. No joint deformity upper and lower extremities. Good ROM, no contractures. Normal muscle tone.  Skin: no rashes, lesions, ulcers. No induration Neurologic: CN 2-12 grossly intact.  Psychiatric: Normal judgment and insight. Alert and oriented x 3. Normal mood. Data Reviewed:  See HPI  Assessment and Plan: * Sepsis (HCC) - Secondary to pneumonia - Patient presented with fever, tachycardia, tachypnea and leukocytosis with chest x-ray finding of atypical possible viral pneumonia - Has been started on IV Rocephin and azithromycin and will continue - Will obtain respiratory viral panel  AKI (acute kidney injury) (HCC) - Elevated creatinine of 1.47 from prior 1.03 - Will keep on continuous IV fluids overnight  Paroxysmal atrial fibrillation (HCC) - This was provoked following a pneumothorax in the past.  Cardiology has cleared him from taking  Eliquis.  COPD (chronic obstructive pulmonary disease) (HCC) - Not in acute exacerbation - Patient has former tobacco use  of half pack per day for up to 30 years but quit 8 months ago.  Mixed hyperlipidemia - Continue statin  Essential hypertension - Controlled.  Patient unable to verify his antihypertensive with me overnight.      Advance Care Planning: Full  Consults: None  Family Communication: None at bedside  Severity of Illness: The appropriate patient status for this patient is OBSERVATION. Observation status is judged to be reasonable and necessary in order to provide the required intensity of service to ensure the patient's safety. The patient's presenting symptoms, physical exam findings, and initial radiographic and laboratory data in the context of their medical condition is felt to place them at decreased risk for further clinical deterioration. Furthermore, it is anticipated that the patient will be medically stable for discharge from the hospital within 2 midnights of admission.   Author: Anselm Jungling, DO 02/14/2023 12:03 AM  For on call review [email or web address].

## 2023-02-13 NOTE — Progress Notes (Signed)
Assessment and Plan:  Jesus Bush was seen today for acute visit.  Diagnoses and all orders for this visit:  Essential hypertension - continue medications, DASH diet, exercise and monitor at home. Call if greater than 130/80.   Chronic obstructive pulmonary disease, unspecified COPD type (HCC) Continue current meds  Tachycardia/Acute respiratory distress/Fever, unspecified fever cause Due to history of spontaneous pneumothorax, pneumonia sent to ER for further evaluation to also rule out PE, pneumonia, spontaneous PE Offered ambulance but pt declines- wife driving to ER      Further disposition pending results of labs. Discussed med's effects and SE's.   Over 30 minutes of exam, counseling, chart review, and critical decision making was performed.   Future Appointments  Date Time Provider Department Center  05/15/2023 11:00 AM Adela Glimpse, NP [facility] None  08/15/2023 11:30 AM [name], MD [facility] None  11/18/2023 11:30 AM Adela Glimpse, NP [facility] None  02/24/2024 11:00 AM [name], MD [facility] None    ------------------------------------------------------------------------------------------------------------------   HPI BP 112/60   Pulse (!) 137   Temp (!) 101.5 F (38.6 C)   Ht 5\' 9"  (1.753 m)   Wt 168 lb 3.2 oz (76.3 kg)   SpO2 92%   BMI 24.84 kg/m  77 y.o.male presents for Fatigue, nausea, shortness of breath, productive cough, muscle aches that started today.   BP well controlled with diltiazem 240 mg every day and Losartan 100 mg every day  BP Readings from Last 3 Encounters:  02/13/23 112/60  01/11/23 136/70  01/03/23 128/72  Denies headaches, chest pain, shortness of breath and dizziness    Pulse Readings from Last 3 Encounters:  02/13/23 (!) 137  01/11/23 (!) 56  01/03/23 72     Past Medical History:  Diagnosis Date   Diabetes mellitus without complication (HCC)    Hyperlipidemia    Hypertension    Vitamin D deficiency       Allergies  Allergen Reactions   Ppd [Tuberculin Purified Protein Derivative]     Positive PPD.    Current Outpatient Medications on File Prior to Visit  Medication Sig   cetirizine (ZYRTEC ALLERGY) 10 MG tablet Take one tablet nightly for allergies.   Cholecalciferol (VITAMIN D) 2000 UNITS CAPS Take 2 capsules by mouth daily.    diltiazem (DILACOR XR) 240 MG 24 hr capsule TAKE 1 CAPSULE DAILY FOR BLOOD PRESSURE AND HEART RHYTHM   Fluticasone-Umeclidin-Vilant (TRELEGY ELLIPTA) 200-62.5-25 MCG/ACT AEPB Use  1 Inhalation  Daily  for COPD /Asthma   gabapentin (NEURONTIN) 300 MG capsule Take 1 capsule 1 to 2 hours before Bedtime if needed for Sleep   glipiZIDE (GLUCOTROL) 5 MG tablet Take 1/2 to 1 tablet 2 x /day with meals for Diabetes   losartan (COZAAR) 100 MG tablet TAKE 1 TABLET EVERY DAY FOR BLOOD PRESSURE   magnesium gluconate (MAGONATE) 500 MG tablet Take 500 mg by mouth daily.    montelukast (SINGULAIR) 10 MG tablet Take  1 tablet Daily for Allergies                                /  TAKE                                         BY                                                 MOUTH   rosuvastatin (CRESTOR) 40 MG tablet TAKE 1 TABLET DAILY FOR CHOLESTEROL   azithromycin (ZITHROMAX) 250 MG tablet Take 2 tablets with Food on  Day 1, then 1 tablet Daily with Food for Sinusitis / Bronchitis (Patient not taking: Reported on 02/13/2023)   dexamethasone (DECADRON) 4 MG tablet Take 1 tab 3 x day - 3 days, then 2 x day - 3 days, then 1 tab daily (Patient not taking: Reported on 02/13/2023)   promethazine-dextromethorphan (PROMETHAZINE-DM) 6.25-15 MG/5ML syrup Take 1 tsp every 4 hours if needed for cough (Patient not taking: Reported on 02/13/2023)   No current facility-administered medications on file prior to visit.    ROS: all negative except above.   Physical Exam:  BP 112/60   Pulse (!) 137   Temp (!) 101.5 F  (38.6 C)   Ht 5\' 9"  (1.753 m)   Wt 168 lb 3.2 oz (76.3 kg)   SpO2 92%   BMI 24.84 kg/m   General Appearance: Diaphoretic Eyes: PERRLA, EOMs, conjunctiva no swelling or erythema Neck: Supple, thyroid normal.  Respiratory: Respiratory effort labored Cardio:  Tachycardia Musculoskeletal: Full ROM, 5/5 strength, normal gait.  Skin: Diaphoretic  Psych: Awake and oriented X 3, flat affect,      Jesus Turgeon Hollie Salk, NP 3:54 PM [facility]

## 2023-02-13 NOTE — Assessment & Plan Note (Signed)
-   Not in acute exacerbation - Patient has former tobacco use of half pack per day for up to 30 years but quit 8 months ago.

## 2023-02-14 ENCOUNTER — Encounter (HOSPITAL_COMMUNITY): Payer: Self-pay

## 2023-02-14 DIAGNOSIS — Z833 Family history of diabetes mellitus: Secondary | ICD-10-CM | POA: Diagnosis not present

## 2023-02-14 DIAGNOSIS — A419 Sepsis, unspecified organism: Secondary | ICD-10-CM

## 2023-02-14 DIAGNOSIS — Z8249 Family history of ischemic heart disease and other diseases of the circulatory system: Secondary | ICD-10-CM | POA: Diagnosis not present

## 2023-02-14 DIAGNOSIS — N179 Acute kidney failure, unspecified: Secondary | ICD-10-CM | POA: Diagnosis present

## 2023-02-14 DIAGNOSIS — R6520 Severe sepsis without septic shock: Secondary | ICD-10-CM | POA: Diagnosis present

## 2023-02-14 DIAGNOSIS — E782 Mixed hyperlipidemia: Secondary | ICD-10-CM | POA: Diagnosis present

## 2023-02-14 DIAGNOSIS — Z1152 Encounter for screening for COVID-19: Secondary | ICD-10-CM | POA: Diagnosis not present

## 2023-02-14 DIAGNOSIS — I480 Paroxysmal atrial fibrillation: Secondary | ICD-10-CM | POA: Diagnosis present

## 2023-02-14 DIAGNOSIS — Z9841 Cataract extraction status, right eye: Secondary | ICD-10-CM | POA: Diagnosis not present

## 2023-02-14 DIAGNOSIS — Z87891 Personal history of nicotine dependence: Secondary | ICD-10-CM | POA: Diagnosis not present

## 2023-02-14 DIAGNOSIS — Z7984 Long term (current) use of oral hypoglycemic drugs: Secondary | ICD-10-CM | POA: Diagnosis not present

## 2023-02-14 DIAGNOSIS — I10 Essential (primary) hypertension: Secondary | ICD-10-CM | POA: Diagnosis present

## 2023-02-14 DIAGNOSIS — J440 Chronic obstructive pulmonary disease with acute lower respiratory infection: Secondary | ICD-10-CM | POA: Diagnosis present

## 2023-02-14 DIAGNOSIS — J189 Pneumonia, unspecified organism: Secondary | ICD-10-CM | POA: Diagnosis present

## 2023-02-14 DIAGNOSIS — Z823 Family history of stroke: Secondary | ICD-10-CM | POA: Diagnosis not present

## 2023-02-14 DIAGNOSIS — E039 Hypothyroidism, unspecified: Secondary | ICD-10-CM | POA: Diagnosis present

## 2023-02-14 DIAGNOSIS — R7989 Other specified abnormal findings of blood chemistry: Secondary | ICD-10-CM | POA: Diagnosis present

## 2023-02-14 DIAGNOSIS — E119 Type 2 diabetes mellitus without complications: Secondary | ICD-10-CM | POA: Diagnosis present

## 2023-02-14 DIAGNOSIS — Z9842 Cataract extraction status, left eye: Secondary | ICD-10-CM | POA: Diagnosis not present

## 2023-02-14 DIAGNOSIS — Z79899 Other long term (current) drug therapy: Secondary | ICD-10-CM | POA: Diagnosis not present

## 2023-02-14 LAB — CBC
HCT: 32.3 % — ABNORMAL LOW (ref 39.0–52.0)
Hemoglobin: 10.6 g/dL — ABNORMAL LOW (ref 13.0–17.0)
MCH: 30.9 pg (ref 26.0–34.0)
MCHC: 32.8 g/dL (ref 30.0–36.0)
MCV: 94.2 fL (ref 80.0–100.0)
Platelets: 234 10*3/uL (ref 150–400)
RBC: 3.43 MIL/uL — ABNORMAL LOW (ref 4.22–5.81)
RDW: 13.7 % (ref 11.5–15.5)
WBC: 17.2 10*3/uL — ABNORMAL HIGH (ref 4.0–10.5)
nRBC: 0 % (ref 0.0–0.2)

## 2023-02-14 LAB — BASIC METABOLIC PANEL
Anion gap: 8 (ref 5–15)
BUN: 17 mg/dL (ref 8–23)
CO2: 24 mmol/L (ref 22–32)
Calcium: 8.8 mg/dL — ABNORMAL LOW (ref 8.9–10.3)
Chloride: 106 mmol/L (ref 98–111)
Creatinine, Ser: 1.08 mg/dL (ref 0.61–1.24)
GFR, Estimated: >60 mL/min (ref >60)
Glucose, Bld: 105 mg/dL — ABNORMAL HIGH (ref 70–99)
Potassium: 3.6 mmol/L (ref 3.5–5.1)
Sodium: 138 mmol/L (ref 135–145)

## 2023-02-14 LAB — RESPIRATORY PANEL BY PCR

## 2023-02-14 LAB — LACTIC ACID, PLASMA
Lactic Acid, Venous: 2 mmol/L (ref 0.5–1.9)
Lactic Acid, Venous: 1.1 mmol/L (ref 0.5–1.9)

## 2023-02-14 MED ORDER — LACTATED RINGERS IV SOLN: INTRAVENOUS | Status: AC

## 2023-02-14 MED ORDER — LORATADINE 10 MG PO TABS
10.0000 mg | ORAL_TABLET | Freq: Every day | ORAL | Status: DC
  Administered 2023-02-14: 10 mg via ORAL
  Filled 2023-02-14: qty 1

## 2023-02-14 MED ORDER — IPRATROPIUM-ALBUTEROL 0.5-2.5 (3) MG/3ML IN SOLN: 3.0000 mL | RESPIRATORY_TRACT | Status: DC | PRN

## 2023-02-14 MED ORDER — UMECLIDINIUM BROMIDE 62.5 MCG/ACT IN AEPB
1.0000 | INHALATION_SPRAY | Freq: Every day | RESPIRATORY_TRACT | Status: DC
  Administered 2023-02-15: 1 via RESPIRATORY_TRACT
  Filled 2023-02-14: qty 7

## 2023-02-14 MED ORDER — ROSUVASTATIN CALCIUM 20 MG PO TABS
40.0000 mg | ORAL_TABLET | Freq: Every day | ORAL | Status: DC
  Administered 2023-02-14 – 2023-02-15 (×2): 40 mg via ORAL
  Filled 2023-02-14 (×2): qty 2

## 2023-02-14 MED ORDER — LACTATED RINGERS IV BOLUS
500.0000 mL | Freq: Once | INTRAVENOUS | Status: AC
  Administered 2023-02-14: 500 mL via INTRAVENOUS

## 2023-02-14 MED ORDER — FLUTICASONE FUROATE-VILANTEROL 200-25 MCG/ACT IN AEPB
1.0000 | INHALATION_SPRAY | Freq: Every day | RESPIRATORY_TRACT | Status: DC
  Administered 2023-02-15: 1 via RESPIRATORY_TRACT
  Filled 2023-02-14: qty 28

## 2023-02-14 MED ORDER — FAMOTIDINE 20 MG PO TABS
40.0000 mg | ORAL_TABLET | Freq: Every day | ORAL | Status: DC
  Administered 2023-02-14 – 2023-02-15 (×2): 40 mg via ORAL
  Filled 2023-02-14 (×2): qty 2

## 2023-02-14 MED ORDER — CALCIUM CARBONATE ANTACID 500 MG PO CHEW
1.0000 | CHEWABLE_TABLET | Freq: Two times a day (BID) | ORAL | Status: DC | PRN
  Administered 2023-02-14: 200 mg via ORAL
  Filled 2023-02-14: qty 1

## 2023-02-14 MED ORDER — MELATONIN 5 MG PO TABS
5.0000 mg | ORAL_TABLET | Freq: Every evening | ORAL | Status: DC | PRN
  Administered 2023-02-14: 5 mg via ORAL
  Filled 2023-02-14: qty 1

## 2023-02-14 MED ORDER — DILTIAZEM HCL ER COATED BEADS 240 MG PO CP24
240.0000 mg | ORAL_CAPSULE | Freq: Every day | ORAL | Status: DC
  Administered 2023-02-14 – 2023-02-15 (×2): 240 mg via ORAL
  Filled 2023-02-14 (×2): qty 1

## 2023-02-14 MED ORDER — PANTOPRAZOLE SODIUM 40 MG IV SOLR
40.0000 mg | Freq: Once | INTRAVENOUS | Status: AC
  Administered 2023-02-14: 40 mg via INTRAVENOUS
  Filled 2023-02-14: qty 10

## 2023-02-14 MED ORDER — GUAIFENESIN-DM 100-10 MG/5ML PO SYRP
5.0000 mL | ORAL_SOLUTION | ORAL | Status: DC | PRN
  Administered 2023-02-14: 5 mL via ORAL
  Filled 2023-02-14: qty 10

## 2023-02-14 MED ORDER — ORAL CARE MOUTH RINSE: 15.0000 mL | OROMUCOSAL | Status: DC | PRN

## 2023-02-14 NOTE — Progress Notes (Signed)
Mobility Specialist - Progress Note   02/14/23 1411  Mobility  Activity Ambulated independently in hallway  Level of Assistance Independent  Assistive Device None  Distance Ambulated (ft) 350 ft  Activity Response Tolerated well  Mobility Referral Yes  $Mobility charge 1 Mobility  Mobility Specialist Start Time (ACUTE ONLY) 1406  Mobility Specialist Stop Time (ACUTE ONLY) 1411  Mobility Specialist Time Calculation (min) (ACUTE ONLY) 5 min   Pt received in room standing and agreeable to mobility. No complaints during session. Pt to room standing after session with all needs met.    [facility]

## 2023-02-14 NOTE — Plan of Care (Signed)
  Problem: Education: Goal: Knowledge of General Education information will improve Description: Including pain rating scale, medication(s)/side effects and non-pharmacologic comfort measures Outcome: Progressing   Problem: Activity: Goal: Risk for activity intolerance will decrease Outcome: Progressing   Problem: Nutrition: Goal: Adequate nutrition will be maintained Outcome: Progressing   Problem: Coping: Goal: Level of anxiety will decrease Outcome: Progressing   Problem: Elimination: Goal: Will not experience complications related to urinary retention Outcome: Progressing   Problem: Pain Management: Goal: General experience of comfort will improve Outcome: Progressing   Problem: Safety: Goal: Ability to remain free from injury will improve Outcome: Progressing   Problem: Skin Integrity: Goal: Risk for impaired skin integrity will decrease Outcome: Progressing

## 2023-02-14 NOTE — Progress Notes (Signed)
   02/14/23 0703  Assess: MEWS Score  Temp 98.7 F (37.1 C)  BP 125/60  MAP (mmHg) 78  Pulse Rate (!) 101  Resp (!) 24  Level of Consciousness Alert  SpO2 96 %  O2 Device Room Air  Patient Activity (if Appropriate) In bed  Assess: MEWS Score  MEWS Temp 0  MEWS Systolic 0  MEWS Pulse 1  MEWS RR 1  MEWS LOC 0  MEWS Score 2  MEWS Score Color Yellow  Assess: if the MEWS score is Yellow or Red  Were vital signs accurate and taken at a resting state? Yes  Does the patient meet 2 or more of the SIRS criteria? Yes  Does the patient have a confirmed or suspected source of infection? Yes  MEWS guidelines implemented  Yes, yellow  Treat  MEWS Interventions Considered administering scheduled or prn medications/treatments as ordered  Take Vital Signs  Increase Vital Sign Frequency  Yellow: Q2hr x1, continue Q4hrs until patient remains green for 12hrs  Escalate  MEWS: Escalate Yellow: Discuss with charge nurse and consider notifying provider and/or RRT  Notify: Charge Nurse/RN  Name of Charge Nurse/RN Notified Delford Field, RN  Provider Notification  Provider Name/Title Narda Bonds  Date Provider Notified 02/14/23  Time Provider Notified [phone]  Method of Notification Page  Notification Reason Other (Comment) (elevated HR and Respirations)  Provider response No new orders (no new orders at this time; will address when rounding)  Date of Provider Response 02/14/23  Time of Provider Response [phone]  Notify: Rapid Response  Name of Rapid Response RN Notified e-Link RN  Date Rapid Response Notified 02/14/23  Time Rapid Response Notified 0750  Assess: SIRS CRITERIA  SIRS Temperature  0  SIRS Pulse 1  SIRS Respirations  1  SIRS WBC 0  SIRS Score Sum  2

## 2023-02-14 NOTE — Assessment & Plan Note (Signed)
-   Secondary to pneumonia - Patient presented with fever, tachycardia, tachypnea and leukocytosis with chest x-ray finding of atypical possible viral pneumonia - Has been started on IV Rocephin and azithromycin and will continue - Will obtain respiratory viral panel

## 2023-02-14 NOTE — Plan of Care (Signed)
  Problem: Education: Goal: Knowledge of General Education information will improve Description: Including pain rating scale, medication(s)/side effects and non-pharmacologic comfort measures Outcome: Progressing   Problem: Health Behavior/Discharge Planning: Goal: Ability to manage health-related needs will improve Outcome: Progressing   Problem: Clinical Measurements: Goal: Ability to maintain clinical measurements within normal limits will improve Outcome: Progressing Goal: Diagnostic test results will improve Outcome: Progressing Goal: Respiratory complications will improve Outcome: Progressing Goal: Cardiovascular complication will be avoided Outcome: Progressing   Problem: Activity: Goal: Risk for activity intolerance will decrease Outcome: Progressing   Problem: Nutrition: Goal: Adequate nutrition will be maintained Outcome: Progressing   Problem: Elimination: Goal: Will not experience complications related to bowel motility Outcome: Progressing Goal: Will not experience complications related to urinary retention Outcome: Progressing   Problem: Pain Management: Goal: General experience of comfort will improve Outcome: Progressing   Problem: Safety: Goal: Ability to remain free from injury will improve Outcome: Progressing

## 2023-02-14 NOTE — Assessment & Plan Note (Signed)
-   Controlled.  Patient unable to verify his antihypertensive with me overnight.

## 2023-02-14 NOTE — Progress Notes (Addendum)
PROGRESS NOTE    Jesus Bush  ZOX:[id number] DOB: 06-22-1945 DOA: 02/13/2023 PCP: [name], MD   Brief Narrative: MCCLINTON TEH is a 77 y.o. male with a history of hypertension, paroxysmal atrial fibrillation, left upper lobe wedge resection, hypothyroidism, COPD.  Patient presented secondary to concerns for sepsis with symptoms of cough and body aches. On admission, he was found to have met sepsis criteria with chest imaging concerning for pneumonia. Empiric antibiotics started.   Assessment and Plan:  Sepsis Present on admission. Secondary to pneumonia. Blood cultures obtained and are no growth to date. Leukocytosis improved slightly -Continue antibiotics -Repeat CBC in AM  Community acquired pneumonia Noted on chest x-ray imaging and correlates with clinical symptoms. Diffuse interstitial reticulonodular densities concerning for atypical vs viral infection. Patient started empirically on Ceftriaxone and azithromycin. RVP, COVID-19, influenza and RSV negative. -Continue Ceftriaxone and azithromycin  Paroxysmal atrial fibrillation Stable. No tachycardia this morning. Not on anticoagulation. -Resume home diltiazem  COPD Stable. No evidence of exacerbation at this time. Patient takes Trelegy as an outpatient -Breo Ellipta while inpatient  Hyperlipidemia -Continue Crestor  Primary hypertension Patient is on diltiazem and losartan as an outpatient. Some hypotension initially on admission, however blood pressure has stabilized. Blood pressure still low-normal. -Resume home diltiazem and hold losartan  AKI Creatinine of 1.47 on admission. Resolved with IV fluids.   DVT prophylaxis: Lovenox Code Status:   Code Status: Full Code Family Communication: Wife at bedside Disposition Plan: Discharge home likely in 24 hours pending remaining afebrile x24 hours and culture data   Consultants:  None  Procedures:  None  Antimicrobials: Ceftriaxone Azithromycin     Subjective: Patient reports feeling much better than from admission. Breathing better. Some streaky blood with cough without frank hemoptysis.  Objective: BP 130/67 (BP Location: Right Arm)   Pulse 82   Temp 98.1 F (36.7 C) (Oral)   Resp 18   Ht 5\' 9"  (1.753 m)   Wt 74.8 kg   SpO2 98%   BMI 24.37 kg/m   Examination:  General exam: Appears calm and comfortable Respiratory system: Clear to auscultation. Respiratory effort normal. Cardiovascular system: S1 & S2 heard, RRR. No murmurs. Gastrointestinal system: Abdomen is nondistended, soft and nontender. Normal bowel sounds heard. Central nervous system: Alert and oriented. No focal neurological deficits. Musculoskeletal: No edema. No calf tenderness Psychiatry: Judgement and insight appear normal. Mood & affect appropriate.    Data Reviewed: I have personally reviewed following labs and imaging studies  CBC Lab Results  Component Value Date   WBC 17.2 (H) 02/14/2023   RBC 3.43 (L) 02/14/2023   HGB 10.6 (L) 02/14/2023   HCT 32.3 (L) 02/14/2023   MCV 94.2 02/14/2023   MCH 30.9 02/14/2023   PLT 234 02/14/2023   MCHC 32.8 02/14/2023   RDW 13.7 02/14/2023   LYMPHSABS 1.1 02/13/2023   MONOABS 1.5 (H) 02/13/2023   EOSABS 0.0 02/13/2023   BASOSABS 0.1 02/13/2023     Last metabolic panel Lab Results  Component Value Date   NA 138 02/14/2023   K 3.6 02/14/2023   CL 106 02/14/2023   CO2 24 02/14/2023   BUN 17 02/14/2023   CREATININE 1.08 02/14/2023   GLUCOSE 105 (H) 02/14/2023   GFRNONAA >60 02/14/2023   GFRAA 88 09/16/2020   CALCIUM 8.8 (L) 02/14/2023   PROT 7.2 02/13/2023   ALBUMIN 4.3 02/13/2023   BILITOT 0.6 02/13/2023   ALKPHOS 73 02/13/2023   AST 27 02/13/2023   ALT 30  02/13/2023   ANIONGAP 8 02/14/2023    GFR: Estimated Creatinine Clearance: 56.4 mL/min (by C-G formula based on SCr of 1.08 mg/dL).  Recent Results (from the past 240 hour(s))  Culture, blood (Routine x 2)     Status: None  (Preliminary result)   Collection Time: 02/13/23  4:44 PM   Specimen: BLOOD LEFT FOREARM  Result Value Ref Range Status   Specimen Description   Final    BLOOD LEFT FOREARM Performed at [facility], [address], [place], [place] [address]    Special Requests   Final    Blood Culture results may not be optimal due to an inadequate volume of blood received in culture bottles BOTTLES DRAWN AEROBIC ONLY Performed at [facility], [address] [place], [place] [address]    Culture   Final    NO GROWTH < 12 HOURS Performed at [facility], [address] [place], [place] [address]    Report Status PENDING  Incomplete  Resp panel by RT-PCR (RSV, Flu A&B, Covid) Anterior Nasal Swab     Status: None   Collection Time: 02/13/23  4:58 PM   Specimen: Anterior Nasal Swab  Result Value Ref Range Status   SARS Coronavirus 2 by RT PCR NEGATIVE NEGATIVE Final    Comment: (NOTE) SARS-CoV-2 target nucleic acids are NOT DETECTED.  The SARS-CoV-2 RNA is generally detectable in upper respiratory specimens during the acute phase of infection. The lowest concentration of SARS-CoV-2 viral copies this assay can detect is 138 copies/mL. A negative result does not preclude SARS-Cov-2 infection and should not be used as the sole basis for treatment or other patient management decisions. A negative result may occur with  improper specimen collection/handling, submission of specimen other than nasopharyngeal swab, presence of viral mutation(s) within the areas targeted by this assay, and inadequate number of viral copies(<138 copies/mL). A negative result must be combined with clinical observations, patient history, and epidemiological information. The expected result is Negative.  Fact Sheet for Patients:  BloggerCourse.com  Fact Sheet for Healthcare Providers:  SeriousBroker.it  This test is no t yet approved or  cleared by the [place] FDA and  has been authorized for detection and/or diagnosis of SARS-CoV-2 by FDA under an Emergency Use Authorization (EUA). This EUA will remain  in effect (meaning this test can be used) for the duration of the COVID-19 declaration under Section 564(b)(1) of the Act, 21 U.S.C.section 360bbb-3(b)(1), unless the authorization is terminated  or revoked sooner.       Influenza A by PCR NEGATIVE NEGATIVE Final   Influenza B by PCR NEGATIVE NEGATIVE Final    Comment: (NOTE) The Xpert Xpress SARS-CoV-2/FLU/RSV plus assay is intended as an aid in the diagnosis of influenza from Nasopharyngeal swab specimens and should not be used as a sole basis for treatment. Nasal washings and aspirates are unacceptable for Xpert Xpress SARS-CoV-2/FLU/RSV testing.  Fact Sheet for Patients: BloggerCourse.com  Fact Sheet for Healthcare Providers: SeriousBroker.it  This test is not yet approved or cleared by the [place] FDA and has been authorized for detection and/or diagnosis of SARS-CoV-2 by FDA under an Emergency Use Authorization (EUA). This EUA will remain in effect (meaning this test can be used) for the duration of the COVID-19 declaration under Section 564(b)(1) of the Act, 21 U.S.C. section 360bbb-3(b)(1), unless the authorization is terminated or revoked.     Resp Syncytial Virus by PCR NEGATIVE NEGATIVE Final    Comment: (NOTE) Fact  Sheet for Patients: BloggerCourse.com  Fact Sheet for Healthcare Providers: SeriousBroker.it  This test is not yet approved or cleared by the [place] FDA and has been authorized for detection and/or diagnosis of SARS-CoV-2 by FDA under an Emergency Use Authorization (EUA). This EUA will remain in effect (meaning this test can be used) for the duration of the COVID-19 declaration under Section 564(b)(1) of the Act, 21  U.S.C. section 360bbb-3(b)(1), unless the authorization is terminated or revoked.  Performed at [facility], [address] [place], [place] [address]   Culture, blood (Routine x 2)     Status: None (Preliminary result)   Collection Time: 02/13/23  7:35 PM   Specimen: BLOOD  Result Value Ref Range Status   Specimen Description   Final    BLOOD BLOOD LEFT FOREARM Performed at [facility], [address] [place], [place] [address]    Special Requests   Final    BOTTLES DRAWN AEROBIC AND ANAEROBIC Blood Culture adequate volume Performed at [facility], [address], [place], [place] [address]    Culture   Final    NO GROWTH < 12 HOURS Performed at [facility], [address] [place], [place] [address]    Report Status PENDING  Incomplete  Respiratory (~20 pathogens) panel by PCR     Status: None   Collection Time: 02/14/23 12:40 AM   Specimen: Nasopharyngeal Swab; Respiratory  Result Value Ref Range Status   Adenovirus NOT DETECTED NOT DETECTED Final   Coronavirus 229E NOT DETECTED NOT DETECTED Final    Comment: (NOTE) The Coronavirus on the Respiratory Panel, DOES NOT test for the novel  Coronavirus (2019 nCoV)    Coronavirus HKU1 NOT DETECTED NOT DETECTED Final   Coronavirus NL63 NOT DETECTED NOT DETECTED Final   Coronavirus OC43 NOT DETECTED NOT DETECTED Final   Metapneumovirus NOT DETECTED NOT DETECTED Final   Rhinovirus / Enterovirus NOT DETECTED NOT DETECTED Final   Influenza A NOT DETECTED NOT DETECTED Final   Influenza B NOT DETECTED NOT DETECTED Final   Parainfluenza Virus 1 NOT DETECTED NOT DETECTED Final   Parainfluenza Virus 2 NOT DETECTED NOT DETECTED Final   Parainfluenza Virus 3 NOT DETECTED NOT DETECTED Final   Parainfluenza Virus 4 NOT DETECTED NOT DETECTED Final   Respiratory Syncytial Virus NOT DETECTED NOT DETECTED Final   Bordetella pertussis NOT DETECTED NOT DETECTED Final    Bordetella Parapertussis NOT DETECTED NOT DETECTED Final   Chlamydophila pneumoniae NOT DETECTED NOT DETECTED Final   Mycoplasma pneumoniae NOT DETECTED NOT DETECTED Final    Comment: Performed at [facility], [address], [place], [place] [address]      Radiology Studies: DG Chest Port 1 View  Result Date: 02/13/2023 CLINICAL DATA:  Chest pain. EXAM: PORTABLE CHEST 1 VIEW COMPARISON:  Chest radiograph dated 12/20/2022. FINDINGS: There is diffuse interstitial reticulonodular densities. No focal consolidation, pleural effusion, pneumothorax. The cardiac silhouette is within normal limits. Atherosclerotic calcification of the aorta. No acute osseous pathology. IMPRESSION: Diffuse interstitial reticulonodular densities may represent atypical/viral infection. Electronically Signed   By: Elgie Collard M.D.   On: 02/13/2023 20:26      LOS: 0 days    Jacquelin Hawking, MD [facility] 02/14/2023, 2:08 PM   If 7PM-7AM, please contact night-coverage [email or web address]

## 2023-02-14 NOTE — Assessment & Plan Note (Signed)
Continue statin. 

## 2023-02-14 NOTE — Hospital Course (Addendum)
Jesus Bush is a 77 y.o. male with a history of hypertension, paroxysmal atrial fibrillation, left upper lobe wedge resection, hypothyroidism, COPD.  Patient presented secondary to concerns for sepsis with symptoms of cough and body aches. On admission, he was found to have met sepsis criteria with chest imaging concerning for pneumonia. Empiric antibiotics started with improvement of symptoms. Patient discharged to complete a 5-day course of antibiotics.

## 2023-02-15 ENCOUNTER — Other Ambulatory Visit: Payer: Self-pay | Admitting: Internal Medicine

## 2023-02-15 DIAGNOSIS — A419 Sepsis, unspecified organism: Secondary | ICD-10-CM | POA: Diagnosis not present

## 2023-02-15 LAB — CBC
HCT: 33.9 % — ABNORMAL LOW (ref 39.0–52.0)
Hemoglobin: 11 g/dL — ABNORMAL LOW (ref 13.0–17.0)
MCH: 30.7 pg (ref 26.0–34.0)
MCHC: 32.4 g/dL (ref 30.0–36.0)
MCV: 94.7 fL (ref 80.0–100.0)
Platelets: 245 10*3/uL (ref 150–400)
RBC: 3.58 MIL/uL — ABNORMAL LOW (ref 4.22–5.81)
RDW: 13.4 % (ref 11.5–15.5)
WBC: 10.9 10*3/uL — ABNORMAL HIGH (ref 4.0–10.5)
nRBC: 0 % (ref 0.0–0.2)

## 2023-02-15 MED ORDER — AZITHROMYCIN 500 MG PO TABS
500.0000 mg | ORAL_TABLET | Freq: Every day | ORAL | 0 refills | Status: AC
Start: 2023-02-15 — End: 2023-02-18

## 2023-02-15 MED ORDER — GUAIFENESIN-DM 100-10 MG/5ML PO SYRP: 5.0000 mL | ORAL_SOLUTION | ORAL | 0 refills | Status: DC | PRN

## 2023-02-15 MED ORDER — CEFDINIR 300 MG PO CAPS: 300.0000 mg | ORAL_CAPSULE | Freq: Two times a day (BID) | ORAL | 0 refills | Status: AC

## 2023-02-15 NOTE — TOC CM/SW Note (Signed)
Transition of Care [facility]) - Inpatient Brief Assessment   Patient Details  Name: Jesus Bush MRN: [id number] Date of Birth: [birth year]  [facility] of [facility]) CM/SW Contact:    Larrie Kass, LCSW Phone Number: 02/15/2023, 10:07 AM      Transition of Care Asessment: Insurance and Status: Insurance coverage has been reviewed Patient has primary care physician: Yes Home environment has been reviewed: home with spouse Prior level of function:: independent Prior/Current Home Services: No current home services Social Determinants of Health Reivew: SDOH reviewed no interventions necessary Readmission risk has been reviewed: Yes Transition of care needs: no transition of care needs at this time

## 2023-02-15 NOTE — Discharge Instructions (Signed)
Jesus Bush,  You were in the hospital with pneumonia. This has improved with antibiotics. Please continue antibiotics on discharge and follow-up with your PCP.

## 2023-02-15 NOTE — Plan of Care (Signed)

## 2023-02-15 NOTE — Discharge Summary (Signed)
Physician Discharge Summary   Patient: Jesus Bush MRN: [id number] DOB: 25-May-1945  Admit date:     02/13/2023  Discharge date: 02/15/23  Discharge Physician: Jacquelin Hawking, MD   PCP: [name], MD   Recommendations at discharge:  PCP visit for hospital follow-up Follow-up blood culture final result  Discharge Diagnoses: Principal Problem:   Sepsis [facility]) Active Problems:   Essential hypertension   Mixed hyperlipidemia   COPD (chronic obstructive pulmonary disease) (HCC)   Paroxysmal atrial fibrillation (HCC)   Pneumonia   AKI (acute kidney injury) (HCC)  Resolved Problems:   * No resolved hospital problems. *  Hospital Course: Jesus Bush is a 77 y.o. male with a history of hypertension, paroxysmal atrial fibrillation, left upper lobe wedge resection, hypothyroidism, COPD.  Patient presented secondary to concerns for sepsis with symptoms of cough and body aches. On admission, he was found to have met sepsis criteria with chest imaging concerning for pneumonia. Empiric antibiotics started with improvement of symptoms. Patient discharged to complete a 5-day course of antibiotics.  Assessment and Plan:  Sepsis Present on admission. Secondary to pneumonia. Blood cultures obtained and are no growth to date. Leukocytosis improved prior to discharge.   Community acquired pneumonia Noted on chest x-ray imaging and correlates with clinical symptoms. Diffuse interstitial reticulonodular densities concerning for atypical vs viral infection. Patient started empirically on Ceftriaxone and azithromycin. RVP, COVID-19, influenza and RSV negative. Patient transitioned to Cefdinir and azithromycin on discharge to complete a 5-day course of antibiotics.   Paroxysmal atrial fibrillation Stable. Not on anticoagulation. Continue diltiazem.   COPD Stable. No evidence of exacerbation at this time. Patient takes Trelegy as an outpatient; continue on discharge.    Hyperlipidemia Continue Crestor   Primary hypertension Patient is on diltiazem and losartan as an outpatient.Continue on discharge.  AKI Creatinine of 1.47 on admission. Resolved with IV fluids.   Consultants: None Procedures performed: None  Disposition: Home Diet recommendation: Cardiac diet   DISCHARGE MEDICATION: Allergies as of 02/15/2023       Reactions   Ppd [tuberculin Purified Protein Derivative]    Positive PPD.        Medication List     STOP taking these medications    promethazine-dextromethorphan 6.25-15 MG/5ML syrup Commonly known as: PROMETHAZINE-DM       TAKE these medications    acetaminophen 500 MG tablet Commonly known as: TYLENOL Take 1,000 mg by mouth daily as needed for moderate pain (pain score 4-6).   azithromycin 500 MG tablet Commonly known as: Zithromax Take 1 tablet (500 mg total) by mouth daily for 3 days.   cefdinir 300 MG capsule Commonly known as: OMNICEF Take 1 capsule (300 mg total) by mouth 2 (two) times daily for 3 days.   cetirizine 10 MG tablet Commonly known as: ZyrTEC Allergy Take one tablet nightly for allergies. What changed:  how much to take how to take this when to take this additional instructions   diltiazem 240 MG 24 hr capsule Commonly known as: DILACOR XR TAKE 1 CAPSULE DAILY FOR BLOOD PRESSURE AND HEART RHYTHM   famotidine 40 MG tablet Commonly known as: PEPCID Take 40 mg by mouth 2 (two) times daily.   gabapentin 300 MG capsule Commonly known as: Neurontin Take 1 capsule 1 to 2 hours before Bedtime if needed for Sleep What changed:  how much to take how to take this when to take this reasons to take this additional instructions   glipiZIDE 5 MG tablet Commonly known  as: GLUCOTROL Take 1/2 to 1 tablet 2 x /day with meals for Diabetes What changed:  how much to take how to take this when to take this additional instructions   guaiFENesin-dextromethorphan 100-10 MG/5ML  syrup Commonly known as: ROBITUSSIN DM Take 5 mLs by mouth every 4 (four) hours as needed for cough.   losartan 100 MG tablet Commonly known as: COZAAR TAKE 1 TABLET EVERY DAY FOR BLOOD PRESSURE   magnesium gluconate 500 MG tablet Commonly known as: MAGONATE Take 500 mg by mouth daily.   montelukast 10 MG tablet Commonly known as: SINGULAIR Take  1 tablet Daily for Allergies                                /                                                                   TAKE                                         BY                                                 MOUTH   rosuvastatin 40 MG tablet Commonly known as: CRESTOR TAKE 1 TABLET DAILY FOR CHOLESTEROL   Trelegy Ellipta 200-62.5-25 MCG/ACT Aepb Generic drug: Fluticasone-Umeclidin-Vilant Use  1 Inhalation  Daily  for COPD /Asthma What changed:  how much to take how to take this when to take this reasons to take this additional instructions   VITAMIN D PO Take 5,000 Units by mouth daily.        Follow-up Information     [name], MD. Schedule an appointment as soon as possible for a visit in 1 week(s).   Specialty: Internal Medicine Why: For hospital follow-up Contact information: [id number] [place] [address] [phone]                Discharge Exam: BP 111/62 (BP Location: Left Arm)   Pulse 86   Temp 98.4 F (36.9 C) (Oral)   Resp 16   Ht 5\' 9"  (1.753 m)   Wt 74.8 kg   SpO2 94%   BMI 24.37 kg/m   General exam: Appears calm and comfortable Respiratory system: Clear to auscultation. Respiratory effort normal. Cardiovascular system: S1 & S2 heard. Gastrointestinal system: Abdomen is nondistended, soft and nontender.  Normal bowel sounds heard. Central nervous system: Alert and oriented. No focal neurological deficits. Musculoskeletal: No edema. No calf tenderness Psychiatry: Judgement and insight appear normal. Mood & affect appropriate.   Condition at  discharge: stable  The results of significant diagnostics from this hospitalization (including imaging, microbiology, ancillary and laboratory) are listed below for reference.   Imaging Studies: DG Chest Port 1 View  Result Date: 02/13/2023 CLINICAL DATA:  Chest pain. EXAM: PORTABLE CHEST 1 VIEW COMPARISON:  Chest radiograph dated 12/20/2022. FINDINGS: There is diffuse interstitial reticulonodular densities. No focal consolidation, pleural  effusion, pneumothorax. The cardiac silhouette is within normal limits. Atherosclerotic calcification of the aorta. No acute osseous pathology. IMPRESSION: Diffuse interstitial reticulonodular densities may represent atypical/viral infection. Electronically Signed   By: Elgie Collard M.D.   On: 02/13/2023 20:26    Microbiology: Results for orders placed or performed during the hospital encounter of 02/13/23  Culture, blood (Routine x 2)     Status: None (Preliminary result)   Collection Time: 02/13/23  4:44 PM   Specimen: BLOOD LEFT FOREARM  Result Value Ref Range Status   Specimen Description   Final    BLOOD LEFT FOREARM Performed at [facility], [address], [place], [place] [address]    Special Requests   Final    Blood Culture results may not be optimal due to an inadequate volume of blood received in culture bottles BOTTLES DRAWN AEROBIC ONLY Performed at [facility], [address], [place], [place] [address]    Culture   Final    NO GROWTH 2 DAYS Performed at [facility], [address], [place], [place] [address]    Report Status PENDING  Incomplete  Resp panel by RT-PCR (RSV, Flu A&B, Covid) Anterior Nasal Swab     Status: None   Collection Time: 02/13/23  4:58 PM   Specimen: Anterior Nasal Swab  Result Value Ref Range Status   SARS Coronavirus 2 by RT PCR NEGATIVE NEGATIVE Final    Comment: (NOTE) SARS-CoV-2 target nucleic acids are NOT DETECTED.  The SARS-CoV-2 RNA is generally detectable in  upper respiratory specimens during the acute phase of infection. The lowest concentration of SARS-CoV-2 viral copies this assay can detect is 138 copies/mL. A negative result does not preclude SARS-Cov-2 infection and should not be used as the sole basis for treatment or other patient management decisions. A negative result may occur with  improper specimen collection/handling, submission of specimen other than nasopharyngeal swab, presence of viral mutation(s) within the areas targeted by this assay, and inadequate number of viral copies(<138 copies/mL). A negative result must be combined with clinical observations, patient history, and epidemiological information. The expected result is Negative.  Fact Sheet for Patients:  BloggerCourse.com  Fact Sheet for Healthcare Providers:  SeriousBroker.it  This test is no t yet approved or cleared by the [place] FDA and  has been authorized for detection and/or diagnosis of SARS-CoV-2 by FDA under an Emergency Use Authorization (EUA). This EUA will remain  in effect (meaning this test can be used) for the duration of the COVID-19 declaration under Section 564(b)(1) of the Act, 21 U.S.C.section 360bbb-3(b)(1), unless the authorization is terminated  or revoked sooner.       Influenza A by PCR NEGATIVE NEGATIVE Final   Influenza B by PCR NEGATIVE NEGATIVE Final    Comment: (NOTE) The Xpert Xpress SARS-CoV-2/FLU/RSV plus assay is intended as an aid in the diagnosis of influenza from Nasopharyngeal swab specimens and should not be used as a sole basis for treatment. Nasal washings and aspirates are unacceptable for Xpert Xpress SARS-CoV-2/FLU/RSV testing.  Fact Sheet for Patients: BloggerCourse.com  Fact Sheet for Healthcare Providers: SeriousBroker.it  This test is not yet approved or cleared by the [place] FDA and has been  authorized for detection and/or diagnosis of SARS-CoV-2 by FDA under an Emergency Use Authorization (EUA). This EUA will remain in effect (meaning this test can be used) for the duration of the COVID-19 declaration under Section 564(b)(1) of the Act, 21 U.S.C. section 360bbb-3(b)(1), unless  the authorization is terminated or revoked.     Resp Syncytial Virus by PCR NEGATIVE NEGATIVE Final    Comment: (NOTE) Fact Sheet for Patients: BloggerCourse.com  Fact Sheet for Healthcare Providers: SeriousBroker.it  This test is not yet approved or cleared by the [place] FDA and has been authorized for detection and/or diagnosis of SARS-CoV-2 by FDA under an Emergency Use Authorization (EUA). This EUA will remain in effect (meaning this test can be used) for the duration of the COVID-19 declaration under Section 564(b)(1) of the Act, 21 U.S.C. section 360bbb-3(b)(1), unless the authorization is terminated or revoked.  Performed at [facility], [address], [place], [place] [address]   Culture, blood (Routine x 2)     Status: None (Preliminary result)   Collection Time: 02/13/23  7:35 PM   Specimen: BLOOD  Result Value Ref Range Status   Specimen Description   Final    BLOOD BLOOD LEFT FOREARM Performed at [facility], [address], [place], [place] [address]    Special Requests   Final    BOTTLES DRAWN AEROBIC AND ANAEROBIC Blood Culture adequate volume Performed at [facility], [address] [place], [place] [address]    Culture   Final    NO GROWTH 2 DAYS Performed at [facility], [address], [place], [place] [address]    Report Status PENDING  Incomplete  Respiratory (~20 pathogens) panel by PCR     Status: None   Collection Time: 02/14/23 12:40 AM   Specimen: Nasopharyngeal Swab; Respiratory  Result Value Ref Range Status   Adenovirus NOT  DETECTED NOT DETECTED Final   Coronavirus 229E NOT DETECTED NOT DETECTED Final    Comment: (NOTE) The Coronavirus on the Respiratory Panel, DOES NOT test for the novel  Coronavirus (2019 nCoV)    Coronavirus HKU1 NOT DETECTED NOT DETECTED Final   Coronavirus NL63 NOT DETECTED NOT DETECTED Final   Coronavirus OC43 NOT DETECTED NOT DETECTED Final   Metapneumovirus NOT DETECTED NOT DETECTED Final   Rhinovirus / Enterovirus NOT DETECTED NOT DETECTED Final   Influenza A NOT DETECTED NOT DETECTED Final   Influenza B NOT DETECTED NOT DETECTED Final   Parainfluenza Virus 1 NOT DETECTED NOT DETECTED Final   Parainfluenza Virus 2 NOT DETECTED NOT DETECTED Final   Parainfluenza Virus 3 NOT DETECTED NOT DETECTED Final   Parainfluenza Virus 4 NOT DETECTED NOT DETECTED Final   Respiratory Syncytial Virus NOT DETECTED NOT DETECTED Final   Bordetella pertussis NOT DETECTED NOT DETECTED Final   Bordetella Parapertussis NOT DETECTED NOT DETECTED Final   Chlamydophila pneumoniae NOT DETECTED NOT DETECTED Final   Mycoplasma pneumoniae NOT DETECTED NOT DETECTED Final    Comment: Performed at [facility], [address], [place], [place] [address]    Labs: CBC: Recent Labs  Lab 02/13/23 1658 02/14/23 0441 02/15/23 0744  WBC 19.0* 17.2* 10.9*  NEUTROABS 16.3*  --   --   HGB 12.8* 10.6* 11.0*  HCT 38.1* 32.3* 33.9*  MCV 90.7 94.2 94.7  PLT 312 234 245   Basic Metabolic Panel: Recent Labs  Lab 02/13/23 1658 02/14/23 0441  NA 138 138  K 3.7 3.6  CL 104 106  CO2 24 24  GLUCOSE 132* 105*  BUN 19 17  CREATININE 1.47* 1.08  CALCIUM 9.8 8.8*   Liver Function Tests: Recent Labs  Lab 02/13/23 1658  AST 27  ALT 30  ALKPHOS 73  BILITOT 0.6  PROT 7.2  ALBUMIN 4.3    Discharge time spent: 35 minutes.  Signed: Jacquelin Hawking, MD [facility] 02/15/2023

## 2023-02-18 LAB — CULTURE, BLOOD (ROUTINE X 2)
Culture: NO GROWTH
Culture: NO GROWTH
Special Requests: ADEQUATE

## 2023-02-18 NOTE — Progress Notes (Unsigned)
Hospital follow up  Assessment and Plan: Hospital visit follow up for:   Sepsis Present on admission. Secondary to pneumonia. Blood cultures and respiratory panel were negative Completed antibiotics  - CBC - CMP  Community acquired pneumonia Noted on chest x-ray imaging and correlates with clinical symptoms. Diffuse interstitial reticulonodular densities concerning for atypical vs viral infection. Patient started empirically on Ceftriaxone and azithromycin. RVP, COVID-19, influenza and RSV negative. Patient transitioned to Cefdinir and azithromycin on discharge to complete a 5-day course of antibiotics. - complete course of antibiotics - Tessalon perles and Promethazine DM cough syrup for persistent dry cough   Paroxysmal atrial fibrillation Stable. Not on anticoagulation. Continue diltiazem.   COPD Stable. No evidence of exacerbation at this time. Strongly encouraged to use Trelegy everyday   Hyperlipidemia Continue Crestor Continue diet and exercise   Primary hypertension Patient is on diltiazem and losartan as an outpatient.Continue on discharge.   AKI Creatinine of 1.47 on admission. Resolved with IV fluids. Encouraged to push water throughout the day - CMP   All medications were reviewed with patient and family and fully reconciled. All questions answered fully, and patient and family members were encouraged to call the office with any further questions or concerns. Discussed goal to avoid readmission related to this diagnosis.  There are no discontinued medications.    Over 40 minutes of exam, counseling, chart review, and complex, high/moderate level critical decision making was performed this visit.   Future Appointments  Date Time Provider Department Center  05/15/2023 11:00 AM Adela Glimpse, NP [facility] None  08/15/2023 11:30 AM [name], MD [facility] None  11/18/2023 11:30 AM Adela Glimpse, NP [facility] None  02/24/2024 11:00 AM [name],  MD [facility] None     HPI 77 y.o.male presents for follow up for transition from recent hospitalization or SNIF stay. Admit date to the hospital was 02/13/23, patient was discharged from the hospital on 02/15/23 and our clinical staff contacted the office the day after discharge to set up a follow up appointment. The discharge summary, medications, and diagnostic test results were reviewed before meeting with the patient. The patient was admitted for:   KENNETH SLATTEN is a 77 y.o. male with a history of hypertension, paroxysmal atrial fibrillation, left upper lobe wedge resection, hypothyroidism, COPD.  Patient presented secondary to concerns for sepsis with symptoms of cough and body aches. On admission, he was found to have met sepsis criteria with chest imaging concerning for pneumonia. Empiric antibiotics started with improvement of symptoms. Patient discharged to complete a 5-day course of antibiotics.   Assessment and Plan:   Sepsis Present on admission. Secondary to pneumonia. Blood cultures obtained and are no growth to date. Leukocytosis improved prior to discharge.   Community acquired pneumonia Noted on chest x-ray imaging and correlates with clinical symptoms. Diffuse interstitial reticulonodular densities concerning for atypical vs viral infection. Patient started empirically on Ceftriaxone and azithromycin. RVP, COVID-19, influenza and RSV negative. Patient transitioned to Cefdinir and azithromycin on discharge to complete a 5-day course of antibiotics.   Paroxysmal atrial fibrillation Stable. Not on anticoagulation. Continue diltiazem.   COPD Stable. No evidence of exacerbation at this time. Patient takes Trelegy as an outpatient; continue on discharge.   Hyperlipidemia Continue Crestor   Primary hypertension Patient is on diltiazem and losartan as an outpatient.Continue on discharge.   AKI Creatinine of 1.47 on admission. Resolved with IV fluids.      Images  while in the hospital: [facility]  Result Date: 02/13/2023 CLINICAL DATA:  Chest pain. EXAM: PORTABLE CHEST 1 VIEW COMPARISON:  Chest radiograph dated 12/20/2022. FINDINGS: There is diffuse interstitial reticulonodular densities. No focal consolidation, pleural effusion, pneumothorax. The cardiac silhouette is within normal limits. Atherosclerotic calcification of the aorta. No acute osseous pathology. IMPRESSION: Diffuse interstitial reticulonodular densities may represent atypical/viral infection. Electronically Signed   By: Elgie Collard M.D.   On: 02/13/2023 20:26     Current Outpatient Medications (Endocrine & Metabolic):    glipiZIDE (GLUCOTROL) 5 MG tablet, Take 1/2 to 1 tablet 2 x /day with meals for Diabetes (Patient taking differently: Take 2.5-5 mg by mouth 2 (two) times daily.)  Current Outpatient Medications (Cardiovascular):    diltiazem (DILACOR XR) 240 MG 24 hr capsule, TAKE 1 CAPSULE DAILY FOR BLOOD PRESSURE AND HEART RHYTHM   losartan (COZAAR) 100 MG tablet, TAKE 1 TABLET EVERY DAY FOR BLOOD PRESSURE   rosuvastatin (CRESTOR) 40 MG tablet, TAKE 1 TABLET DAILY FOR CHOLESTEROL  Current Outpatient Medications (Respiratory):    benzonatate (TESSALON PERLES) 100 MG capsule, Take 1 capsule (100 mg total) by mouth every 6 (six) hours as needed for cough.   cetirizine (ZYRTEC ALLERGY) 10 MG tablet, Take one tablet nightly for allergies. (Patient taking differently: Take 10 mg by mouth daily.)   Fluticasone-Umeclidin-Vilant (TRELEGY ELLIPTA) 200-62.5-25 MCG/ACT AEPB, Use  1 Inhalation  Daily  for COPD /Asthma (Patient taking differently: Inhale 1 puff into the lungs daily as needed (COPD/Asthma).)   guaiFENesin-dextromethorphan (ROBITUSSIN DM) 100-10 MG/5ML syrup, Take 5 mLs by mouth every 4 (four) hours as needed for cough.   montelukast (SINGULAIR) 10 MG tablet, Take  1 tablet Daily for Allergies                                /                                                                    TAKE                                         BY                                                 MOUTH   promethazine-dextromethorphan (PROMETHAZINE-DM) 6.25-15 MG/5ML syrup, Take 5 mLs by mouth 4 (four) times daily as needed for cough.  Current Outpatient Medications (Analgesics):    acetaminophen (TYLENOL) 500 MG tablet, Take 1,000 mg by mouth daily as needed for moderate pain (pain score 4-6).   Current Outpatient Medications (Other):    famotidine (PEPCID) 40 MG tablet, Take  1 tablet   2 x / day  to Prevent Heartburn & Indigestion   gabapentin (NEURONTIN) 300 MG capsule, Take 1 capsule 1 to 2 hours before Bedtime if needed for Sleep (Patient taking differently: Take 300 mg by mouth 3 (three) times daily as needed (Pain).)   magnesium gluconate (MAGONATE) 500 MG tablet, Take 500 mg by  mouth daily.    VITAMIN D PO, Take 5,000 Units by mouth daily.  Past Medical History:  Diagnosis Date   Diabetes mellitus without complication (HCC)    Hyperlipidemia    Hypertension    Vitamin D deficiency      Allergies  Allergen Reactions   Ppd [Tuberculin Purified Protein Derivative]     Positive PPD.    ROS: all negative except above.   Physical Exam: Filed Weights   02/19/23 1454  Weight: 168 lb 9.6 oz (76.5 kg)   BP 112/64   Pulse 80   Temp (!) 97.5 F (36.4 C)   Ht 5\' 9"  (1.753 m)   Wt 168 lb 9.6 oz (76.5 kg)   SpO2 97%   BMI 24.90 kg/m  General Appearance: Well nourished, in no apparent distress. Eyes: PERRLA, EOMs, conjunctiva no swelling or erythema Sinuses: No Frontal/maxillary tenderness ENT/Mouth: Ext aud canals clear, TMs without erythema, bulging. No erythema, swelling, or exudate on post pharynx.  Hearing normal.  Neck: Supple, thyroid normal.  Respiratory: Respiratory effort normal, BS equal bilaterally without rales, rhonchi, wheezing or stridor.  Cardio: RRR with no MRGs. Brisk peripheral pulses without edema.  Abdomen: Soft, + BS.  Non  tender, no guarding, rebound, hernias, masses. Lymphatics: Non tender without lymphadenopathy.  Musculoskeletal: Full ROM, 5/5 strength, normal gait.  Skin: Warm, dry without rashes, lesions, ecchymosis.  Neuro: Cranial nerves intact. Normal muscle tone, no cerebellar symptoms. Sensation intact.  Psych: Awake and oriented X 3, normal affect, Insight and Judgment appropriate.     Raynelle Dick, NP 3:14 PM [facility]

## 2023-02-19 ENCOUNTER — Encounter: Payer: Self-pay | Admitting: Nurse Practitioner

## 2023-02-19 ENCOUNTER — Ambulatory Visit (INDEPENDENT_AMBULATORY_CARE_PROVIDER_SITE_OTHER): Payer: Medicare HMO | Admitting: Nurse Practitioner

## 2023-02-19 VITALS — BP 112/64 | HR 80 | Temp 97.5°F | Ht 69.0 in | Wt 168.6 lb

## 2023-02-19 DIAGNOSIS — I480 Paroxysmal atrial fibrillation: Secondary | ICD-10-CM

## 2023-02-19 DIAGNOSIS — I10 Essential (primary) hypertension: Secondary | ICD-10-CM | POA: Diagnosis not present

## 2023-02-19 DIAGNOSIS — R509 Fever, unspecified: Secondary | ICD-10-CM

## 2023-02-19 DIAGNOSIS — R0603 Acute respiratory distress: Secondary | ICD-10-CM

## 2023-02-19 DIAGNOSIS — J189 Pneumonia, unspecified organism: Secondary | ICD-10-CM

## 2023-02-19 DIAGNOSIS — E1122 Type 2 diabetes mellitus with diabetic chronic kidney disease: Secondary | ICD-10-CM | POA: Diagnosis not present

## 2023-02-19 DIAGNOSIS — N182 Chronic kidney disease, stage 2 (mild): Secondary | ICD-10-CM

## 2023-02-19 DIAGNOSIS — E1169 Type 2 diabetes mellitus with other specified complication: Secondary | ICD-10-CM

## 2023-02-19 DIAGNOSIS — J449 Chronic obstructive pulmonary disease, unspecified: Secondary | ICD-10-CM

## 2023-02-19 DIAGNOSIS — N179 Acute kidney failure, unspecified: Secondary | ICD-10-CM | POA: Diagnosis not present

## 2023-02-19 DIAGNOSIS — E785 Hyperlipidemia, unspecified: Secondary | ICD-10-CM

## 2023-02-19 MED ORDER — PROMETHAZINE-DM 6.25-15 MG/5ML PO SYRP: 5.0000 mL | ORAL_SOLUTION | Freq: Four times a day (QID) | ORAL | 1 refills | Status: DC | PRN

## 2023-02-19 MED ORDER — BENZONATATE 100 MG PO CAPS
100.0000 mg | ORAL_CAPSULE | Freq: Four times a day (QID) | ORAL | 1 refills | Status: DC | PRN
Start: 2023-02-19 — End: 2023-06-12

## 2023-02-19 NOTE — Patient Instructions (Signed)
Chronic Obstructive Pulmonary Disease  Chronic obstructive pulmonary disease (COPD) is a long-term (chronic) lung problem. When you have COPD, it is hard for air to get in and out of your lungs. Usually the condition gets worse over time, and your lungs will never return to normal. There are things you can do to keep yourself as healthy as possible. What are the causes? Smoking. This is the most common cause. Certain genes passed from parent to child (inherited). What increases the risk? Being exposed to secondhand smoke from cigarettes, pipes, or cigars. Being exposed to chemicals and other irritants, such as fumes and dust in the work environment. Having chronic lung conditions or infections. What are the signs or symptoms? Shortness of breath, especially during physical activity. A long-term cough with a large amount of thick mucus. Sometimes, the cough may not have any mucus (dry cough). Wheezing. Breathing quickly. Skin that looks gray or blue, especially in the fingers, toes, or lips. Feeling tired (fatigue). Weight loss. Chest tightness. Having infections often. Episodes when breathing symptoms become much worse (exacerbations). At the later stages of this disease, you may have swelling in the ankles, feet, or legs. How is this treated? Taking medicines. Quitting smoking, if you smoke. Rehabilitation. This includes steps to make your body work better. It may involve a team of specialists. Doing exercises. Making changes to your diet. Using oxygen. Lung surgery. Lung transplant. Comfort measures (palliative care). Follow these instructions at home: Medicines Take over-the-counter and prescription medicines only as told by your doctor. Talk to your doctor before taking any cough or allergy medicines. You may need to avoid medicines that cause your lungs to be dry. Lifestyle If you smoke, stop smoking. Smoking makes the problem worse. Do not smoke or use any products that  contain nicotine or tobacco. If you need help quitting, ask your doctor. Avoid being around things that make your breathing worse. This may include smoke, chemicals, and fumes. Stay active, but remember to rest as well. Learn and use tips on how to manage stress and control your breathing. Make sure you get enough sleep. Most adults need at least 7 hours of sleep every night. Eat healthy foods. Eat smaller meals more often. Rest before meals. Controlled breathing Learn and use tips on how to control your breathing as told by your doctor. Try: Breathing in (inhaling) through your nose for 1 second. Then, pucker your lips and breath out (exhale) through your lips for 2 seconds. Putting one hand on your belly (abdomen). Breathe in slowly through your nose for 1 second. Your hand on your belly should move out. Pucker your lips and breathe out slowly through your lips. Your hand on your belly should move in as you breathe out.  Controlled coughing Learn and use controlled coughing to clear mucus from your lungs. Follow these steps: Lean your head a little forward. Breathe in deeply. Try to hold your breath for 3 seconds. Keep your mouth slightly open while coughing 2 times. Spit any mucus out into a tissue. Rest and do the steps again 1 or 2 times as needed. General instructions Make sure you get all the shots (vaccines) that your doctor recommends. Ask your doctor about a flu shot and a pneumonia shot. Use oxygen therapy and pulmonary rehabilitation if told by your doctor. If you need home oxygen therapy, ask your doctor if you should buy a tool to measure your oxygen level (oximeter). Make a COPD action plan with your doctor. This helps you   to know what to do if you feel worse than usual. Manage any other conditions you have as told by your doctor. Avoid going outside when it is very hot, cold, or humid. Avoid people who have a sickness you can catch (contagious). Keep all follow-up  visits. Contact a doctor if: You cough up more mucus than usual. There is a change in the color or thickness of the mucus. It is harder to breathe than usual. Your breathing is faster than usual. You have trouble sleeping. You need to use your medicines more often than usual. You have trouble doing your normal activities such as getting dressed or walking around the house. Get help right away if: You have shortness of breath while resting. You have shortness of breath that stops you from: Being able to talk. Doing normal activities. Your chest hurts for longer than 5 minutes. Your skin color is more blue than usual. Your pulse oximeter shows that you have low oxygen for longer than 5 minutes. You have a fever. You feel too tired to breathe normally. These symptoms may represent a serious problem that is an emergency. Do not wait to see if the symptoms will go away. Get medical help right away. Call your local emergency services (911 in the [place] Do not drive yourself to the hospital. Summary Chronic obstructive pulmonary disease (COPD) is a long-term lung problem. The way your lungs work will never return to normal. Usually the condition gets worse over time. There are things you can do to keep yourself as healthy as possible. Take over-the-counter and prescription medicines only as told by your doctor. If you smoke, stop. Smoking makes the problem worse. This information is not intended to replace advice given to you by your health care provider. Make sure you discuss any questions you have with your health care provider. Document Revised: 01/18/2020 Document Reviewed: 01/19/2020 Elsevier Patient Education  2024 [employer] Inc.  

## 2023-02-20 LAB — CBC WITH DIFFERENTIAL/PLATELET
Absolute Lymphocytes: 1935 {cells}/uL (ref 850–3900)
Absolute Monocytes: 955 {cells}/uL — ABNORMAL HIGH (ref 200–950)
Basophils Absolute: 112 {cells}/uL (ref 0–200)
Basophils Relative: 1.3 %
Eosinophils Absolute: 69 {cells}/uL (ref 15–500)
Eosinophils Relative: 0.8 %
HCT: 38.5 % (ref 38.5–50.0)
Hemoglobin: 12.4 g/dL — ABNORMAL LOW (ref 13.2–17.1)
MCH: 30.4 pg (ref 27.0–33.0)
MCHC: 32.2 g/dL (ref 32.0–36.0)
MCV: 94.4 fL (ref 80.0–100.0)
MPV: 10.4 fL (ref 7.5–12.5)
Monocytes Relative: 11.1 %
Neutro Abs: 5530 {cells}/uL (ref 1500–7800)
Neutrophils Relative %: 64.3 %
Platelets: 390 10*3/uL (ref 140–400)
RBC: 4.08 10*6/uL — ABNORMAL LOW (ref 4.20–5.80)
RDW: 12.7 % (ref 11.0–15.0)
Total Lymphocyte: 22.5 %
WBC: 8.6 10*3/uL (ref 3.8–10.8)

## 2023-02-20 LAB — COMPLETE METABOLIC PANEL WITH GFR
AG Ratio: 1.5 (calc) (ref 1.0–2.5)
ALT: 14 U/L (ref 9–46)
AST: 14 U/L (ref 10–35)
Albumin: 4 g/dL (ref 3.6–5.1)
Alkaline phosphatase (APISO): 86 U/L (ref 35–144)
BUN: 12 mg/dL (ref 7–25)
CO2: 27 mmol/L (ref 20–32)
Calcium: 9.7 mg/dL (ref 8.6–10.3)
Chloride: 104 mmol/L (ref 98–110)
Creat: 1.22 mg/dL (ref 0.70–1.28)
Globulin: 2.7 g/dL (ref 1.9–3.7)
Glucose, Bld: 86 mg/dL (ref 65–99)
Potassium: 4.5 mmol/L (ref 3.5–5.3)
Sodium: 142 mmol/L (ref 135–146)
Total Bilirubin: 0.4 mg/dL (ref 0.2–1.2)
Total Protein: 6.7 g/dL (ref 6.1–8.1)
eGFR: 61 mL/min/{1.73_m2} (ref >=60)

## 2023-02-25 ENCOUNTER — Telehealth: Payer: Self-pay | Admitting: Nurse Practitioner

## 2023-02-25 ENCOUNTER — Other Ambulatory Visit: Payer: Self-pay | Admitting: Nurse Practitioner

## 2023-02-25 MED ORDER — DILTIAZEM HCL ER 240 MG PO CP24: ORAL_CAPSULE | ORAL | 3 refills | Status: DC

## 2023-02-25 NOTE — Telephone Encounter (Signed)
Refill request on DILACOR. Please send to [facility] - [place], [place] - [address]

## 2023-03-28 ENCOUNTER — Other Ambulatory Visit (HOSPITAL_COMMUNITY): Payer: Self-pay

## 2023-04-10 ENCOUNTER — Other Ambulatory Visit (HOSPITAL_COMMUNITY): Payer: Self-pay

## 2023-04-10 ENCOUNTER — Other Ambulatory Visit: Payer: Self-pay | Admitting: Nurse Practitioner

## 2023-04-10 ENCOUNTER — Telehealth: Payer: Self-pay | Admitting: Nurse Practitioner

## 2023-04-10 ENCOUNTER — Other Ambulatory Visit: Payer: Self-pay | Admitting: Internal Medicine

## 2023-04-10 ENCOUNTER — Other Ambulatory Visit: Payer: Self-pay

## 2023-04-10 DIAGNOSIS — I10 Essential (primary) hypertension: Secondary | ICD-10-CM

## 2023-04-10 DIAGNOSIS — Z125 Encounter for screening for malignant neoplasm of prostate: Secondary | ICD-10-CM

## 2023-04-10 DIAGNOSIS — K219 Gastro-esophageal reflux disease without esophagitis: Secondary | ICD-10-CM

## 2023-04-10 DIAGNOSIS — J302 Other seasonal allergic rhinitis: Secondary | ICD-10-CM

## 2023-04-10 DIAGNOSIS — J449 Chronic obstructive pulmonary disease, unspecified: Secondary | ICD-10-CM

## 2023-04-10 DIAGNOSIS — E1122 Type 2 diabetes mellitus with diabetic chronic kidney disease: Secondary | ICD-10-CM

## 2023-04-10 DIAGNOSIS — E1169 Type 2 diabetes mellitus with other specified complication: Secondary | ICD-10-CM

## 2023-04-10 IMAGING — CR DG CHEST 2V
2 series · 2 of 2 positions shown · non-contrast
Comparison: 04/19/2021, 01/06/2021

CLINICAL DATA: 75-year-old male with follow-up chest x-ray of prior
dated 04/19/2021

EXAM:
CHEST - 2 VIEW

[w chest pa]
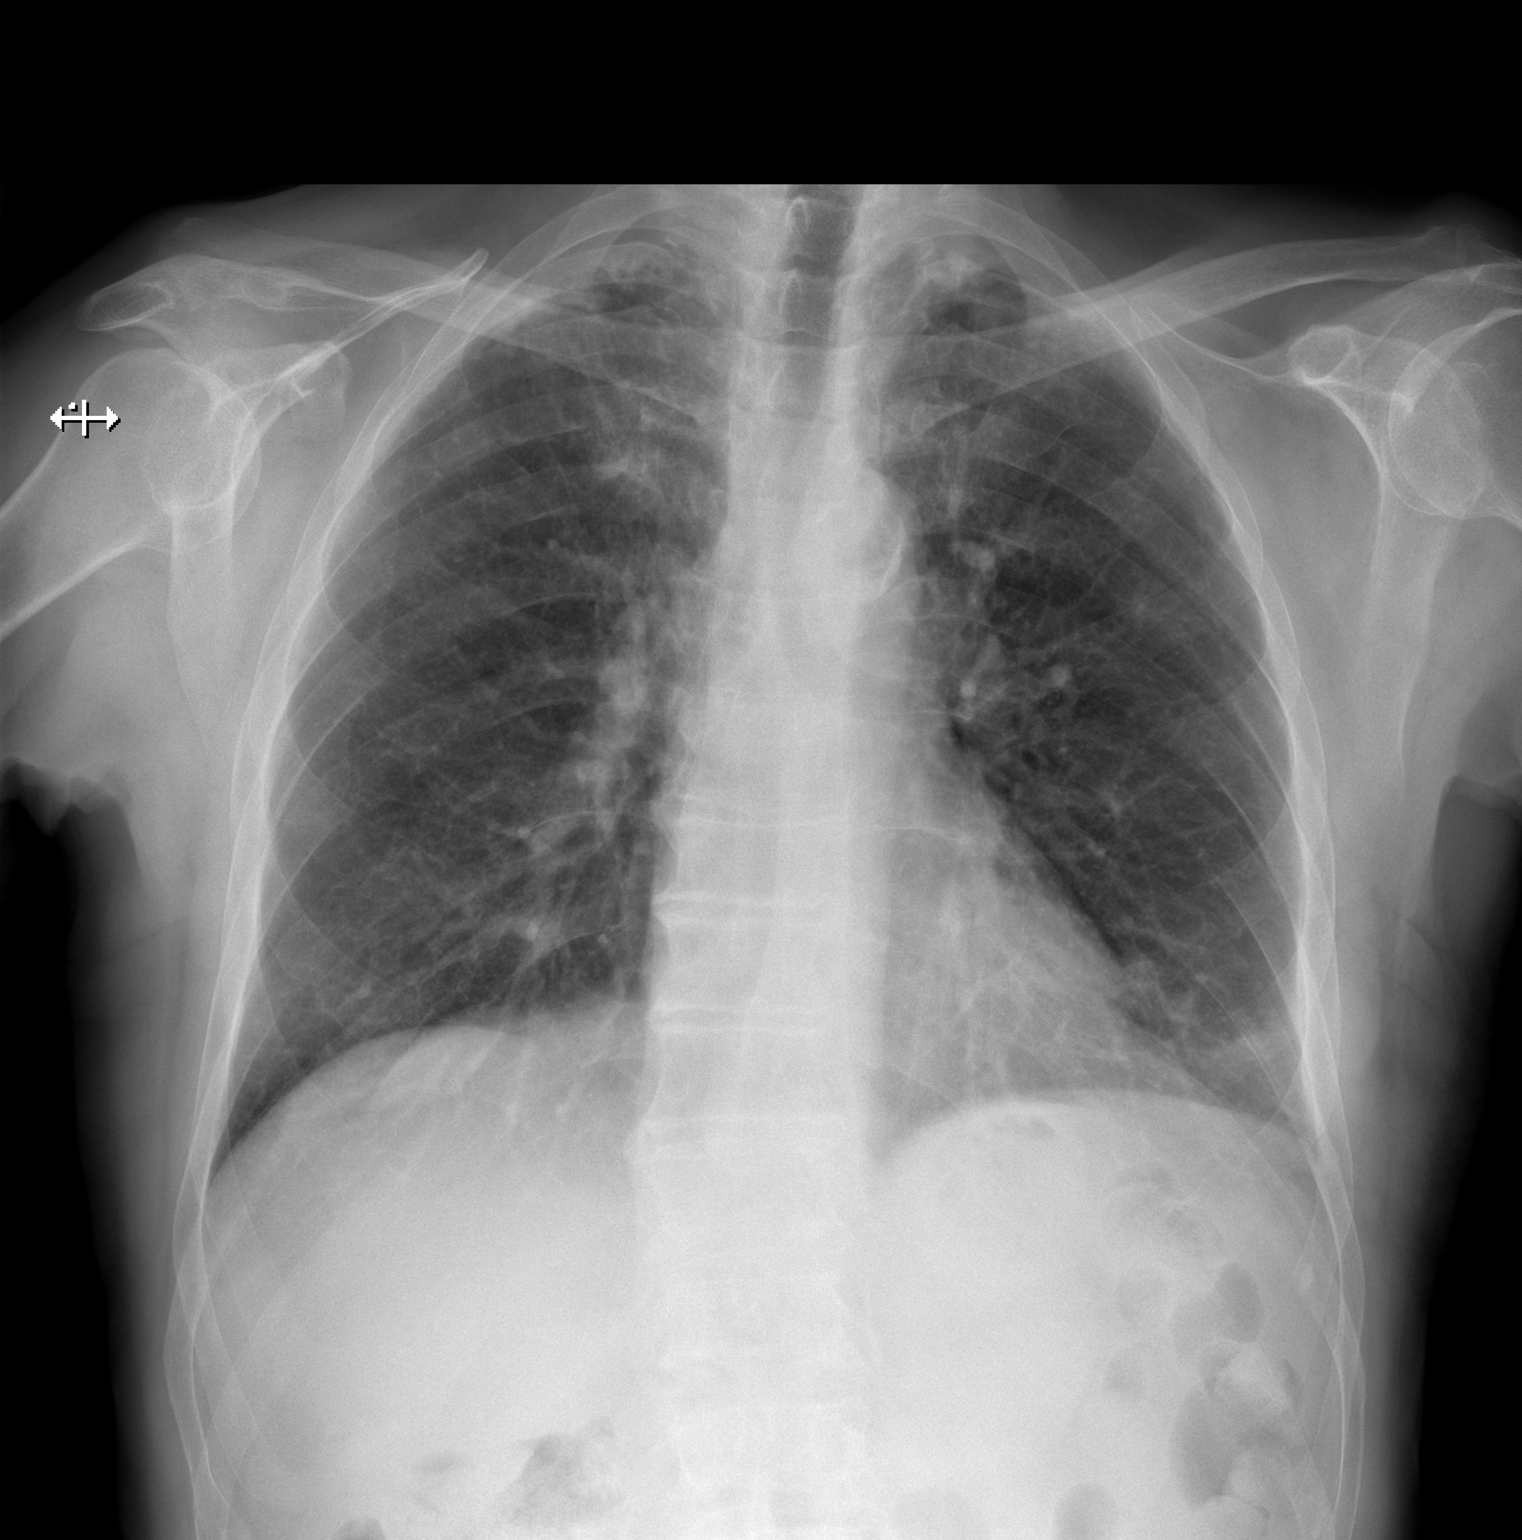

[w chest lat]
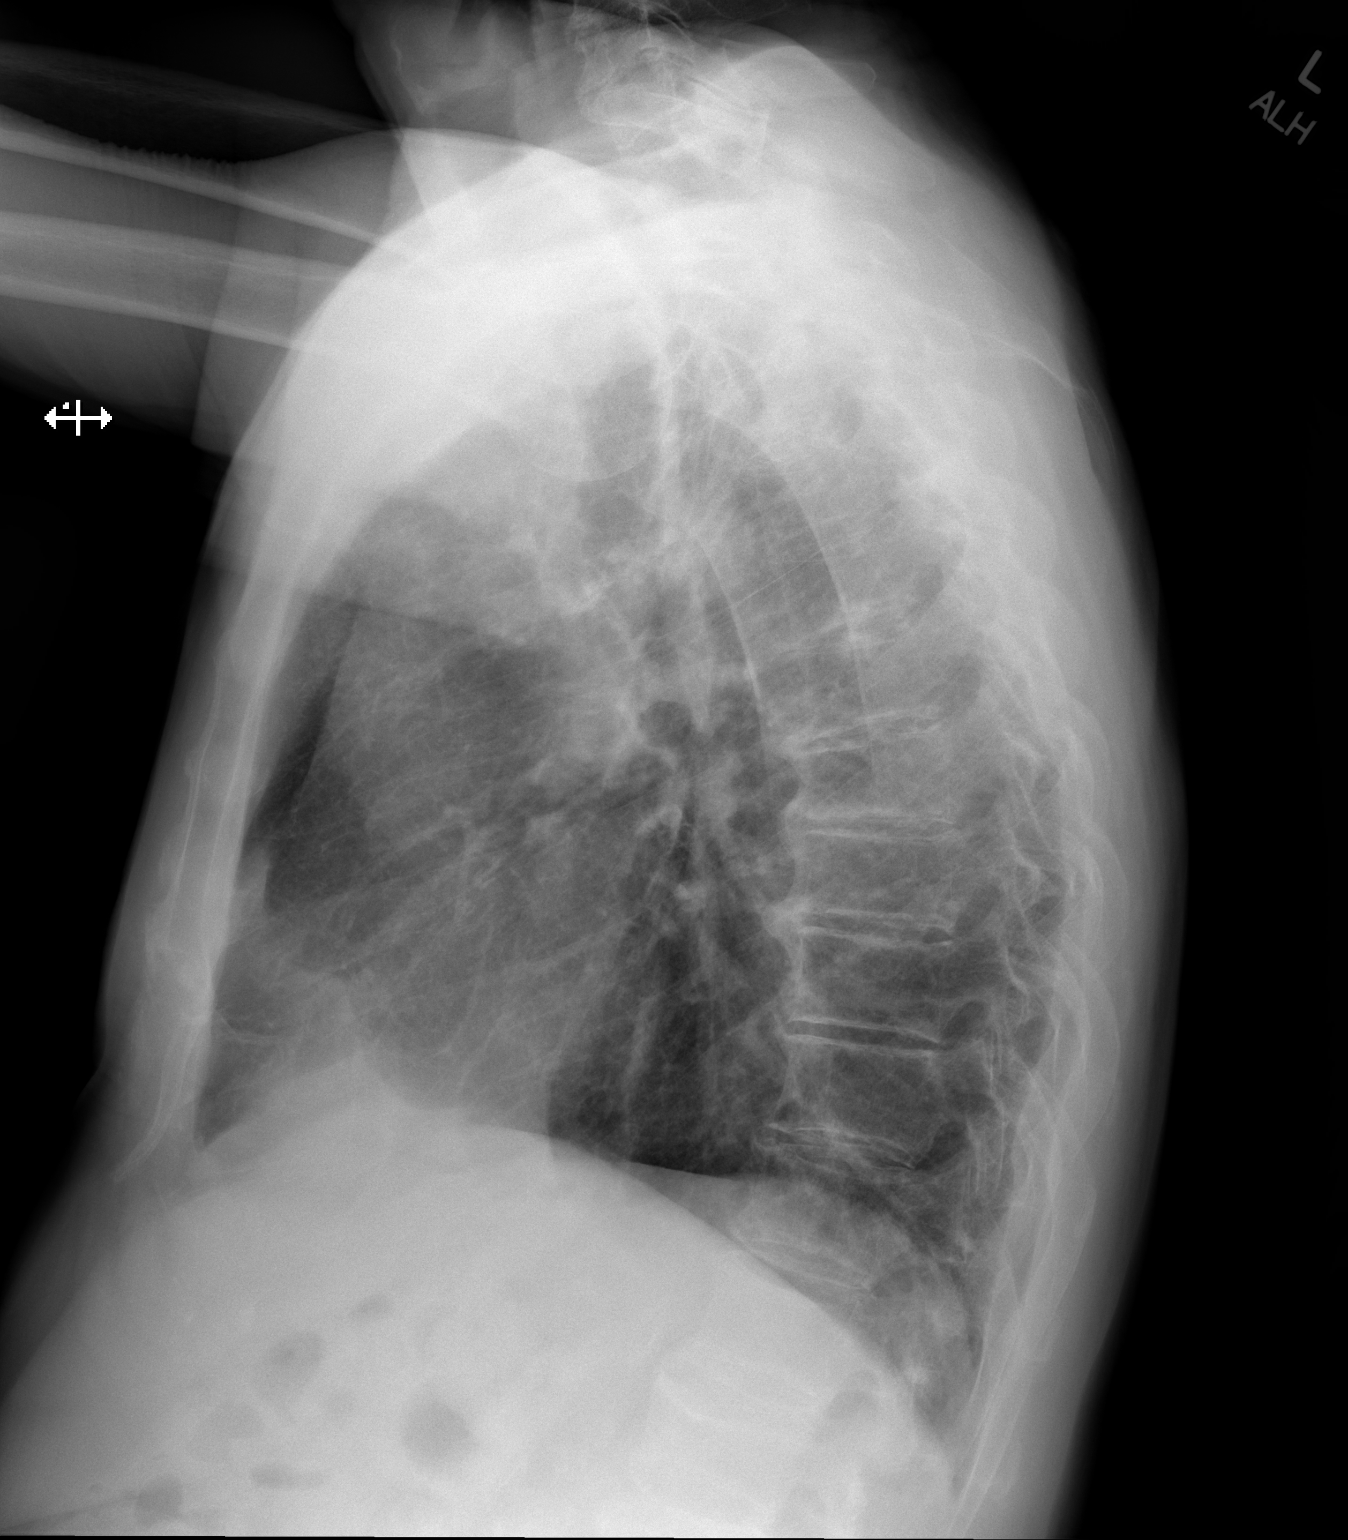

[2 of 2 positions shown; findings below may reference images not displayed]

FINDINGS: Cardiomediastinal silhouette unchanged in size and contour.

Pleuroparenchymal thickening at the apices persists.

No interlobular septal thickening.  No central vascular congestion.

Coarsened interstitial markings of the lungs persist.

Similar appearance of ill-defined opacity at the left lung base with
no significant change. No new airspace disease.

Degenerative changes of the spine.  No displaced fracture.
IMPRESSION: Persisting ill-defined opacity at the left lung base. Further
evaluation with chest CT recommended given the patient's of parent
risk factors.

## 2023-04-10 MED ORDER — LOSARTAN POTASSIUM 100 MG PO TABS
100.0000 mg | ORAL_TABLET | Freq: Every day | ORAL | 3 refills | Status: DC
  Filled 2023-04-10: qty 90, 90d supply, fill #0
  Filled 2023-07-05: qty 90, 90d supply, fill #1
  Filled 2023-10-14: qty 90, 90d supply, fill #2

## 2023-04-10 MED ORDER — MONTELUKAST SODIUM 10 MG PO TABS
10.0000 mg | ORAL_TABLET | Freq: Every day | ORAL | 3 refills | Status: AC
  Filled 2023-04-10: qty 90, 90d supply, fill #0
  Filled 2023-07-05: qty 90, 90d supply, fill #1
  Filled 2023-10-14: qty 90, 90d supply, fill #2
  Filled 2024-01-17: qty 90, 90d supply, fill #3

## 2023-04-10 MED ORDER — FAMOTIDINE 40 MG PO TABS
40.0000 mg | ORAL_TABLET | Freq: Two times a day (BID) | ORAL | 3 refills | Status: DC
Start: 2023-04-10 — End: 2023-06-18
  Filled 2023-04-10: qty 180, fill #0

## 2023-04-10 MED ORDER — ROSUVASTATIN CALCIUM 40 MG PO TABS
40.0000 mg | ORAL_TABLET | Freq: Every day | ORAL | 3 refills | Status: AC
  Filled 2023-04-10: qty 90, 90d supply, fill #0
  Filled 2023-07-05: qty 90, 90d supply, fill #1
  Filled 2023-10-14: qty 90, 90d supply, fill #2
  Filled 2024-01-17: qty 90, 90d supply, fill #3

## 2023-04-10 MED ORDER — TRELEGY ELLIPTA 200-62.5-25 MCG/ACT IN AEPB
1.0000 | INHALATION_SPRAY | Freq: Every day | RESPIRATORY_TRACT | 3 refills | Status: AC
  Filled 2023-04-10: qty 180, 90d supply, fill #0

## 2023-04-10 MED ORDER — DILTIAZEM HCL ER 240 MG PO CP24
240.0000 mg | ORAL_CAPSULE | Freq: Every day | ORAL | 3 refills | Status: AC
  Filled 2023-04-10: qty 90, 90d supply, fill #0
  Filled 2023-07-05: qty 90, 90d supply, fill #1
  Filled 2023-10-14: qty 90, 90d supply, fill #2
  Filled 2024-01-17: qty 90, 90d supply, fill #3

## 2023-04-10 MED ORDER — GLIPIZIDE 5 MG PO TABS
2.5000 mg | ORAL_TABLET | Freq: Two times a day (BID) | ORAL | 3 refills | Status: DC
  Filled 2023-04-10: qty 180, 90d supply, fill #0

## 2023-04-10 MED ORDER — GABAPENTIN 300 MG PO CAPS
300.0000 mg | ORAL_CAPSULE | Freq: Every evening | ORAL | 3 refills | Status: DC | PRN
  Filled 2023-04-10: qty 90, 90d supply, fill #0

## 2023-04-10 MED ORDER — FAMOTIDINE 40 MG PO TABS
40.0000 mg | ORAL_TABLET | Freq: Two times a day (BID) | ORAL | 3 refills | Status: DC
  Filled 2023-04-10: qty 180, 90d supply, fill #0

## 2023-04-10 NOTE — Telephone Encounter (Signed)
 Due to the new year, wife has requested all medications be sent over to [facility] [address] [place], [place], [place], [address]

## 2023-04-10 NOTE — Telephone Encounter (Signed)
 I sent the prescriptions in for her

## 2023-04-11 ENCOUNTER — Other Ambulatory Visit (HOSPITAL_COMMUNITY): Payer: Self-pay

## 2023-04-11 ENCOUNTER — Other Ambulatory Visit: Payer: Self-pay

## 2023-04-12 IMAGING — CT CT CHEST W/O CM
2 of 5 series · 15 of 36 positions shown, 18 images · non-contrast
Comparison: Chest radiograph 05/15/2021, 04/19/2021. Chest CT
08/12/2019 reviewed.

CLINICAL DATA: Abnormal x-ray with lung opacity/opacities.
Persisting ill-defined opacity at the left lung base.



[Series 4: chest 2.00 br40 s3 · coronal · 0.68mm/px · 3 of 152 slices shown]
[im 31/152  lung]
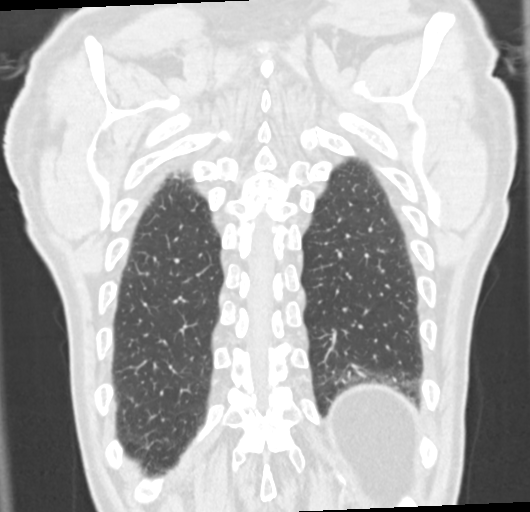
[im 61/152  lung]
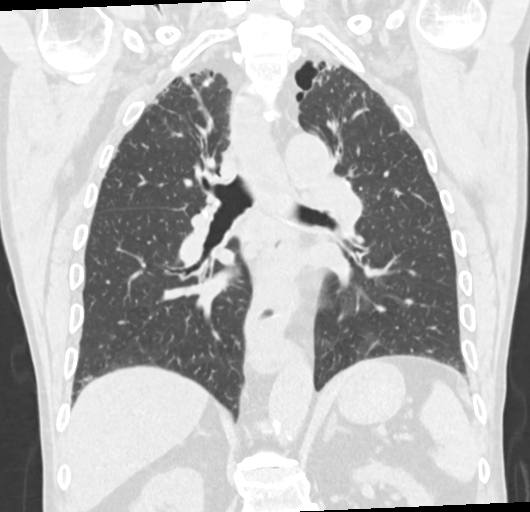
[im 91/152  lung]
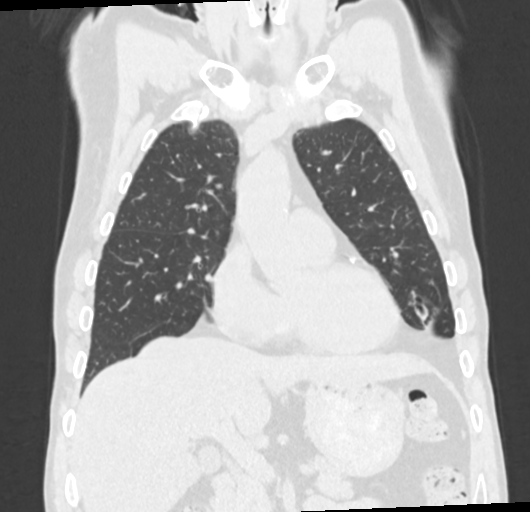

[Series 10: chest 1.00 br40 s3 super d · axial · 0.70mm/px · z∈[+1456,+1756]mm · 12 of 433 slices shown, 15 images]
[im 29/433  mediastinal]
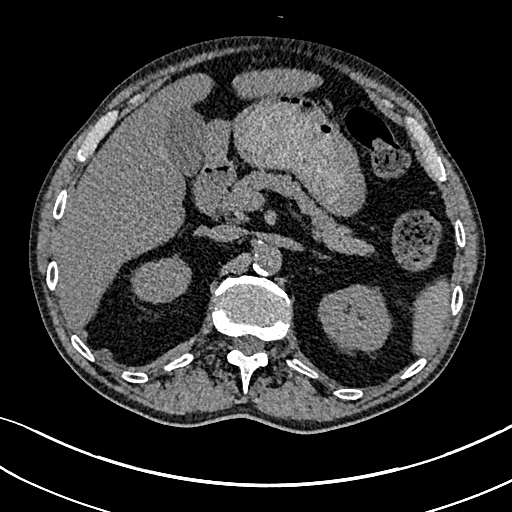
[im 29/433  lung]
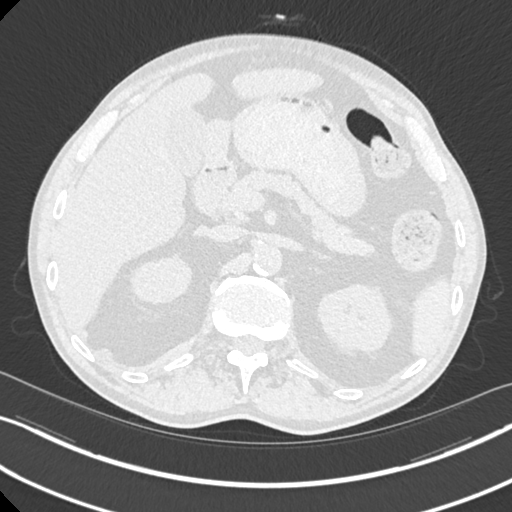
[im 58/433  lung]
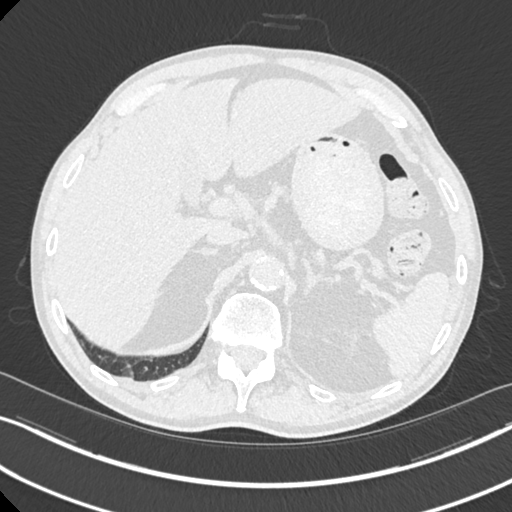
[im 87/433  lung]
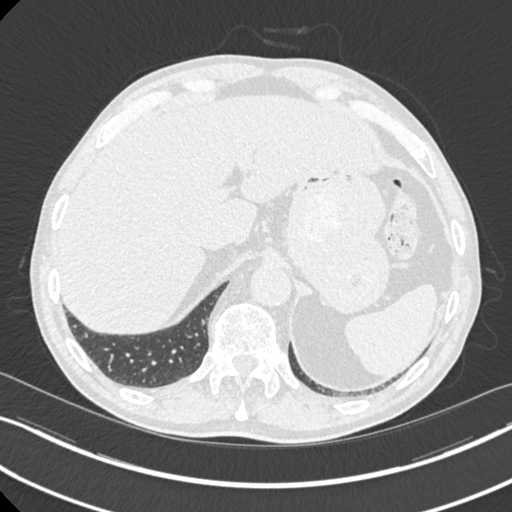
[im 145/433  lung]
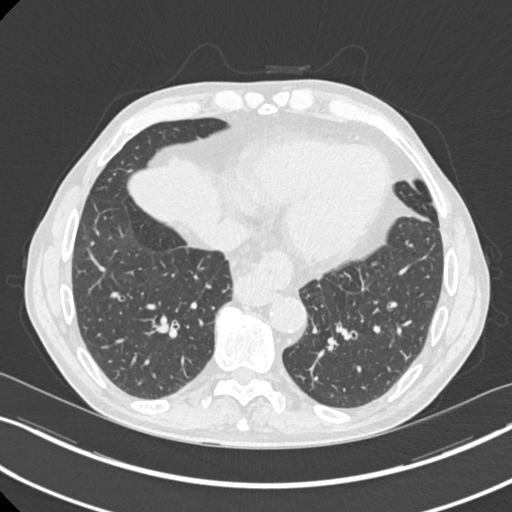
[im 173/433  mediastinal]
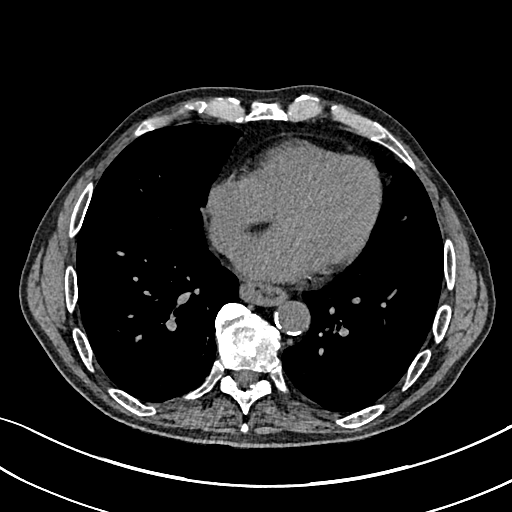
[im 173/433  lung]
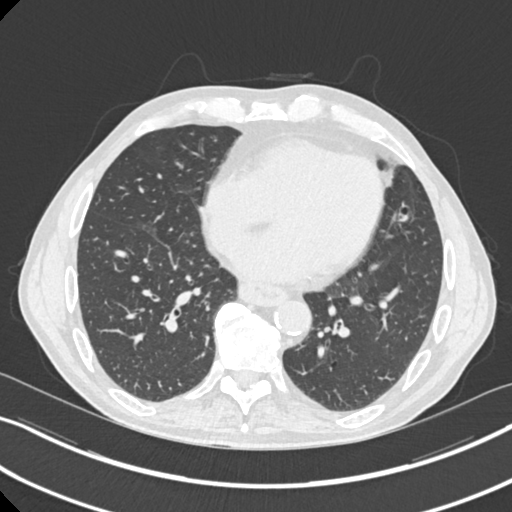
[im 202/433  lung]
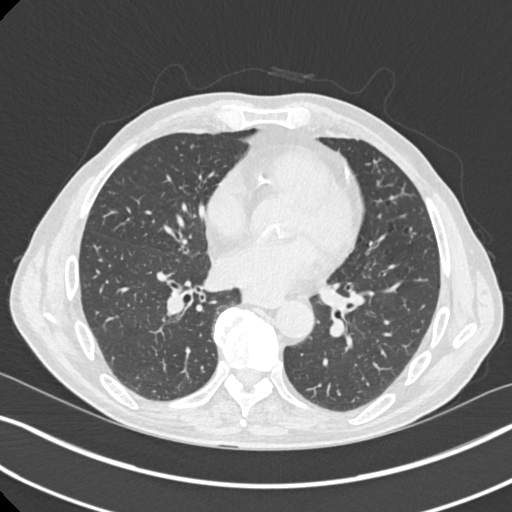
[im 231/433  lung]
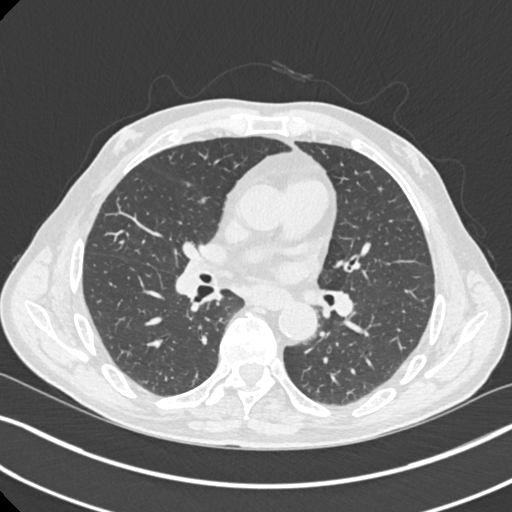
[im 260/433  lung]
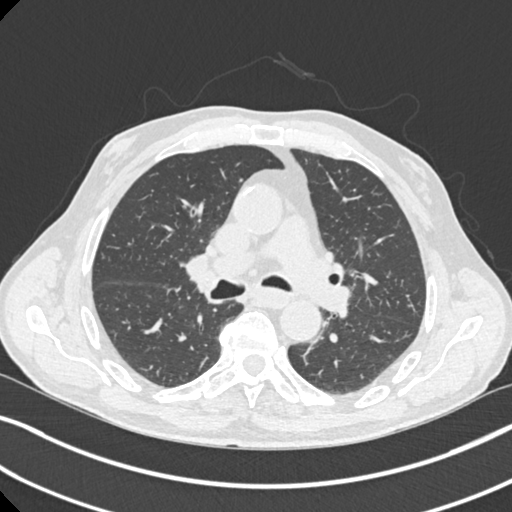
[im 289/433  mediastinal]
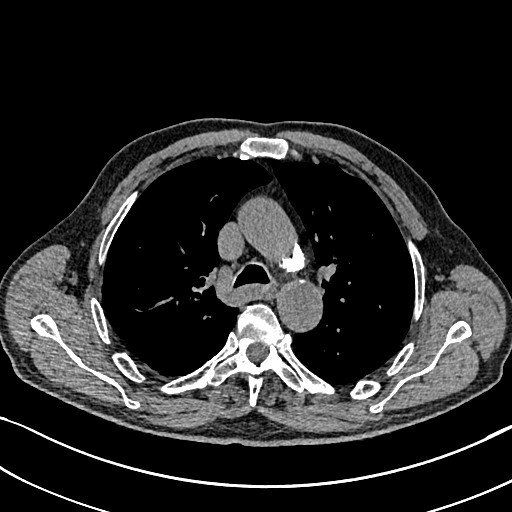
[im 289/433  lung]
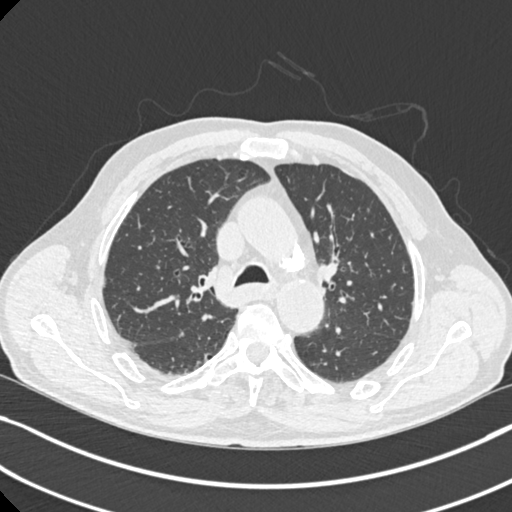
[im 346/433  lung]
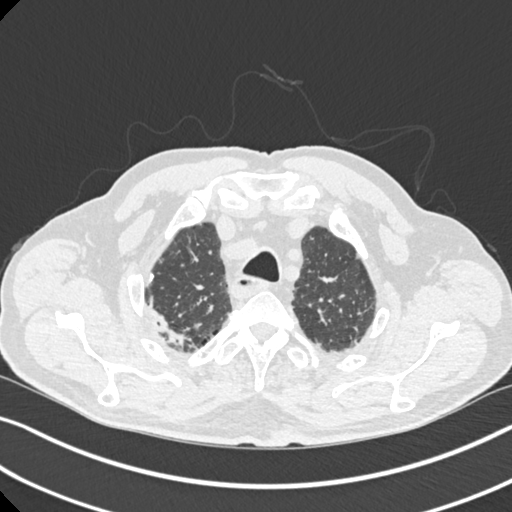
[im 375/433  lung]
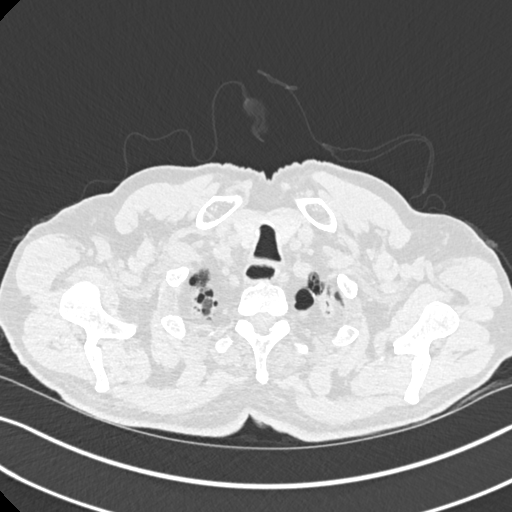
[im 404/433  lung]
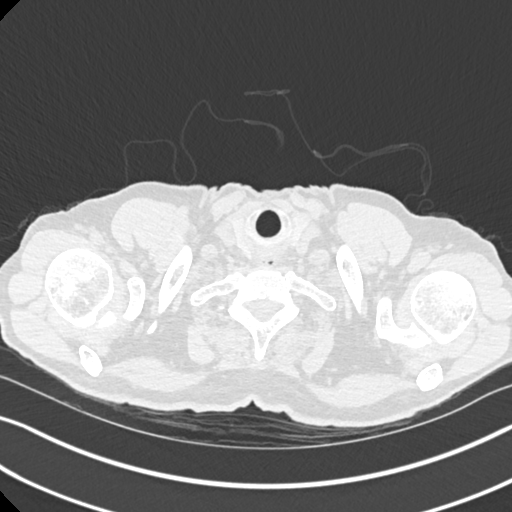

[15 of 36 positions shown; findings below may reference images not displayed]

FINDINGS: Cardiovascular: Heart is normal in size. There are coronary artery
calcifications. Thoracic aorta is normal in caliber with moderate
calcified atheromatous plaque. Normal caliber of central pulmonary
arteries. No pericardial effusion

Mediastinum/Nodes: There are scattered small mediastinal lymph nodes
are not enlarged by size criteria. Occasional calcified mediastinal
nodes. There are calcified right hilar nodes. Overall hilar
assessment is limited in the absence of IV contrast. Small to
moderate-sized hiatal hernia. The esophagus is patulous and contains
intraluminal fluid to the level of the thoracic inlet. No visualized
thyroid nodule.

Lungs/Pleura: Mild emphysema. Biapical pleuroparenchymal scarring.
There is prominent central bronchial thickening. Occasional areas of
mucous plugging in the lower lobes. Left lung base opacity on
radiographs corresponds to focal bronchiectasis with some
intraluminal mucus and minimal distal atelectasis. There is no
suspicious pulmonary nodule or mass in this region.

Subpleural nodule in the superior segment of the right lower lobe
measures 12 x 4 mm, series 8, image 56. This is unchanged from 0503
exam as well as CT from 7373, considered benign. Scattered
additional small pulmonary nodules are stable from prior exam.

No focal consolidation, pleural effusion, or findings of pulmonary
edema.

Upper Abdomen: Small to moderate-sized hiatal hernia has slightly
increased from prior exam. Small low-density in the anterior left
lobe of the liver is too small to characterize but likely small
cyst. There is upper abdominal aortic atherosclerosis. No acute
upper abdominal findings.

Musculoskeletal: Mild thoracic spondylosis with Schmorl's nodes.
Slight L1 superior endplate deformity is unchanged. There are no
acute or suspicious osseous abnormalities. Bilateral gynecomastia.
IMPRESSION: 1. Left lung base opacity on radiographs corresponds to focal
bronchiectasis with some intraluminal mucus and minimal distal
atelectasis. No suspicious pulmonary nodule or mass in this region.
2. Additional pulmonary nodules are stable from February 2020 lung
cancer screening exam.
3. Emphysema with prominent central bronchial thickening.
4. Small to moderate-sized hiatal hernia. Patulous esophagus with
intraluminal fluid to the level of the thoracic inlet, can be seen
with reflux.
5. Aortic atherosclerosis.  Coronary artery calcifications.

Aortic Atherosclerosis (E3HWX-1TW.W) and Emphysema (E3HWX-A7T.V).

## 2023-05-15 ENCOUNTER — Ambulatory Visit: Payer: Medicare HMO | Admitting: Nurse Practitioner

## 2023-05-22 ENCOUNTER — Ambulatory Visit: Payer: Self-pay | Admitting: Family Medicine

## 2023-06-12 ENCOUNTER — Other Ambulatory Visit: Payer: Self-pay

## 2023-06-12 ENCOUNTER — Encounter: Payer: Self-pay | Admitting: Family Medicine

## 2023-06-12 ENCOUNTER — Other Ambulatory Visit (HOSPITAL_COMMUNITY): Payer: Self-pay

## 2023-06-12 ENCOUNTER — Ambulatory Visit: Payer: Self-pay | Admitting: Family Medicine

## 2023-06-12 VITALS — BP 120/60 | HR 76 | Temp 97.9°F | Ht 69.0 in | Wt 170.0 lb

## 2023-06-12 DIAGNOSIS — N182 Chronic kidney disease, stage 2 (mild): Secondary | ICD-10-CM

## 2023-06-12 DIAGNOSIS — E1122 Type 2 diabetes mellitus with diabetic chronic kidney disease: Secondary | ICD-10-CM

## 2023-06-12 DIAGNOSIS — E1165 Type 2 diabetes mellitus with hyperglycemia: Secondary | ICD-10-CM

## 2023-06-12 DIAGNOSIS — I700 Atherosclerosis of aorta: Secondary | ICD-10-CM

## 2023-06-12 DIAGNOSIS — E1169 Type 2 diabetes mellitus with other specified complication: Secondary | ICD-10-CM | POA: Diagnosis not present

## 2023-06-12 DIAGNOSIS — K219 Gastro-esophageal reflux disease without esophagitis: Secondary | ICD-10-CM

## 2023-06-12 DIAGNOSIS — I1310 Hypertensive heart and chronic kidney disease without heart failure, with stage 1 through stage 4 chronic kidney disease, or unspecified chronic kidney disease: Secondary | ICD-10-CM | POA: Diagnosis not present

## 2023-06-12 DIAGNOSIS — Z1159 Encounter for screening for other viral diseases: Secondary | ICD-10-CM

## 2023-06-12 DIAGNOSIS — E785 Hyperlipidemia, unspecified: Secondary | ICD-10-CM

## 2023-06-12 DIAGNOSIS — I10 Essential (primary) hypertension: Secondary | ICD-10-CM

## 2023-06-12 DIAGNOSIS — I480 Paroxysmal atrial fibrillation: Secondary | ICD-10-CM

## 2023-06-12 DIAGNOSIS — Z7689 Persons encountering health services in other specified circumstances: Secondary | ICD-10-CM

## 2023-06-12 DIAGNOSIS — J449 Chronic obstructive pulmonary disease, unspecified: Secondary | ICD-10-CM

## 2023-06-12 DIAGNOSIS — Z2821 Immunization not carried out because of patient refusal: Secondary | ICD-10-CM

## 2023-06-12 MED ORDER — EMPAGLIFLOZIN 10 MG PO TABS
10.0000 mg | ORAL_TABLET | Freq: Every day | ORAL | 6 refills | Status: DC
  Filled 2023-06-12: qty 30, 30d supply, fill #0
  Filled 2023-07-19 (×2): qty 30, 30d supply, fill #1
  Filled 2023-09-03: qty 30, 30d supply, fill #2
  Filled 2023-10-14: qty 30, 30d supply, fill #3
  Filled 2023-11-09: qty 30, 30d supply, fill #4
  Filled 2023-12-13: qty 30, 30d supply, fill #5
  Filled 2024-01-17: qty 30, 30d supply, fill #6

## 2023-06-12 NOTE — Patient Instructions (Signed)
 Hypertension, Adult Hypertension is another name for high blood pressure. High blood pressure forces your heart to work harder to pump blood. This can cause problems over time. There are two numbers in a blood pressure reading. There is a top number (systolic) over a bottom number (diastolic). It is best to have a blood pressure that is below 120/80. What are the causes? The cause of this condition is not known. Some other conditions can lead to high blood pressure. What increases the risk? Some lifestyle factors can make you more likely to develop high blood pressure: Smoking. Not getting enough exercise or physical activity. Being overweight. Having too much fat, sugar, calories, or salt (sodium) in your diet. Drinking too much alcohol. Other risk factors include: Having any of these conditions: Heart disease. Diabetes. High cholesterol. Kidney disease. Obstructive sleep apnea. Having a family history of high blood pressure and high cholesterol. Age. The risk increases with age. Stress. What are the signs or symptoms? High blood pressure may not cause symptoms. Very high blood pressure (hypertensive crisis) may cause: Headache. Fast or uneven heartbeats (palpitations). Shortness of breath. Nosebleed. Vomiting or feeling like you may vomit (nauseous). Changes in how you see. Very bad chest pain. Feeling dizzy. Seizures. How is this treated? This condition is treated by making healthy lifestyle changes, such as: Eating healthy foods. Exercising more. Drinking less alcohol. Your doctor may prescribe medicine if lifestyle changes do not help enough and if: Your top number is above 130. Your bottom number is above 80. Your personal target blood pressure may vary. Follow these instructions at home: Eating and drinking  If told, follow the DASH eating plan. To follow this plan: Fill one half of your plate at each meal with fruits and vegetables. Fill one fourth of your plate  at each meal with whole grains. Whole grains include whole-wheat pasta, brown rice, and whole-grain bread. Eat or drink low-fat dairy products, such as skim milk or low-fat yogurt. Fill one fourth of your plate at each meal with low-fat (lean) proteins. Low-fat proteins include fish, chicken without skin, eggs, beans, and tofu. Avoid fatty meat, cured and processed meat, or chicken with skin. Avoid pre-made or processed food. Limit the amount of salt in your diet to less than 1,500 mg each day. Do not drink alcohol if: Your doctor tells you not to drink. You are pregnant, may be pregnant, or are planning to become pregnant. If you drink alcohol: Limit how much you have to: 0-1 drink a day for women. 0-2 drinks a day for men. Know how much alcohol is in your drink. In the [place] one drink equals one 12 oz bottle of beer (355 mL), one 5 oz glass of wine (148 mL), or one 1 oz glass of hard liquor (44 mL). Lifestyle  Work with your doctor to stay at a healthy weight or to lose weight. Ask your doctor what the best weight is for you. Get at least 30 minutes of exercise that causes your heart to beat faster (aerobic exercise) most days of the week. This may include walking, swimming, or biking. Get at least 30 minutes of exercise that strengthens your muscles (resistance exercise) at least 3 days a week. This may include lifting weights or doing Pilates. Do not smoke or use any products that contain nicotine or tobacco. If you need help quitting, ask your doctor. Check your blood pressure at home as told by your doctor. Keep all follow-up visits. Medicines Take over-the-counter and prescription medicines  only as told by your doctor. Follow directions carefully. Do not skip doses of blood pressure medicine. The medicine does not work as well if you skip doses. Skipping doses also puts you at risk for problems. Ask your doctor about side effects or reactions to medicines that you should watch  for. Contact a doctor if: You think you are having a reaction to the medicine you are taking. You have headaches that keep coming back. You feel dizzy. You have swelling in your ankles. You have trouble with your vision. Get help right away if: You get a very bad headache. You start to feel mixed up (confused). You feel weak or numb. You feel faint. You have very bad pain in your: Chest. Belly (abdomen). You vomit more than once. You have trouble breathing. These symptoms may be an emergency. Get help right away. Call 911. Do not wait to see if the symptoms will go away. Do not drive yourself to the hospital. Summary Hypertension is another name for high blood pressure. High blood pressure forces your heart to work harder to pump blood. For most people, a normal blood pressure is less than 120/80. Making healthy choices can help lower blood pressure. If your blood pressure does not get lower with healthy choices, you may need to take medicine. This information is not intended to replace advice given to you by your health care provider. Make sure you discuss any questions you have with your health care provider. Document Revised: 12/29/2020 Document Reviewed: 12/29/2020 Elsevier Patient Education  2024 [employer].

## 2023-06-12 NOTE — Progress Notes (Addendum)
 I,[name], CMA,acting as a Neurosurgeon for [employer], NP.,have documented all relevant documentation on the behalf of Ellender Hose, NP,as directed by  Ellender Hose, NP while in the presence of Ellender Hose, NP.  Subjective:  Patient ID: Jesus Bush , male    DOB: [birth year] , 78 y.o.   MRN: [id number]  Chief Complaint  Patient presents with   Establish Care   Diabetes   Hypertension    HPI  Patient is a 78 year old old who presents today to establish primary care because his previous PCP Dr Oneta Rack passed away. He is accompanied by his spouse . He reports that he has a diagnosis of Hypertension, Diabetes, GERD, COPD. Patient states that in the past few months his GERD is not properly controlled, he is on famotidine 40 mg twice daily, will add Voquezna 10 mg every day as adjunct treatment. He denies side effects from any of his other medications.   Diabetes He presents for his follow-up diabetic visit. He has type 2 diabetes mellitus. No MedicAlert identification noted. His disease course has been stable. There are no hypoglycemic associated symptoms. Associated symptoms include foot paresthesias. Pertinent negatives for diabetes include no chest pain, no polydipsia, no polyphagia and no polyuria. There are no hypoglycemic complications. Diabetic complications include peripheral neuropathy. Risk factors for coronary artery disease include male sex, hypertension and dyslipidemia. His home blood glucose trend is fluctuating minimally. His breakfast blood glucose is taken between 9-10 am. His breakfast blood glucose range is generally 110-130 mg/dl.  Hypertension Pertinent negatives include no chest pain.     Past Medical History:  Diagnosis Date   COPD (chronic obstructive pulmonary disease) (HCC)    Diabetes mellitus without complication (HCC)    Hyperlipidemia    Hypertension    Vitamin D deficiency      Family History  Problem Relation Age of Onset   Stroke Mother    Heart  disease Father    Heart attack Father    Diabetes Sister    Heart disease Sister    Heart attack Brother    Hypertension Brother    Colon cancer Neg Hx    Stomach cancer Neg Hx    Esophageal cancer Neg Hx      Current Outpatient Medications:    acetaminophen (TYLENOL) 500 MG tablet, Take 1,000 mg by mouth daily as needed for moderate pain (pain score 4-6)., Disp: , Rfl:    diltiazem (DILACOR XR) 240 MG 24 hr capsule, Take 1 capsule (240 mg total) by mouth daily FOR BLOOD PRESSURE AND HEART RHYTHM, Disp: 90 capsule, Rfl: 3   empagliflozin (JARDIANCE) 10 MG TABS tablet, Take 1 tablet (10 mg total) by mouth daily before breakfast., Disp: 30 tablet, Rfl: 6   Fluticasone-Umeclidin-Vilant (TRELEGY ELLIPTA) 200-62.5-25 MCG/ACT AEPB, Inhale 1 puff into the lungs daily for COPD /Asthma, Disp: 180 each, Rfl: 3   gabapentin (NEURONTIN) 300 MG capsule, Take 1 capsule (300 mg total) by mouth at 1-2 hours before bedtime as needed., Disp: 90 capsule, Rfl: 3   glipiZIDE (GLUCOTROL) 5 MG tablet, Take 0.5-1 tablets (2.5-5 mg total) by mouth 2 (two) times daily with a meal for diabetes., Disp: 180 tablet, Rfl: 3   losartan (COZAAR) 100 MG tablet, Take 1 tablet (100 mg total) by mouth daily for blood pressure., Disp: 90 tablet, Rfl: 3   magnesium gluconate (MAGONATE) 500 MG tablet, Take 500 mg by mouth daily. , Disp: , Rfl:    montelukast (SINGULAIR) 10 MG  tablet, Take 1 tablet (10 mg total) by mouth daily for allergies., Disp: 90 tablet, Rfl: 3   rosuvastatin (CRESTOR) 40 MG tablet, Take 1 tablet (40 mg total) by mouth daily for cholesterol., Disp: 90 tablet, Rfl: 3   VITAMIN D PO, Take 5,000 Units by mouth daily., Disp: , Rfl:    benzonatate (TESSALON PERLES) 100 MG capsule, Take 1 capsule (100 mg total) by mouth 3 (three) times daily as needed., Disp: 30 capsule, Rfl: 0   cetirizine (ZYRTEC ALLERGY) 10 MG tablet, Take one tablet nightly for allergies., Disp: 90 tablet, Rfl: 1   famotidine (PEPCID) 40 MG  tablet, Take 1 tablet (40 mg total) by mouth 2 (two) times daily to prevent heartburn & indigestion, Disp: 180 tablet, Rfl: 3   guaiFENesin-dextromethorphan (ROBITUSSIN DM) 100-10 MG/5ML syrup, Take 5 mLs by mouth every 4 (four) hours as needed for cough., Disp: 118 mL, Rfl: 0   Vonoprazan Fumarate (VOQUEZNA) 10 MG TABS, Take 1 tablet by mouth daily., Disp: 30 tablet, Rfl: 5   Allergies  Allergen Reactions   Ppd [Tuberculin Purified Protein Derivative]     Positive PPD.     Review of Systems  Constitutional: Negative.   Eyes: Negative.   Cardiovascular:  Negative for chest pain.  Endocrine: Negative for polydipsia, polyphagia and polyuria.  Genitourinary: Negative.   Musculoskeletal: Negative.   Skin: Negative.   Psychiatric/Behavioral: Negative.       Today's Vitals   06/12/23 1452  BP: 120/60  Pulse: 76  Temp: 97.9 F (36.6 C)  TempSrc: Oral  Weight: 170 lb (77.1 kg)  Height: 5\' 9"  (1.753 m)  PainSc: 0-No pain   Body mass index is 25.1 kg/m.  Wt Readings from Last 3 Encounters:  06/26/23 167 lb (75.8 kg)  06/12/23 170 lb (77.1 kg)  02/19/23 168 lb 9.6 oz (76.5 kg)    The 10-year ASCVD risk score (Arnett DK, et al., 2019) is: 50.5%   Values used to calculate the score:     Age: 33 years     Sex: Male     Is Non-Hispanic African American: No     Diabetic: Yes     Tobacco smoker: No     Systolic Blood Pressure: 120 mmHg     Is BP treated: Yes     HDL Cholesterol: 36 mg/dL     Total Cholesterol: 137 mg/dL  Objective:  Physical Exam HENT:     Head: Normocephalic.  Cardiovascular:     Rate and Rhythm: Normal rate.  Pulmonary:     Effort: Pulmonary effort is normal.     Breath sounds: Normal breath sounds.  Neurological:     Mental Status: He is alert.         Assessment And Plan:  Establishing care with new doctor, encounter for  Hyperlipidemia associated with type 2 diabetes mellitus (HCC)  Chronic obstructive pulmonary disease, unspecified COPD  type (HCC) Assessment & Plan: Chronic. On Trelegy 1 puff daily   Encounter for hepatitis C screening test for low risk patient -     Hepatitis C antibody  Herpes zoster vaccination declined  Gastroesophageal reflux disease without esophagitis  Aortic atherosclerosis (HCC) Assessment & Plan: Per CT 01/2019.On Crestor 40 mg every day.  Orders: -     Lipid panel  Paroxysmal atrial fibrillation (HCC) Assessment & Plan: On diltiazem 240 XR every day.   Type 2 diabetes mellitus with stage 2 chronic kidney disease, without long-term current use of insulin (HCC) Assessment &  Plan: Chronic. Continue Glipizide 5 mg twice daily and Jardiance 10 mg every day.  Orders: -     Hemoglobin A1c -     Microalbumin / creatinine urine ratio -     EKG 12-Lead -     Empagliflozin; Take 1 tablet (10 mg total) by mouth daily before breakfast.  Dispense: 30 tablet; Refill: 6  Hypertensive heart/kidney disease with chronic kidney disease stage II Assessment & Plan: Encouraged to keep BP well controlled and avoid use of NSAIDs, continue losartan 100 mg every day   Orders: -     CBC -     CMP14+EGFR  Essential hypertension Assessment & Plan: Encouraged to keep BP well controlled and avoid use of NSAIDs, continue losartan 100 mg every day      Return in about 3 months (around 09/12/2023) for  blood pressure, diabetes.  Patient was given opportunity to ask questions. Patient verbalized understanding of the plan and was able to repeat key elements of the plan. All questions were answered to their satisfaction.    I, Ellender Hose, NP, have reviewed all documentation for this visit. The documentation on 06/20/2023 for the exam, diagnosis, procedures, and orders are all accurate and complete.    IF YOU HAVE BEEN REFERRED TO A SPECIALIST, IT MAY TAKE 1-2 WEEKS TO SCHEDULE/PROCESS THE REFERRAL. IF YOU HAVE NOT HEARD FROM US/SPECIALIST IN TWO WEEKS, PLEASE GIVE [place] A CALL AT [phone].

## 2023-06-13 LAB — LIPID PANEL
Chol/HDL Ratio: 3.8 ratio (ref 0.0–5.0)
Cholesterol, Total: 137 mg/dL (ref 100–199)
HDL: 36 mg/dL — ABNORMAL LOW (ref >39)
LDL Chol Calc (NIH): 76 mg/dL (ref 0–99)
Triglycerides: 144 mg/dL (ref 0–149)
VLDL Cholesterol Cal: 25 mg/dL (ref 5–40)

## 2023-06-13 LAB — CMP14+EGFR
ALT: 16 IU/L (ref 0–44)
AST: 14 IU/L (ref 0–40)
Albumin: 4.3 g/dL (ref 3.8–4.8)
Alkaline Phosphatase: 92 IU/L (ref 44–121)
BUN/Creatinine Ratio: 12 (ref 10–24)
BUN: 13 mg/dL (ref 8–27)
Bilirubin Total: <0.2 mg/dL (ref 0.0–1.2)
CO2: 22 mmol/L (ref 20–29)
Calcium: 9.5 mg/dL (ref 8.6–10.2)
Chloride: 105 mmol/L (ref 96–106)
Creatinine, Ser: 1.06 mg/dL (ref 0.76–1.27)
Globulin, Total: 2.3 g/dL (ref 1.5–4.5)
Glucose: 115 mg/dL — ABNORMAL HIGH (ref 70–99)
Potassium: 3.9 mmol/L (ref 3.5–5.2)
Sodium: 143 mmol/L (ref 134–144)
Total Protein: 6.6 g/dL (ref 6.0–8.5)
eGFR: 72 mL/min/{1.73_m2} (ref >59)

## 2023-06-13 LAB — CBC
Hematocrit: 37.1 % — ABNORMAL LOW (ref 37.5–51.0)
Hemoglobin: 11.7 g/dL — ABNORMAL LOW (ref 13.0–17.7)
MCH: 28.7 pg (ref 26.6–33.0)
MCHC: 31.5 g/dL (ref 31.5–35.7)
MCV: 91 fL (ref 79–97)
Platelets: 346 10*3/uL (ref 150–450)
RBC: 4.08 x10E6/uL — ABNORMAL LOW (ref 4.14–5.80)
RDW: 13.7 % (ref 11.6–15.4)
WBC: 7.5 10*3/uL (ref 3.4–10.8)

## 2023-06-13 LAB — HEPATITIS C ANTIBODY: Hep C Virus Ab: NONREACTIVE

## 2023-06-13 LAB — MICROALBUMIN / CREATININE URINE RATIO
Creatinine, Urine: 213.4 mg/dL
Microalb/Creat Ratio: 4 mg/g{creat} (ref 0–29)
Microalbumin, Urine: 7.9 ug/mL

## 2023-06-13 LAB — HEMOGLOBIN A1C
Est. average glucose Bld gHb Est-mCnc: 140 mg/dL
Hgb A1c MFr Bld: 6.5 % — ABNORMAL HIGH (ref 4.8–5.6)

## 2023-06-18 ENCOUNTER — Other Ambulatory Visit: Payer: Self-pay

## 2023-06-18 ENCOUNTER — Other Ambulatory Visit (HOSPITAL_COMMUNITY): Payer: Self-pay

## 2023-06-18 DIAGNOSIS — K219 Gastro-esophageal reflux disease without esophagitis: Secondary | ICD-10-CM

## 2023-06-18 MED ORDER — FAMOTIDINE 40 MG PO TABS
40.0000 mg | ORAL_TABLET | Freq: Two times a day (BID) | ORAL | 3 refills | Status: DC
  Filled 2023-06-18 – 2023-06-24 (×2): qty 180, 90d supply, fill #0

## 2023-06-19 ENCOUNTER — Other Ambulatory Visit (HOSPITAL_COMMUNITY): Payer: Self-pay

## 2023-06-19 ENCOUNTER — Telehealth: Payer: Self-pay | Admitting: Family Medicine

## 2023-06-19 NOTE — Telephone Encounter (Signed)
 Copied from CRM [id number]. Topic: Clinical - Medication Refill >> Jun 19, 2023  9:20 AM Vista Lawman wrote: Most Recent Primary Care Visit:  Provider: Moshe Salisbury, PAT  Department: [facility]  Visit Type: NEW PT - OFFICE VISIT  Date: 06/12/2023  Medication: voquezzna 20mg   Has the patient contacted their pharmacy? Yes (Agent: If no, request that the patient contact the pharmacy for the refill. If patient does not wish to contact the pharmacy document the reason why and proceed with request.) (Agent: If yes, when and what did the pharmacy advise?)  Is this the correct pharmacy for this prescription? Yes If no, delete pharmacy and type the correct one.  This is the patient's preferred pharmacy:  [facility] [address] [place] [address] Phone: [phone] Fax: [phone]   Has the prescription been filled recently? No  Is the patient out of the medication? Yes  Has the patient been seen for an appointment in the last year OR does the patient have an upcoming appointment? Yes  Can we respond through MyChart? Yes  Agent: Please be advised that Rx refills may take up to 3 business days. We ask that you follow-up with your pharmacy.

## 2023-06-20 DIAGNOSIS — E1169 Type 2 diabetes mellitus with other specified complication: Secondary | ICD-10-CM | POA: Insufficient documentation

## 2023-06-20 DIAGNOSIS — E1122 Type 2 diabetes mellitus with diabetic chronic kidney disease: Secondary | ICD-10-CM | POA: Insufficient documentation

## 2023-06-20 DIAGNOSIS — Z7689 Persons encountering health services in other specified circumstances: Secondary | ICD-10-CM | POA: Insufficient documentation

## 2023-06-20 DIAGNOSIS — Z1159 Encounter for screening for other viral diseases: Secondary | ICD-10-CM | POA: Insufficient documentation

## 2023-06-20 DIAGNOSIS — Z2821 Immunization not carried out because of patient refusal: Secondary | ICD-10-CM | POA: Insufficient documentation

## 2023-06-20 NOTE — Assessment & Plan Note (Addendum)
 Per CT 01/2019.On Crestor 40 mg every day.

## 2023-06-20 NOTE — Assessment & Plan Note (Addendum)
 Chronic. On Trelegy 1 puff daily

## 2023-06-20 NOTE — Assessment & Plan Note (Signed)
 On diltiazem 240 XR every day.

## 2023-06-20 NOTE — Assessment & Plan Note (Deleted)
 Chronic. Continue Crestor 40 mg every day.

## 2023-06-20 NOTE — Assessment & Plan Note (Signed)
 Chronic. Continue Glipizide 5 mg twice daily and Jardiance 10 mg every day.

## 2023-06-21 ENCOUNTER — Other Ambulatory Visit (HOSPITAL_COMMUNITY): Payer: Self-pay

## 2023-06-21 ENCOUNTER — Encounter: Payer: Self-pay | Admitting: Family Medicine

## 2023-06-21 NOTE — Progress Notes (Signed)
 All labs are stable. Iron levels are still slightly low, eat foods that are rich in iron e.g green leafy vegetables. A1c dropped to 6.5 , continue to minimize sweets in the diet and continue to take all medications as prescribed.  Thanks!

## 2023-06-24 ENCOUNTER — Ambulatory Visit: Payer: Self-pay

## 2023-06-24 ENCOUNTER — Other Ambulatory Visit (HOSPITAL_COMMUNITY): Payer: Self-pay

## 2023-06-24 ENCOUNTER — Other Ambulatory Visit: Payer: Self-pay

## 2023-06-24 NOTE — Telephone Encounter (Signed)
 Summary: cough, congestion   Copied From CRM [id number]. Reason for Triage: cough and congestion, causing patient to not sleep. Patient would like something called in for this. His appointment is not until 06/26/23.         Chief Complaint: cough, wheezing, yellow sputum Symptoms: see above Frequency: constant Pertinent Negatives: Patient denies fever, cp, sob Disposition: [] ED /[] Urgent Care (no appt availability in office) / [] Appointment(In office/virtual)/ []  [facility]/ [] Home Care/ [x] Refused Recommended Disposition /[] [place] [facility]/ []  Follow-up with PCP Additional Notes: declined apt; states has one scheduled on 4/2; requesting cough medicine be called in.  Care advice given, denies questions; instructed to go to ER if becomes worse.   Reason for Disposition  SEVERE coughing spells (e.g., whooping sound after coughing, vomiting after coughing)  Answer Assessment - Initial Assessment Questions 1. ONSET: "When did the cough begin?"      Last week 2. SEVERITY: "How bad is the cough today?"      7/10 3. SPUTUM: "Describe the color of your sputum" (none, dry cough; clear, white, yellow, green)     yellow 4. HEMOPTYSIS: "Are you coughing up any blood?" If so ask: "How much?" (flecks, streaks, tablespoons, etc.)     denies 5. DIFFICULTY BREATHING: "Are you having difficulty breathing?" If Yes, ask: "How bad is it?" (e.g., mild, moderate, severe)    - MILD: No SOB at rest, mild SOB with walking, speaks normally in sentences, can lie down, no retractions, pulse < 100.    - MODERATE: SOB at rest, SOB with minimal exertion and prefers to sit, cannot lie down flat, speaks in phrases, mild retractions, audible wheezing, pulse 100-120.    - SEVERE: Very SOB at rest, speaks in single words, struggling to breathe, sitting hunched forward, retractions, pulse > 120      wheezing 6. FEVER: "Do you have a fever?" If Yes, ask: "What is your temperature, how was it measured, and  when did it start?"     denies 7. CARDIAC HISTORY: "Do you have any history of heart disease?" (e.g., heart attack, congestive heart failure)      no 8. LUNG HISTORY: "Do you have any history of lung disease?"  (e.g., pulmonary embolus, asthma, emphysema)     no 9. PE RISK FACTORS: "Do you have a history of blood clots?" (or: recent major surgery, recent prolonged travel, bedridden)     no 10. OTHER SYMPTOMS: "Do you have any other symptoms?" (e.g., runny nose, wheezing, chest pain)       wheezing 11. PREGNANCY: "Is there any chance you are pregnant?" "When was your last menstrual period?"       na 12. TRAVEL: "Have you traveled out of the country in the last month?" (e.g., travel history, exposures)       no  Protocols used: Cough - Acute Productive-A-AH

## 2023-06-24 NOTE — Telephone Encounter (Signed)
 Copied from CRM [id number]. Topic: Clinical - Medication Question >> Jun 24, 2023  9:19 AM Higinio Roger wrote: Reason for CRM: Patient had samples of VOQUEZNA 20MG  given at the clinic for heartburn and stated they work really well for him and would like the medication called in for him.  Callback #: [phone]  Preferred Pharmacy:  [facility] [address] [place] [address]DGUYQ: [phone] Fax: [phone]: Mon-Fri 7:30am-6pm; Sat 8:00am-4:30pm

## 2023-06-25 ENCOUNTER — Other Ambulatory Visit (HOSPITAL_COMMUNITY): Payer: Self-pay

## 2023-06-25 ENCOUNTER — Other Ambulatory Visit: Payer: Self-pay | Admitting: Family Medicine

## 2023-06-25 MED ORDER — VOQUEZNA 10 MG PO TABS
1.0000 | ORAL_TABLET | Freq: Every day | ORAL | 5 refills | Status: DC
  Filled 2023-06-25: qty 30, 30d supply, fill #0

## 2023-06-26 ENCOUNTER — Other Ambulatory Visit (HOSPITAL_COMMUNITY): Payer: Self-pay

## 2023-06-26 ENCOUNTER — Encounter: Payer: Self-pay | Admitting: Family Medicine

## 2023-06-26 ENCOUNTER — Ambulatory Visit: Payer: Self-pay | Admitting: Family Medicine

## 2023-06-26 VITALS — BP 120/76 | HR 80 | Temp 98.2°F | Ht 69.0 in | Wt 167.0 lb

## 2023-06-26 DIAGNOSIS — K219 Gastro-esophageal reflux disease without esophagitis: Secondary | ICD-10-CM | POA: Diagnosis not present

## 2023-06-26 DIAGNOSIS — J301 Allergic rhinitis due to pollen: Secondary | ICD-10-CM | POA: Diagnosis not present

## 2023-06-26 DIAGNOSIS — E1165 Type 2 diabetes mellitus with hyperglycemia: Secondary | ICD-10-CM | POA: Insufficient documentation

## 2023-06-26 DIAGNOSIS — J208 Acute bronchitis due to other specified organisms: Secondary | ICD-10-CM

## 2023-06-26 MED ORDER — GUAIFENESIN-DM 100-10 MG/5ML PO SYRP: 5.0000 mL | ORAL_SOLUTION | ORAL | 0 refills | Status: DC | PRN

## 2023-06-26 MED ORDER — CETIRIZINE HCL 10 MG PO TABS: ORAL_TABLET | ORAL | 1 refills | Status: AC

## 2023-06-26 MED ORDER — BENZONATATE 100 MG PO CAPS: 100.0000 mg | ORAL_CAPSULE | Freq: Three times a day (TID) | ORAL | 0 refills | Status: DC | PRN

## 2023-06-26 MED ORDER — TRIAMCINOLONE ACETONIDE 40 MG/ML IJ SUSP
60.0000 mg | Freq: Once | INTRAMUSCULAR | Status: AC
  Administered 2023-06-26: 60 mg via INTRAMUSCULAR

## 2023-06-26 NOTE — Progress Notes (Signed)
 I,[name], CMA,acting as a Neurosurgeon for [employer], NP.,have documented all relevant documentation on the behalf of Ellender Hose, NP,as directed by  Ellender Hose, NP while in the presence of Ellender Hose, NP.  Subjective:  Patient ID: Jesus Bush , male    DOB: 07-Dec-1945 , 78 y.o.   MRN: [id number]  Chief Complaint  Patient presents with   URI    HPI  Patient is a 78 year old male who presents today for  cough and congestion that started a week ago. He states he had a fever sometime during he week and used tylenol( he is not sure , does not have a thermometer , he thinks so).  Patient reports he coughs up mucus and its clear and sometimes yellow. He denies any shortness of breath , he is still uses his Trelegy daily.  URI  This is a new problem. The current episode started in the past 7 days. The problem has been unchanged. There has been no fever. Associated symptoms include congestion, coughing, sinus pain and sneezing. Pertinent negatives include no rhinorrhea. He has tried NSAIDs for the symptoms. The treatment provided no relief.     Past Medical History:  Diagnosis Date   COPD (chronic obstructive pulmonary disease) (HCC)    Diabetes mellitus without complication (HCC)    Hyperlipidemia    Hypertension    Vitamin D deficiency      Family History  Problem Relation Age of Onset   Stroke Mother    Heart disease Father    Heart attack Father    Diabetes Sister    Heart disease Sister    Heart attack Brother    Hypertension Brother    Colon cancer Neg Hx    Stomach cancer Neg Hx    Esophageal cancer Neg Hx      Current Outpatient Medications:    acetaminophen (TYLENOL) 500 MG tablet, Take 1,000 mg by mouth daily as needed for moderate pain (pain score 4-6)., Disp: , Rfl:    benzonatate (TESSALON PERLES) 100 MG capsule, Take 1 capsule (100 mg total) by mouth 3 (three) times daily as needed., Disp: 30 capsule, Rfl: 0   diltiazem (DILACOR XR) 240 MG 24 hr  capsule, Take 1 capsule (240 mg total) by mouth daily FOR BLOOD PRESSURE AND HEART RHYTHM, Disp: 90 capsule, Rfl: 3   empagliflozin (JARDIANCE) 10 MG TABS tablet, Take 1 tablet (10 mg total) by mouth daily before breakfast., Disp: 30 tablet, Rfl: 6   famotidine (PEPCID) 40 MG tablet, Take 1 tablet (40 mg total) by mouth 2 (two) times daily to prevent heartburn & indigestion, Disp: 180 tablet, Rfl: 3   Fluticasone-Umeclidin-Vilant (TRELEGY ELLIPTA) 200-62.5-25 MCG/ACT AEPB, Inhale 1 puff into the lungs daily for COPD /Asthma, Disp: 180 each, Rfl: 3   gabapentin (NEURONTIN) 300 MG capsule, Take 1 capsule (300 mg total) by mouth at 1-2 hours before bedtime as needed., Disp: 90 capsule, Rfl: 3   glipiZIDE (GLUCOTROL) 5 MG tablet, Take 0.5-1 tablets (2.5-5 mg total) by mouth 2 (two) times daily with a meal for diabetes., Disp: 180 tablet, Rfl: 3   losartan (COZAAR) 100 MG tablet, Take 1 tablet (100 mg total) by mouth daily for blood pressure., Disp: 90 tablet, Rfl: 3   magnesium gluconate (MAGONATE) 500 MG tablet, Take 500 mg by mouth daily. , Disp: , Rfl:    montelukast (SINGULAIR) 10 MG tablet, Take 1 tablet (10 mg total) by mouth daily for allergies., Disp: 90 tablet,  Rfl: 3   rosuvastatin (CRESTOR) 40 MG tablet, Take 1 tablet (40 mg total) by mouth daily for cholesterol., Disp: 90 tablet, Rfl: 3   VITAMIN D PO, Take 5,000 Units by mouth daily., Disp: , Rfl:    cetirizine (ZYRTEC ALLERGY) 10 MG tablet, Take one tablet nightly for allergies., Disp: 90 tablet, Rfl: 1   guaiFENesin-dextromethorphan (ROBITUSSIN DM) 100-10 MG/5ML syrup, Take 5 mLs by mouth every 4 (four) hours as needed for cough., Disp: 118 mL, Rfl: 0   Vonoprazan Fumarate (VOQUEZNA) 10 MG TABS, Take 1 tablet by mouth daily., Disp: 30 tablet, Rfl: 5   Allergies  Allergen Reactions   Ppd [Tuberculin Purified Protein Derivative]     Positive PPD.     Review of Systems  Constitutional: Negative.  Negative for fatigue and fever.  HENT:   Positive for congestion, sinus pain and sneezing. Negative for rhinorrhea.   Respiratory:  Positive for cough. Negative for choking and chest tightness.   Musculoskeletal: Negative.   Skin: Negative.   Neurological: Negative.   Psychiatric/Behavioral: Negative.       Today's Vitals   06/26/23 1141  BP: 120/76  Pulse: 80  Temp: 98.2 F (36.8 C)  TempSrc: Oral  Weight: 167 lb (75.8 kg)  Height: 5\' 9"  (1.753 m)  PainSc: 0-No pain   Body mass index is 24.66 kg/m.  Wt Readings from Last 3 Encounters:  06/26/23 167 lb (75.8 kg)  06/12/23 170 lb (77.1 kg)  02/19/23 168 lb 9.6 oz (76.5 kg)    The 10-year ASCVD risk score (Arnett DK, et al., 2019) is: 50.5%   Values used to calculate the score:     Age: 61 years     Sex: Male     Is Non-Hispanic African American: No     Diabetic: Yes     Tobacco smoker: No     Systolic Blood Pressure: 120 mmHg     Is BP treated: Yes     HDL Cholesterol: 36 mg/dL     Total Cholesterol: 137 mg/dL  Objective:  Physical Exam HENT:     Head: Normocephalic.  Cardiovascular:     Rate and Rhythm: Normal rate.  Pulmonary:     Effort: Pulmonary effort is normal.     Breath sounds: Normal breath sounds.  Neurological:     Mental Status: He is alert.         Assessment And Plan:  Acute bronchitis due to other specified organisms -     Benzonatate; Take 1 capsule (100 mg total) by mouth 3 (three) times daily as needed.  Dispense: 30 capsule; Refill: 0 -     Triamcinolone Acetonide -     guaiFENesin-DM; Take 5 mLs by mouth every 4 (four) hours as needed for cough.  Dispense: 118 mL; Refill: 0  Seasonal allergic rhinitis due to pollen -     Cetirizine HCl; Take one tablet nightly for allergies.  Dispense: 90 tablet; Refill: 1  Gastroesophageal reflux disease, unspecified whether esophagitis present Assessment & Plan: Gave more sample of Voquenza 10 mg for acid reflux, because patient states they worked so well for his reflux. Will work on a PA  for him and refer to GI for chronic reflux, resistant to treatment.  Orders: -     Ambulatory referral to Gastroenterology    Return if symptoms worsen or fail to improve, for Keep next appt.  Patient was given opportunity to ask questions. Patient verbalized understanding of the plan and was able  to repeat key elements of the plan. All questions were answered to their satisfaction.   I, Ellender Hose, NP, have reviewed all documentation for this visit. The documentation on 07/01/2023 for the exam, diagnosis, procedures, and orders are all accurate and complete.     IF YOU HAVE BEEN REFERRED TO A SPECIALIST, IT MAY TAKE 1-2 WEEKS TO SCHEDULE/PROCESS THE REFERRAL. IF YOU HAVE NOT HEARD FROM US/SPECIALIST IN TWO WEEKS, PLEASE GIVE [place] A CALL AT [phone].

## 2023-06-27 ENCOUNTER — Other Ambulatory Visit: Payer: Self-pay

## 2023-06-27 MED ORDER — VOQUEZNA 10 MG PO TABS
1.0000 | ORAL_TABLET | Freq: Every day | ORAL | 5 refills | Status: DC
Start: 2023-06-27 — End: 2023-09-25

## 2023-06-28 DIAGNOSIS — I1310 Hypertensive heart and chronic kidney disease without heart failure, with stage 1 through stage 4 chronic kidney disease, or unspecified chronic kidney disease: Secondary | ICD-10-CM | POA: Insufficient documentation

## 2023-06-28 NOTE — Assessment & Plan Note (Signed)
 Encouraged to keep BP well controlled and avoid use of NSAIDs, continue losartan 100 mg every day

## 2023-07-01 DIAGNOSIS — J208 Acute bronchitis due to other specified organisms: Secondary | ICD-10-CM | POA: Insufficient documentation

## 2023-07-01 DIAGNOSIS — J301 Allergic rhinitis due to pollen: Secondary | ICD-10-CM | POA: Insufficient documentation

## 2023-07-01 NOTE — Assessment & Plan Note (Signed)
 Gave more sample of Voquenza 10 mg for acid reflux, because patient states they worked so well for his reflux. Will work on a PA for him and refer to GI for chronic reflux, resistant to treatment.

## 2023-07-05 ENCOUNTER — Other Ambulatory Visit (HOSPITAL_COMMUNITY): Payer: Self-pay

## 2023-07-18 ENCOUNTER — Ambulatory Visit: Payer: Self-pay

## 2023-07-18 NOTE — Telephone Encounter (Signed)
 Chief Complaint: dizziness Symptoms: dizziness, SOB on exertion Frequency: 1 wk Pertinent Negatives: Patient denies CP, N/V, H/A, visual changes, one-sided weakness, vomiting, bleeding Disposition: [x] ED /[] Urgent Care (no appt availability in office) / [] Appointment(In office/virtual)/ []  [place] [facility]/ [] Home Care/ [] Refused Recommended Disposition /[] [place] [facility]/ []  Follow-up with PCP Additional Notes: Pt reports episodes of SOB on exertion with dizziness for 1 wk. Pt denies having experienced this before. Pt states episodes happen while he is working outside or in his garden. Pt states he will become SOB and very dizzy. Pt states he feels "staggered" in these moments. Pt states he will then sit down for 5-10 minutes until his catches his breath and the dizziness resolves. Pt denies any SOB at rest. Denies dizziness at rest. Both symptoms occur with exertion only. Denies CP. Denies N/V, H/A, one-sided weakness, visual changes, vomiting, bleeding. He does endorse "my heart is beating real fast" when he is SOB and dizzy. Hx of CAD, afib, HTN, and COPD. RN advised pt he needs to go to the ED for symptoms given his cardiac hx. Pt agreeable to go and states his wife will take him. RN advised pt if he becomes very weak and dizzy or develops CP, vomiting, visual changes, or one-sided weakness before he arrives that he needs to call 911. Pt verbalized understanding.    Copied From CRM [id number]. Reason for Triage: Patient's wife is calling in because patient has been experiencing dizziness that will sometimes cause shortness of breath. Patient is not currently experiencing the symptoms, but says when patient tried to help her lay down mulch he says he got so dizzy he almost fell out. Originally called requesting an appointment.  Reason for Disposition  Patient sounds very sick or weak to the triager  Answer Assessment - Initial Assessment Questions 1. RESPIRATORY STATUS: "Describe your  breathing?" (e.g., wheezing, shortness of breath, unable to speak, severe coughing)      SOB on exertion 2. ONSET: "When did this breathing problem begin?"      1 wk 3. PATTERN "Does the difficult breathing come and go, or has it been constant since it started?"      Intermittent  4. SEVERITY: "How bad is your breathing?" (e.g., mild, moderate, severe)    - MILD: No SOB at rest, mild SOB with walking, speaks normally in sentences, can lie down, no retractions, pulse < 100.    - MODERATE: SOB at rest, SOB with minimal exertion and prefers to sit, cannot lie down flat, speaks in phrases, mild retractions, audible wheezing, pulse 100-120.    - SEVERE: Very SOB at rest, speaks in single words, struggling to breathe, sitting hunched forward, retractions, pulse > 120      Moderate  5. RECURRENT SYMPTOM: "Have you had difficulty breathing before?" If Yes, ask: "When was the last time?" and "What happened that time?"      Hx of COPD 6. CARDIAC HISTORY: "Do you have any history of heart disease?" (e.g., heart attack, angina, bypass surgery, angioplasty)      Yes - CAD, afib, type 2 DM 7. LUNG HISTORY: "Do you have any history of lung disease?"  (e.g., pulmonary embolus, asthma, emphysema)     Copd  8. CAUSE: "What do you think is causing the breathing problem?"      Not sure  9. OTHER SYMPTOMS: "Do you have any other symptoms? (e.g., dizziness, runny nose, cough, chest pain, fever)     Dizziness 10. O2 SATURATION MONITOR:  "  Do you use an oxygen saturation monitor (pulse oximeter) at home?" If Yes, ask: "What is your reading (oxygen level) today?" "What is your usual oxygen saturation reading?" (e.g., 95%)       97  Answer Assessment - Initial Assessment Questions 1. DESCRIPTION: "Describe your dizziness."     "Working out in the garden mulching, I got out of breath and dizzy, I sat down 5-10 minutes and then I am fine, start walking" 2. LIGHTHEADED: "Do you feel lightheaded?" (e.g., somewhat faint,  woozy, weak upon standing)     "I just get staggered a little bit" 3. VERTIGO: "Do you feel like either you or the room is spinning or tilting?" (i.e. vertigo)     No  4. SEVERITY: "How bad is it?"  "Do you feel like you are going to faint?" "Can you stand and walk?"   - MILD: Feels slightly dizzy, but walking normally.   - MODERATE: Feels unsteady when walking, but not falling; interferes with normal activities (e.g., school, work).   - SEVERE: Unable to walk without falling, or requires assistance to walk without falling; feels like passing out now.      "It happens when I start working, doing something, picking up stuff and toting it and I get out of breath", "right now I am fine", states this is not constant but is intermittent, denies that this happens without performing an activity. States he has to sit down (moderate) 5. ONSET:  "When did the dizziness begin?"     1 wk ago - 2 days ago he felt dizzy planting tomatoes and that was the last time 6. AGGRAVATING FACTORS: "Does anything make it worse?" (e.g., standing, change in head position)     Working, activities  7. HEART RATE: "Can you tell me your heart rate?" "How many beats in 15 seconds?"  (Note: not all patients can do this)       N/A 8. CAUSE: "What do you think is causing the dizziness?"     Not sure 9. RECURRENT SYMPTOM: "Have you had dizziness before?" If Yes, ask: "When was the last time?" "What happened that time?"     No  10. OTHER SYMPTOMS: "Do you have any other symptoms?" (e.g., fever, chest pain, vomiting, diarrhea, bleeding)       Denies CP. States "it feels like my heart is beating real fast when that happens and I'm out of breath but when I sit for a bit I am alright" (palpitations). Denies nausea, vomiting, bleeding, bloody stools. Denies one-sided weakness. Denies H/A and blurry vision. States his pulse ox is 97 most of the time  Protocols used: Dizziness - Lightheadedness-A-AH, Breathing Difficulty-A-AH

## 2023-07-19 ENCOUNTER — Other Ambulatory Visit (HOSPITAL_COMMUNITY): Payer: Self-pay

## 2023-08-15 ENCOUNTER — Ambulatory Visit: Payer: Medicare HMO | Admitting: Internal Medicine

## 2023-09-03 ENCOUNTER — Other Ambulatory Visit (HOSPITAL_COMMUNITY): Payer: Self-pay

## 2023-09-25 ENCOUNTER — Ambulatory Visit (INDEPENDENT_AMBULATORY_CARE_PROVIDER_SITE_OTHER): Admitting: Family Medicine

## 2023-09-25 ENCOUNTER — Encounter: Payer: Self-pay | Admitting: Family Medicine

## 2023-09-25 VITALS — BP 118/62 | HR 83 | Temp 98.5°F | Ht 69.0 in | Wt 158.0 lb

## 2023-09-25 DIAGNOSIS — I700 Atherosclerosis of aorta: Secondary | ICD-10-CM

## 2023-09-25 DIAGNOSIS — R39198 Other difficulties with micturition: Secondary | ICD-10-CM | POA: Diagnosis not present

## 2023-09-25 DIAGNOSIS — K219 Gastro-esophageal reflux disease without esophagitis: Secondary | ICD-10-CM

## 2023-09-25 DIAGNOSIS — J449 Chronic obstructive pulmonary disease, unspecified: Secondary | ICD-10-CM

## 2023-09-25 DIAGNOSIS — R300 Dysuria: Secondary | ICD-10-CM

## 2023-09-25 DIAGNOSIS — N3943 Post-void dribbling: Secondary | ICD-10-CM

## 2023-09-25 DIAGNOSIS — E1165 Type 2 diabetes mellitus with hyperglycemia: Secondary | ICD-10-CM

## 2023-09-25 DIAGNOSIS — E1169 Type 2 diabetes mellitus with other specified complication: Secondary | ICD-10-CM

## 2023-09-25 LAB — POCT URINALYSIS DIPSTICK
Bilirubin, UA: NEGATIVE
Blood, UA: NEGATIVE
Glucose, UA: POSITIVE — AB
Ketones, UA: NEGATIVE
Leukocytes, UA: NEGATIVE
Nitrite, UA: NEGATIVE
Protein, UA: NEGATIVE
Spec Grav, UA: 1.015 (ref 1.010–1.025)
Urobilinogen, UA: 0.2 U/dL
pH, UA: 6.5 (ref 5.0–8.0)

## 2023-09-25 MED ORDER — VOQUEZNA 10 MG PO TABS: 1.0000 | ORAL_TABLET | Freq: Every day | ORAL | 2 refills | Status: AC

## 2023-09-25 NOTE — Patient Instructions (Signed)
 Diabetes Mellitus and Foot Care Diabetes, also called diabetes mellitus, may cause problems with your feet and legs because of poor blood flow (circulation). Poor circulation may make your skin: Become thinner and drier. Break more easily. Heal more slowly. Peel and crack. You may also have nerve damage (neuropathy). This can cause decreased feeling in your legs and feet. This means that you may not notice minor injuries to your feet that could lead to more serious problems. Finding and treating problems early is the best way to prevent future foot problems. How to care for your feet Foot hygiene  Wash your feet daily with warm water and mild soap. Do not use hot water. Then, pat your feet and the areas between your toes until they are fully dry. Do not soak your feet. This can dry your skin. Trim your toenails straight across. Do not dig under them or around the cuticle. File the edges of your nails with an emery board or nail file. Apply a moisturizing lotion or petroleum jelly to the skin on your feet and to dry, brittle toenails. Use lotion that does not contain alcohol and is unscented. Do not apply lotion between your toes. Shoes and socks Wear clean socks or stockings every day. Make sure they are not too tight. Do not wear knee-high stockings. These may decrease blood flow to your legs. Wear shoes that fit well and have enough cushioning. Always look in your shoes before you put them on to be sure there are no objects inside. To break in new shoes, wear them for just a few hours a day. This prevents injuries on your feet. Wounds, scrapes, corns, and calluses  Check your feet daily for blisters, cuts, bruises, sores, and redness. If you cannot see the bottom of your feet, use a mirror or ask someone for help. Do not cut off corns or calluses or try to remove them with medicine. If you find a minor scrape, cut, or break in the skin on your feet, keep it and the skin around it clean and  dry. You may clean these areas with mild soap and water. Do not clean the area with peroxide, alcohol, or iodine. If you have a wound, scrape, corn, or callus on your foot, look at it several times a day to make sure it is healing and not infected. Check for: Redness, swelling, or pain. Fluid or blood. Warmth. Pus or a bad smell. General tips Do not cross your legs. This may decrease blood flow to your feet. Do not use heating pads or hot water bottles on your feet. They may burn your skin. If you have lost feeling in your feet or legs, you may not know this is happening until it is too late. Protect your feet from hot and cold by wearing shoes, such as at the beach or on hot pavement. Schedule a complete foot exam at least once a year or more often if you have foot problems. Report any cuts, sores, or bruises to your health care provider right away. Where to find more information American Diabetes Association: diabetes.org Association of Diabetes Care & Education Specialists: diabeteseducator.org Contact a health care provider if: You have a condition that increases your risk of infection, and you have any cuts, sores, or bruises on your feet. You have an injury that is not healing. You have redness on your legs or feet. You feel burning or tingling in your legs or feet. You have pain or cramps in your legs  and feet. Your legs or feet are numb. Your feet always feel cold. You have pain around any toenails. Get help right away if: You have a wound, scrape, corn, or callus on your foot and: You have signs of infection. You have a fever. You have a red line going up your leg. This information is not intended to replace advice given to you by your health care provider. Make sure you discuss any questions you have with your health care provider. Document Revised: 09/13/2021 Document Reviewed: 09/13/2021 Elsevier Patient Education  2024 Else[employer] Inc.Hypertension, Adult High blood pressure  (hypertension) is when the force of blood pumping through the arteries is too strong. The arteries are the blood vessels that carry blood from the heart throughout the body. Hypertension forces the heart to work harder to pump blood and may cause arteries to become narrow or stiff. Untreated or uncontrolled hypertension can lead to a heart attack, heart failure, a stroke, kidney disease, and other problems. A blood pressure reading consists of a higher number over a lower number. Ideally, your blood pressure should be below 120/80. The first (top) number is called the systolic pressure. It is a measure of the pressure in your arteries as your heart beats. The second (bottom) number is called the diastolic pressure. It is a measure of the pressure in your arteries as the heart relaxes. What are the causes? The exact cause of this condition is not known. There are some conditions that result in high blood pressure. What increases the risk? Certain factors may make you more likely to develop high blood pressure. Some of these risk factors are under your control, including: Smoking. Not getting enough exercise or physical activity. Being overweight. Having too much fat, sugar, calories, or salt (sodium) in your diet. Drinking too much alcohol. Other risk factors include: Having a personal history of heart disease, diabetes, high cholesterol, or kidney disease. Stress. Having a family history of high blood pressure and high cholesterol. Having obstructive sleep apnea. Age. The risk increases with age. What are the signs or symptoms? High blood pressure may not cause symptoms. Very high blood pressure (hypertensive crisis) may cause: Headache. Fast or irregular heartbeats (palpitations). Shortness of breath. Nosebleed. Nausea and vomiting. Vision changes. Severe chest pain, dizziness, and seizures. How is this diagnosed? This condition is diagnosed by measuring your blood pressure while you  are seated, with your arm resting on a flat surface, your legs uncrossed, and your feet flat on the floor. The cuff of the blood pressure monitor will be placed directly against the skin of your upper arm at the level of your heart. Blood pressure should be measured at least twice using the same arm. Certain conditions can cause a difference in blood pressure between your right and left arms. If you have a high blood pressure reading during one visit or you have normal blood pressure with other risk factors, you may be asked to: Return on a different day to have your blood pressure checked again. Monitor your blood pressure at home for 1 week or longer. If you are diagnosed with hypertension, you may have other blood or imaging tests to help your health care provider understand your overall risk for other conditions. How is this treated? This condition is treated by making healthy lifestyle changes, such as eating healthy foods, exercising more, and reducing your alcohol intake. You may be referred for counseling on a healthy diet and physical activity. Your health care provider may prescribe medicine if  lifestyle changes are not enough to get your blood pressure under control and if: Your systolic blood pressure is above 130. Your diastolic blood pressure is above 80. Your personal target blood pressure may vary depending on your medical conditions, your age, and other factors. Follow these instructions at home: Eating and drinking  Eat a diet that is high in fiber and potassium, and low in sodium, added sugar, and fat. An example of this eating plan is called the DASH diet. DASH stands for Dietary Approaches to Stop Hypertension. To eat this way: Eat plenty of fresh fruits and vegetables. Try to fill one half of your plate at each meal with fruits and vegetables. Eat whole grains, such as whole-wheat pasta, brown rice, or whole-grain bread. Fill about one fourth of your plate with whole  grains. Eat or drink low-fat dairy products, such as skim milk or low-fat yogurt. Avoid fatty cuts of meat, processed or cured meats, and poultry with skin. Fill about one fourth of your plate with lean proteins, such as fish, chicken without skin, beans, eggs, or tofu. Avoid pre-made and processed foods. These tend to be higher in sodium, added sugar, and fat. Reduce your daily sodium intake. Many people with hypertension should eat less than 1,500 mg of sodium a day. Do not drink alcohol if: Your health care provider tells you not to drink. You are pregnant, may be pregnant, or are planning to become pregnant. If you drink alcohol: Limit how much you have to: 0-1 drink a day for women. 0-2 drinks a day for men. Know how much alcohol is in your drink. In the [place] one drink equals one 12 oz bottle of beer (355 mL), one 5 oz glass of wine (148 mL), or one 1 oz glass of hard liquor (44 mL). Lifestyle  Work with your health care provider to maintain a healthy body weight or to lose weight. Ask what an ideal weight is for you. Get at least 30 minutes of exercise that causes your heart to beat faster (aerobic exercise) most days of the week. Activities may include walking, swimming, or biking. Include exercise to strengthen your muscles (resistance exercise), such as Pilates or lifting weights, as part of your weekly exercise routine. Try to do these types of exercises for 30 minutes at least 3 days a week. Do not use any products that contain nicotine  or tobacco. These products include cigarettes, chewing tobacco, and vaping devices, such as e-cigarettes. If you need help quitting, ask your health care provider. Monitor your blood pressure at home as told by your health care provider. Keep all follow-up visits. This is important. Medicines Take over-the-counter and prescription medicines only as told by your health care provider. Follow directions carefully. Blood pressure medicines must be taken  as prescribed. Do not skip doses of blood pressure medicine. Doing this puts you at risk for problems and can make the medicine less effective. Ask your health care provider about side effects or reactions to medicines that you should watch for. Contact a health care provider if you: Think you are having a reaction to a medicine you are taking. Have headaches that keep coming back (recurring). Feel dizzy. Have swelling in your ankles. Have trouble with your vision. Get help right away if you: Develop a severe headache or confusion. Have unusual weakness or numbness. Feel faint. Have severe pain in your chest or abdomen. Vomit repeatedly. Have trouble breathing. These symptoms may be an emergency. Get help right away. Call 911.  Do not wait to see if the symptoms will go away. Do not drive yourself to the hospital. Summary Hypertension is when the force of blood pumping through your arteries is too strong. If this condition is not controlled, it may put you at risk for serious complications. Your personal target blood pressure may vary depending on your medical conditions, your age, and other factors. For most people, a normal blood pressure is less than 120/80. Hypertension is treated with lifestyle changes, medicines, or a combination of both. Lifestyle changes include losing weight, eating a healthy, low-sodium diet, exercising more, and limiting alcohol. This information is not intended to replace advice given to you by your health care provider. Make sure you discuss any questions you have with your health care provider. Document Revised: 01/17/2021 Document Reviewed: 01/17/2021 Elsevier Patient Education  2024 [employer].

## 2023-09-25 NOTE — Progress Notes (Signed)
 I,Tianna Badgett,acting as a Neurosurgeon for [employer], NP.,have documented all relevant documentation on the behalf of [name], NP,as directed by  [name], NP while in the presence of [name], NP.  Subjective:  Patient ID: Jesus Bush , male    DOB: [birth year] , 78 y.o.   MRN: [id number]  Chief Complaint  Patient presents with   Diabetes    Patient presents today for HTN and DM follow up. He does have a concern with dysuria at this time.    Hypertension   Dysuria    HPI  Patient is a 78 year old male who presents to the clinic today for chronic disease management.  He has a diagnosis of type 2 diabetes, COPD, aortic atherosclerosis, chronic GERD etc. patient states he has been compliant with his medicines but today he is complaining of a weak stream, postvoid dribble and dysuria.     Past Medical History:  Diagnosis Date   COPD (chronic obstructive pulmonary disease) (HCC)    Diabetes mellitus without complication (HCC)    Hyperlipidemia    Hypertension    Vitamin D  deficiency      Family History  Problem Relation Age of Onset   Stroke Mother    Heart disease Father    Heart attack Father    Diabetes Sister    Heart disease Sister    Heart attack Brother    Hypertension Brother    Colon cancer Neg Hx    Stomach cancer Neg Hx    Esophageal cancer Neg Hx      Current Outpatient Medications:    acetaminophen  (TYLENOL ) 500 MG tablet, Take 1,000 mg by mouth daily as needed for moderate pain (pain score 4-6)., Disp: , Rfl:    benzonatate  (TESSALON  PERLES) 100 MG capsule, Take 1 capsule (100 mg total) by mouth 3 (three) times daily as needed., Disp: 30 capsule, Rfl: 0   cetirizine  (ZYRTEC  ALLERGY) 10 MG tablet, Take one tablet nightly for allergies., Disp: 90 tablet, Rfl: 1   diltiazem  (DILACOR XR ) 240 MG 24 hr capsule, Take 1 capsule (240 mg total) by mouth daily FOR BLOOD PRESSURE AND HEART RHYTHM, Disp: 90 capsule, Rfl: 3   empagliflozin  (JARDIANCE ) 10 MG TABS  tablet, Take 1 tablet (10 mg total) by mouth daily before breakfast., Disp: 30 tablet, Rfl: 6   famotidine  (PEPCID ) 40 MG tablet, Take 1 tablet (40 mg total) by mouth 2 (two) times daily to prevent heartburn & indigestion, Disp: 180 tablet, Rfl: 3   Fluticasone -Umeclidin-Vilant (TRELEGY ELLIPTA ) 200-62.5-25 MCG/ACT AEPB, Inhale 1 puff into the lungs daily for COPD /Asthma, Disp: 180 each, Rfl: 3   gabapentin  (NEURONTIN ) 300 MG capsule, Take 1 capsule (300 mg total) by mouth at 1-2 hours before bedtime as needed., Disp: 90 capsule, Rfl: 3   glipiZIDE  (GLUCOTROL ) 5 MG tablet, Take 0.5-1 tablets (2.5-5 mg total) by mouth 2 (two) times daily with a meal for diabetes., Disp: 180 tablet, Rfl: 3   guaiFENesin -dextromethorphan  (ROBITUSSIN DM) 100-10 MG/5ML syrup, Take 5 mLs by mouth every 4 (four) hours as needed for cough., Disp: 118 mL, Rfl: 0   losartan  (COZAAR ) 100 MG tablet, Take 1 tablet (100 mg total) by mouth daily for blood pressure., Disp: 90 tablet, Rfl: 3   magnesium  gluconate (MAGONATE) 500 MG tablet, Take 500 mg by mouth daily. , Disp: , Rfl:    montelukast  (SINGULAIR ) 10 MG tablet, Take 1 tablet (10 mg total) by mouth daily for allergies., Disp: 90 tablet, Rfl: 3  rosuvastatin  (CRESTOR ) 40 MG tablet, Take 1 tablet (40 mg total) by mouth daily for cholesterol., Disp: 90 tablet, Rfl: 3   VITAMIN D  PO, Take 5,000 Units by mouth daily., Disp: , Rfl:    glucose blood (TRUE METRIX BLOOD GLUCOSE TEST) test strip, Use as instructed to check blood sugars E11.65, Disp: 100 each, Rfl: 12   True Comfort Safety Lancets MISC, Use as directed to check blood sugars E11.65, Disp: 100 each, Rfl: 2   Vonoprazan Fumarate  (VOQUEZNA ) 10 MG TABS, Take 1 tablet by mouth daily., Disp: 90 tablet, Rfl: 2   Allergies  Allergen Reactions   Ppd [Tuberculin Purified Protein Derivative]     Positive PPD.     Review of Systems  Constitutional: Negative.   Respiratory: Negative.    Cardiovascular: Negative.    Gastrointestinal: Negative.  Negative for abdominal pain.  Genitourinary:  Positive for dysuria. Negative for decreased urine volume, hematuria and penile discharge.  Musculoskeletal: Negative.   Skin: Negative.   Psychiatric/Behavioral: Negative.       Today's Vitals   09/25/23 1407  BP: 118/62  Pulse: 83  Temp: 98.5 F (36.9 C)  TempSrc: Oral  SpO2: 95%  Weight: 158 lb (71.7 kg)  Height: 5' 9 (1.753 m)   Body mass index is 23.33 kg/m.  Wt Readings from Last 3 Encounters:  09/25/23 158 lb (71.7 kg)  06/26/23 167 lb (75.8 kg)  06/12/23 170 lb (77.1 kg)    The 10-year ASCVD risk score (Arnett DK, et al., 2019) is: 49.4%   Values used to calculate the score:     Age: 24 years     Clincally relevant sex: Male     Is Non-Hispanic African American: No     Diabetic: Yes     Tobacco smoker: No     Systolic Blood Pressure: 118 mmHg     Is BP treated: Yes     HDL Cholesterol: 36 mg/dL     Total Cholesterol: 137 mg/dL  Objective:  Physical Exam HENT:     Head: Normocephalic.  Cardiovascular:     Rate and Rhythm: Normal rate and regular rhythm.  Pulmonary:     Effort: Pulmonary effort is normal.     Breath sounds: Normal breath sounds.  Neurological:     Mental Status: He is alert.         Assessment And Plan:  Dysuria -     POCT urinalysis dipstick  Decreased urine stream -     Ambulatory referral to Urology  Chronic obstructive pulmonary disease, unspecified COPD type (HCC) Assessment & Plan: Chronic. Current  Trelegy 1 puff daily   Aortic atherosclerosis [facility]) Assessment & Plan: Per CT 01/2019.On Crestor  40 mg every day.   Gastroesophageal reflux disease without esophagitis -     Voquezna ; Take 1 tablet by mouth daily.  Dispense: 90 tablet; Refill: 2  Type 2 diabetes mellitus with hyperglycemia, without long-term current use of insulin  (HCC) -     Hemoglobin A1c -     BMP8+EGFR -     CBC with Differential/Platelet    Return in about 3 months  (around 12/26/2023) for diabetes.  Patient was given opportunity to ask questions. Patient verbalized understanding of the plan and was able to repeat key elements of the plan. All questions were answered to their satisfaction.   I, [name], NP, have reviewed all documentation for this visit. The documentation on 10/02/2023 for the exam, diagnosis, procedures, and orders are all accurate and complete.  IF YOU HAVE BEEN REFERRED TO A SPECIALIST, IT MAY TAKE 1-2 WEEKS TO SCHEDULE/PROCESS THE REFERRAL. IF YOU HAVE NOT HEARD FROM US /SPECIALIST IN TWO WEEKS, PLEASE GIVE US  A CALL AT [phone] X [phone].

## 2023-09-26 LAB — CBC WITH DIFFERENTIAL/PLATELET
Basophils Absolute: 0.1 10*3/uL (ref 0.0–0.2)
Basos: 1 %
EOS (ABSOLUTE): 0.1 10*3/uL (ref 0.0–0.4)
Eos: 1 %
Hematocrit: 41.3 % (ref 37.5–51.0)
Hemoglobin: 13.2 g/dL (ref 13.0–17.7)
Immature Grans (Abs): 0 10*3/uL (ref 0.0–0.1)
Immature Granulocytes: 0 %
Lymphocytes Absolute: 1.6 10*3/uL (ref 0.7–3.1)
Lymphs: 17 %
MCH: 29.5 pg (ref 26.6–33.0)
MCHC: 32 g/dL (ref 31.5–35.7)
MCV: 92 fL (ref 79–97)
Monocytes Absolute: 0.6 10*3/uL (ref 0.1–0.9)
Monocytes: 6 %
Neutrophils Absolute: 6.7 10*3/uL (ref 1.4–7.0)
Neutrophils: 75 %
Platelets: 353 10*3/uL (ref 150–450)
RBC: 4.47 x10E6/uL (ref 4.14–5.80)
RDW: 15.7 % — ABNORMAL HIGH (ref 11.6–15.4)
WBC: 9.1 10*3/uL (ref 3.4–10.8)

## 2023-09-26 LAB — BMP8+EGFR
BUN/Creatinine Ratio: 12 (ref 10–24)
BUN: 10 mg/dL (ref 8–27)
CO2: 21 mmol/L (ref 20–29)
Calcium: 10.2 mg/dL (ref 8.6–10.2)
Chloride: 100 mmol/L (ref 96–106)
Creatinine, Ser: 0.83 mg/dL (ref 0.76–1.27)
Glucose: 204 mg/dL — ABNORMAL HIGH (ref 70–99)
Potassium: 3.9 mmol/L (ref 3.5–5.2)
Sodium: 137 mmol/L (ref 134–144)
eGFR: 90 mL/min/{1.73_m2} (ref >59)

## 2023-09-26 LAB — HEMOGLOBIN A1C
Est. average glucose Bld gHb Est-mCnc: 154 mg/dL
Hgb A1c MFr Bld: 7 % — ABNORMAL HIGH (ref 4.8–5.6)

## 2023-10-02 ENCOUNTER — Other Ambulatory Visit: Payer: Self-pay

## 2023-10-02 ENCOUNTER — Ambulatory Visit: Payer: Self-pay | Admitting: Family Medicine

## 2023-10-02 MED ORDER — TRUE COMFORT SAFETY LANCETS MISC
2 refills | Status: AC
  Filled 2023-10-02: qty 100, 100d supply, fill #0

## 2023-10-02 MED ORDER — TRUE METRIX BLOOD GLUCOSE TEST VI STRP
ORAL_STRIP | 12 refills | Status: AC
  Filled 2023-10-02: qty 100, 100d supply, fill #0

## 2023-10-02 NOTE — Progress Notes (Signed)
 A1c 7,0, it has increased slightly . Goal is 7 or less. Please low carb foods and take all meds as prescribed  Thanks

## 2023-10-02 NOTE — Assessment & Plan Note (Signed)
 Per CT 01/2019.On Crestor 40 mg every day.

## 2023-10-02 NOTE — Assessment & Plan Note (Signed)
 Chronic. Current  Trelegy 1 puff daily

## 2023-10-03 ENCOUNTER — Encounter (HOSPITAL_COMMUNITY): Payer: Self-pay

## 2023-10-03 ENCOUNTER — Other Ambulatory Visit: Payer: Self-pay

## 2023-10-03 ENCOUNTER — Other Ambulatory Visit (HOSPITAL_COMMUNITY): Payer: Self-pay

## 2023-10-04 ENCOUNTER — Other Ambulatory Visit: Payer: Self-pay | Admitting: Family Medicine

## 2023-10-04 ENCOUNTER — Other Ambulatory Visit (HOSPITAL_COMMUNITY): Payer: Self-pay

## 2023-10-07 ENCOUNTER — Other Ambulatory Visit (HOSPITAL_COMMUNITY): Payer: Self-pay

## 2023-10-09 ENCOUNTER — Other Ambulatory Visit: Payer: Self-pay

## 2023-10-09 ENCOUNTER — Other Ambulatory Visit (HOSPITAL_COMMUNITY): Payer: Self-pay

## 2023-10-09 DIAGNOSIS — E1165 Type 2 diabetes mellitus with hyperglycemia: Secondary | ICD-10-CM

## 2023-10-09 MED ORDER — ONETOUCH ULTRASOFT LANCETS MISC
12 refills | Status: AC
  Filled 2023-10-09: qty 100, 33d supply, fill #0

## 2023-10-09 MED ORDER — ONETOUCH ULTRA VI STRP
ORAL_STRIP | 12 refills | Status: AC
  Filled 2023-10-09: qty 100, 33d supply, fill #0

## 2023-10-09 MED ORDER — ONETOUCH ULTRA 2 W/DEVICE KIT
PACK | 0 refills | Status: AC
  Filled 2023-10-09: qty 1, 30d supply, fill #0

## 2023-10-14 ENCOUNTER — Other Ambulatory Visit (HOSPITAL_COMMUNITY): Payer: Self-pay

## 2023-10-21 ENCOUNTER — Encounter: Payer: Self-pay | Admitting: Urology

## 2023-10-21 ENCOUNTER — Ambulatory Visit: Admitting: Urology

## 2023-10-21 VITALS — BP 113/66 | HR 79 | Ht 69.0 in | Wt 165.0 lb

## 2023-10-21 DIAGNOSIS — N138 Other obstructive and reflux uropathy: Secondary | ICD-10-CM

## 2023-10-21 DIAGNOSIS — R351 Nocturia: Secondary | ICD-10-CM

## 2023-10-21 DIAGNOSIS — N401 Enlarged prostate with lower urinary tract symptoms: Secondary | ICD-10-CM

## 2023-10-21 DIAGNOSIS — N481 Balanitis: Secondary | ICD-10-CM

## 2023-10-21 DIAGNOSIS — R3912 Poor urinary stream: Secondary | ICD-10-CM

## 2023-10-21 DIAGNOSIS — R39198 Other difficulties with micturition: Secondary | ICD-10-CM

## 2023-10-21 LAB — URINALYSIS, ROUTINE W REFLEX MICROSCOPIC
Bilirubin, UA: NEGATIVE
Ketones, UA: NEGATIVE
Leukocytes,UA: NEGATIVE
Nitrite, UA: NEGATIVE
Protein,UA: NEGATIVE
RBC, UA: NEGATIVE
Specific Gravity, UA: 1.01 (ref 1.005–1.030)
Urobilinogen, Ur: 0.2 mg/dL (ref 0.2–1.0)
pH, UA: 7 (ref 5.0–7.5)

## 2023-10-21 LAB — BLADDER SCAN AMB NON-IMAGING: Scan Result: 54

## 2023-10-21 LAB — MICROSCOPIC EXAMINATION: Bacteria, UA: NONE SEEN

## 2023-10-21 MED ORDER — ALFUZOSIN HCL ER 10 MG PO TB24: 10.0000 mg | ORAL_TABLET | Freq: Every day | ORAL | 11 refills | Status: AC

## 2023-10-21 NOTE — Progress Notes (Signed)
 Chief Complaint: Urinary difficulty  History of Present Illness:  Jesus Bush is a 78 y.o. male who is seen in consultation from [name], NP for evaluation of lower urinary tract symptoms, especially nocturia.  He has had worsening urinary symptoms over the past few months/years.  Currently, IPSS 32/6.  He is not on any medical therapy for BPH.  That he knows of, there is no family history of prostate problems or prostate cancer.  PSA in October 2024 was 1.60.   Past Medical History:  Past Medical History:  Diagnosis Date   COPD (chronic obstructive pulmonary disease) (HCC)    Diabetes mellitus without complication (HCC)    Hyperlipidemia    Hypertension    Vitamin D  deficiency     Past Surgical History:  Past Surgical History:  Procedure Laterality Date   CATARACT EXTRACTION, BILATERAL Bilateral 2018   Dr. Rosealee   INTERCOSTAL NERVE BLOCK  08/27/2022   Procedure: INTERCOSTAL NERVE BLOCK;  Surgeon: Shyrl Linnie KIDD, MD;  Location: [facility] OR;  Service: Thoracic;;    Allergies:  Allergies  Allergen Reactions   Ppd [Tuberculin Purified Protein Derivative]     Positive PPD.    Family History:  Family History  Problem Relation Age of Onset   Stroke Mother    Heart disease Father    Heart attack Father    Diabetes Sister    Heart disease Sister    Heart attack Brother    Hypertension Brother    Colon cancer Neg Hx    Stomach cancer Neg Hx    Esophageal cancer Neg Hx     Social History:  Social History   Tobacco Use   Smoking status: Former    Current packs/day: 0.00    Average packs/day: 1 pack/day for 44.0 years (44.0 ttl pk-yrs)    Types: Cigarettes    Start date: 23    Quit date: 05/30/2022    Years since quitting: 1.3    Passive exposure: Never   Smokeless tobacco: Never  Vaping Use   Vaping status: Never Used  Substance Use Topics   Alcohol use: No    Alcohol/week: 0.0 standard drinks of alcohol    Comment: He states he no longer  drinks alcohol   Drug use: No    Review of symptoms:  Constitutional:  Negative for unexplained weight loss, night sweats, fever, chills ENT:  Negative for nose bleeds, sinus pain, painful swallowing CV:  Negative for chest pain, shortness of breath, exercise intolerance, palpitations, loss of consciousness Resp:  Negative for cough, wheezing, shortness of breath GI:  Negative for nausea, vomiting, diarrhea, bloody stools GU:  Positives noted in HPI; otherwise negative for gross hematuria, dysuria, urinary incontinence Neuro:  Negative for seizures, poor balance, limb weakness, slurred speech Psych:  Negative for lack of energy, depression, anxiety Endocrine:  Negative for polydipsia, polyuria, symptoms of hypoglycemia (dizziness, hunger, sweating) Hematologic:  Negative for anemia, purpura, petechia, prolonged or excessive bleeding, use of anticoagulants  Allergic:  Negative for difficulty breathing or choking as a result of exposure to anything; no shellfish allergy; no allergic response (rash/itch) to materials, foods  Physical exam: There were no vitals taken for this visit. GENERAL APPEARANCE:  Well appearing, well developed, well nourished, NAD HEENT: Atraumatic, Normocephalic. NECK: Normal appearance LUNGS: Normal inspiratory and expiratory excursion HEART: Regular Rate ABDOMEN: Protuberant, no inguinal hernias GU: Phallus on circumcised.  No phimosis.  Mild balanitis scrotal skin normal. Testicles/epididymal structures normal. Meatus normal. Normal anal  sphincter tone, prostate 60 mL, symmetric, non nodular, non tender. EXTREMITIES: Moves all extremities well.  Without clubbing, cyanosis, or edema. NEUROLOGIC:  Alert and oriented x 3, normal gait, CN II-XII grossly intact.  MENTAL STATUS:  Appropriate. SKIN:  Warm, dry and intact.    Results:   I have reviewed referring/prior physicians notes  I have reviewed urinalysis--clear  I have reviewed PSA results--most recently  1.6  I have reviewed prior imaging--CT images from May 2024.  Estimated prostate volume 55 mL  I have reviewed chemistry/CBC results--glucose 204 Assessment: 1.  BPH with lower urinary tract symptoms.  He does empty well  2.  Balanitis, mild   Plan: 1.  I reassured him about his exam, as well as his normal PSA  2.  He would like to consider medical therapy.  I will start him on alfuzosin   3.  Proper hygiene for his foreskin discussed  4.  I will see back in about 3 months to recheck symptoms

## 2023-10-24 ENCOUNTER — Other Ambulatory Visit: Payer: Self-pay

## 2023-10-24 ENCOUNTER — Inpatient Hospital Stay (HOSPITAL_BASED_OUTPATIENT_CLINIC_OR_DEPARTMENT_OTHER)
Admission: EM | Admit: 2023-10-24 | Discharge: 2023-10-27 | DRG: 312 | Disposition: A | Discharge status: Home / Self Care | Source: Ambulatory Visit | Attending: Internal Medicine | Admitting: Internal Medicine

## 2023-10-24 ENCOUNTER — Encounter (HOSPITAL_BASED_OUTPATIENT_CLINIC_OR_DEPARTMENT_OTHER): Payer: Self-pay

## 2023-10-24 ENCOUNTER — Emergency Department (HOSPITAL_BASED_OUTPATIENT_CLINIC_OR_DEPARTMENT_OTHER)

## 2023-10-24 DIAGNOSIS — T465X5A Adverse effect of other antihypertensive drugs, initial encounter: Secondary | ICD-10-CM | POA: Diagnosis present

## 2023-10-24 DIAGNOSIS — Z1152 Encounter for screening for COVID-19: Secondary | ICD-10-CM

## 2023-10-24 DIAGNOSIS — Z833 Family history of diabetes mellitus: Secondary | ICD-10-CM

## 2023-10-24 DIAGNOSIS — Z7984 Long term (current) use of oral hypoglycemic drugs: Secondary | ICD-10-CM

## 2023-10-24 DIAGNOSIS — J440 Chronic obstructive pulmonary disease with acute lower respiratory infection: Secondary | ICD-10-CM | POA: Diagnosis present

## 2023-10-24 DIAGNOSIS — D649 Anemia, unspecified: Secondary | ICD-10-CM | POA: Diagnosis present

## 2023-10-24 DIAGNOSIS — I959 Hypotension, unspecified: Principal | ICD-10-CM | POA: Diagnosis present

## 2023-10-24 DIAGNOSIS — I952 Hypotension due to drugs: Secondary | ICD-10-CM | POA: Diagnosis not present

## 2023-10-24 DIAGNOSIS — Z8249 Family history of ischemic heart disease and other diseases of the circulatory system: Secondary | ICD-10-CM

## 2023-10-24 DIAGNOSIS — I10 Essential (primary) hypertension: Secondary | ICD-10-CM | POA: Diagnosis present

## 2023-10-24 DIAGNOSIS — T50905A Adverse effect of unspecified drugs, medicaments and biological substances, initial encounter: Secondary | ICD-10-CM | POA: Insufficient documentation

## 2023-10-24 DIAGNOSIS — N179 Acute kidney failure, unspecified: Secondary | ICD-10-CM | POA: Diagnosis present

## 2023-10-24 DIAGNOSIS — Z87891 Personal history of nicotine dependence: Secondary | ICD-10-CM

## 2023-10-24 DIAGNOSIS — E860 Dehydration: Secondary | ICD-10-CM | POA: Diagnosis present

## 2023-10-24 DIAGNOSIS — E119 Type 2 diabetes mellitus without complications: Secondary | ICD-10-CM | POA: Diagnosis present

## 2023-10-24 DIAGNOSIS — E538 Deficiency of other specified B group vitamins: Secondary | ICD-10-CM | POA: Diagnosis present

## 2023-10-24 DIAGNOSIS — E785 Hyperlipidemia, unspecified: Secondary | ICD-10-CM | POA: Diagnosis present

## 2023-10-24 DIAGNOSIS — Z9842 Cataract extraction status, left eye: Secondary | ICD-10-CM

## 2023-10-24 DIAGNOSIS — I480 Paroxysmal atrial fibrillation: Secondary | ICD-10-CM | POA: Diagnosis present

## 2023-10-24 DIAGNOSIS — Z823 Family history of stroke: Secondary | ICD-10-CM

## 2023-10-24 DIAGNOSIS — E1122 Type 2 diabetes mellitus with diabetic chronic kidney disease: Secondary | ICD-10-CM

## 2023-10-24 DIAGNOSIS — J189 Pneumonia, unspecified organism: Secondary | ICD-10-CM | POA: Diagnosis present

## 2023-10-24 DIAGNOSIS — Z9841 Cataract extraction status, right eye: Secondary | ICD-10-CM

## 2023-10-24 DIAGNOSIS — N400 Enlarged prostate without lower urinary tract symptoms: Secondary | ICD-10-CM | POA: Diagnosis present

## 2023-10-24 DIAGNOSIS — I472 Ventricular tachycardia, unspecified: Secondary | ICD-10-CM | POA: Diagnosis present

## 2023-10-24 DIAGNOSIS — Z79899 Other long term (current) drug therapy: Secondary | ICD-10-CM

## 2023-10-24 DIAGNOSIS — I4729 Other ventricular tachycardia: Secondary | ICD-10-CM | POA: Insufficient documentation

## 2023-10-24 DIAGNOSIS — E861 Hypovolemia: Secondary | ICD-10-CM | POA: Diagnosis present

## 2023-10-24 LAB — I-STAT VENOUS BLOOD GAS, ED
Acid-base deficit: 3 mmol/L — ABNORMAL HIGH (ref 0.0–2.0)
Bicarbonate: 21.3 mmol/L (ref 20.0–28.0)
Calcium, Ion: 1.17 mmol/L (ref 1.15–1.40)
HCT: 35 % — ABNORMAL LOW (ref 39.0–52.0)
Hemoglobin: 11.9 g/dL — ABNORMAL LOW (ref 13.0–17.0)
O2 Saturation: 80 %
Patient temperature: 98.6
Potassium: 3.6 mmol/L (ref 3.5–5.1)
Sodium: 135 mmol/L (ref 135–145)
TCO2: 22 mmol/L (ref 22–32)
pCO2, Ven: 34.4 mmHg — ABNORMAL LOW (ref 44–60)
pH, Ven: 7.4 (ref 7.25–7.43)
pO2, Ven: 43 mmHg (ref 32–45)

## 2023-10-24 LAB — CBC WITH DIFFERENTIAL/PLATELET
Abs Immature Granulocytes: 0.05 K/uL (ref 0.00–0.07)
Basophils Absolute: 0.1 K/uL (ref 0.0–0.1)
Basophils Relative: 1 %
Eosinophils Absolute: 0 K/uL (ref 0.0–0.5)
Eosinophils Relative: 0 %
HCT: 35.2 % — ABNORMAL LOW (ref 39.0–52.0)
Hemoglobin: 11.2 g/dL — ABNORMAL LOW (ref 13.0–17.0)
Immature Granulocytes: 1 %
Lymphocytes Relative: 10 %
Lymphs Abs: 1 K/uL (ref 0.7–4.0)
MCH: 28.2 pg (ref 26.0–34.0)
MCHC: 31.8 g/dL (ref 30.0–36.0)
MCV: 88.7 fL (ref 80.0–100.0)
Monocytes Absolute: 0.9 K/uL (ref 0.1–1.0)
Monocytes Relative: 8 %
Neutro Abs: 8.6 K/uL — ABNORMAL HIGH (ref 1.7–7.7)
Neutrophils Relative %: 80 %
Platelets: 317 K/uL (ref 150–400)
RBC: 3.97 MIL/uL — ABNORMAL LOW (ref 4.22–5.81)
RDW: 14.8 % (ref 11.5–15.5)
WBC: 10.6 K/uL — ABNORMAL HIGH (ref 4.0–10.5)
nRBC: 0 % (ref 0.0–0.2)

## 2023-10-24 LAB — URINALYSIS, ROUTINE W REFLEX MICROSCOPIC
Bacteria, UA: NONE SEEN
Bilirubin Urine: NEGATIVE
Glucose, UA: >1000 mg/dL — AB
Hgb urine dipstick: NEGATIVE
Ketones, ur: NEGATIVE mg/dL
Nitrite: NEGATIVE
Protein, ur: NEGATIVE mg/dL
Specific Gravity, Urine: 1.012 (ref 1.005–1.030)
pH: 5.5 (ref 5.0–8.0)

## 2023-10-24 LAB — COMPREHENSIVE METABOLIC PANEL WITH GFR
ALT: 12 U/L (ref 0–44)
AST: 15 U/L (ref 15–41)
Albumin: 4.2 g/dL (ref 3.5–5.0)
Alkaline Phosphatase: 87 U/L (ref 38–126)
Anion gap: 11 (ref 5–15)
BUN: 18 mg/dL (ref 8–23)
CO2: 24 mmol/L (ref 22–32)
Calcium: 9.8 mg/dL (ref 8.9–10.3)
Chloride: 99 mmol/L (ref 98–111)
Creatinine, Ser: 1.59 mg/dL — ABNORMAL HIGH (ref 0.61–1.24)
GFR, Estimated: 44 mL/min — ABNORMAL LOW (ref >60)
Glucose, Bld: 187 mg/dL — ABNORMAL HIGH (ref 70–99)
Potassium: 3.7 mmol/L (ref 3.5–5.1)
Sodium: 134 mmol/L — ABNORMAL LOW (ref 135–145)
Total Bilirubin: 0.3 mg/dL (ref 0.0–1.2)
Total Protein: 7.1 g/dL (ref 6.5–8.1)

## 2023-10-24 LAB — RESP PANEL BY RT-PCR (RSV, FLU A&B, COVID)  RVPGX2
Influenza A by PCR: NEGATIVE
Influenza B by PCR: NEGATIVE
Resp Syncytial Virus by PCR: NEGATIVE
SARS Coronavirus 2 by RT PCR: NEGATIVE

## 2023-10-24 LAB — PRO BRAIN NATRIURETIC PEPTIDE: Pro Brain Natriuretic Peptide: 73.2 pg/mL (ref <300.0)

## 2023-10-24 LAB — LACTIC ACID, PLASMA
Lactic Acid, Venous: 1.3 mmol/L (ref 0.5–1.9)
Lactic Acid, Venous: 1 mmol/L (ref 0.5–1.9)

## 2023-10-24 MED ORDER — LACTATED RINGERS IV BOLUS: 1000.0000 mL | Freq: Once | INTRAVENOUS | Status: DC

## 2023-10-24 MED ORDER — LACTATED RINGERS IV BOLUS
1000.0000 mL | Freq: Once | INTRAVENOUS | Status: AC
  Administered 2023-10-24: 1000 mL via INTRAVENOUS

## 2023-10-24 MED ORDER — SODIUM CHLORIDE 0.9 % IV SOLN
500.0000 mg | Freq: Once | INTRAVENOUS | Status: AC
  Administered 2023-10-24: 500 mg via INTRAVENOUS
  Filled 2023-10-24: qty 5

## 2023-10-24 MED ORDER — SODIUM CHLORIDE 0.9 % IV SOLN
1.0000 g | Freq: Once | INTRAVENOUS | Status: AC
  Administered 2023-10-24: 1 g via INTRAVENOUS
  Filled 2023-10-24: qty 10

## 2023-10-24 NOTE — ED Triage Notes (Signed)
 Pt c/o blood pressure fluctuating, pulse ain't right. C/o dizziness, advises he felt bad yesterday too. Wife advises that he'd been mowing this morning, came into the house & his O2 88%, HR 116. BP was 89/44.

## 2023-10-24 NOTE — ED Provider Notes (Signed)
 [facility] AT [facility] Provider Note   CSN: [id number] Arrival date & time: 10/24/23  1540     Patient presents with: No chief complaint on file.   Jesus Bush is a 78 y.o. male.  {Add pertinent medical, surgical, social history, OB history to HPI:32947} HPI     77 year old male with a history of COPD, diabetes, hypertension, hyperlipidemia, BPH, recent diagnosis of balanitis by urology, who presents with concern for hypotension.  He was started on alfuzosin  by urology 7/28 Took one yesterday, one today Blood pressure has always been normal   Weakness, lightheadedness  No chest pain or dyspnea Felt dizzy while mowing the grass otday, came inside, HR 116, home oxygen was 88 No nausea, no vomiting, no diarrhea or black or bloody stools Eating and drinking ok No pain with urination Had balanitis but is better No other changes in medication No fever, no cough per patient, wife reports last 2-3 months having worsening cough  Having chills yesterday for 3-4 hours No abdominal pain or headache  Yesterday head and neck pain but improved   Past Medical History:  Diagnosis Date   COPD (chronic obstructive pulmonary disease) (HCC)    Diabetes mellitus without complication (HCC)    Hyperlipidemia    Hypertension    Vitamin D  deficiency      Prior to Admission medications   Medication Sig Start Date End Date Taking? Authorizing Provider  acetaminophen  (TYLENOL ) 500 MG tablet Take 1,000 mg by mouth daily as needed for moderate pain (pain score 4-6).    [provider]  alfuzosin  (UROXATRAL ) 10 MG 24 hr tablet Take 1 tablet (10 mg total) by mouth daily with breakfast. 10/21/23   [name], MD  benzonatate  (TESSALON  PERLES) 100 MG capsule Take 1 capsule (100 mg total) by mouth 3 (three) times daily as needed. 06/26/23 06/25/24  [name], NP  Blood Glucose Monitoring Suppl (ONE TOUCH ULTRA 2) w/Device KIT Use as directed to  check blood sugars three times daily E11.69 10/09/23   [name], NP  cetirizine  (ZYRTEC  ALLERGY) 10 MG tablet Take one tablet nightly for allergies. 06/26/23   [name], NP  diltiazem  (DILACOR XR ) 240 MG 24 hr capsule Take 1 capsule (240 mg total) by mouth daily FOR BLOOD PRESSURE AND HEART RHYTHM 04/10/23   Wilkinson, Dana E, NP  empagliflozin  (JARDIANCE ) 10 MG TABS tablet Take 1 tablet (10 mg total) by mouth daily before breakfast. 06/12/23   [name], NP  famotidine  (PEPCID ) 40 MG tablet Take 1 tablet (40 mg total) by mouth 2 (two) times daily to prevent heartburn & indigestion 06/18/23   [name], NP  Fluticasone -Umeclidin-Vilant (TRELEGY ELLIPTA ) 200-62.5-25 MCG/ACT AEPB Inhale 1 puff into the lungs daily for COPD /Asthma 04/10/23   Wilkinson, Dana E, NP  gabapentin  (NEURONTIN ) 300 MG capsule Take 1 capsule (300 mg total) by mouth at 1-2 hours before bedtime as needed. 04/10/23   Wilkinson, Dana E, NP  glucose blood (ONETOUCH ULTRA) test strip Use as instructed to check blood sugars 3 times daily E11.69 10/09/23   [name], NP  glucose blood (TRUE METRIX BLOOD GLUCOSE TEST) test strip Use as instructed to check blood sugars 10/02/23   [name], NP  guaiFENesin -dextromethorphan  (ROBITUSSIN DM) 100-10 MG/5ML syrup Take 5 mLs by mouth every 4 (four) hours as needed for cough. 06/26/23   [name], NP  Lancets [facility] ULTRASOFT) lancets Use as instructed to check blood sugars three times daily E11.69 10/09/23  [name], NP  losartan  (COZAAR ) 100 MG tablet Take 1 tablet (100 mg total) by mouth daily for blood pressure. 04/10/23   Wilkinson, Dana E, NP  magnesium  gluconate (MAGONATE) 500 MG tablet Take 500 mg by mouth daily.     [provider]  montelukast  (SINGULAIR ) 10 MG tablet Take 1 tablet (10 mg total) by mouth daily for allergies. 04/10/23   Wilkinson, Dana E, NP  rosuvastatin  (CRESTOR ) 40 MG tablet Take 1 tablet (40 mg total) by mouth daily for cholesterol. 04/10/23    Wilkinson, Dana E, NP  True Comfort Safety Lancets MISC Use as directed to check blood sugars E11.65 10/02/23   [name], NP  VITAMIN D  PO Take 5,000 Units by mouth daily.    [provider]  Vonoprazan Fumarate  (VOQUEZNA ) 10 MG TABS Take 1 tablet by mouth daily. 09/25/23   [name], NP  glipiZIDE  (GLUCOTROL ) 5 MG tablet Take 0.5-1 tablets (2.5-5 mg total) by mouth 2 (two) times daily with a meal for diabetes. 04/10/23 10/14/23  Wilkinson, Dana E, NP    Allergies: Ppd [tuberculin purified protein derivative]    Review of Systems  Updated Vital Signs BP (!) 94/43 (BP Location: Right Arm)   Pulse 92   Temp 98.1 F (36.7 C)   Resp 17   SpO2 97%   Physical Exam  (all labs ordered are listed, but only abnormal results are displayed) Labs Reviewed - No data to display  EKG: None  Radiology: No results found.  {Document cardiac monitor, telemetry assessment procedure when appropriate:32947} Procedures   Medications Ordered in the ED - No data to display    {Click here for ABCD2, HEART and other calculators REFRESH Note before signing:1}                               78 year old male with a history of COPD, diabetes, hypertension, hyperlipidemia, BPH, recent diagnosis of balanitis by urology, who presents with concern for hypotension.  Differential diagnosis includes medication changes, dehydration, ACS, PE, anemia, electrolyte abnormalities, sepsis.  EKG completed and personally eval and interpreted by me shows normal sinus rhythm without acute arrhythmia.  Labs completed and personally evaluated interpreted by me show very mild leukocytosis 10.6, similar mild anemia, CMP with an acute kidney injury with a creatinine of 1.59 from previous of 0.7.  No clinically significant electrolyte abnormalities.  Lactic acid within normal limits.  Urinalysis without signs of infection.  Clinically, feel his symptoms are likely secondary to initiation of new alpha-blocker for  BPH, and dehydration in the setting of mowing the lawn in the heat.  He appeared to be dry on exam and was given IV fluids.  Chest x-ray returned and does show mild diffuse interstitial opacities which on my review are similar to November.  In November, he had been admitted with concern for sepsis secondary to pneumonia.  He clinically does not appear to be volume overloaded, however have added on a proBNP for monitoring in the setting of chest x-ray findings.  Because he had similar concern for pneumonia with similar appearing x-ray in November, hypotension, will cover with Rocephin  and azithromycin  for possible sepsis secondary to pneumonia, although my overall clinical suspicion continues to be that this is medication induced hypotension from initiation of the alpha-blocker.  He is maintaining MAPs above 65, mentating normally, normal lactic acid and feel he is appropriate for stepdown admission.   Have ordered for them to  check a postvoid residual with obstruction on differential for AKI although suspect it is secondary to hypotension, dehydration.   {Document critical care time when appropriate  Document review of labs and clinical decision tools ie CHADS2VASC2, etc  Document your independent review of radiology images and any outside records  Document your discussion with family members, caretakers and with consultants  Document social determinants of health affecting pt's care  Document your decision making why or why not admission, treatments were needed:32947:::1}   Final diagnoses:  None    ED Discharge Orders     None

## 2023-10-24 NOTE — ED Notes (Signed)
 Pt signed consent to transfer. Awaitng [facility] for transport.

## 2023-10-25 DIAGNOSIS — Z79899 Other long term (current) drug therapy: Secondary | ICD-10-CM | POA: Diagnosis not present

## 2023-10-25 DIAGNOSIS — Z1152 Encounter for screening for COVID-19: Secondary | ICD-10-CM | POA: Diagnosis not present

## 2023-10-25 DIAGNOSIS — Z7984 Long term (current) use of oral hypoglycemic drugs: Secondary | ICD-10-CM | POA: Diagnosis not present

## 2023-10-25 DIAGNOSIS — N179 Acute kidney failure, unspecified: Secondary | ICD-10-CM | POA: Diagnosis present

## 2023-10-25 DIAGNOSIS — E538 Deficiency of other specified B group vitamins: Secondary | ICD-10-CM | POA: Diagnosis present

## 2023-10-25 DIAGNOSIS — J189 Pneumonia, unspecified organism: Secondary | ICD-10-CM

## 2023-10-25 DIAGNOSIS — D649 Anemia, unspecified: Secondary | ICD-10-CM | POA: Diagnosis present

## 2023-10-25 DIAGNOSIS — E861 Hypovolemia: Secondary | ICD-10-CM | POA: Diagnosis present

## 2023-10-25 DIAGNOSIS — J449 Chronic obstructive pulmonary disease, unspecified: Secondary | ICD-10-CM

## 2023-10-25 DIAGNOSIS — N400 Enlarged prostate without lower urinary tract symptoms: Secondary | ICD-10-CM | POA: Diagnosis present

## 2023-10-25 DIAGNOSIS — I472 Ventricular tachycardia, unspecified: Secondary | ICD-10-CM | POA: Diagnosis present

## 2023-10-25 DIAGNOSIS — T50905A Adverse effect of unspecified drugs, medicaments and biological substances, initial encounter: Secondary | ICD-10-CM | POA: Diagnosis not present

## 2023-10-25 DIAGNOSIS — T465X5A Adverse effect of other antihypertensive drugs, initial encounter: Secondary | ICD-10-CM | POA: Diagnosis present

## 2023-10-25 DIAGNOSIS — E785 Hyperlipidemia, unspecified: Secondary | ICD-10-CM | POA: Diagnosis present

## 2023-10-25 DIAGNOSIS — I9589 Other hypotension: Secondary | ICD-10-CM | POA: Diagnosis not present

## 2023-10-25 DIAGNOSIS — I10 Essential (primary) hypertension: Secondary | ICD-10-CM | POA: Diagnosis present

## 2023-10-25 DIAGNOSIS — I4729 Other ventricular tachycardia: Secondary | ICD-10-CM | POA: Diagnosis not present

## 2023-10-25 DIAGNOSIS — I952 Hypotension due to drugs: Secondary | ICD-10-CM | POA: Diagnosis present

## 2023-10-25 DIAGNOSIS — E860 Dehydration: Secondary | ICD-10-CM | POA: Diagnosis present

## 2023-10-25 DIAGNOSIS — I480 Paroxysmal atrial fibrillation: Secondary | ICD-10-CM

## 2023-10-25 DIAGNOSIS — E119 Type 2 diabetes mellitus without complications: Secondary | ICD-10-CM | POA: Diagnosis present

## 2023-10-25 DIAGNOSIS — Z87891 Personal history of nicotine dependence: Secondary | ICD-10-CM | POA: Diagnosis not present

## 2023-10-25 DIAGNOSIS — Z8249 Family history of ischemic heart disease and other diseases of the circulatory system: Secondary | ICD-10-CM | POA: Diagnosis not present

## 2023-10-25 DIAGNOSIS — Z9842 Cataract extraction status, left eye: Secondary | ICD-10-CM | POA: Diagnosis not present

## 2023-10-25 DIAGNOSIS — I959 Hypotension, unspecified: Secondary | ICD-10-CM | POA: Diagnosis present

## 2023-10-25 DIAGNOSIS — Z833 Family history of diabetes mellitus: Secondary | ICD-10-CM | POA: Diagnosis not present

## 2023-10-25 DIAGNOSIS — J440 Chronic obstructive pulmonary disease with acute lower respiratory infection: Secondary | ICD-10-CM | POA: Diagnosis present

## 2023-10-25 LAB — BASIC METABOLIC PANEL WITH GFR
Anion gap: 8 (ref 5–15)
BUN: 14 mg/dL (ref 8–23)
CO2: 22 mmol/L (ref 22–32)
Calcium: 9 mg/dL (ref 8.9–10.3)
Chloride: 109 mmol/L (ref 98–111)
Creatinine, Ser: 0.97 mg/dL (ref 0.61–1.24)
GFR, Estimated: >60 mL/min (ref >60)
Glucose, Bld: 103 mg/dL — ABNORMAL HIGH (ref 70–99)
Potassium: 3.7 mmol/L (ref 3.5–5.1)
Sodium: 139 mmol/L (ref 135–145)

## 2023-10-25 LAB — CBC
HCT: 37.5 % — ABNORMAL LOW (ref 39.0–52.0)
Hemoglobin: 12 g/dL — ABNORMAL LOW (ref 13.0–17.0)
MCH: 28.8 pg (ref 26.0–34.0)
MCHC: 32 g/dL (ref 30.0–36.0)
MCV: 89.9 fL (ref 80.0–100.0)
Platelets: 246 K/uL (ref 150–400)
RBC: 4.17 MIL/uL — ABNORMAL LOW (ref 4.22–5.81)
RDW: 15 % (ref 11.5–15.5)
WBC: 8.6 K/uL (ref 4.0–10.5)
nRBC: 0 % (ref 0.0–0.2)

## 2023-10-25 LAB — RESPIRATORY PANEL BY PCR

## 2023-10-25 LAB — URINE CULTURE: Culture: <10000 — AB

## 2023-10-25 LAB — EXPECTORATED SPUTUM ASSESSMENT W GRAM STAIN, RFLX TO RESP C

## 2023-10-25 LAB — MAGNESIUM: Magnesium: 1.6 mg/dL — ABNORMAL LOW (ref 1.7–2.4)

## 2023-10-25 LAB — MRSA NEXT GEN BY PCR, NASAL: MRSA by PCR Next Gen: NOT DETECTED

## 2023-10-25 LAB — PHOSPHORUS: Phosphorus: 2.4 mg/dL — ABNORMAL LOW (ref 2.5–4.6)

## 2023-10-25 LAB — TSH: TSH: 0.33 u[IU]/mL — ABNORMAL LOW (ref 0.350–4.500)

## 2023-10-25 MED ORDER — ROSUVASTATIN CALCIUM 20 MG PO TABS
40.0000 mg | ORAL_TABLET | Freq: Every day | ORAL | Status: DC
  Administered 2023-10-25 – 2023-10-27 (×3): 40 mg via ORAL
  Filled 2023-10-25 (×3): qty 2

## 2023-10-25 MED ORDER — SODIUM CHLORIDE 0.9 % IV SOLN: INTRAVENOUS | Status: DC

## 2023-10-25 MED ORDER — AZITHROMYCIN 250 MG PO TABS
500.0000 mg | ORAL_TABLET | Freq: Every day | ORAL | Status: AC
  Administered 2023-10-25 – 2023-10-26 (×2): 500 mg via ORAL
  Filled 2023-10-25 (×2): qty 2

## 2023-10-25 MED ORDER — MELATONIN 3 MG PO TABS
6.0000 mg | ORAL_TABLET | Freq: Every evening | ORAL | Status: DC | PRN
  Administered 2023-10-25 – 2023-10-26 (×2): 6 mg via ORAL
  Filled 2023-10-25 (×2): qty 2

## 2023-10-25 MED ORDER — BUDESON-GLYCOPYRROL-FORMOTEROL 160-9-4.8 MCG/ACT IN AERO
2.0000 | INHALATION_SPRAY | Freq: Two times a day (BID) | RESPIRATORY_TRACT | Status: DC
  Administered 2023-10-25 – 2023-10-27 (×5): 2 via RESPIRATORY_TRACT
  Filled 2023-10-25: qty 5.9

## 2023-10-25 MED ORDER — ACETAMINOPHEN 500 MG PO TABS: 1000.0000 mg | ORAL_TABLET | Freq: Four times a day (QID) | ORAL | Status: DC | PRN

## 2023-10-25 MED ORDER — ONDANSETRON HCL 4 MG/2ML IJ SOLN: 4.0000 mg | Freq: Four times a day (QID) | INTRAMUSCULAR | Status: DC | PRN

## 2023-10-25 MED ORDER — PANTOPRAZOLE SODIUM 40 MG PO TBEC
40.0000 mg | DELAYED_RELEASE_TABLET | Freq: Every day | ORAL | Status: DC
  Administered 2023-10-25 – 2023-10-27 (×3): 40 mg via ORAL
  Filled 2023-10-25 (×3): qty 1

## 2023-10-25 MED ORDER — ENOXAPARIN SODIUM 40 MG/0.4ML IJ SOSY
40.0000 mg | PREFILLED_SYRINGE | INTRAMUSCULAR | Status: DC
  Filled 2023-10-25 (×2): qty 0.4

## 2023-10-25 MED ORDER — ALBUTEROL SULFATE (2.5 MG/3ML) 0.083% IN NEBU
2.5000 mg | INHALATION_SOLUTION | Freq: Three times a day (TID) | RESPIRATORY_TRACT | Status: DC
  Administered 2023-10-25 – 2023-10-27 (×6): 2.5 mg via RESPIRATORY_TRACT
  Filled 2023-10-25 (×5): qty 3

## 2023-10-25 MED ORDER — POLYETHYLENE GLYCOL 3350 17 G PO PACK: 17.0000 g | PACK | Freq: Every day | ORAL | Status: DC | PRN

## 2023-10-25 MED ORDER — CHLORHEXIDINE GLUCONATE CLOTH 2 % EX PADS
6.0000 | MEDICATED_PAD | Freq: Every day | CUTANEOUS | Status: DC
  Administered 2023-10-25: 6 via TOPICAL

## 2023-10-25 MED ORDER — SODIUM CHLORIDE 0.9 % IV SOLN
2.0000 g | INTRAVENOUS | Status: DC
  Administered 2023-10-25 – 2023-10-26 (×2): 2 g via INTRAVENOUS
  Filled 2023-10-25 (×2): qty 20

## 2023-10-25 MED ORDER — IPRATROPIUM-ALBUTEROL 0.5-2.5 (3) MG/3ML IN SOLN
3.0000 mL | Freq: Three times a day (TID) | RESPIRATORY_TRACT | Status: DC
  Administered 2023-10-25: 3 mL via RESPIRATORY_TRACT
  Filled 2023-10-25: qty 3

## 2023-10-25 MED ORDER — K PHOS MONO-SOD PHOS DI & MONO 155-852-130 MG PO TABS
250.0000 mg | ORAL_TABLET | Freq: Two times a day (BID) | ORAL | Status: DC
  Administered 2023-10-25 – 2023-10-27 (×5): 250 mg via ORAL
  Filled 2023-10-25 (×5): qty 1

## 2023-10-25 MED ORDER — SODIUM CHLORIDE 0.9% FLUSH
3.0000 mL | Freq: Two times a day (BID) | INTRAVENOUS | Status: DC
  Administered 2023-10-25 – 2023-10-27 (×6): 3 mL via INTRAVENOUS

## 2023-10-25 MED ORDER — MONTELUKAST SODIUM 10 MG PO TABS
10.0000 mg | ORAL_TABLET | Freq: Every day | ORAL | Status: DC
  Administered 2023-10-25 – 2023-10-26 (×2): 10 mg via ORAL
  Filled 2023-10-25 (×2): qty 1

## 2023-10-25 MED ORDER — FLUTICASONE PROPIONATE 50 MCG/ACT NA SUSP
2.0000 | Freq: Every day | NASAL | Status: DC
  Administered 2023-10-25: 2 via NASAL
  Filled 2023-10-25: qty 16

## 2023-10-25 MED ORDER — ALBUTEROL SULFATE (2.5 MG/3ML) 0.083% IN NEBU: 2.5000 mg | INHALATION_SOLUTION | RESPIRATORY_TRACT | Status: DC | PRN

## 2023-10-25 MED ORDER — MAGNESIUM SULFATE 4 GM/100ML IV SOLN
4.0000 g | Freq: Once | INTRAVENOUS | Status: AC
  Administered 2023-10-25: 4 g via INTRAVENOUS
  Filled 2023-10-25: qty 100

## 2023-10-25 MED ORDER — DILTIAZEM HCL ER COATED BEADS 120 MG PO CP24
120.0000 mg | ORAL_CAPSULE | Freq: Every day | ORAL | Status: DC
Start: 2023-10-25 — End: 2023-10-26
  Administered 2023-10-25 – 2023-10-26 (×2): 120 mg via ORAL
  Filled 2023-10-25 (×3): qty 1

## 2023-10-25 MED ORDER — ORAL CARE MOUTH RINSE: 15.0000 mL | OROMUCOSAL | Status: DC | PRN

## 2023-10-25 MED ORDER — SODIUM CHLORIDE 0.9 % IV SOLN: 1.0000 g | INTRAVENOUS | Status: DC

## 2023-10-25 MED ORDER — LORATADINE 10 MG PO TABS
10.0000 mg | ORAL_TABLET | Freq: Every day | ORAL | Status: DC
  Administered 2023-10-25 – 2023-10-27 (×3): 10 mg via ORAL
  Filled 2023-10-25 (×3): qty 1

## 2023-10-25 NOTE — [place] Notes (Signed)
 PROGRESS NOTE    Jesus Bush  FMW:[id number] DOB: [birth year] DOA: 10/24/2023 PCP: [name], NP    Chief Complaint  Patient presents with   Dizziness    Brief Narrative:  Patient 78 year old gentleman history of COPD, paroxysmal A-fib, hypertension, type 2 diabetes, BPH transferred to [facility] for hypotension.   Assessment & Plan:   Principal Problem:   Hypotension Active Problems:   Essential hypertension   Paroxysmal A-fib (HCC)   CAP (community acquired pneumonia)   Hypovolemia   Medication adverse effect   Hypomagnesemia   Hypophosphatemia   Benign prostatic hyperplasia   #1 hypotension, likely hypovolemic in the setting of antihypertensive medications -Patient presented with hypotension, with symptoms of lightheadedness, presyncope after being outside mowing the lawn for about 3 hours in intensity and BP checked by wife at that time with systolics in the 60s. - Patient also noted to be taking other antihypertensive medications and started a new medication for BPH 2 days prior to admission. - Status post 3 L IV fluid resuscitation with improvement with hypotension. - Urinalysis done with trace leukocyte nitrite negative, 0-5 WBCs.  Patient with no urinary symptoms. - Chest x-ray with mild diffuse interstitial opacities which may represent mild pulmonary edema however patient with a productive cough of whitish sputum and on antibiotics for possible CAP. - Alfuzosin  on hold, continue to hold antihypertensive medications, IV fluids.  2.  AKI -Patient noted on admission to have a creatinine of 1.59 with baseline creatinine of 0.8. - Likely secondary to a prerenal azotemia. - Improving with hydration.  3.??  CAP -Patient with reports of mild cough, URI symptoms. - Chest x-ray with patchy airspace opacities. - Patient not volume overloaded on examination. - BNP within normal limits. - Blood cultures pending with no growth to date. - Sputum Gram  stain and cultures pending. - Respiratory viral panel negative. - SARS coronavirus 2 PCR negative, influenza A and B by PCR negative.  RSV by PCR negative. - Continue IV Rocephin , azithromycin . - Start Flonase , Claritin , PPI.  4.  Hypomagnesemia/hypophosphatemia - Magnesium  1.6. - Phosphorus at 2.4. - Replete. - Repeat labs in AM.  5.  COPD -Stable. - Continue Trelegy Ellipta , nebs as needed.  6.  Paroxysmal A-fib -Continue decreased dose of Cardizem .  7.  Hypertension -Patient noted to present with hypotension. - Patient resumed on decreased dose of home regimen diltiazem . - Continue to hold other antihypertensive medications.  8.  Diabetes mellitus type 2 -Hemoglobin A1c 7.0.  -Continue to hold Jardiance . - SSI.  9.  BPH -Continue to hold alfuzosin .   DVT prophylaxis: Lovenox  Code Status: Full Family Communication: Updated patient.  No family at bedside. Disposition: Likely home when clinically improved hopefully in the next 24 to 48 hours.  Status is: Inpatient The patient will require care spanning > 2 midnights and should be moved to inpatient because: Severity of illness   Consultants:  None  Procedures:  Chest x-ray 10/24/2023   Antimicrobials:  Anti-infectives (From admission, onward)    Start     Dose/Rate Route Frequency Ordered Stop   10/25/23 2000  cefTRIAXone  (ROCEPHIN ) 1 g in sodium chloride  0.9 % 100 mL IVPB  Status:  Discontinued        1 g 200 mL/hr over 30 Minutes Intravenous Every 24 hours 10/25/23 0232 10/25/23 0827   10/25/23 1000  cefTRIAXone  (ROCEPHIN ) 2 g in sodium chloride  0.9 % 100 mL IVPB        2 g 200 mL/hr  over 30 Minutes Intravenous Every 24 hours 10/25/23 0827 10/29/23 0959   10/25/23 0231  azithromycin  (ZITHROMAX ) tablet 500 mg        500 mg Oral Daily at bedtime 10/25/23 0232 10/27/23 2159   10/24/23 1815  cefTRIAXone  (ROCEPHIN ) 1 g in sodium chloride  0.9 % 100 mL IVPB        1 g 200 mL/hr over 30 Minutes Intravenous  Once  10/24/23 1814 10/24/23 2037   10/24/23 1815  azithromycin  (ZITHROMAX ) 500 mg in sodium chloride  0.9 % 250 mL IVPB        500 mg 250 mL/hr over 60 Minutes Intravenous  Once 10/24/23 1814 10/24/23 1937         Subjective: Patient sitting up in recliner.  States he feels well.  Was hoping to go home.  Denies any chest pain no shortness of breath.  States has whitish sputum this morning.  Objective: Vitals:   10/25/23 1300 10/25/23 1400 10/25/23 1500 10/25/23 1600  BP:  (!) 124/53 (!) 130/56   Pulse: 88 89 80   Resp: 19 17 18    Temp:    97.6 F (36.4 C)  TempSrc:    Oral  SpO2: 97% 99% 93%   Weight:      Height:        Intake/Output Summary (Last 24 hours) at 10/25/2023 1713 Last data filed at 10/25/2023 1533 Gross per 24 hour  Intake 2863.71 ml  Output 1775 ml  Net 1088.71 ml   Filed Weights   10/25/23 0031  Weight: 70.6 kg    Examination:  General exam: Appears calm and comfortable  Respiratory system: Right basilar coarse breath sounds.  No wheezing.  Fair air movement.   Cardiovascular system: S1 & S2 heard, RRR. No JVD, murmurs, rubs, gallops or clicks. No pedal edema. Gastrointestinal system: Abdomen is nondistended, soft and nontender. No organomegaly or masses felt. Normal bowel sounds heard. Central nervous system: Alert and oriented. No focal neurological deficits. Extremities: Symmetric 5 x 5 power. Skin: No rashes, lesions or ulcers Psychiatry: Judgement and insight appear normal. Mood & affect appropriate.     Data Reviewed: I have personally reviewed following labs and imaging studies  CBC: Recent Labs  Lab 10/24/23 1620 10/24/23 1628 10/25/23 0337  WBC 10.6*  --  8.6  NEUTROABS 8.6*  --   --   HGB 11.2* 11.9* 12.0*  HCT 35.2* 35.0* 37.5*  MCV 88.7  --  89.9  PLT 317  --  246    Basic Metabolic Panel: Recent Labs  Lab 10/24/23 1620 10/24/23 1628 10/25/23 0337  NA 134* 135 139  K 3.7 3.6 3.7  CL 99  --  109  CO2 24  --  22  GLUCOSE  187*  --  103*  BUN 18  --  14  CREATININE 1.59*  --  0.97  CALCIUM  9.8  --  9.0  MG  --   --  1.6*  PHOS  --   --  2.4*    GFR: Estimated Creatinine Clearance: 62.7 mL/min (by C-G formula based on SCr of 0.97 mg/dL).  Liver Function Tests: Recent Labs  Lab 10/24/23 1620  AST 15  ALT 12  ALKPHOS 87  BILITOT 0.3  PROT 7.1  ALBUMIN 4.2    CBG: No results for input(s): GLUCAP in the last 168 hours.   Recent Results (from the past 240 hours)  Microscopic Examination     Status: None   Collection Time: 10/21/23 12:00 AM  Urine  Result Value Ref Range Status   WBC, UA 0-5 0 - 5 /hpf Final   RBC, Urine 0-2 0 - 2 /hpf Final   Epithelial Cells (non renal) 0-10 0 - 10 /hpf Final   Bacteria, UA None seen None seen/Few Final  Blood culture (routine x 2)     Status: None (Preliminary result)   Collection Time: 10/24/23  4:29 PM   Specimen: BLOOD  Result Value Ref Range Status   Specimen Description   Final    BLOOD BLOOD LEFT WRIST Performed at [facility], [address] [place], [place] [address]    Special Requests   Final    BOTTLES DRAWN AEROBIC AND ANAEROBIC Blood Culture adequate volume Performed at [facility], [address] [place], [place] [address]    Culture   Final    NO GROWTH < 12 HOURS Performed at [facility], [address] [place], [place] [address]    Report Status PENDING  Incomplete  Resp panel by RT-PCR (RSV, Flu A&B, Covid) Anterior Nasal Swab     Status: None   Collection Time: 10/24/23  6:50 PM   Specimen: Anterior Nasal Swab  Result Value Ref Range Status   SARS Coronavirus 2 by RT PCR NEGATIVE NEGATIVE Final    Comment: (NOTE) SARS-CoV-2 target nucleic acids are NOT DETECTED.  The SARS-CoV-2 RNA is generally detectable in upper respiratory specimens during the acute phase of infection. The lowest concentration of SARS-CoV-2 viral copies this assay can detect is 138 copies/mL. A  negative result does not preclude SARS-Cov-2 infection and should not be used as the sole basis for treatment or other patient management decisions. A negative result may occur with  improper specimen collection/handling, submission of specimen other than nasopharyngeal swab, presence of viral mutation(s) within the areas targeted by this assay, and inadequate number of viral copies(<138 copies/mL). A negative result must be combined with clinical observations, patient history, and epidemiological information. The expected result is Negative.  Fact Sheet for Patients:  BloggerCourse.com  Fact Sheet for Healthcare Providers:  SeriousBroker.it  This test is no t yet approved or cleared by the [place]  FDA and  has been authorized for detection and/or diagnosis of SARS-CoV-2 by FDA under an Emergency Use Authorization (EUA). This EUA will remain  in effect (meaning this test can be used) for the duration of the COVID-19 declaration under Section 564(b)(1) of the Act, 21 U.S.C.section 360bbb-3(b)(1), unless the authorization is terminated  or revoked sooner.       Influenza A by PCR NEGATIVE NEGATIVE Final   Influenza B by PCR NEGATIVE NEGATIVE Final    Comment: (NOTE) The Xpert Xpress SARS-CoV-2/FLU/RSV plus assay is intended as an aid in the diagnosis of influenza from Nasopharyngeal swab specimens and should not be used as a sole basis for treatment. Nasal washings and aspirates are unacceptable for Xpert Xpress SARS-CoV-2/FLU/RSV testing.  Fact Sheet for Patients: BloggerCourse.com  Fact Sheet for Healthcare Providers: SeriousBroker.it  This test is not yet approved or cleared by the [place]  FDA and has been authorized for detection and/or diagnosis of SARS-CoV-2 by FDA under an Emergency Use Authorization (EUA). This EUA will remain in effect (meaning this test can  be used) for the duration of the COVID-19 declaration under Section 564(b)(1) of the Act, 21 U.S.C. section 360bbb-3(b)(1), unless the authorization is terminated or revoked.     Resp Syncytial Virus by PCR NEGATIVE NEGATIVE Final    Comment: (NOTE)  Fact Sheet for Patients: BloggerCourse.com  Fact Sheet for Healthcare Providers: SeriousBroker.it  This test is not yet approved or cleared by the [place]  FDA and has been authorized for detection and/or diagnosis of SARS-CoV-2 by FDA under an Emergency Use Authorization (EUA). This EUA will remain in effect (meaning this test can be used) for the duration of the COVID-19 declaration under Section 564(b)(1) of the Act, 21 U.S.C. section 360bbb-3(b)(1), unless the authorization is terminated or revoked.  Performed at [facility], [address], [place], [place] [address]   Blood culture (routine x 2)     Status: None (Preliminary result)   Collection Time: 10/25/23  1:03 AM   Specimen: BLOOD LEFT ARM  Result Value Ref Range Status   Specimen Description   Final    BLOOD LEFT ARM Performed at [facility], [address], [place], [place] [address]    Special Requests   Final    BOTTLES DRAWN AEROBIC AND ANAEROBIC Blood Culture results may not be optimal due to an inadequate volume of blood received in culture bottles Performed at [facility], [address] [place], [place] [address]    Culture   Final    NO GROWTH < 12 HOURS Performed at [facility], [address], [place], [place] [address]    Report Status PENDING  Incomplete  MRSA Next Gen by PCR, Nasal     Status: None   Collection Time: 10/25/23  1:56 AM   Specimen: Nasal Mucosa; Nasal Swab  Result Value Ref Range Status   MRSA by PCR Next Gen NOT DETECTED NOT DETECTED Final    Comment: (NOTE) The GeneXpert MRSA Assay (FDA approved for NASAL specimens  only), is one component of a comprehensive MRSA colonization surveillance program. It is not intended to diagnose MRSA infection nor to guide or monitor treatment for MRSA infections. Test performance is not FDA approved in patients less than 44 years old. Performed at [facility], [address] [place], [place] [address]   Expectorated Sputum Assessment w Gram Stain, Rflx to Resp Cult     Status: None   Collection Time: 10/25/23  2:31 AM   Specimen: Sputum  Result Value Ref Range Status   Specimen Description SPUTUM  Final   Special Requests NONE  Final   Sputum evaluation   Final    THIS SPECIMEN IS ACCEPTABLE FOR SPUTUM CULTURE Performed at [facility], [address] [place], [place] [address]    Report Status 10/25/2023 FINAL  Final  Respiratory (~20 pathogens) panel by PCR     Status: None   Collection Time: 10/25/23  3:25 AM   Specimen: Nasopharyngeal Swab; Respiratory  Result Value Ref Range Status   Adenovirus NOT DETECTED NOT DETECTED Final   Coronavirus 229E NOT DETECTED NOT DETECTED Final    Comment: (NOTE) The Coronavirus on the Respiratory Panel, DOES NOT test for the novel  Coronavirus (2019 nCoV)    Coronavirus HKU1 NOT DETECTED NOT DETECTED Final   Coronavirus NL63 NOT DETECTED NOT DETECTED Final   Coronavirus OC43 NOT DETECTED NOT DETECTED Final   Metapneumovirus NOT DETECTED NOT DETECTED Final   Rhinovirus / Enterovirus NOT DETECTED NOT DETECTED Final   Influenza A NOT DETECTED NOT DETECTED Final   Influenza B NOT DETECTED NOT DETECTED Final   Parainfluenza Virus 1 NOT DETECTED NOT DETECTED Final   Parainfluenza Virus 2 NOT DETECTED NOT DETECTED Final   Parainfluenza Virus  3 NOT DETECTED NOT DETECTED Final   Parainfluenza Virus 4 NOT DETECTED NOT DETECTED Final   Respiratory Syncytial Virus NOT DETECTED NOT DETECTED Final   Bordetella pertussis NOT DETECTED NOT DETECTED Final   Bordetella Parapertussis NOT  DETECTED NOT DETECTED Final   Chlamydophila pneumoniae NOT DETECTED NOT DETECTED Final   Mycoplasma pneumoniae NOT DETECTED NOT DETECTED Final    Comment: Performed at [facility], [address], [place], [place] [address]         Radiology Studies: DG Chest Portable 1 View Result Date: 10/24/2023 CLINICAL DATA:  Hypotension EXAM: PORTABLE CHEST 1 VIEW COMPARISON:  Chest radiograph dated 02/13/2023 FINDINGS: Postsurgical changes of the left apex. Normal lung volumes. Mild diffuse interstitial opacities. No pleural effusion or pneumothorax. Enlarged cardiomediastinal silhouette is likely projectional. No acute osseous abnormality. IMPRESSION: Mild diffuse interstitial opacities, which may represent mild pulmonary edema. Electronically Signed   By: Limin  Xu M.D.   On: 10/24/2023 16:32        Scheduled Meds:  albuterol   2.5 mg Nebulization TID   azithromycin   500 mg Oral QHS   budesonide-glycopyrrolate-formoterol  2 puff Inhalation BID   Chlorhexidine  Gluconate Cloth  6 each Topical Daily   diltiazem   120 mg Oral Daily   enoxaparin  (LOVENOX ) injection  40 mg Subcutaneous Q24H   fluticasone   2 spray Each Nare Daily   loratadine   10 mg Oral Daily   montelukast   10 mg Oral QHS   pantoprazole   40 mg Oral Daily   phosphorus  250 mg Oral BID   rosuvastatin   40 mg Oral Daily   sodium chloride  flush  3 mL Intravenous Q12H   Continuous Infusions:  sodium chloride  100 mL/hr at 10/25/23 1533   cefTRIAXone  (ROCEPHIN )  IV Stopped (10/25/23 1055)     LOS: 0 days    Time spent: 40 mins No charge    Toribio Hummer, MD [facility]   To contact the attending provider between 7A-7P or the covering provider during after hours 7P-7A, please log into the web site [email or web address] and access using universal [facility] password for that web site. If you do not have the password, please call the hospital operator.  10/25/2023, 5:13 PM

## 2023-10-25 NOTE — Plan of Care (Signed)

## 2023-10-25 NOTE — [place] Notes (Incomplete)
 I have seen and assessed patient and agree with Dr. Donzell assessment and plan.

## 2023-10-25 NOTE — H&P (Signed)
 History and Physical    Jesus Bush FMW:[id number] DOB: 02-Jun-1945 DOA: 10/24/2023  PCP: [name], NP   Patient coming from: Transfer from [facility] ED   Chief Complaint:  Chief Complaint  Patient presents with   Dizziness    HPI:  Jesus Bush is a 78 y.o. male with hx of COPD, paroxysmal A-fib, hypertension, DM type 2, BPH, who is transferred from [facility] ED for hypotension.  He reports that he had been feeling well until yesterday he was outside mowing his grass for about 3 hours.  Began to feel lightheaded and presyncopal.  Denies any syncope or fall.  Went inside and wife checked his blood pressure and says it was low in the 60s systolic.  Notes that he did start a new medication for BPH 2 days prior and has been taking all his other antihypertensives as prescribed.  Otherwise does acknowledge a recent cough minimally productive and minimal URI symptoms.  No fevers or chills.  Denies any other recent illness.  No chest pain, palpitations with his presyncopal event   Review of Systems:  ROS complete and negative except as marked above   Allergies  Allergen Reactions   Ppd [Tuberculin Purified Protein Derivative]     Positive PPD.    Prior to Admission medications   Medication Sig Start Date End Date Taking? Authorizing Provider  acetaminophen  (TYLENOL ) 500 MG tablet Take 1,000 mg by mouth daily as needed for moderate pain (pain score 4-6).    [provider]  alfuzosin  (UROXATRAL ) 10 MG 24 hr tablet Take 1 tablet (10 mg total) by mouth daily with breakfast. 10/21/23   [name], MD  benzonatate  (TESSALON  PERLES) 100 MG capsule Take 1 capsule (100 mg total) by mouth 3 (three) times daily as needed. 06/26/23 06/25/24  [name], NP  Blood Glucose Monitoring Suppl (ONE TOUCH ULTRA 2) w/Device KIT Use as directed to check blood sugars three times daily E11.69 10/09/23   [name], NP  cetirizine  (ZYRTEC  ALLERGY) 10 MG tablet Take one tablet nightly for  allergies. 06/26/23   [name], NP  diltiazem  (DILACOR XR ) 240 MG 24 hr capsule Take 1 capsule (240 mg total) by mouth daily FOR BLOOD PRESSURE AND HEART RHYTHM 04/10/23   Wilkinson, Dana E, NP  empagliflozin  (JARDIANCE ) 10 MG TABS tablet Take 1 tablet (10 mg total) by mouth daily before breakfast. 06/12/23   [name], NP  famotidine  (PEPCID ) 40 MG tablet Take 1 tablet (40 mg total) by mouth 2 (two) times daily to prevent heartburn & indigestion 06/18/23   [name], NP  Fluticasone -Umeclidin-Vilant (TRELEGY ELLIPTA ) 200-62.5-25 MCG/ACT AEPB Inhale 1 puff into the lungs daily for COPD /Asthma 04/10/23   Wilkinson, Dana E, NP  gabapentin  (NEURONTIN ) 300 MG capsule Take 1 capsule (300 mg total) by mouth at 1-2 hours before bedtime as needed. 04/10/23   Wilkinson, Dana E, NP  glucose blood (ONETOUCH ULTRA) test strip Use as instructed to check blood sugars 3 times daily E11.69 10/09/23   [name], NP  glucose blood (TRUE METRIX BLOOD GLUCOSE TEST) test strip Use as instructed to check blood sugars 10/02/23   [name], NP  guaiFENesin -dextromethorphan  (ROBITUSSIN DM) 100-10 MG/5ML syrup Take 5 mLs by mouth every 4 (four) hours as needed for cough. 06/26/23   [name], NP  Lancets [facility] ULTRASOFT) lancets Use as instructed to check blood sugars three times daily E11.69 10/09/23   [name], NP  losartan  (COZAAR ) 100 MG tablet Take 1 tablet (  100 mg total) by mouth daily for blood pressure. 04/10/23   Wilkinson, Dana E, NP  magnesium  gluconate (MAGONATE) 500 MG tablet Take 500 mg by mouth daily.     [provider]  montelukast  (SINGULAIR ) 10 MG tablet Take 1 tablet (10 mg total) by mouth daily for allergies. 04/10/23   Wilkinson, Dana E, NP  rosuvastatin  (CRESTOR ) 40 MG tablet Take 1 tablet (40 mg total) by mouth daily for cholesterol. 04/10/23   Wilkinson, Dana E, NP  True Comfort Safety Lancets MISC Use as directed to check blood sugars E11.65 10/02/23   [name], NP  VITAMIN D  PO  Take 5,000 Units by mouth daily.    [provider]  Vonoprazan Fumarate  (VOQUEZNA ) 10 MG TABS Take 1 tablet by mouth daily. 09/25/23   [name], NP  glipiZIDE  (GLUCOTROL ) 5 MG tablet Take 0.5-1 tablets (2.5-5 mg total) by mouth 2 (two) times daily with a meal for diabetes. 04/10/23 10/14/23  Jude Lonell BRAVO, NP    Past Medical History:  Diagnosis Date   COPD (chronic obstructive pulmonary disease) (HCC)    Diabetes mellitus without complication (HCC)    Hyperlipidemia    Hypertension    Vitamin D  deficiency     Past Surgical History:  Procedure Laterality Date   CATARACT EXTRACTION, BILATERAL Bilateral 2018   Dr. Rosealee   INTERCOSTAL NERVE BLOCK  08/27/2022   Procedure: INTERCOSTAL NERVE BLOCK;  Surgeon: Shyrl Linnie KIDD, MD;  Location: [facility] OR;  Service: Thoracic;;     reports that he quit smoking about 16 months ago. His smoking use included cigarettes. He started smoking about 38 years ago. He has a 44 pack-year smoking history. He has never been exposed to tobacco smoke. He has never used smokeless tobacco. He reports that he does not drink alcohol and does not use drugs.  Family History  Problem Relation Age of Onset   Stroke Mother    Heart disease Father    Heart attack Father    Diabetes Sister    Heart disease Sister    Heart attack Brother    Hypertension Brother    Colon cancer Neg Hx    Stomach cancer Neg Hx    Esophageal cancer Neg Hx      Physical Exam: Vitals:   10/25/23 0300 10/25/23 0400 10/25/23 0500 10/25/23 0600  BP: (!) 111/43 (!) 104/31 (!) 114/41 (!) 102/50  Pulse: 68 70 73 73  Resp: 18 19 20 18   Temp:  98.2 F (36.8 C)    TempSrc:  Oral    SpO2: 92% 91% 91% 92%  Weight:      Height:        Gen: Awake, alert, NAD   CV: Regular, normal S1, S2, no murmurs  Resp: Normal WOB, CTAB  Abd: Flat, normoactive, nontender MSK: Symmetric, no edema  Skin: No rashes or lesions to exposed skin  Neuro: Alert and interactive  Psych:  euthymic, appropriate    Data review:   Labs reviewed, notable for:   Creatinine 1.59 (baseline 0.8)  Lactate 1.3 -> 1  Micro:  Results for orders placed or performed during the hospital encounter of 10/24/23  Resp panel by RT-PCR (RSV, Flu A&B, Covid) Anterior Nasal Swab     Status: None   Collection Time: 10/24/23  6:50 PM   Specimen: Anterior Nasal Swab  Result Value Ref Range Status   SARS Coronavirus 2 by RT PCR NEGATIVE NEGATIVE Final    Comment: (NOTE) SARS-CoV-2 target nucleic  acids are NOT DETECTED.  The SARS-CoV-2 RNA is generally detectable in upper respiratory specimens during the acute phase of infection. The lowest concentration of SARS-CoV-2 viral copies this assay can detect is 138 copies/mL. A negative result does not preclude SARS-Cov-2 infection and should not be used as the sole basis for treatment or other patient management decisions. A negative result may occur with  improper specimen collection/handling, submission of specimen other than nasopharyngeal swab, presence of viral mutation(s) within the areas targeted by this assay, and inadequate number of viral copies(<138 copies/mL). A negative result must be combined with clinical observations, patient history, and epidemiological information. The expected result is Negative.  Fact Sheet for Patients:  BloggerCourse.com  Fact Sheet for Healthcare Providers:  SeriousBroker.it  This test is no t yet approved or cleared by the [place]  FDA and  has been authorized for detection and/or diagnosis of SARS-CoV-2 by FDA under an Emergency Use Authorization (EUA). This EUA will remain  in effect (meaning this test can be used) for the duration of the COVID-19 declaration under Section 564(b)(1) of the Act, 21 U.S.C.section 360bbb-3(b)(1), unless the authorization is terminated  or revoked sooner.       Influenza A by PCR NEGATIVE NEGATIVE Final    Influenza B by PCR NEGATIVE NEGATIVE Final    Comment: (NOTE) The Xpert Xpress SARS-CoV-2/FLU/RSV plus assay is intended as an aid in the diagnosis of influenza from Nasopharyngeal swab specimens and should not be used as a sole basis for treatment. Nasal washings and aspirates are unacceptable for Xpert Xpress SARS-CoV-2/FLU/RSV testing.  Fact Sheet for Patients: BloggerCourse.com  Fact Sheet for Healthcare Providers: SeriousBroker.it  This test is not yet approved or cleared by the [place]  FDA and has been authorized for detection and/or diagnosis of SARS-CoV-2 by FDA under an Emergency Use Authorization (EUA). This EUA will remain in effect (meaning this test can be used) for the duration of the COVID-19 declaration under Section 564(b)(1) of the Act, 21 U.S.C. section 360bbb-3(b)(1), unless the authorization is terminated or revoked.     Resp Syncytial Virus by PCR NEGATIVE NEGATIVE Final    Comment: (NOTE) Fact Sheet for Patients: BloggerCourse.com  Fact Sheet for Healthcare Providers: SeriousBroker.it  This test is not yet approved or cleared by the [place]  FDA and has been authorized for detection and/or diagnosis of SARS-CoV-2 by FDA under an Emergency Use Authorization (EUA). This EUA will remain in effect (meaning this test can be used) for the duration of the COVID-19 declaration under Section 564(b)(1) of the Act, 21 U.S.C. section 360bbb-3(b)(1), unless the authorization is terminated or revoked.  Performed at [facility], [address] [place], [place] [address]   MRSA Next Gen by PCR, Nasal     Status: None   Collection Time: 10/25/23  1:56 AM   Specimen: Nasal Mucosa; Nasal Swab  Result Value Ref Range Status   MRSA by PCR Next Gen NOT DETECTED NOT DETECTED Final    Comment: (NOTE) The GeneXpert MRSA Assay (FDA approved  for NASAL specimens only), is one component of a comprehensive MRSA colonization surveillance program. It is not intended to diagnose MRSA infection nor to guide or monitor treatment for MRSA infections. Test performance is not FDA approved in patients less than 68 years old. Performed at [facility], [address], [place], [place] [address]     Imaging reviewed:  DG Chest Portable 1 View Result Date: 10/24/2023 CLINICAL DATA:  Hypotension EXAM: PORTABLE CHEST 1  VIEW COMPARISON:  Chest radiograph dated 02/13/2023 FINDINGS: Postsurgical changes of the left apex. Normal lung volumes. Mild diffuse interstitial opacities. No pleural effusion or pneumothorax. Enlarged cardiomediastinal silhouette is likely projectional. No acute osseous abnormality. IMPRESSION: Mild diffuse interstitial opacities, which may represent mild pulmonary edema. Electronically Signed   By: Limin  Xu M.D.   On: 10/24/2023 16:32    EKG:  Personally reviewed, sinus rhythm, no acute ischemic changes  ED Course:  Treated with 3 L IV fluid, ceftriaxone , azithromycin    Assessment/Plan:  78 y.o. male with hx COPD, paroxysmal A-fib, hypertension, DM type 2, BPH, who is transferred from [facility] ED for hypotension  Hypotension, likely hypovolemic and medication adverse effect - S/p 3 L IV fluid with improvement in BP.  Continue oral hydration. - Temporarily held his home Alfuzosin , Jardiance , and reduced diltiazem  to 120 mg daily.  Unclear if he is still taking losartan  but have held this as well.  - May be best to temporarily hold the Alfuzosin  and consider alternative therapy / flomax nightly at discharge. Also may be best to temporarily hold his Jardiance  with volume depletion.   Acute kidney injury stage I Baseline creatinine 0.8, elevated to 1.59 on admission.  Suspect this is prerenal in the setting of hypotension. - See med changes / IVF per above.   Question of community-acquired pneumonia He  reports mild cough and URI symptoms.  Chest x-ray with patchy airspace opacity.  Does not clinically appear consistent with heart failure/edema despite read; BNP wnl. Suspect CAP. Flu / COVID / RSV neg.  - Send RVP, sputum culture - Continue ceftriaxone  1 g IV every 24 hours, azithromycin  500 mg daily for now - Nebs prn  Chronic medical problems: COPD: Subhome Trelegy Ellipta , nebs as needed Paroxysmal A-fib: Currently in sinus rhythm, see change in diltiazem  per above Hypertension: See hypotension above and med changes Diabetes type 2: See above, holding Jardiance  Hyperlipidemia: Continue on rosuvastatin  BPH: See above, holding Alfuzosin    Body mass index is 22.98 kg/m.     DVT prophylaxis:  Lovenox  Code Status:  Full Code Diet:  Diet Orders (From admission, onward)     Start     Ordered   10/25/23 0230  Diet regular Room service appropriate? Yes; Fluid consistency: Thin  Diet effective now       Question Answer Comment  Room service appropriate? Yes   Fluid consistency: Thin      10/25/23 0232           Family Communication:  None   Consults:  None   Admission status:   Observation, Step Down Unit  Severity of Illness: The appropriate patient status for this patient is OBSERVATION. Observation status is judged to be reasonable and necessary in order to provide the required intensity of service to ensure the patient's safety. The patient's presenting symptoms, physical exam findings, and initial radiographic and laboratory data in the context of their medical condition is felt to place them at decreased risk for further clinical deterioration. Furthermore, it is anticipated that the patient will be medically stable for discharge from the hospital within 2 midnights of admission.    Dorn Dawson, MD [facility]  How to contact the [facility] Attending or Consulting provider 7A - 7P or covering provider during after hours 7P -7A, for this patient.  Check the care  team in [facility] and look for a) attending/consulting [facility] provider listed and b) the [facility] team listed Log into [email or web address] and use 's universal password  to access. If you do not have the password, please contact the hospital operator. Locate the [facility] provider you are looking for under [facility] and page to a number that you can be directly reached. If you still have difficulty reaching the provider, please page the [facility] (Director on Call) for the Hospitalists listed on amion for assistance.  10/25/2023, 7:16 AM

## 2023-10-25 NOTE — Plan of Care (Signed)
  Problem: Education: Goal: Knowledge of General Education information will improve Description: Including pain rating scale, medication(s)/side effects and non-pharmacologic comfort measures Outcome: Progressing   Problem: Activity: Goal: Risk for activity intolerance will decrease Outcome: Progressing   Problem: Elimination: Goal: Will not experience complications related to urinary retention Outcome: Progressing   Problem: Pain Managment: Goal: General experience of comfort will improve and/or be controlled Outcome: Progressing   Problem: Safety: Goal: Ability to remain free from injury will improve Outcome: Progressing

## 2023-10-26 ENCOUNTER — Other Ambulatory Visit: Payer: Self-pay

## 2023-10-26 DIAGNOSIS — N400 Enlarged prostate without lower urinary tract symptoms: Secondary | ICD-10-CM | POA: Diagnosis not present

## 2023-10-26 DIAGNOSIS — I4729 Other ventricular tachycardia: Secondary | ICD-10-CM | POA: Insufficient documentation

## 2023-10-26 DIAGNOSIS — E861 Hypovolemia: Secondary | ICD-10-CM | POA: Diagnosis not present

## 2023-10-26 LAB — RENAL FUNCTION PANEL
Albumin: 3 g/dL — ABNORMAL LOW (ref 3.5–5.0)
Anion gap: 10 (ref 5–15)
BUN: 9 mg/dL (ref 8–23)
CO2: 22 mmol/L (ref 22–32)
Calcium: 8.7 mg/dL — ABNORMAL LOW (ref 8.9–10.3)
Chloride: 108 mmol/L (ref 98–111)
Creatinine, Ser: 0.7 mg/dL (ref 0.61–1.24)
GFR, Estimated: >60 mL/min (ref >60)
Glucose, Bld: 122 mg/dL — ABNORMAL HIGH (ref 70–99)
Phosphorus: 3.3 mg/dL (ref 2.5–4.6)
Potassium: 3.6 mmol/L (ref 3.5–5.1)
Sodium: 140 mmol/L (ref 135–145)

## 2023-10-26 LAB — CBC WITH DIFFERENTIAL/PLATELET
Abs Immature Granulocytes: 0.03 K/uL (ref 0.00–0.07)
Basophils Absolute: 0.1 K/uL (ref 0.0–0.1)
Basophils Relative: 1 %
Eosinophils Absolute: 0 K/uL (ref 0.0–0.5)
Eosinophils Relative: 0 %
HCT: 34.6 % — ABNORMAL LOW (ref 39.0–52.0)
Hemoglobin: 10.4 g/dL — ABNORMAL LOW (ref 13.0–17.0)
Immature Granulocytes: 0 %
Lymphocytes Relative: 13 %
Lymphs Abs: 1.2 K/uL (ref 0.7–4.0)
MCH: 28.2 pg (ref 26.0–34.0)
MCHC: 30.1 g/dL (ref 30.0–36.0)
MCV: 93.8 fL (ref 80.0–100.0)
Monocytes Absolute: 0.8 K/uL (ref 0.1–1.0)
Monocytes Relative: 9 %
Neutro Abs: 7 K/uL (ref 1.7–7.7)
Neutrophils Relative %: 77 %
Platelets: 304 K/uL (ref 150–400)
RBC: 3.69 MIL/uL — ABNORMAL LOW (ref 4.22–5.81)
RDW: 14.9 % (ref 11.5–15.5)
WBC: 9.1 K/uL (ref 4.0–10.5)
nRBC: 0 % (ref 0.0–0.2)

## 2023-10-26 LAB — FOLATE: Folate: 7.9 ng/mL (ref >5.9)

## 2023-10-26 LAB — VITAMIN B12: Vitamin B-12: 145 pg/mL — ABNORMAL LOW (ref 180–914)

## 2023-10-26 LAB — IRON AND TIBC
Iron: 33 ug/dL — ABNORMAL LOW (ref 45–182)
Saturation Ratios: 10 % — ABNORMAL LOW (ref 17.9–39.5)
TIBC: 333 ug/dL (ref 250–450)
UIBC: 300 ug/dL

## 2023-10-26 LAB — FERRITIN: Ferritin: 13 ng/mL — ABNORMAL LOW (ref 24–336)

## 2023-10-26 LAB — T4, FREE: Free T4: 1.12 ng/dL (ref 0.61–1.12)

## 2023-10-26 LAB — MAGNESIUM: Magnesium: 2.2 mg/dL (ref 1.7–2.4)

## 2023-10-26 MED ORDER — CYANOCOBALAMIN 1000 MCG/ML IJ SOLN
1000.0000 ug | Freq: Every day | INTRAMUSCULAR | Status: DC
  Administered 2023-10-26 – 2023-10-27 (×2): 1000 ug via SUBCUTANEOUS
  Filled 2023-10-26 (×2): qty 1

## 2023-10-26 MED ORDER — DILTIAZEM HCL ER COATED BEADS 120 MG PO CP24
120.0000 mg | ORAL_CAPSULE | Freq: Once | ORAL | Status: AC
  Administered 2023-10-26: 120 mg via ORAL
  Filled 2023-10-26: qty 1

## 2023-10-26 MED ORDER — PANTOPRAZOLE SODIUM 40 MG PO TBEC
40.0000 mg | DELAYED_RELEASE_TABLET | Freq: Every day | ORAL | 1 refills | Status: DC
  Filled 2023-10-26: qty 30, 30d supply, fill #0

## 2023-10-26 MED ORDER — AMOXICILLIN-POT CLAVULANATE 875-125 MG PO TABS
1.0000 | ORAL_TABLET | Freq: Two times a day (BID) | ORAL | Status: DC
  Administered 2023-10-27: 1 via ORAL
  Filled 2023-10-26: qty 1

## 2023-10-26 MED ORDER — DILTIAZEM HCL ER COATED BEADS 120 MG PO CP24
240.0000 mg | ORAL_CAPSULE | Freq: Every day | ORAL | Status: DC
  Administered 2023-10-27: 240 mg via ORAL
  Filled 2023-10-26: qty 2

## 2023-10-26 MED ORDER — POTASSIUM CHLORIDE CRYS ER 20 MEQ PO TBCR
40.0000 meq | EXTENDED_RELEASE_TABLET | Freq: Once | ORAL | Status: AC
  Administered 2023-10-26: 40 meq via ORAL
  Filled 2023-10-26: qty 2

## 2023-10-26 MED ORDER — ALBUTEROL SULFATE HFA 108 (90 BASE) MCG/ACT IN AERS
2.0000 | INHALATION_SPRAY | Freq: Four times a day (QID) | RESPIRATORY_TRACT | 2 refills | Status: AC | PRN
  Filled 2023-10-26 – 2023-10-28 (×2): qty 6.7, 25d supply, fill #0
  Filled 2024-01-17: qty 6.7, 25d supply, fill #1

## 2023-10-26 MED ORDER — AMOXICILLIN-POT CLAVULANATE 875-125 MG PO TABS
1.0000 | ORAL_TABLET | Freq: Two times a day (BID) | ORAL | 0 refills | Status: AC
  Filled 2023-10-26 – 2023-10-28 (×2): qty 10, 5d supply, fill #0

## 2023-10-26 NOTE — Progress Notes (Addendum)
 PROGRESS NOTE    Jesus Bush  FMW:[id number] DOB: 12-29-1945 DOA: 10/24/2023 PCP: [name], NP    Chief Complaint  Patient presents with   Dizziness    Brief Narrative:  Patient 78 year old gentleman history of COPD, paroxysmal A-fib, hypertension, type 2 diabetes, BPH transferred to [facility] for hypotension.   Assessment & Plan:   Principal Problem:   Hypotension Active Problems:   Essential hypertension   Paroxysmal A-fib (HCC)   CAP (community acquired pneumonia)   Hypovolemia   Medication adverse effect   Hypomagnesemia   Hypophosphatemia   Benign prostatic hyperplasia   NSVT (nonsustained ventricular tachycardia) (HCC)   #1 hypotension, likely hypovolemic in the setting of antihypertensive medications -Patient presented with hypotension, with symptoms of lightheadedness, presyncope after being outside mowing the lawn for about 3 hours in intensity and BP checked by wife at that time with systolics in the 60s. - Patient also noted to be taking other antihypertensive medications and started a new medication for BPH 2 days prior to admission. - Status post 3 L IV fluid resuscitation with improvement with hypotension. - Urinalysis done with trace leukocyte nitrite negative, 0-5 WBCs.  Patient with no urinary symptoms. - Chest x-ray with mild diffuse interstitial opacities which may represent mild pulmonary edema however patient with a productive cough of whitish sputum and on antibiotics for possible CAP. - Alfuzosin  on hold, continue to hold antihypertensive medications. - Saline lock IV fluids and monitor blood pressure of IV fluids.  2.  AKI -Patient noted on admission to have a creatinine of 1.59 with baseline creatinine of 0.8. - Likely secondary to a prerenal azotemia. -Improved and resolved with hydration. -Saline lock IV fluids.  3.??  CAP -Patient with reports of mild cough, URI symptoms. - Chest x-ray with patchy airspace opacities. -  Patient not volume overloaded on examination. - BNP within normal limits. - Blood cultures pending with no growth to date. - Sputum Gram stain and cultures pending. - Respiratory viral panel negative. - SARS coronavirus 2 PCR negative, influenza A and B by PCR negative.  RSV by PCR negative. - Transition from IV Rocephin  to Augmentin  tomorrow 10/27/2023.  -Patient to receive last dose of azithromycin  today.   - Continue Claritin , PPI, Flonase .    4.  Hypomagnesemia/hypophosphatemia - Repleted.   - Magnesium  at 2.2 from 1.6.   - Phosphorus at 3.3 from 2.4.   - Repeat labs in the AM.  5.  COPD -Stable. - Continue Trelegy Ellipta , nebs as needed.  6.  Paroxysmal A-fib - Increase Cardizem  back to home regimen of  240 mg daily.    7.  Hypertension -Patient noted to present with hypotension. - Patient resumed on decreased dose of home regimen diltiazem . - Continue to hold other antihypertensive medications.  8.  Diabetes mellitus type 2 -Hemoglobin A1c 7.0.  -Continue to hold Jardiance . - Continue SSI.  9.  BPH -Continue to hold alfuzosin . - May need to be placed on Flomax instead on discharge.  10.  10 beat run of NSVT -Per RN patient with a 10 beat run of NSVT and remained asymptomatic. -Likely secondary to decreased dose of Cardizem  CD on admission. - Patient with no chest pain. - Electrolytes repleted. - TSH noted at 0.330. - Check a free T4. - Check a 2D echo. - Will increase Cardizem  CD back to home dose of 240 mg daily. - Keep potassium approximately 4, magnesium  approximately 2.  11.  Abnormal TSH -TSH noted at 0.330. -  Check a free T4. - Outpatient follow-up.    DVT prophylaxis: Lovenox  Code Status: Full Family Communication: Updated patient, wife, sister at bedside. Disposition: Likely home when clinically improved hopefully in the next 24.  Status is: Inpatient The patient will require care spanning > 2 midnights and should be moved to inpatient because:  Severity of illness   Consultants:  None  Procedures:  Chest x-ray 10/24/2023   Antimicrobials:  Anti-infectives (From admission, onward)    Start     Dose/Rate Route Frequency Ordered Stop   10/27/23 1000  amoxicillin -clavulanate (AUGMENTIN ) 875-125 MG per tablet 1 tablet        1 tablet Oral Every 12 hours 10/26/23 1120 11/01/23 0959   10/27/23 0000  amoxicillin -clavulanate (AUGMENTIN ) 875-125 MG tablet        1 tablet Oral Every 12 hours 10/26/23 1138 11/01/23 2359   10/25/23 2000  cefTRIAXone  (ROCEPHIN ) 1 g in sodium chloride  0.9 % 100 mL IVPB  Status:  Discontinued        1 g 200 mL/hr over 30 Minutes Intravenous Every 24 hours 10/25/23 0232 10/25/23 0827   10/25/23 1000  cefTRIAXone  (ROCEPHIN ) 2 g in sodium chloride  0.9 % 100 mL IVPB  Status:  Discontinued        2 g 200 mL/hr over 30 Minutes Intravenous Every 24 hours 10/25/23 0827 10/26/23 1120   10/25/23 0231  azithromycin  (ZITHROMAX ) tablet 500 mg        500 mg Oral Daily at bedtime 10/25/23 0232 10/27/23 2159   10/24/23 1815  cefTRIAXone  (ROCEPHIN ) 1 g in sodium chloride  0.9 % 100 mL IVPB        1 g 200 mL/hr over 30 Minutes Intravenous  Once 10/24/23 1814 10/24/23 2037   10/24/23 1815  azithromycin  (ZITHROMAX ) 500 mg in sodium chloride  0.9 % 250 mL IVPB        500 mg 250 mL/hr over 60 Minutes Intravenous  Once 10/24/23 1814 10/24/23 1937         Subjective: Patient laying in recliner.  Feels much better.  Denies any chest pain or shortness of breath.  Eager to go home.  Wife and sister at bedside.  Per RN patient this morning with a 10 beat run of NSVT.  Objective: Vitals:   10/26/23 1300 10/26/23 1351 10/26/23 1400 10/26/23 1500  BP: (!) 134/48  121/65 (!) 113/52  Pulse: 68  75 67  Resp: 15  (!) 21 (!) 21  Temp:  97.9 F (36.6 C)    TempSrc:  Oral    SpO2: 94%  100% 94%  Weight:      Height:        Intake/Output Summary (Last 24 hours) at 10/26/2023 1548 Last data filed at 10/26/2023 0939 Gross per 24  hour  Intake 1653.45 ml  Output 2075 ml  Net -421.55 ml   Filed Weights   10/25/23 0031  Weight: 70.6 kg    Examination:  General exam: NAD. Respiratory system: Decreased right basilar coarse breath sounds.  No wheezing.  No crackles.  Fair air movement.  Speaking in full sentences.  Cardiovascular system: Regular rate rhythm no murmurs rubs or gallops.  No JVD.  No lower extremity edema.  Gastrointestinal system: Abdomen is obese, soft, nontender, positive bowel sounds.  No rebound.  No guarding.  Central nervous system: Alert and oriented. No focal neurological deficits. Extremities: Symmetric 5 x 5 power. Skin: No rashes, lesions or ulcers Psychiatry: Judgement and insight appear normal. Mood & affect  appropriate.     Data Reviewed: I have personally reviewed following labs and imaging studies  CBC: Recent Labs  Lab 10/24/23 1620 10/24/23 1628 10/25/23 0337 10/26/23 0331  WBC 10.6*  --  8.6 9.1  NEUTROABS 8.6*  --   --  7.0  HGB 11.2* 11.9* 12.0* 10.4*  HCT 35.2* 35.0* 37.5* 34.6*  MCV 88.7  --  89.9 93.8  PLT 317  --  246 304    Basic Metabolic Panel: Recent Labs  Lab 10/24/23 1620 10/24/23 1628 10/25/23 0337 10/26/23 0331  NA 134* 135 139 140  K 3.7 3.6 3.7 3.6  CL 99  --  109 108  CO2 24  --  22 22  GLUCOSE 187*  --  103* 122*  BUN 18  --  14 9  CREATININE 1.59*  --  0.97 0.70  CALCIUM  9.8  --  9.0 8.7*  MG  --   --  1.6* 2.2  PHOS  --   --  2.4* 3.3    GFR: Estimated Creatinine Clearance: 76 mL/min (by C-G formula based on SCr of 0.7 mg/dL).  Liver Function Tests: Recent Labs  Lab 10/24/23 1620 10/26/23 0331  AST 15  --   ALT 12  --   ALKPHOS 87  --   BILITOT 0.3  --   PROT 7.1  --   ALBUMIN 4.2 3.0*    CBG: No results for input(s): GLUCAP in the last 168 hours.   Recent Results (from the past 240 hours)  Microscopic Examination     Status: None   Collection Time: 10/21/23 12:00 AM   Urine  Result Value Ref Range Status    WBC, UA 0-5 0 - 5 /hpf Final   RBC, Urine 0-2 0 - 2 /hpf Final   Epithelial Cells (non renal) 0-10 0 - 10 /hpf Final   Bacteria, UA None seen None seen/Few Final  Blood culture (routine x 2)     Status: None (Preliminary result)   Collection Time: 10/24/23  4:29 PM   Specimen: BLOOD  Result Value Ref Range Status   Specimen Description   Final    BLOOD BLOOD LEFT WRIST Performed at [facility], [address], [place], [place] [address]    Special Requests   Final    BOTTLES DRAWN AEROBIC AND ANAEROBIC Blood Culture adequate volume Performed at [facility], [address], [place], [place] [address]    Culture   Final    NO GROWTH 2 DAYS Performed at [facility], [address], [place], [place] [address]    Report Status PENDING  Incomplete  Urine Culture     Status: Abnormal   Collection Time: 10/24/23  5:21 PM   Specimen: Urine, Clean Catch  Result Value Ref Range Status   Specimen Description   Final    URINE, CLEAN CATCH Performed at [facility], [address] [place], [place] [address]    Special Requests   Final    NONE Performed at [facility], [address], [place], [place] [address]    Culture (A)  Final    <10,000 COLONIES/mL INSIGNIFICANT GROWTH Performed at [facility], [address], [place], [place] [address]    Report Status 10/25/2023 FINAL  Final  Resp panel by RT-PCR (RSV, Flu A&B, Covid) Anterior Nasal Swab     Status: None   Collection Time: 10/24/23  6:50 PM   Specimen: Anterior Nasal Swab  Result Value Ref Range Status   SARS Coronavirus 2 by RT PCR NEGATIVE NEGATIVE Final    Comment: (NOTE) SARS-CoV-2 target nucleic acids are NOT DETECTED.  The SARS-CoV-2 RNA is generally detectable in upper respiratory specimens during the acute phase of infection. The lowest concentration of SARS-CoV-2 viral copies this assay can detect is 138 copies/mL. A  negative result does not preclude SARS-Cov-2 infection and should not be used as the sole basis for treatment or other patient management decisions. A negative result may occur with  improper specimen collection/handling, submission of specimen other than nasopharyngeal swab, presence of viral mutation(s) within the areas targeted by this assay, and inadequate number of viral copies(<138 copies/mL). A negative result must be combined with clinical observations, patient history, and epidemiological information. The expected result is Negative.  Fact Sheet for Patients:  BloggerCourse.com  Fact Sheet for Healthcare Providers:  SeriousBroker.it  This test is no t yet approved or cleared by the [place]  FDA and  has been authorized for detection and/or diagnosis of SARS-CoV-2 by FDA under an Emergency Use Authorization (EUA). This EUA will remain  in effect (meaning this test can be used) for the duration of the COVID-19 declaration under Section 564(b)(1) of the Act, 21 U.S.C.section 360bbb-3(b)(1), unless the authorization is terminated  or revoked sooner.       Influenza A by PCR NEGATIVE NEGATIVE Final   Influenza B by PCR NEGATIVE NEGATIVE Final    Comment: (NOTE) The Xpert Xpress SARS-CoV-2/FLU/RSV plus assay is intended as an aid in the diagnosis of influenza from Nasopharyngeal swab specimens and should not be used as a sole basis for treatment. Nasal washings and aspirates are unacceptable for Xpert Xpress SARS-CoV-2/FLU/RSV testing.  Fact Sheet for Patients: BloggerCourse.com  Fact Sheet for Healthcare Providers: SeriousBroker.it  This test is not yet approved or cleared by the [place]  FDA and has been authorized for detection and/or diagnosis of SARS-CoV-2 by FDA under an Emergency Use Authorization (EUA). This EUA will remain in effect (meaning this test can  be used) for the duration of the COVID-19 declaration under Section 564(b)(1) of the Act, 21 U.S.C. section 360bbb-3(b)(1), unless the authorization is terminated or revoked.     Resp Syncytial Virus by PCR NEGATIVE NEGATIVE Final    Comment: (NOTE) Fact Sheet for Patients: BloggerCourse.com  Fact Sheet for Healthcare Providers: SeriousBroker.it  This test is not yet approved or cleared by the [place]  FDA and has been authorized for detection and/or diagnosis of SARS-CoV-2 by FDA under an Emergency Use Authorization (EUA). This EUA will remain in effect (meaning this test can be used) for the duration of the COVID-19 declaration under Section 564(b)(1) of the Act, 21 U.S.C. section 360bbb-3(b)(1), unless the authorization is terminated or revoked.  Performed at [facility], [address], [place], [place] [address]   Blood culture (routine x 2)     Status: None (Preliminary result)   Collection Time: 10/25/23  1:03 AM   Specimen: BLOOD LEFT ARM  Result Value Ref Range Status   Specimen Description   Final    BLOOD LEFT ARM Performed at [facility], [address], [place], [place] [address]    Special Requests   Final    BOTTLES DRAWN AEROBIC AND ANAEROBIC Blood Culture results may not be optimal due to an inadequate volume of blood received in culture bottles Performed at [facility], [address] [place], [place] [address]    Culture  Final    NO GROWTH 1 DAY Performed at [facility], [address], [place], [place] [address]    Report Status PENDING  Incomplete  MRSA Next Gen by PCR, Nasal     Status: None   Collection Time: 10/25/23  1:56 AM   Specimen: Nasal Mucosa; Nasal Swab  Result Value Ref Range Status   MRSA by PCR Next Gen NOT DETECTED NOT DETECTED Final    Comment: (NOTE) The GeneXpert MRSA Assay (FDA approved for NASAL specimens only), is  one component of a comprehensive MRSA colonization surveillance program. It is not intended to diagnose MRSA infection nor to guide or monitor treatment for MRSA infections. Test performance is not FDA approved in patients less than 74 years old. Performed at [facility], [address], [place], [place] [address]   Expectorated Sputum Assessment w Gram Stain, Rflx to Resp Cult     Status: None   Collection Time: 10/25/23  2:31 AM   Specimen: Sputum  Result Value Ref Range Status   Specimen Description SPUTUM  Final   Special Requests NONE  Final   Sputum evaluation   Final    THIS SPECIMEN IS ACCEPTABLE FOR SPUTUM CULTURE Performed at [facility], [address], [place], [place] [address]    Report Status 10/25/2023 FINAL  Final  Culture, Respiratory w Gram Stain     Status: None (Preliminary result)   Collection Time: 10/25/23  2:31 AM   Specimen: SPU  Result Value Ref Range Status   Specimen Description   Final    SPUTUM Performed at [facility], [address] [place], [place] [address]    Special Requests   Final    NONE Reflexed from [id number] Performed at [facility], [address] [place], [place] [address]    Gram Stain   Final    ABUNDANT SQUAMOUS EPITHELIAL CELLS PRESENT RARE WBC PRESENT, PREDOMINANTLY PMN RARE GRAM POSITIVE COCCI IN PAIRS RARE GRAM POSITIVE RODS    Culture   Final    CULTURE REINCUBATED FOR BETTER GROWTH Performed at [facility], [address], [place], [place] [address]    Report Status PENDING  Incomplete  Respiratory (~20 pathogens) panel by PCR     Status: None   Collection Time: 10/25/23  3:25 AM   Specimen: Nasopharyngeal Swab; Respiratory  Result Value Ref Range Status   Adenovirus NOT DETECTED NOT DETECTED Final   Coronavirus 229E NOT DETECTED NOT DETECTED Final    Comment: (NOTE) The Coronavirus on the Respiratory Panel, DOES NOT test for the  novel  Coronavirus (2019 nCoV)    Coronavirus HKU1 NOT DETECTED NOT DETECTED Final   Coronavirus NL63 NOT DETECTED NOT DETECTED Final   Coronavirus OC43 NOT DETECTED NOT DETECTED Final   Metapneumovirus NOT DETECTED NOT DETECTED Final   Rhinovirus / Enterovirus NOT DETECTED NOT DETECTED Final   Influenza A NOT DETECTED NOT DETECTED Final   Influenza B NOT DETECTED NOT DETECTED Final   Parainfluenza Virus 1 NOT DETECTED NOT DETECTED Final   Parainfluenza Virus 2 NOT DETECTED NOT DETECTED Final   Parainfluenza Virus 3 NOT DETECTED NOT DETECTED Final   Parainfluenza Virus 4 NOT DETECTED NOT DETECTED Final   Respiratory Syncytial Virus NOT DETECTED NOT DETECTED Final   Bordetella pertussis NOT DETECTED NOT DETECTED Final   Bordetella Parapertussis NOT DETECTED NOT DETECTED Final   Chlamydophila pneumoniae NOT DETECTED NOT DETECTED Final   Mycoplasma  pneumoniae NOT DETECTED NOT DETECTED Final    Comment: Performed at [facility], [address], [place], [place] [address]         Radiology Studies: DG Chest Portable 1 View Result Date: 10/24/2023 CLINICAL DATA:  Hypotension EXAM: PORTABLE CHEST 1 VIEW COMPARISON:  Chest radiograph dated 02/13/2023 FINDINGS: Postsurgical changes of the left apex. Normal lung volumes. Mild diffuse interstitial opacities. No pleural effusion or pneumothorax. Enlarged cardiomediastinal silhouette is likely projectional. No acute osseous abnormality. IMPRESSION: Mild diffuse interstitial opacities, which may represent mild pulmonary edema. Electronically Signed   By: Limin  Xu M.D.   On: 10/24/2023 16:32        Scheduled Meds:  albuterol   2.5 mg Nebulization TID   [START ON 10/27/2023] amoxicillin -clavulanate  1 tablet Oral Q12H   azithromycin   500 mg Oral QHS   budesonide -glycopyrrolate -formoterol   2 puff Inhalation BID   Chlorhexidine  Gluconate Cloth  6 each Topical Daily   cyanocobalamin   1,000 mcg Subcutaneous Daily   diltiazem   120 mg Oral  Daily   enoxaparin  (LOVENOX ) injection  40 mg Subcutaneous Q24H   fluticasone   2 spray Each Nare Daily   loratadine   10 mg Oral Daily   montelukast   10 mg Oral QHS   pantoprazole   40 mg Oral Daily   phosphorus  250 mg Oral BID   rosuvastatin   40 mg Oral Daily   sodium chloride  flush  3 mL Intravenous Q12H   Continuous Infusions:     LOS: 1 day    Time spent: 40 mins    Toribio Hummer, MD [facility]   To contact the attending provider between 7A-7P or the covering provider during after hours 7P-7A, please log into the web site [email or web address] and access using universal [place] password for that web site. If you do not have the password, please call the hospital operator.  10/26/2023, 3:48 PM

## 2023-10-26 NOTE — Plan of Care (Signed)
 Patient awaiting on a telemetry bed to become available, patient for possible discharge tomorrow, plan of care and goals discussed with patient, time given for questions, patient handbook/guide at bedside, patient sitting up in chair throughout the day, patient ambulates independently.   Problem: Education: Goal: Knowledge of General Education information will improve Description: Including pain rating scale, medication(s)/side effects and non-pharmacologic comfort measures Outcome: Progressing   Problem: Health Behavior/Discharge Planning: Goal: Ability to manage health-related needs will improve Outcome: Progressing   Problem: Clinical Measurements: Goal: Ability to maintain clinical measurements within normal limits will improve Outcome: Progressing Goal: Will remain free from infection Outcome: Progressing Goal: Diagnostic test results will improve Outcome: Progressing Goal: Respiratory complications will improve Outcome: Progressing Goal: Cardiovascular complication will be avoided Outcome: Progressing   Problem: Activity: Goal: Risk for activity intolerance will decrease Outcome: Progressing   Problem: Nutrition: Goal: Adequate nutrition will be maintained Outcome: Progressing   Problem: Coping: Goal: Level of anxiety will decrease Outcome: Progressing   Problem: Elimination: Goal: Will not experience complications related to bowel motility Outcome: Progressing Goal: Will not experience complications related to urinary retention Outcome: Progressing   Problem: Pain Managment: Goal: General experience of comfort will improve and/or be controlled Outcome: Progressing   Problem: Safety: Goal: Ability to remain free from injury will improve Outcome: Progressing   Problem: Skin Integrity: Goal: Risk for impaired skin integrity will decrease Outcome: Progressing

## 2023-10-26 NOTE — Plan of Care (Signed)
  Problem: Health Behavior/Discharge Planning: Goal: Ability to manage health-related needs will improve Outcome: Progressing   Problem: Clinical Measurements: Goal: Ability to maintain clinical measurements within normal limits will improve Outcome: Progressing Goal: Will remain free from infection Outcome: Progressing Goal: Diagnostic test results will improve Outcome: Progressing Goal: Respiratory complications will improve Outcome: Progressing Goal: Cardiovascular complication will be avoided Outcome: Progressing   Problem: Nutrition: Goal: Adequate nutrition will be maintained Outcome: Progressing   Problem: Elimination: Goal: Will not experience complications related to bowel motility Outcome: Progressing Goal: Will not experience complications related to urinary retention Outcome: Progressing

## 2023-10-26 NOTE — Progress Notes (Signed)
   10/26/23 1024  TOC Brief Assessment  Insurance and Status Reviewed  Patient has primary care physician Yes T[place], Pat, NP)  Home environment has been reviewed Single family home with spouse  Prior level of function: Independent with ADL's  Prior/Current Home Services No current home services  Social Drivers of Health Review SDOH reviewed no interventions necessary  Readmission risk has been reviewed Yes  Transition of care needs no transition of care needs at this time

## 2023-10-27 ENCOUNTER — Inpatient Hospital Stay (HOSPITAL_COMMUNITY)

## 2023-10-27 DIAGNOSIS — I4729 Other ventricular tachycardia: Secondary | ICD-10-CM

## 2023-10-27 DIAGNOSIS — I9589 Other hypotension: Secondary | ICD-10-CM

## 2023-10-27 DIAGNOSIS — N400 Enlarged prostate without lower urinary tract symptoms: Secondary | ICD-10-CM | POA: Diagnosis not present

## 2023-10-27 DIAGNOSIS — E861 Hypovolemia: Secondary | ICD-10-CM | POA: Diagnosis not present

## 2023-10-27 LAB — CULTURE, RESPIRATORY W GRAM STAIN: Culture: NORMAL

## 2023-10-27 LAB — ECHOCARDIOGRAM COMPLETE
AR max vel: 3.09 cm2
AV Area VTI: 3.31 cm2
AV Area mean vel: 3.05 cm2
AV Mean grad: 5 mmHg
AV Peak grad: 8.8 mmHg
Ao pk vel: 1.48 m/s
Area-P 1/2: 4.46 cm2
Calc EF: 66.2 %
Height: 69 in
S' Lateral: 2.9 cm
Single Plane A2C EF: 65.3 %
Single Plane A4C EF: 65.6 %
Weight: 2490.32 [oz_av]

## 2023-10-27 LAB — BASIC METABOLIC PANEL WITH GFR
Anion gap: 7 (ref 5–15)
BUN: 7 mg/dL — ABNORMAL LOW (ref 8–23)
CO2: 27 mmol/L (ref 22–32)
Calcium: 9.3 mg/dL (ref 8.9–10.3)
Chloride: 106 mmol/L (ref 98–111)
Creatinine, Ser: 0.8 mg/dL (ref 0.61–1.24)
GFR, Estimated: >60 mL/min (ref >60)
Glucose, Bld: 119 mg/dL — ABNORMAL HIGH (ref 70–99)
Potassium: 4 mmol/L (ref 3.5–5.1)
Sodium: 140 mmol/L (ref 135–145)

## 2023-10-27 LAB — CBC
HCT: 34.2 % — ABNORMAL LOW (ref 39.0–52.0)
Hemoglobin: 10.5 g/dL — ABNORMAL LOW (ref 13.0–17.0)
MCH: 28.5 pg (ref 26.0–34.0)
MCHC: 30.7 g/dL (ref 30.0–36.0)
MCV: 92.9 fL (ref 80.0–100.0)
Platelets: 307 K/uL (ref 150–400)
RBC: 3.68 MIL/uL — ABNORMAL LOW (ref 4.22–5.81)
RDW: 14.8 % (ref 11.5–15.5)
WBC: 8.6 K/uL (ref 4.0–10.5)
nRBC: 0 % (ref 0.0–0.2)

## 2023-10-27 LAB — MAGNESIUM: Magnesium: 1.9 mg/dL (ref 1.7–2.4)

## 2023-10-27 MED ORDER — VITAMIN B-12 1000 MCG PO TABS
1000.0000 ug | ORAL_TABLET | Freq: Every day | ORAL | Status: AC
Start: 2023-10-27 — End: ?

## 2023-10-27 NOTE — Plan of Care (Addendum)
 Patient educated on discharge instructions. Patient states he understands all discharge instructions. Patient PIV removed. Patients belongings given to patient prior to discharge. Patient walked off of unit with wife.  Problem: Education: Goal: Knowledge of General Education information will improve Description: Including pain rating scale, medication(s)/side effects and non-pharmacologic comfort measures 10/27/2023 1115 by Terree Gaultney, Harlene DEL, RN Outcome: Completed/Met 10/27/2023 1101 by Cabria Micalizzi, Harlene DEL, RN Outcome: Adequate for Discharge   Problem: Health Behavior/Discharge Planning: Goal: Ability to manage health-related needs will improve 10/27/2023 1115 by Samuele Storey, Harlene DEL, RN Outcome: Completed/Met 10/27/2023 1101 by Philippa Harlene DEL, RN Outcome: Adequate for Discharge   Problem: Clinical Measurements: Goal: Ability to maintain clinical measurements within normal limits will improve 10/27/2023 1115 by Anniya Whiters, Harlene DEL, RN Outcome: Completed/Met 10/27/2023 1101 by Philippa Harlene DEL, RN Outcome: Adequate for Discharge Goal: Will remain free from infection 10/27/2023 1115 by Tanyla Stege, Harlene DEL, RN Outcome: Completed/Met 10/27/2023 1101 by Philippa Harlene DEL, RN Outcome: Adequate for Discharge Goal: Diagnostic test results will improve 10/27/2023 1115 by Geramy Lamorte, Harlene DEL, RN Outcome: Completed/Met 10/27/2023 1101 by Philippa Harlene DEL, RN Outcome: Adequate for Discharge Goal: Respiratory complications will improve 10/27/2023 1115 by Yahayra Geis, Harlene DEL, RN Outcome: Completed/Met 10/27/2023 1101 by Philippa Harlene DEL, RN Outcome: Adequate for Discharge Goal: Cardiovascular complication will be avoided 10/27/2023 1115 by Vennie Salsbury, Harlene DEL, RN Outcome: Completed/Met 10/27/2023 1101 by Philippa Harlene DEL, RN Outcome: Adequate for Discharge   Problem: Activity: Goal: Risk for activity intolerance will decrease 10/27/2023 1115 by Kynslie Ringle, Harlene DEL, RN Outcome: Completed/Met 10/27/2023 1101 by Philippa Harlene DEL,  RN Outcome: Adequate for Discharge   Problem: Nutrition: Goal: Adequate nutrition will be maintained 10/27/2023 1115 by Yuvonne Lanahan, Harlene DEL, RN Outcome: Completed/Met 10/27/2023 1101 by Philippa Harlene DEL, RN Outcome: Adequate for Discharge   Problem: Coping: Goal: Level of anxiety will decrease 10/27/2023 1115 by Saran Laviolette, Harlene DEL, RN Outcome: Completed/Met 10/27/2023 1101 by Philippa Harlene DEL, RN Outcome: Adequate for Discharge   Problem: Elimination: Goal: Will not experience complications related to bowel motility 10/27/2023 1115 by Dallyn Bergland, Harlene DEL, RN Outcome: Completed/Met 10/27/2023 1101 by Philippa Harlene DEL, RN Outcome: Adequate for Discharge Goal: Will not experience complications related to urinary retention 10/27/2023 1115 by Devlin Brink, Harlene DEL, RN Outcome: Completed/Met 10/27/2023 1101 by Philippa Harlene DEL, RN Outcome: Adequate for Discharge   Problem: Pain Managment: Goal: General experience of comfort will improve and/or be controlled 10/27/2023 1115 by Radiance Deady, Harlene DEL, RN Outcome: Completed/Met 10/27/2023 1101 by Philippa Harlene DEL, RN Outcome: Adequate for Discharge   Problem: Safety: Goal: Ability to remain free from injury will improve 10/27/2023 1115 by Keshana Klemz, Harlene DEL, RN Outcome: Completed/Met 10/27/2023 1101 by Philippa Harlene DEL, RN Outcome: Adequate for Discharge   Problem: Skin Integrity: Goal: Risk for impaired skin integrity will decrease 10/27/2023 1115 by Cloud Graham, Harlene DEL, RN Outcome: Completed/Met 10/27/2023 1101 by Philippa Harlene DEL, RN Outcome: Adequate for Discharge

## 2023-10-27 NOTE — Discharge Summary (Signed)
 Physician Discharge Summary  Jesus Bush FMW:[id number] DOB: Aug 08, 1945 DOA: 10/24/2023  PCP: [name], NP  Admit date: 10/24/2023 Discharge date: 10/27/2023  Time spent: 60 minutes  Recommendations for Outpatient Follow-up:  Follow-up with [name], NP in 1 to 2 weeks.  On follow-up patient will need a basic metabolic profile, phosphorus level, magnesium  level done to follow-up on electrolytes and renal function.  Patient will need repeat thyroid  function studies done in about 4 weeks for follow-up on abnormal TSH.  Patient's vitamin B12 deficiency will need to be followed up upon.  Patient's alfuzosin  was held on discharge until follow-up with PCP to determine whether he may be resumed as patient had presented with hypotension. Follow-up with Dr. Lavona, cardiology in 2 weeks.   Discharge Diagnoses:  Principal Problem:   Hypotension Active Problems:   Essential hypertension   Paroxysmal A-fib (HCC)   CAP (community acquired pneumonia)   Hypovolemia   Medication adverse effect   Hypomagnesemia   Hypophosphatemia   Benign prostatic hyperplasia   NSVT (nonsustained ventricular tachycardia) (HCC)   Discharge Condition: Stable and improved.  Diet recommendation: Heart healthy  Filed Weights   10/25/23 0031  Weight: 70.6 kg    History of present illness:  HPI per Dr. Keturah Renelda LITTIE Blush is a 78 y.o. male with hx of COPD, paroxysmal A-fib, hypertension, DM type 2, BPH, who is transferred from [facility] ED for hypotension.  He reports that he had been feeling well until yesterday he was outside mowing his grass for about 3 hours.  Began to feel lightheaded and presyncopal.  Denies any syncope or fall.  Went inside and wife checked his blood pressure and says it was low in the 60s systolic.  Notes that he did start a new medication for BPH 2 days prior and has been taking all his other antihypertensives as prescribed.  Otherwise does acknowledge a recent cough minimally  productive and minimal URI symptoms.  No fevers or chills.  Denies any other recent illness.  No chest pain, palpitations with his presyncopal event   Hospital Course:  #1 hypotension, likely hypovolemic in the setting of antihypertensive medications -Patient presented with hypotension, with symptoms of lightheadedness, presyncope after being outside mowing the lawn for about 3 hours in intensity and BP checked by wife at that time with systolics in the 60s. - Patient also noted to be taking other antihypertensive medications and started a new medication for BPH 2 days prior to admission. - Status post 3 L IV fluid resuscitation with improvement with hypotension. - Urinalysis done with trace leukocyte nitrite negative, 0-5 WBCs.  Patient with no urinary symptoms. - Chest x-ray with mild diffuse interstitial opacities which may represent mild pulmonary edema however patient with a productive cough of whitish sputum and on antibiotics for possible CAP. - Alfuzosin  and patient's antihypertensive medications were held during the hospitalization.   - Patient hydrated aggressively with IV fluids, BP responded to fluid resuscitation and hypotension had resolved by day of discharge.   - Patient will discharge off antihypertensive medications until follow-up with PCP.   - Patient be discharged in stable and improved condition.    2.  AKI -Patient noted on admission to have a creatinine of 1.59 with baseline creatinine of 0.8. - Likely secondary to a prerenal azotemia. -Improved and resolved with hydration. - Outpatient follow-up.   3.??  CAP -Patient with reports of mild cough, URI symptoms. - Chest x-ray with patchy airspace opacities. - Patient not volume overloaded  on examination. - BNP within normal limits. - Blood cultures pending with no growth to date. - Sputum Gram stain and cultures pending. - Respiratory viral panel negative. - SARS coronavirus 2 PCR negative, influenza A and B by PCR  negative.  RSV by PCR negative. - Patient was maintained on IV Rocephin  and azithromycin  during the hospitalization and will be transition to Augmentin  for 5 more days on discharge to complete a 7-day course of antibiotic treatment.   - Patient also maintained on Claritin , PPI, Flonase  during the hospitalization.   - Patient will be discharged in stable and improved condition.     4.  Hypomagnesemia/hypophosphatemia - Repleted.  - Outpatient follow-up with PCP   5.  COPD -Stable. - Patient maintained on Trelegy Ellipta , nebs as needed.   6.  Paroxysmal A-fib - Remained rate controlled on Cardizem  during the hospitalization.  - Outpatient follow-up with PCP and primary cardiologist.     7.  Hypertension -Patient noted to present to the hospital with hypotension. - Patient resumed on decreased dose of home regimen diltiazem  initially during the hospitalization which was further increased back to home regimen of Cardizem  CD 240 mg daily. - Patient is at the antihypertensive medications were held and will not be resumed on discharge until follow-up with PCP.   8.  Diabetes mellitus type 2 -Hemoglobin A1c 7.0.  - Jardiance  was held during the hospitalization and will be resumed 4 to 5 days postdischarge.   - Patient was maintained on sliding scale insulin  during the hospitalization.   9.  BPH - Alfuzosin  was held during the hospitalization and will not be resumed until follow-up with PCP.   - May need to be placed on Flomax, however will defer to PCP.   10.  10 beat run of NSVT -Per RN patient with a 10 beat run of NSVT and remained asymptomatic. -Likely secondary to decreased dose of Cardizem  CD on admission. - Patient with no chest pain. - Electrolytes repleted. - TSH noted at 0.330. - Free T4 at 1.12  - 2D echo with EF of 65 to 70%, and WMA, grade 1 DD.   - Patient's Cardizem  CD which was initially decreased to half his home dose was increased back to his home dose of 240 mg  daily.   - Patient had no further runs of NSVT.   - Outpatient follow-up with primary cardiologist.    11.  Abnormal TSH -TSH noted at 0.330. - Free T4 noted within normal limits at 1.12.   - Outpatient follow-up with PCP.  12.  Vitamin B12 deficiency -B12 levels noted at 145. -Patient started on vitamin B12 1000 mcg subcutaneously during the hospitalization and will be discharged on oral vitamin B12 on discharge. -Outpatient follow-up with PCP.      Procedures: Chest x-ray 10/24/2023  Consultations: None  Discharge Exam: Vitals:   10/27/23 0900 10/27/23 0954  BP: (!) 127/58 (!) 125/53  Pulse: 96   Resp: (!) 24   Temp:    SpO2: 97%     General: NAD Cardiovascular: RRR no murmurs rubs or gallops.  No JVD.  No lower extremity edema. Respiratory: Clear to auscultation bilaterally.  No wheezes, no crackles, no rhonchi.  Fair air movement.  Speaking in full sentences.  Discharge Instructions   Discharge Instructions     Diet - low sodium heart healthy   Complete by: As directed    Increase activity slowly   Complete by: As directed  Allergies as of 10/27/2023       Reactions   Ppd [tuberculin Purified Protein Derivative]    Positive PPD.        Medication List     PAUSE taking these medications    alfuzosin  10 MG 24 hr tablet Wait to take this until your doctor or other care provider tells you to start again. Commonly known as: UROXATRAL  Take 1 tablet (10 mg total) by mouth daily with breakfast.   Jardiance  10 MG Tabs tablet Wait to take this until: November 01, 2023 Generic drug: empagliflozin  Take 1 tablet (10 mg total) by mouth daily before breakfast.   losartan  100 MG tablet Wait to take this until your doctor or other care provider tells you to start again. Commonly known as: COZAAR  Take 1 tablet (100 mg total) by mouth daily for blood pressure.       TAKE these medications    acetaminophen  500 MG tablet Commonly known as:  TYLENOL  Take 1,000 mg by mouth at bedtime.   albuterol  108 (90 Base) MCG/ACT inhaler Commonly known as: VENTOLIN  HFA Inhale 2 puffs into the lungs every 6 (six) hours as needed for wheezing or shortness of breath.   amoxicillin -clavulanate 875-125 MG tablet Commonly known as: AUGMENTIN  Take 1 tablet by mouth every 12 (twelve) hours for 5 days.   cetirizine  10 MG tablet Commonly known as: ZyrTEC  Allergy Take one tablet nightly for allergies. What changed:  how much to take how to take this when to take this additional instructions   cyanocobalamin  1000 MCG tablet Commonly known as: VITAMIN B12 Take 1 tablet (1,000 mcg total) by mouth daily.   diltiazem  240 MG 24 hr capsule Commonly known as: DILACOR XR  Take 1 capsule (240 mg total) by mouth daily FOR BLOOD PRESSURE AND HEART RHYTHM   gabapentin  300 MG capsule Commonly known as: Neurontin  Take 1 capsule (300 mg total) by mouth at 1-2 hours before bedtime as needed. What changed: reasons to take this   magnesium  gluconate 500 MG tablet Commonly known as: MAGONATE Take 500 mg by mouth daily.   montelukast  10 MG tablet Commonly known as: SINGULAIR  Take 1 tablet (10 mg total) by mouth daily for allergies.   ONE TOUCH ULTRA 2 w/Device Kit Use as directed to check blood sugars three times daily E11.69   rosuvastatin  40 MG tablet Commonly known as: CRESTOR  Take 1 tablet (40 mg total) by mouth daily for cholesterol.   Trelegy Ellipta  200-62.5-25 MCG/ACT Aepb Generic drug: Fluticasone -Umeclidin-Vilant Inhale 1 puff into the lungs daily for COPD /Asthma   True Comfort Safety Lancets Misc Use as directed to check blood sugars E11.65   OneTouch Delica Plus Lancet33G Misc Use as instructed to check blood sugars three times daily E11.69   True Metrix Blood Glucose Test test strip Generic drug: glucose blood Use as instructed to check blood sugars   OneTouch Ultra Test test strip Generic drug: glucose blood Use as  instructed to check blood sugars 3 times daily E11.69   VITAMIN D  PO Take 5,000 Units by mouth daily.   Voquezna  10 MG Tabs Generic drug: Vonoprazan Fumarate  Take 1 tablet by mouth daily.       Allergies  Allergen Reactions   Ppd [Tuberculin Purified Protein Derivative]     Positive PPD.    Follow-up Information     [name], NP. Schedule an appointment as soon as possible for a visit in 1 week(s).   Specialty: Family Medicine Why: Follow-up in 1 to  2 weeks. Contact information: [address] [place] [address] [phone]         Lavona Agent, MD. Schedule an appointment as soon as possible for a visit in 2 week(s).   Specialty: Cardiology Contact information: [address] [place] [address] [phone]                  The results of significant diagnostics from this hospitalization (including imaging, microbiology, ancillary and laboratory) are listed below for reference.    Significant Diagnostic Studies: ECHOCARDIOGRAM COMPLETE Result Date: 10/27/2023    ECHOCARDIOGRAM REPORT   Patient Name:   Jesus Bush Date of Exam: 10/27/2023 Medical Rec #:  [id number]       Height:       69.0 in Accession #:    [id number]      Weight:       155.6 lb Date of Birth:  July 04, 1945      BSA:          1.857 m Patient Age:    42 years        BP:           109/44 mmHg Patient Gender: M               HR:           71 bpm. Exam Location:  Inpatient Procedure: 2D Echo, Cardiac Doppler, Color Doppler and Strain Analysis (Both            Spectral and Color Flow Doppler were utilized during procedure). Indications:    Vtach  History:        Patient has prior history of Echocardiogram examinations, most                 recent 08/26/2022. COPD, Arrythmias:Atrial Fibrillation; Risk                 Factors:Hypertension, Former Smoker and Diabetes.  Sonographer:    Therisa Crouch Referring Phys: [id number] Meliya Mcconahy V Chabely Norby IMPRESSIONS  1. Left ventricular ejection  fraction, by estimation, is 65 to 70%. The left ventricle has normal function. The left ventricle has no regional wall motion abnormalities. Left ventricular diastolic parameters are consistent with Grade I diastolic dysfunction (impaired relaxation). The average left ventricular global longitudinal strain is -21.2 %. The global longitudinal strain is normal.  2. Right ventricular systolic function is normal. The right ventricular size is normal.  3. The mitral valve is normal in structure. No evidence of mitral valve regurgitation. No evidence of mitral stenosis.  4. The aortic valve is tricuspid. There is moderate calcification of the aortic valve. There is moderate thickening of the aortic valve. Aortic valve regurgitation is not visualized. Aortic valve sclerosis/calcification is present, without any evidence of aortic stenosis.  5. The inferior vena cava is normal in size with greater than 50% respiratory variability, suggesting right atrial pressure of 3 mmHg. FINDINGS  Left Ventricle: Left ventricular ejection fraction, by estimation, is 65 to 70%. The left ventricle has normal function. The left ventricle has no regional wall motion abnormalities. The average left ventricular global longitudinal strain is -21.2 %. Strain was performed and the global longitudinal strain is normal. The left ventricular internal cavity size was normal in size. There is no left ventricular hypertrophy. Left ventricular diastolic parameters are consistent with Grade I diastolic dysfunction (impaired relaxation). Right Ventricle: The right ventricular size is normal. No increase in right ventricular wall thickness. Right ventricular systolic function is  normal. Left Atrium: Left atrial size was normal in size. Right Atrium: Right atrial size was normal in size. Pericardium: There is no evidence of pericardial effusion. Mitral Valve: The mitral valve is normal in structure. No evidence of mitral valve regurgitation. No evidence of  mitral valve stenosis. Tricuspid Valve: The tricuspid valve is normal in structure. Tricuspid valve regurgitation is trivial. No evidence of tricuspid stenosis. Aortic Valve: The aortic valve is tricuspid. There is moderate calcification of the aortic valve. There is moderate thickening of the aortic valve. Aortic valve regurgitation is not visualized. Aortic valve sclerosis/calcification is present, without any  evidence of aortic stenosis. Aortic valve mean gradient measures 5.0 mmHg. Aortic valve peak gradient measures 8.8 mmHg. Aortic valve area, by VTI measures 3.31 cm. Pulmonic Valve: The pulmonic valve was normal in structure. Pulmonic valve regurgitation is not visualized. No evidence of pulmonic stenosis. Aorta: The aortic root is normal in size and structure. Venous: The inferior vena cava is normal in size with greater than 50% respiratory variability, suggesting right atrial pressure of 3 mmHg. IAS/Shunts: No atrial level shunt detected by color flow Doppler. Additional Comments: 3D was performed not requiring image post processing on an independent workstation and was indeterminate.  LEFT VENTRICLE PLAX 2D LVIDd:         4.40 cm     Diastology LVIDs:         2.90 cm     LV e' medial:    5.55 cm/s LV PW:         0.80 cm     LV E/e' medial:  9.9 LV IVS:        0.80 cm     LV e' lateral:   9.14 cm/s LVOT diam:     2.10 cm     LV E/e' lateral: 6.0 LV SV:         96 LV SV Index:   51          2D Longitudinal Strain LVOT Area:     3.46 cm    2D Strain GLS Avg:     -21.2 %  LV Volumes (MOD) LV vol d, MOD A2C: 83.4 ml LV vol d, MOD A4C: 76.8 ml LV vol s, MOD A2C: 28.9 ml LV vol s, MOD A4C: 26.4 ml LV SV MOD A2C:     54.5 ml LV SV MOD A4C:     76.8 ml LV SV MOD BP:      53.9 ml RIGHT VENTRICLE RV Basal diam:  3.30 cm RV S prime:     17.30 cm/s TAPSE (M-mode): 2.8 cm LEFT ATRIUM             Index        RIGHT ATRIUM           Index LA diam:        3.50 cm 1.88 cm/m   RA Area:     17.70 cm LA Vol (A2C):   67.3  ml 36.24 ml/m  RA Volume:   44.90 ml  24.18 ml/m LA Vol (A4C):   39.0 ml 21.00 ml/m LA Biplane Vol: 53.5 ml 28.81 ml/m  AORTIC VALVE AV Area (Vmax):    3.09 cm AV Area (Vmean):   3.05 cm AV Area (VTI):     3.31 cm AV Vmax:           148.00 cm/s AV Vmean:          103.000 cm/s AV VTI:  0.289 m AV Peak Grad:      8.8 mmHg AV Mean Grad:      5.0 mmHg LVOT Vmax:         132.00 cm/s LVOT Vmean:        90.600 cm/s LVOT VTI:          0.276 m LVOT/AV VTI ratio: 0.96  AORTA Ao Root diam: 2.90 cm Ao Asc diam:  3.10 cm MITRAL VALVE               TRICUSPID VALVE MV Area (PHT): 4.46 cm    TR Peak grad:   27.5 mmHg MV Decel Time: 170 msec    TR Vmax:        262.00 cm/s MV E velocity: 54.80 cm/s MV A velocity: 73.70 cm/s  SHUNTS MV E/A ratio:  0.74        Systemic VTI:  0.28 m                            Systemic Diam: 2.10 cm Maude Emmer MD Electronically signed by Maude Emmer MD Signature Date/Time: 10/27/2023/10:23:53 AM    Final    DG Chest Portable 1 View Result Date: 10/24/2023 CLINICAL DATA:  Hypotension EXAM: PORTABLE CHEST 1 VIEW COMPARISON:  Chest radiograph dated 02/13/2023 FINDINGS: Postsurgical changes of the left apex. Normal lung volumes. Mild diffuse interstitial opacities. No pleural effusion or pneumothorax. Enlarged cardiomediastinal silhouette is likely projectional. No acute osseous abnormality. IMPRESSION: Mild diffuse interstitial opacities, which may represent mild pulmonary edema. Electronically Signed   By: Limin  Xu M.D.   On: 10/24/2023 16:32    Microbiology: Recent Results (from the past 240 hours)  Microscopic Examination     Status: None   Collection Time: 10/21/23 12:00 AM   Urine  Result Value Ref Range Status   WBC, UA 0-5 0 - 5 /hpf Final   RBC, Urine 0-2 0 - 2 /hpf Final   Epithelial Cells (non renal) 0-10 0 - 10 /hpf Final   Bacteria, UA None seen None seen/Few Final  Blood culture (routine x 2)     Status: None (Preliminary result)   Collection Time: 10/24/23   4:29 PM   Specimen: BLOOD  Result Value Ref Range Status   Specimen Description   Final    BLOOD BLOOD LEFT WRIST Performed at [facility], [address], [place], [place] [address]    Special Requests   Final    BOTTLES DRAWN AEROBIC AND ANAEROBIC Blood Culture adequate volume Performed at [facility], [address], [place], [place] [address]    Culture   Final    NO GROWTH 2 DAYS Performed at [facility], [address], [place], [place] [address]    Report Status PENDING  Incomplete  Urine Culture     Status: Abnormal   Collection Time: 10/24/23  5:21 PM   Specimen: Urine, Clean Catch  Result Value Ref Range Status   Specimen Description   Final    URINE, CLEAN CATCH Performed at [facility], [address], [place], [place] [address]    Special Requests   Final    NONE Performed at [facility], [address], [place], [place] [address]    Culture (A)  Final    <10,000 COLONIES/mL INSIGNIFICANT GROWTH Performed at [facility], [address], [place], [place] [address]    Report Status 10/25/2023 FINAL  Final  Resp panel by RT-PCR (RSV, Flu A&B, Covid) Anterior Nasal Swab     Status: None   Collection Time: 10/24/23  6:50 PM   Specimen: Anterior Nasal Swab  Result Value Ref Range Status   SARS Coronavirus 2 by RT PCR NEGATIVE NEGATIVE Final    Comment: (NOTE) SARS-CoV-2 target nucleic acids are NOT DETECTED.  The SARS-CoV-2 RNA is generally detectable in upper respiratory specimens during the acute phase of infection. The lowest concentration of SARS-CoV-2 viral copies this assay can detect is 138 copies/mL. A negative result does not preclude SARS-Cov-2 infection and should not be used as the sole basis for treatment or other patient management decisions. A negative result may occur with  improper specimen collection/handling, submission of specimen other than  nasopharyngeal swab, presence of viral mutation(s) within the areas targeted by this assay, and inadequate number of viral copies(<138 copies/mL). A negative result must be combined with clinical observations, patient history, and epidemiological information. The expected result is Negative.  Fact Sheet for Patients:  BloggerCourse.com  Fact Sheet for Healthcare Providers:  SeriousBroker.it  This test is no t yet approved or cleared by the [place]  FDA and  has been authorized for detection and/or diagnosis of SARS-CoV-2 by FDA under an Emergency Use Authorization (EUA). This EUA will remain  in effect (meaning this test can be used) for the duration of the COVID-19 declaration under Section 564(b)(1) of the Act, 21 U.S.C.section 360bbb-3(b)(1), unless the authorization is terminated  or revoked sooner.       Influenza A by PCR NEGATIVE NEGATIVE Final   Influenza B by PCR NEGATIVE NEGATIVE Final    Comment: (NOTE) The Xpert Xpress SARS-CoV-2/FLU/RSV plus assay is intended as an aid in the diagnosis of influenza from Nasopharyngeal swab specimens and should not be used as a sole basis for treatment. Nasal washings and aspirates are unacceptable for Xpert Xpress SARS-CoV-2/FLU/RSV testing.  Fact Sheet for Patients: BloggerCourse.com  Fact Sheet for Healthcare Providers: SeriousBroker.it  This test is not yet approved or cleared by the [place]  FDA and has been authorized for detection and/or diagnosis of SARS-CoV-2 by FDA under an Emergency Use Authorization (EUA). This EUA will remain in effect (meaning this test can be used) for the duration of the COVID-19 declaration under Section 564(b)(1) of the Act, 21 U.S.C. section 360bbb-3(b)(1), unless the authorization is terminated or revoked.     Resp Syncytial Virus by PCR NEGATIVE NEGATIVE Final    Comment:  (NOTE) Fact Sheet for Patients: BloggerCourse.com  Fact Sheet for Healthcare Providers: SeriousBroker.it  This test is not yet approved or cleared by the [place]  FDA and has been authorized for detection and/or diagnosis of SARS-CoV-2 by FDA under an Emergency Use Authorization (EUA). This EUA will remain in effect (meaning this test can be used) for the duration of the COVID-19 declaration under Section 564(b)(1) of the Act, 21 U.S.C. section 360bbb-3(b)(1), unless the authorization is terminated or revoked.  Performed at [facility], [address], [place], [place] [address]   Blood culture (routine x 2)     Status: None (Preliminary result)   Collection Time: 10/25/23  1:03 AM   Specimen: BLOOD LEFT ARM  Result Value Ref Range Status   Specimen Description   Final    BLOOD LEFT ARM Performed at [facility], [address], [place], [place] [address]    Special Requests   Final    BOTTLES DRAWN AEROBIC AND ANAEROBIC Blood Culture results  may not be optimal due to an inadequate volume of blood received in culture bottles Performed at [facility], [address] [place], [place] [address]    Culture   Final    NO GROWTH 1 DAY Performed at [facility], [address] [place], [place] [address]    Report Status PENDING  Incomplete  MRSA Next Gen by PCR, Nasal     Status: None   Collection Time: 10/25/23  1:56 AM   Specimen: Nasal Mucosa; Nasal Swab  Result Value Ref Range Status   MRSA by PCR Next Gen NOT DETECTED NOT DETECTED Final    Comment: (NOTE) The GeneXpert MRSA Assay (FDA approved for NASAL specimens only), is one component of a comprehensive MRSA colonization surveillance program. It is not intended to diagnose MRSA infection nor to guide or monitor treatment for MRSA infections. Test performance is not FDA approved in patients less than 68  years old. Performed at [facility], [address], [place], [place] [address]   Expectorated Sputum Assessment w Gram Stain, Rflx to Resp Cult     Status: None   Collection Time: 10/25/23  2:31 AM   Specimen: Sputum  Result Value Ref Range Status   Specimen Description SPUTUM  Final   Special Requests NONE  Final   Sputum evaluation   Final    THIS SPECIMEN IS ACCEPTABLE FOR SPUTUM CULTURE Performed at [facility], [address] [place], [place] [address]    Report Status 10/25/2023 FINAL  Final  Culture, Respiratory w Gram Stain     Status: None (Preliminary result)   Collection Time: 10/25/23  2:31 AM   Specimen: SPU  Result Value Ref Range Status   Specimen Description   Final    SPUTUM Performed at [facility], [address] [place], [place] [address]    Special Requests   Final    NONE Reflexed from [id number] Performed at [facility], [address] [address], [place], [place] [address]    Gram Stain   Final    ABUNDANT SQUAMOUS EPITHELIAL CELLS PRESENT RARE WBC PRESENT, PREDOMINANTLY PMN RARE GRAM POSITIVE COCCI IN PAIRS RARE GRAM POSITIVE RODS    Culture   Final    CULTURE REINCUBATED FOR BETTER GROWTH Performed at [facility], [address], [place], [place] [address]    Report Status PENDING  Incomplete  Respiratory (~20 pathogens) panel by PCR     Status: None   Collection Time: 10/25/23  3:25 AM   Specimen: Nasopharyngeal Swab; Respiratory  Result Value Ref Range Status   Adenovirus NOT DETECTED NOT DETECTED Final   Coronavirus 229E NOT DETECTED NOT DETECTED Final    Comment: (NOTE) The Coronavirus on the Respiratory Panel, DOES NOT test for the novel  Coronavirus (2019 nCoV)    Coronavirus HKU1 NOT DETECTED NOT DETECTED Final   Coronavirus NL63 NOT DETECTED NOT DETECTED Final   Coronavirus OC43 NOT DETECTED NOT DETECTED Final   Metapneumovirus NOT DETECTED NOT DETECTED  Final   Rhinovirus / Enterovirus NOT DETECTED NOT DETECTED Final   Influenza A NOT DETECTED NOT DETECTED Final   Influenza B NOT DETECTED NOT DETECTED Final   Parainfluenza Virus 1 NOT DETECTED NOT DETECTED Final   Parainfluenza Virus 2 NOT DETECTED NOT DETECTED Final   Parainfluenza Virus 3 NOT DETECTED NOT DETECTED Final   Parainfluenza Virus 4 NOT DETECTED NOT DETECTED Final   Respiratory Syncytial Virus NOT  DETECTED NOT DETECTED Final   Bordetella pertussis NOT DETECTED NOT DETECTED Final   Bordetella Parapertussis NOT DETECTED NOT DETECTED Final   Chlamydophila pneumoniae NOT DETECTED NOT DETECTED Final   Mycoplasma pneumoniae NOT DETECTED NOT DETECTED Final    Comment: Performed at [facility], [address] [place], [place] [address]     Labs: Basic Metabolic Panel: Recent Labs  Lab 10/24/23 1620 10/24/23 1628 10/25/23 0337 10/26/23 0331 10/27/23 0228  NA 134* 135 139 140 140  K 3.7 3.6 3.7 3.6 4.0  CL 99  --  109 108 106  CO2 24  --  22 22 27   GLUCOSE 187*  --  103* 122* 119*  BUN 18  --  14 9 7*  CREATININE 1.59*  --  0.97 0.70 0.80  CALCIUM  9.8  --  9.0 8.7* 9.3  MG  --   --  1.6* 2.2 1.9  PHOS  --   --  2.4* 3.3  --    Liver Function Tests: Recent Labs  Lab 10/24/23 1620 10/26/23 0331  AST 15  --   ALT 12  --   ALKPHOS 87  --   BILITOT 0.3  --   PROT 7.1  --   ALBUMIN 4.2 3.0*   No results for input(s): LIPASE, AMYLASE in the last 168 hours. No results for input(s): AMMONIA in the last 168 hours. CBC: Recent Labs  Lab 10/24/23 1620 10/24/23 1628 10/25/23 0337 10/26/23 0331 10/27/23 0228  WBC 10.6*  --  8.6 9.1 8.6  NEUTROABS 8.6*  --   --  7.0  --   HGB 11.2* 11.9* 12.0* 10.4* 10.5*  HCT 35.2* 35.0* 37.5* 34.6* 34.2*  MCV 88.7  --  89.9 93.8 92.9  PLT 317  --  246 304 307   Cardiac Enzymes: No results for input(s): CKTOTAL, CKMB, CKMBINDEX, TROPONINI in the last 168 hours. BNP: BNP (last 3 results) No results for  input(s): BNP in the last 8760 hours.  ProBNP (last 3 results) Recent Labs    10/24/23 1850  PROBNP 73.2    CBG: No results for input(s): GLUCAP in the last 168 hours.     Signed:  Toribio Hummer MD.  [facility] 10/27/2023, 10:55 AM

## 2023-10-28 ENCOUNTER — Other Ambulatory Visit (HOSPITAL_COMMUNITY): Payer: Self-pay

## 2023-10-28 ENCOUNTER — Telehealth: Payer: Self-pay | Admitting: *Deleted

## 2023-10-28 NOTE — Transitions of Care (Post Inpatient/ED Visit) (Signed)
   10/28/2023  Name: Jesus Bush MRN: [id number] DOB: Jan 27, 1946  Today's TOC FU Call Status: Today's TOC FU Call Status:: Unsuccessful Call (1st Attempt) Unsuccessful Call (1st Attempt) Date: 10/28/23  Attempted to reach the patient regarding the most recent Inpatient/ED visit.  Follow Up Plan: Additional outreach attempts will be made to reach the patient to complete the Transitions of Care (Post Inpatient/ED visit) call.   Andrea Dimes RN, BSN [name]  [facility] RN Care Manager [phone]

## 2023-10-29 LAB — CULTURE, BLOOD (ROUTINE X 2)
Culture: NO GROWTH
Special Requests: ADEQUATE

## 2023-10-30 ENCOUNTER — Telehealth: Payer: Self-pay | Admitting: *Deleted

## 2023-10-30 LAB — CULTURE, BLOOD (ROUTINE X 2): Culture: NO GROWTH

## 2023-10-30 NOTE — Transitions of Care (Post Inpatient/ED Visit) (Signed)
 10/30/2023  Name: Jesus Bush MRN: [id number] DOB: 12/16/1945  Today's TOC FU Call Status: Today's TOC FU Call Status:: Successful TOC FU Call Completed TOC FU Call Complete Date: 10/30/23 Patient's Name and Date of Birth confirmed.  Transition Care Management Follow-up Telephone Call Date of Discharge: 10/27/23 Discharge Facility: [facility]) Type of Discharge: Inpatient Admission Primary Inpatient Discharge Diagnosis:: Hypotension How have you been since you were released from the hospital?: Better Any questions or concerns?: No  Items Reviewed: Did you receive and understand the discharge instructions provided?: Yes Medications obtained,verified, and reconciled?: Yes (Medications Reviewed) Any new allergies since your discharge?: No Dietary orders reviewed?: Yes Type of Diet Ordered:: Heart healthy Do you have support at home?: Yes People in Home [RPT]: spouse Name of Support/Comfort Primary Source: [name]  Medications Reviewed Today: Medications Reviewed Today     Reviewed by Lucky Andrea LABOR, RN (Registered Nurse) on 10/30/23 at 1638  Med List Status: <None>   Medication Order Taking? Sig Documenting Provider Last Dose Status Informant  acetaminophen  (TYLENOL ) 500 MG tablet [id number] Yes Take 1,000 mg by mouth at bedtime. [provider]  Active Spouse/Significant Other, Pharmacy Records, Multiple Informants, Self  albuterol  (VENTOLIN  HFA) 108 (90 Base) MCG/ACT inhaler [id number] Yes Inhale 2 puffs into the lungs every 6 (six) hours as needed for wheezing or shortness of breath. [name], MD  Active   alfuzosin  (UROXATRAL ) 10 MG 24 hr tablet [id number]  Take 1 tablet (10 mg total) by mouth daily with breakfast.  Patient not taking: Reported on 10/30/2023   [name], MD  Active Spouse/Significant Other, Pharmacy Records, Multiple Informants, Self           Med Note STEFFI, ADELITA   Fri Oct 25, 2023  9:26 AM) New med   amoxicillin -clavulanate (AUGMENTIN ) 875-125 MG tablet [id number] Yes Take 1 tablet by mouth every 12 (twelve) hours for 5 days. [name], MD  Active   Blood Glucose Monitoring Suppl (ONE TOUCH ULTRA 2) w/Device KIT [id number] Yes Use as directed to check blood sugars three times daily E11.69 [name], NP  Active Spouse/Significant Other, Pharmacy Records, Multiple Informants, Self  cetirizine  (ZYRTEC  ALLERGY) 10 MG tablet [id number] Yes Take one tablet nightly for allergies. [name], NP  Active Spouse/Significant Other, Pharmacy Records, Multiple Informants, Self  cyanocobalamin  (VITAMIN B12) 1000 MCG tablet [id number] Yes Take 1 tablet (1,000 mcg total) by mouth daily. [name], MD  Active   diltiazem  (DILACOR XR ) 240 MG 24 hr capsule [id number] Yes Take 1 capsule (240 mg total) by mouth daily FOR BLOOD PRESSURE AND HEART RHYTHM Wilkinson, Dana E, NP  Active Spouse/Significant Other, Pharmacy Records, Multiple Informants, Self  empagliflozin  (JARDIANCE ) 10 MG TABS tablet [id number]  Take 1 tablet (10 mg total) by mouth daily before breakfast.  Patient not taking: Reported on 10/30/2023   [name], NP  Active Spouse/Significant Other, Pharmacy Records, Multiple Informants, Self  Fluticasone -Umeclidin-Vilant (TRELEGY ELLIPTA ) 200-62.5-25 MCG/ACT AEPB [id number] Yes Inhale 1 puff into the lungs daily for COPD /Asthma Wilkinson, Dana E, NP  Active Spouse/Significant Other, Pharmacy Records, Multiple Informants, Self  gabapentin  (NEURONTIN ) 300 MG capsule [id number]  Take 1 capsule (300 mg total) by mouth at 1-2 hours before bedtime as needed.  Patient not taking: Reported on 10/30/2023   Wilkinson, Dana E, NP  Active Spouse/Significant Other, Pharmacy Records, Multiple Informants, Self    Discontinued 10/14/23 1317 (Discontinued by provider)  Med Note>> Janifer Crome M, CPhT   10/14/2023  1:17 PM Pt informed us  that MD dicontinued this med    glucose blood (ONETOUCH  ULTRA) test strip [id number] Yes Use as instructed to check blood sugars 3 times daily E11.69 [name], NP  Active Spouse/Significant Other, Pharmacy Records, Multiple Informants, Self  glucose blood (TRUE METRIX BLOOD GLUCOSE TEST) test strip [id number] Yes Use as instructed to check blood sugars [name], NP  Active Spouse/Significant Other, Pharmacy Records, Multiple Informants, Self  Lancets (ONETOUCH ULTRASOFT) lancets [id number] Yes Use as instructed to check blood sugars three times daily E11.69 [name], NP  Active Spouse/Significant Other, Pharmacy Records, Multiple Informants, Self  losartan  (COZAAR ) 100 MG tablet [id number]  Take 1 tablet (100 mg total) by mouth daily for blood pressure.  Patient not taking: Reported on 10/30/2023   Wilkinson, Dana E, NP  Active Spouse/Significant Other, Pharmacy Records, Multiple Informants, Self  magnesium  gluconate (MAGONATE) 500 MG tablet [id number] Yes Take 500 mg by mouth daily.  [provider]  Active Spouse/Significant Other, Pharmacy Records, Multiple Informants, Self  montelukast  (SINGULAIR ) 10 MG tablet [id number] Yes Take 1 tablet (10 mg total) by mouth daily for allergies. Wilkinson, Dana E, NP  Active Spouse/Significant Other, Pharmacy Records, Multiple Informants, Self  rosuvastatin  (CRESTOR ) 40 MG tablet [id number] Yes Take 1 tablet (40 mg total) by mouth daily for cholesterol. Wilkinson, Dana E, NP  Active Spouse/Significant Other, Pharmacy Records, Multiple Informants, Self  True Comfort Safety Lancets MISC [id number] Yes Use as directed to check blood sugars E11.65 [name], NP  Active Spouse/Significant Other, Pharmacy Records, Multiple Informants, Self  VITAMIN D  PO [id number] Yes Take 5,000 Units by mouth daily. [provider]  Active Spouse/Significant Other, Pharmacy Records, Multiple Informants, Self  Vonoprazan Fumarate  (VOQUEZNA ) 10 MG TABS [id number] Yes Take 1 tablet by mouth daily. [name], NP  Active  Spouse/Significant Other, Pharmacy Records, Multiple Informants, Self  Med List Note [name], CPhT 10/25/23 0929): Wife manages medications             Home Care and Equipment/Supplies: Were Home Health Services Ordered?: No Any new equipment or medical supplies ordered?: No  Functional Questionnaire: Do you need assistance with bathing/showering or dressing?: No Do you need assistance with meal preparation?: No Do you need assistance with eating?: No Do you have difficulty maintaining continence: No Do you need assistance with getting out of bed/getting out of a chair/moving?: No Do you have difficulty managing or taking your medications?: No  Follow up appointments reviewed: PCP Follow-up appointment confirmed?: Yes Date of PCP follow-up appointment?: 11/08/23 Follow-up Provider: Pries Petrina Specialist [facility] Follow-up appointment confirmed?: Yes Date of Specialist follow-up appointment?: 11/14/23 Follow-Up Specialty Provider:: Dr. Hochrein/Cardiology Do you need transportation to your follow-up appointment?: No Do you understand care options if your condition(s) worsen?: Yes-patient verbalized understanding  SDOH Interventions Today    Flowsheet Row Most Recent Value  SDOH Interventions   Food Insecurity Interventions Intervention Not Indicated  Housing Interventions Intervention Not Indicated  Transportation Interventions Intervention Not Indicated  Utilities Interventions Intervention Not Indicated    Andrea Dimes RN, BSN [place]  [facility] RN Care Manager [phone]

## 2023-11-06 ENCOUNTER — Inpatient Hospital Stay: Payer: Self-pay | Admitting: Family Medicine

## 2023-11-08 ENCOUNTER — Inpatient Hospital Stay: Payer: Self-pay | Admitting: Family Medicine

## 2023-11-09 ENCOUNTER — Other Ambulatory Visit (HOSPITAL_COMMUNITY): Payer: Self-pay

## 2023-11-13 ENCOUNTER — Ambulatory Visit (INDEPENDENT_AMBULATORY_CARE_PROVIDER_SITE_OTHER): Payer: Self-pay | Admitting: Family Medicine

## 2023-11-13 ENCOUNTER — Encounter: Payer: Self-pay | Admitting: Family Medicine

## 2023-11-13 VITALS — BP 114/60 | HR 74 | Temp 98.2°F | Ht 69.0 in | Wt 158.0 lb

## 2023-11-13 DIAGNOSIS — I700 Atherosclerosis of aorta: Secondary | ICD-10-CM | POA: Diagnosis not present

## 2023-11-13 DIAGNOSIS — I480 Paroxysmal atrial fibrillation: Secondary | ICD-10-CM

## 2023-11-13 DIAGNOSIS — S60511A Abrasion of right hand, initial encounter: Secondary | ICD-10-CM | POA: Insufficient documentation

## 2023-11-13 DIAGNOSIS — Z87891 Personal history of nicotine dependence: Secondary | ICD-10-CM | POA: Insufficient documentation

## 2023-11-13 DIAGNOSIS — E538 Deficiency of other specified B group vitamins: Secondary | ICD-10-CM | POA: Insufficient documentation

## 2023-11-13 DIAGNOSIS — N182 Chronic kidney disease, stage 2 (mild): Secondary | ICD-10-CM

## 2023-11-13 DIAGNOSIS — E1122 Type 2 diabetes mellitus with diabetic chronic kidney disease: Secondary | ICD-10-CM

## 2023-11-13 DIAGNOSIS — N401 Enlarged prostate with lower urinary tract symptoms: Secondary | ICD-10-CM

## 2023-11-13 DIAGNOSIS — E861 Hypovolemia: Secondary | ICD-10-CM

## 2023-11-13 DIAGNOSIS — R3911 Hesitancy of micturition: Secondary | ICD-10-CM

## 2023-11-13 NOTE — Assessment & Plan Note (Signed)
 Taking diltiazem  for rate control. Blood pressure well-managed. Cardiologist follow-up scheduled for 11/14/2023 - Continue diltiazem  for atrial fibrillation.

## 2023-11-13 NOTE — Assessment & Plan Note (Signed)
  Hospitalized for hypotension and dehydration due to inadequate fluid intake and heat exposure on 7/31-10/27/2023 Symptoms have resolved with increased water intake. Blood pressure stabilized. - Continue increased water intake to maintain hydration

## 2023-11-13 NOTE — Progress Notes (Addendum)
 I,Jesus Bush, CMA,acting as a Neurosurgeon for [employer], NP.,have documented all relevant documentation on the behalf of Jesus Creighton, NP,as directed by  Jesus Creighton, NP while in the presence of Jesus Creighton, NP.  Subjective:  Patient ID: Jesus Bush , male    DOB: [birth year] , 78 y.o.   MRN: [id number]  Chief Complaint  Patient presents with   hospital f/u    Follow up Hospitalization  Patient was admitted to [facility] on 10/24/23 and discharged on 10/27/23. He was treated for hypotension. Treatment for this included . Telephone follow up was done on  He reports good compliance with treatment. He reports this condition is improved.  ----------------------------------------------------------------------------------------- -  Patient is concerned about his blood sugar he stated it has been running around 130s before he eats.      Laceration    Patient reports he cut his arm fixing his lawn mower and he would like for you to look at it to make sure it is healing okay.     HPI Discussed the use of AI scribe software for clinical note transcription with the patient, who gave verbal consent to proceed.  History of Present Illness     Jesus Bush is a 78 year old male who presents for hospital follow-up.  He was admitted to the hospital on July 31st due to a sudden drop in blood pressure after experiencing dizziness while mowing the lawn on a hot day. His wife checked his blood pressure and found it low. He was diagnosed with hypotension due to dehydration and kept in the hospital for treatment till discharge on 10/27/2023.   He has since increased his water intake to about five bottles a day, avoiding sodas, and reports feeling much better. No current symptoms of dizziness.  He has a history of high blood sugar levels, with readings of 120-130 mg/dL postprandial and 899-894 mg/dL fasting. He recently resumed taking his medication for blood sugar after a period of not taking it  due to losing the pills.  He was previously on two blood pressure medications, but is unsure which ones as his wife manages his medications. One blood pressure medication was discontinued during his hospital stay. He is unsure which medication he is currently taking as his wife manages his medications.---mostly likely Cardizem  and Losartan  because from hospital records, the Hospitalist put the Losartan  100 mg every day on hold.  He was prescribed a medication by a urologist to help with frequent urination, which significantly reduced his nighttime urination from four to five times to once or not at all. He has stopped taking this medication. Denies current symptoms of frequent urination.  He received B12 shots in the hospital and has continued with daily B12 pills, which he feels have contributed to his improved well-being. He reports feeling better than he did three months ago.  He has a history of fluid on the lungs and reports that he was told his recent x-ray was clear. He has stopped smoking and sees a pulmonologist, although he cannot recall the frequency of his visits.  Patient states he was doing some work around his  home yesterday and bruised the skin on his right arm , his wife cleaned it and wrapped it up. Tried to look at it but patient refused stating really it's nothing , my wife's got it'.Advised patient to come back if it gets painful, he has a fever or it starts to drain/ooze and he voiced understanding.  Past Medical History:  Diagnosis Date   COPD (chronic obstructive pulmonary disease) (HCC)    Diabetes mellitus without complication (HCC)    Hyperlipidemia    Hypertension    Vitamin D  deficiency      Family History  Problem Relation Age of Onset   Stroke Mother    Heart disease Father    Heart attack Father    Diabetes Sister    Heart disease Sister    Heart attack Brother    Hypertension Brother    Colon cancer Neg Hx    Stomach cancer Neg Hx     Esophageal cancer Neg Hx      Current Outpatient Medications:    acetaminophen  (TYLENOL ) 500 MG tablet, Take 1,000 mg by mouth at bedtime., Disp: , Rfl:    albuterol  (VENTOLIN  HFA) 108 (90 Base) MCG/ACT inhaler, Inhale 2 puffs into the lungs every 6 (six) hours as needed for wheezing or shortness of breath., Disp: 8 g, Rfl: 2   [Paused] alfuzosin  (UROXATRAL ) 10 MG 24 hr tablet, Take 1 tablet (10 mg total) by mouth daily with breakfast., Disp: 30 tablet, Rfl: 11   Blood Glucose Monitoring Suppl (ONE TOUCH ULTRA 2) w/Device KIT, Use as directed to check blood sugars three times daily E11.69, Disp: 1 kit, Rfl: 0   cetirizine  (ZYRTEC  ALLERGY) 10 MG tablet, Take one tablet nightly for allergies., Disp: 90 tablet, Rfl: 1   cyanocobalamin  (VITAMIN B12) 1000 MCG tablet, Take 1 tablet (1,000 mcg total) by mouth daily., Disp: , Rfl:    diltiazem  (DILACOR XR ) 240 MG 24 hr capsule, Take 1 capsule (240 mg total) by mouth daily FOR BLOOD PRESSURE AND HEART RHYTHM, Disp: 90 capsule, Rfl: 3   Fluticasone -Umeclidin-Vilant (TRELEGY ELLIPTA ) 200-62.5-25 MCG/ACT AEPB, Inhale 1 puff into the lungs daily for COPD /Asthma, Disp: 180 each, Rfl: 3   glucose blood (ONETOUCH ULTRA) test strip, Use as instructed to check blood sugars 3 times daily E11.69, Disp: 100 each, Rfl: 12   glucose blood (TRUE METRIX BLOOD GLUCOSE TEST) test strip, Use as instructed to check blood sugars, Disp: 100 each, Rfl: 12   Lancets (ONETOUCH ULTRASOFT) lancets, Use as instructed to check blood sugars three times daily E11.69, Disp: 100 each, Rfl: 12   [Paused] losartan  (COZAAR ) 100 MG tablet, Take 1 tablet (100 mg total) by mouth daily for blood pressure., Disp: 90 tablet, Rfl: 3   magnesium  gluconate (MAGONATE) 500 MG tablet, Take 500 mg by mouth daily. , Disp: , Rfl:    montelukast  (SINGULAIR ) 10 MG tablet, Take 1 tablet (10 mg total) by mouth daily for allergies., Disp: 90 tablet, Rfl: 3   rosuvastatin  (CRESTOR ) 40 MG tablet, Take 1 tablet  (40 mg total) by mouth daily for cholesterol., Disp: 90 tablet, Rfl: 3   True Comfort Safety Lancets MISC, Use as directed to check blood sugars E11.65, Disp: 100 each, Rfl: 2   VITAMIN D  PO, Take 5,000 Units by mouth daily., Disp: , Rfl:    Vonoprazan Fumarate  (VOQUEZNA ) 10 MG TABS, Take 1 tablet by mouth daily., Disp: 90 tablet, Rfl: 2   empagliflozin  (JARDIANCE ) 10 MG TABS tablet, Take 1 tablet (10 mg total) by mouth daily before breakfast. (Patient not taking: Reported on 11/13/2023), Disp: 30 tablet, Rfl: 6   gabapentin  (NEURONTIN ) 300 MG capsule, Take 1 capsule (300 mg total) by mouth at 1-2 hours before bedtime as needed. (Patient not taking: Reported on 10/30/2023), Disp: 90 capsule, Rfl: 3   Allergies  Allergen Reactions  Ppd [Tuberculin Purified Protein Derivative]     Positive PPD.     Review of Systems  Constitutional: Negative.   HENT: Negative.    Respiratory:  Positive for cough.   Cardiovascular: Negative.  Negative for leg swelling.  Skin:        Abrasion on right arm  Psychiatric/Behavioral: Negative.       Today's Vitals   11/13/23 1150  BP: 114/60  Pulse: 74  Temp: 98.2 F (36.8 C)  TempSrc: Oral  Weight: 158 lb (71.7 kg)  Height: 5' 9 (1.753 m)  PainSc: 0-No pain   Body mass index is 23.33 kg/m.  Wt Readings from Last 3 Encounters:  11/13/23 158 lb (71.7 kg)  10/25/23 155 lb 10.3 oz (70.6 kg)  10/21/23 165 lb (74.8 kg)    The 10-year ASCVD risk score (Arnett DK, et al., 2019) is: 47.3%   Values used to calculate the score:     Age: 15 years     Clincally relevant sex: Male     Is Non-Hispanic African American: No     Diabetic: Yes     Tobacco smoker: No     Systolic Blood Pressure: 114 mmHg     Is BP treated: Yes     HDL Cholesterol: 36 mg/dL     Total Cholesterol: 137 mg/dL  Objective:  Physical Exam HENT:     Head: Normocephalic.  Cardiovascular:     Rate and Rhythm: Normal rate and regular rhythm.  Pulmonary:     Effort: Pulmonary  effort is normal.     Breath sounds: Normal breath sounds.  Skin:    Comments: Has an abrasion on his right arm  Neurological:     General: No focal deficit present.     Mental Status: He is alert and oriented to person, place, and time.         Assessment And Plan:  Type 2 diabetes mellitus with stage 2 chronic kidney disease, without long-term current use of insulin  (HCC) Assessment & Plan: Blood glucose levels 120-130 mg/dL postprandial, 899-894 mg/dL fasting. Resumed taking Jardiance  after hospitalization   Hypotension due to hypovolemia Assessment & Plan:  Hospitalized for hypotension and dehydration due to inadequate fluid intake and heat exposure on 7/31-10/27/2023 Symptoms have resolved with increased water intake. Blood pressure stabilized. - Continue increased water intake to maintain hydration   Aortic atherosclerosis (HCC) Assessment & Plan: Continue crestor  40 mg every day    Former smoker (44 pack year hx, quit 2021) -     CT CHEST LUNG CANCER SCREENING LOW DOSE WO CONTRAST; Future  Abrasion of skin of right hand  Vitamin B12 deficiency Assessment & Plan: Received B12 injections during hospitalization with symptom improvement. Currently on daily B12 supplements.   Paroxysmal A-fib [facility]) Assessment & Plan: Taking diltiazem  for rate control. Blood pressure well-managed. Cardiologist follow-up scheduled for 11/14/2023 - Continue diltiazem  for atrial fibrillation.   Benign prostatic hyperplasia with urinary hesitancy Assessment & Plan: Discontinued urologist-prescribed medication ( alfuzosin  10 mg every day ) due to side effects and dissatisfaction. Prefers no further urology consultations.    Assessment & Plan Recent hypotension and dehydration (hospitalized) Hospitalized for hypotension and dehydration due to inadequate fluid intake and heat exposure. Symptoms improved with increased water intake. Blood pressure stabilized. - Continue increased water  intake to maintain hydration.  Type 2 diabetes mellitus Blood glucose levels 120-130 mg/dL postprandial, 899-894 mg/dL fasting. Resumed taking Jardiance  after hospitalization.  Atrial fibrillation Taking diltiazem  for rate control.  Blood pressure well-managed. Cardiologist follow-up scheduled. - Continue diltiazem  for atrial fibrillation. - Follow up with cardiologist.  Hypertension Reduced from two to one antihypertensive medication. Blood pressure well-managed. Possible interaction with urology medication. Cardiologist to evaluate regimen. - Continue current antihypertensive regimen. - Follow up with cardiologist.  Benign prostatic hyperplasia with lower urinary tract symptoms Discontinued urologist-prescribed medication due to side effects and dissatisfaction. Prefers no further urology consultations.  Vitamin B12 deficiency Received B12 injections during hospitalization with symptom improvement. Currently on daily B12 supplements. - Continue daily B12 supplementation.   Return in 14 weeks (on 02/19/2024) for controlled DM check, diabetes.  Patient was given opportunity to ask questions. Patient verbalized understanding of the plan and was able to repeat key elements of the plan. All questions were answered to their satisfaction.    I, Jesus Creighton, NP, have reviewed all documentation for this visit. The documentation on 11/13/2023 for the exam, diagnosis, procedures, and orders are all accurate and complete.   IF YOU HAVE BEEN REFERRED TO A SPECIALIST, IT MAY TAKE 1-2 WEEKS TO SCHEDULE/PROCESS THE REFERRAL. IF YOU HAVE NOT HEARD FROM US /SPECIALIST IN TWO WEEKS, PLEASE GIVE US  A CALL AT [phone].

## 2023-11-13 NOTE — Assessment & Plan Note (Signed)
 Discontinued urologist-prescribed medication ( alfuzosin  10 mg every day ) due to side effects and dissatisfaction. Prefers no further urology consultations.

## 2023-11-13 NOTE — Assessment & Plan Note (Signed)
 Continue crestor  40 mg every day

## 2023-11-13 NOTE — Assessment & Plan Note (Signed)
 Blood glucose levels 120-130 mg/dL postprandial, 899-894 mg/dL fasting. Resumed taking Jardiance  after hospitalization

## 2023-11-13 NOTE — Assessment & Plan Note (Signed)
 Received B12 injections during hospitalization with symptom improvement. Currently on daily B12 supplements.

## 2023-11-13 NOTE — Progress Notes (Unsigned)
 Cardiology Office Note:   Date:  11/14/2023  ID:  Jesus Bush, Jesus Bush [birth year], MRN [id number] PCP: [name], NP  [facility] Providers Cardiologist:  Lynwood Schilling, MD {  History of Present Illness:   Jesus Bush is a 78 y.o. male with history of COPD and bullous emphysema, ongoing tobacco abuse, diabetes, hyperlipidemia and hypertension.  He was admitted to the hospital from 08/22/2022 through 08/31/2022, with significant epigastric discomfort and found to have large left-sided pneumothorax.  Cardiology was consulted due to atrial flutter.  The patient underwent robotic assisted thoracoscopy and left upper lobe wedge resection and apical pleurectomy with mechanical pleurodesis.  The patient converted to normal sinus rhythm and was discharged on Eliquis  5 mg twice daily.   Since I saw him in November he was in the hospital with sepsis.  He was thought to have community-acquired pneumonia.  He had AKI which resolved with volume.  He was in the hospital with hypotension in July.  This was thought to be related to meds and hypovolemia.  He had AKI again that resolved. He had 110 beat run of nonsustained ventricular tachycardia.  Echocardiogram was again unremarkable.  He initially had his dose of calcium  channel blocker reduced but it was increased back when he had the nonsustained VT.  At the time of his presentation and discharge Jardiance  was discontinued for few weeks but restarted.  Losartan  was stopped.   He presents for followup.  He says that he feels well.  The patient denies any new symptoms such as chest discomfort, neck or arm discomfort. There has been no new shortness of breath, PND or orthopnea. There have been no reported palpitations, presyncope or syncope.      ROS: As stated in the HPI and negative for all other systems.  Studies Reviewed:    EKG:     NA  Risk Assessment/Calculations:              Physical Exam:   VS:  BP 130/72   Pulse 73   Ht 5' 9  (1.753 m)   Wt 155 lb (70.3 kg)   SpO2 97%   BMI 22.89 kg/m    Wt Readings from Last 3 Encounters:  11/14/23 155 lb (70.3 kg)  11/13/23 158 lb (71.7 kg)  10/25/23 155 lb 10.3 oz (70.6 kg)     GEN: Well nourished, well developed in no acute distress NECK: No JVD; No carotid bruits CARDIAC: RRR, no murmurs, rubs, gallops RESPIRATORY:  Clear to auscultation without rales, wheezing or rhonchi  ABDOMEN: Soft, non-tender, non-distended EXTREMITIES:  No edema; No deformity   ASSESSMENT AND PLAN:   Atrial fib flutter:   I looked through the records from both hospitalizations and I saw him and there was no atrial fibrillation noted.  Given the fact that he is only had fibrillation when he was in the hospital with his lung issues I will continue to keep him off Eliquis .  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  I looked through a lot of pictures.  In doing this he oftentimes scrapes himself or injures himself minorly and so is at risk of bleeding.   I will send a 4-week monitor to make sure there is no fibrillation but I do not suspect he is having paroxysms of this.  He is had no symptoms.  Status post robotic assisted thoracoscopy with left upper lobe wedge resection: He  is doing well and remains off cigarettes.  Hypertension: He is off the ARB.  His blood pressure is controlled.  He feels much better off of this.  No change in therapy.  Hypercholesterolemia: LDL was 76.  Continue meds as listed.   Tobacco abuse: He is off of cigarettes completely.  NSVT: He has had no symptoms related to this.   I will follow-up with a 4-week monitor as a above        Follow up with me as needed  Signed, Lynwood Schilling, MD

## 2023-11-14 ENCOUNTER — Encounter: Payer: Self-pay | Admitting: Cardiology

## 2023-11-14 ENCOUNTER — Ambulatory Visit: Attending: Cardiology | Admitting: Cardiology

## 2023-11-14 VITALS — BP 130/72 | HR 73 | Ht 69.0 in | Wt 155.0 lb

## 2023-11-14 DIAGNOSIS — I4729 Other ventricular tachycardia: Secondary | ICD-10-CM | POA: Diagnosis not present

## 2023-11-14 NOTE — Patient Instructions (Signed)
 Medication Instructions:  Your physician recommends that you continue on your current medications as directed. Please refer to the Current Medication list given to you today.  *If you need a refill on your cardiac medications before your next appointment, please call your pharmacy*  Lab Work: NONE If you have labs (blood work) drawn today and your tests are completely normal, you will receive your results only by: MyChart Message (if you have MyChart) OR A paper copy in the mail If you have any lab test that is abnormal or we need to change your treatment, we will call you to review the results.  Testing/Procedures: 4 Week Event Monitor  Follow-Up: At [facility], you and your health needs are our priority.  As part of our continuing mission to provide you with exceptional heart care, our providers are all part of one team.  This team includes your primary Cardiologist (physician) and Advanced Practice Providers or APPs (Physician Assistants and Nurse Practitioners) who all work together to provide you with the care you need, when you need it.  Your next appointment:   As needed  Provider:   [place], MD  We recommend signing up for the patient portal called MyChart.  Sign up information is provided on this After Visit Summary.  MyChart is used to connect with patients for Virtual Visits (Telemedicine).  Patients are able to view lab/test results, encounter notes, upcoming appointments, etc.  Non-urgent messages can be sent to your provider as well.   To learn more about what you can do with MyChart, go to [email or web address].

## 2023-11-18 ENCOUNTER — Ambulatory Visit: Payer: Medicare HMO | Admitting: Nurse Practitioner

## 2023-11-19 ENCOUNTER — Other Ambulatory Visit: Payer: Self-pay

## 2023-11-19 ENCOUNTER — Ambulatory Visit
Admission: EM | Admit: 2023-11-19 | Discharge: 2023-11-19 | Disposition: A | Discharge status: Home / Self Care | Attending: Nurse Practitioner | Admitting: Nurse Practitioner

## 2023-11-19 ENCOUNTER — Other Ambulatory Visit (HOSPITAL_COMMUNITY): Payer: Self-pay

## 2023-11-19 DIAGNOSIS — S51801A Unspecified open wound of right forearm, initial encounter: Secondary | ICD-10-CM

## 2023-11-19 MED ORDER — CEPHALEXIN 500 MG PO CAPS
500.0000 mg | ORAL_CAPSULE | Freq: Four times a day (QID) | ORAL | 0 refills | Status: AC
  Filled 2023-11-19 – 2023-11-20 (×2): qty 28, 7d supply, fill #0

## 2023-11-19 MED ORDER — TETANUS-DIPHTH-ACELL PERTUSSIS 5-2.5-18.5 LF-MCG/0.5 IM SUSY
0.5000 mL | PREFILLED_SYRINGE | Freq: Once | INTRAMUSCULAR | Status: AC
  Administered 2023-11-19: 0.5 mL via INTRAMUSCULAR

## 2023-11-19 MED ORDER — MUPIROCIN 2 % EX OINT
1.0000 | TOPICAL_OINTMENT | Freq: Two times a day (BID) | CUTANEOUS | 0 refills | Status: AC
  Filled 2023-11-19: qty 30, 15d supply, fill #0
  Filled 2023-11-20: qty 22, 7d supply, fill #0

## 2023-11-19 NOTE — ED Triage Notes (Signed)
 I was working on a lawnmower and the seat fell down on my right arm cutting it, I think now it might be infected, I need to make sure if it needs anything else. No fever.

## 2023-11-19 NOTE — Discharge Instructions (Addendum)
 Do not apply any more hydrogen peroxide.  Only antibiotic soap and water, wash twice daily, rinse, and pat dry.  Keep wound covered when out in public. Antibiotic therapy four times daily for 7 days, apply the ointment twice daily for 7 days. If this does not begin to heal, gets reddened, or begins draining - please come back in for evaluation.

## 2023-11-19 NOTE — ED Provider Notes (Signed)
 [facility]    CSN: [id number] Arrival date & time: 11/19/23  1312      History   Chief Complaint Chief Complaint  Patient presents with   Wound Check    HPI Jesus Bush is a 78 y.o. male.   Patient is here requesting evaluation for a skin injury to his right forearm that he sustained last week while working on his tractor.  Patient reports that he has been keeping it clean with hydrogen peroxide and his wife has been applying a medication (unsure what it is).  Concerned that the wound may not be healing correctly.  No systemic symptoms such as fever, body aches, chills, or underlying skin changes.  Thinks that his tetanus is up to date.  The history is provided by the patient.  Wound Check Pertinent negatives include no chest pain, no abdominal pain, no headaches and no shortness of breath.    Past Medical History:  Diagnosis Date   COPD (chronic obstructive pulmonary disease) (HCC)    Diabetes mellitus without complication (HCC)    Hyperlipidemia    Hypertension    Vitamin D  deficiency     Patient Active Problem List   Diagnosis Date Noted   Recently quit using tobacco 11/13/2023   Abrasion of skin of right hand 11/13/2023   Vitamin B12 deficiency 11/13/2023   NSVT (nonsustained ventricular tachycardia) (HCC) 10/26/2023   Hypovolemia 10/25/2023   Medication adverse effect 10/25/2023   Hypomagnesemia 10/25/2023   Hypophosphatemia 10/25/2023   Benign prostatic hyperplasia 10/25/2023   Hypotension 10/24/2023   Acute bronchitis due to other specified organisms 07/01/2023   Seasonal allergic rhinitis due to pollen 07/01/2023   Hypertensive heart/kidney disease with chronic kidney disease stage II 06/28/2023   Type 2 diabetes mellitus with hyperglycemia, without long-term current use of insulin  (HCC) 06/26/2023   Type 2 diabetes mellitus with stage 2 chronic kidney disease, without long-term current use of insulin  (HCC) 06/20/2023   Establishing care  with new doctor, encounter for 06/20/2023   Hyperlipidemia associated with type 2 diabetes mellitus (HCC) 06/20/2023   Herpes zoster vaccination declined 06/20/2023   Encounter for hepatitis C screening test for low risk patient 06/20/2023   CAP (community acquired pneumonia) 02/13/2023   S/P robot-assisted surgical procedure 12/20/2022   Paroxysmal A-fib (HCC) 08/27/2022   Tobacco dependence 08/22/2022   Coronary atherosclerosis 09/17/2020   Sebaceous cyst 09/16/2020   Nodule of left lung 02/05/2019   Hyperthyroidism 06/28/2018   Aortic atherosclerosis (HCC) 09/09/2017   COPD (chronic obstructive pulmonary disease) (HCC) 11/08/2016   Esophageal reflux 04/25/2015   Former smoker (44 pack year hx, quit 2021) 07/23/2014   Medication management 04/13/2014   Essential hypertension 02/11/2013   Mixed hyperlipidemia 02/11/2013   Vitamin D  deficiency 02/11/2013    Past Surgical History:  Procedure Laterality Date   CATARACT EXTRACTION, BILATERAL Bilateral 2018   Dr. Rosealee   INTERCOSTAL NERVE BLOCK  08/27/2022   Procedure: INTERCOSTAL NERVE BLOCK;  Surgeon: Shyrl Linnie KIDD, MD;  Location: [facility] OR;  Service: Thoracic;;       Home Medications    Prior to Admission medications   Medication Sig Start Date End Date Taking? Authorizing Provider  cephALEXin  (KEFLEX ) 500 MG capsule Take 1 capsule (500 mg total) by mouth 4 (four) times daily for 7 days. 11/19/23 11/26/23 Yes Janet Therisa PARAS, FNP  mupirocin  ointment (BACTROBAN ) 2 % Apply 1 Application topically 2 (two) times daily for 7 days. 11/19/23 12/04/23 Yes Janet Therisa PARAS, FNP  acetaminophen  (TYLENOL ) 500 MG tablet Take 1,000 mg by mouth at bedtime.    [provider]  albuterol  (VENTOLIN  HFA) 108 (90 Base) MCG/ACT inhaler Inhale 2 puffs into the lungs every 6 (six) hours as needed for wheezing or shortness of breath. 10/26/23   [name], MD  alfuzosin  (UROXATRAL ) 10 MG 24 hr tablet Take 1 tablet (10 mg total) by mouth  daily with breakfast. Patient not taking: Reported on 11/14/2023 10/21/23   [name], MD  Blood Glucose Monitoring Suppl (ONE TOUCH ULTRA 2) w/Device KIT Use as directed to check blood sugars three times daily E11.69 10/09/23   [name], NP  cetirizine  (ZYRTEC  ALLERGY) 10 MG tablet Take one tablet nightly for allergies. 06/26/23   [name], NP  cyanocobalamin  (VITAMIN B12) 1000 MCG tablet Take 1 tablet (1,000 mcg total) by mouth daily. 10/27/23   [name], MD  diltiazem  (DILACOR XR ) 240 MG 24 hr capsule Take 1 capsule (240 mg total) by mouth daily FOR BLOOD PRESSURE AND HEART RHYTHM 04/10/23   Wilkinson, Dana E, NP  empagliflozin  (JARDIANCE ) 10 MG TABS tablet Take 1 tablet (10 mg total) by mouth daily before breakfast. 06/12/23   [name], NP  Fluticasone -Umeclidin-Vilant (TRELEGY ELLIPTA ) 200-62.5-25 MCG/ACT AEPB Inhale 1 puff into the lungs daily for COPD /Asthma 04/10/23   Wilkinson, Dana E, NP  glucose blood (ONETOUCH ULTRA) test strip Use as instructed to check blood sugars 3 times daily E11.69 10/09/23   [name], NP  glucose blood (TRUE METRIX BLOOD GLUCOSE TEST) test strip Use as instructed to check blood sugars 10/02/23   [name], NP  Lancets (ONETOUCH ULTRASOFT) lancets Use as instructed to check blood sugars three times daily E11.69 10/09/23   [name], NP  losartan  (COZAAR ) 100 MG tablet Take 1 tablet (100 mg total) by mouth daily for blood pressure. 04/10/23   Wilkinson, Dana E, NP  magnesium  gluconate (MAGONATE) 500 MG tablet Take 500 mg by mouth daily.     [provider]  montelukast  (SINGULAIR ) 10 MG tablet Take 1 tablet (10 mg total) by mouth daily for allergies. 04/10/23   Wilkinson, Dana E, NP  rosuvastatin  (CRESTOR ) 40 MG tablet Take 1 tablet (40 mg total) by mouth daily for cholesterol. 04/10/23   Wilkinson, Dana E, NP  True Comfort Safety Lancets MISC Use as directed to check blood sugars E11.65 10/02/23   [name], NP  VITAMIN D  PO Take  5,000 Units by mouth daily.    [provider]  Vonoprazan Fumarate  (VOQUEZNA ) 10 MG TABS Take 1 tablet by mouth daily. 09/25/23   [name], NP  glipiZIDE  (GLUCOTROL ) 5 MG tablet Take 0.5-1 tablets (2.5-5 mg total) by mouth 2 (two) times daily with a meal for diabetes. 04/10/23 10/14/23  Jude Lonell BRAVO, NP    Family History Family History  Problem Relation Age of Onset   Stroke Mother    Heart disease Father    Heart attack Father    Diabetes Sister    Heart disease Sister    Heart attack Brother    Hypertension Brother    Colon cancer Neg Hx    Stomach cancer Neg Hx    Esophageal cancer Neg Hx     Social History Social History   Tobacco Use   Smoking status: Former    Current packs/day: 0.00    Average packs/day: 1 pack/day for 44.0 years (44.0 ttl pk-yrs)    Types: Cigarettes    Start date: 61  Quit date: 05/30/2022    Years since quitting: 1.4    Passive exposure: Never   Smokeless tobacco: Never  Vaping Use   Vaping status: Never Used  Substance Use Topics   Alcohol use: No    Alcohol/week: 0.0 standard drinks of alcohol    Comment: He states he no longer drinks alcohol   Drug use: No     Allergies   Ppd [tuberculin purified protein derivative]   Review of Systems Review of Systems  Constitutional:  Negative for chills and fever.  HENT:  Negative for congestion, rhinorrhea and sore throat.   Eyes:  Negative for pain and redness.  Respiratory:  Negative for cough and shortness of breath.   Cardiovascular:  Negative for chest pain.  Gastrointestinal:  Negative for abdominal pain, diarrhea and vomiting.  Genitourinary:  Negative for dysuria.  Musculoskeletal:  Negative for arthralgias and myalgias.  Skin:  Positive for wound.  Neurological:  Negative for dizziness and headaches.  Psychiatric/Behavioral:  Negative for behavioral problems.      Physical Exam Triage Vital Signs ED Triage Vitals  Encounter Vitals Group     BP 11/19/23 1343  (!) 95/53     Girls Systolic BP Percentile --      Girls Diastolic BP Percentile --      Boys Systolic BP Percentile --      Boys Diastolic BP Percentile --      Pulse Rate 11/19/23 1343 67     Resp 11/19/23 1343 18     Temp 11/19/23 1343 97.7 F (36.5 C)     Temp Source 11/19/23 1343 Oral     SpO2 11/19/23 1343 95 %     Weight 11/19/23 1341 154 lb 15.7 oz (70.3 kg)     Height 11/19/23 1341 5' 9 (1.753 m)     Head Circumference --      Peak Flow --      Pain Score 11/19/23 1339 0     Pain Loc --      Pain Education --      Exclude from Growth Chart --    No data found.  Updated Vital Signs BP 113/61 (BP Location: Left Arm)   Pulse 67   Temp 97.7 F (36.5 C) (Oral)   Resp 18   Ht 5' 9 (1.753 m)   Wt 154 lb 15.7 oz (70.3 kg)   SpO2 95%   BMI 22.89 kg/m   Visual Acuity Right Eye Distance:   Left Eye Distance:   Bilateral Distance:    Right Eye Near:   Left Eye Near:    Bilateral Near:     Physical Exam Vitals and nursing note reviewed.  Constitutional:      Appearance: Normal appearance.  HENT:     Head: Normocephalic.  Cardiovascular:     Rate and Rhythm: Normal rate and regular rhythm.     Heart sounds: Normal heart sounds.  Pulmonary:     Effort: Pulmonary effort is normal.     Breath sounds: Wheezing present.     Comments: Inspiratory/expiratory wheezing bilaterally that improves with cough.  (Patient verbalizes that he has been getting over a URI - does have an inhaler that he is using).  No chest pain or shortness of breath. Abdominal:     General: Bowel sounds are normal.  Musculoskeletal:        General: Normal range of motion.  Skin:    General: Skin is warm and dry.  Comments: Documentation by exception: Right Upper Extremity: There is an approximate 6 cm x 2 cm partial thickness skin avulsion to the mid posterior aspect of the forearm with a circular abrasion measuring approximately 2 cm x 2 cm just superior to the wound.  No underlying  erythema, soft tissue warmth, swelling.  Neurovascular status is intact distally.  See photo documentation.  Neurological:     General: No focal deficit present.     Mental Status: He is alert and oriented to person, place, and time.  Psychiatric:        Mood and Affect: Mood normal.        Behavior: Behavior normal.        Thought Content: Thought content normal.        Judgment: Judgment normal.      UC Treatments / Results  Labs (all labs ordered are listed, but only abnormal results are displayed) Labs Reviewed - No data to display  EKG   Radiology No results found.  Procedures Procedures (including critical care time)  Medications Ordered in UC Medications  Tdap (BOOSTRIX) injection 0.5 mL (0.5 mLs Intramuscular Given 11/19/23 1532)    Initial Impression / Assessment and Plan / UC Course  I have reviewed the triage vital signs and the nursing notes.  Pertinent labs & imaging results that were available during my care of the patient were reviewed by me and considered in my medical decision making (see chart for details).     Patient presents for evaluation of a skin abrasion to his right forearm that occurred one week ago.  It appears to be healing slowly which prompted evaluation.  On physical exam, there is an open wound to the posterior aspect of the forearm.  Based upon age, wound, and length of time since injury - reasonable to provide oral antibiotic therapy.  I've also prescribed topical mupirocin .  Daily wound care reviewed.  Discussed follow up with PCP if this worsens or fails to improve.  Tdap immunization updated.    Final Clinical Impressions(s) / UC Diagnoses   Final diagnoses:  Open wound of right forearm, initial encounter     Discharge Instructions      Do not apply any more hydrogen peroxide.  Only antibiotic soap and water, wash twice daily, rinse, and pat dry.  Keep wound covered when out in public. Antibiotic therapy four times daily for 7  days, apply the ointment twice daily for 7 days. If this does not begin to heal, gets reddened, or begins draining - please come back in for evaluation.     ED Prescriptions     Medication Sig Dispense Auth. Provider   cephALEXin  (KEFLEX ) 500 MG capsule Take 1 capsule (500 mg total) by mouth 4 (four) times daily for 7 days. 28 capsule Janet Therisa PARAS, FNP   mupirocin  ointment (BACTROBAN ) 2 % Apply 1 Application topically 2 (two) times daily for 7 days. 30 g Janet Therisa PARAS, FNP      PDMP not reviewed this encounter.   Janet Therisa PARAS, FNP 11/19/23 1538

## 2023-11-20 ENCOUNTER — Inpatient Hospital Stay
Admission: RE | Admit: 2023-11-20 | Discharge: 2023-11-20 | Disposition: A | Discharge status: Home / Self Care | Source: Ambulatory Visit | Attending: Family Medicine | Admitting: Family Medicine

## 2023-11-20 ENCOUNTER — Other Ambulatory Visit (HOSPITAL_COMMUNITY): Payer: Self-pay

## 2023-11-20 ENCOUNTER — Other Ambulatory Visit: Payer: Self-pay

## 2023-11-20 DIAGNOSIS — Z87891 Personal history of nicotine dependence: Secondary | ICD-10-CM

## 2023-12-03 ENCOUNTER — Ambulatory Visit: Payer: Self-pay

## 2023-12-03 NOTE — Telephone Encounter (Signed)
 FYI Only or Action Required?: FYI only for provider.  Patient was last seen in primary care on 11/13/2023 by [name], NP.  Called Nurse Triage reporting Insect Bite.  Symptoms began several days ago.  Interventions attempted: OTC medications: Benadryl cream, Tylenol  and Ice/heat application.  Symptoms are: left side of face, near ear/jaw unknown insect bite with pain/redness/swelling/warmth/itchiness gradually worsening.  Triage Disposition: See Physician Within 24 Hours  Patient/caregiver understands and will follow disposition?: Yes              Copied from CRM #[id number]. Topic: Clinical - Red Word Triage >> Dec 03, 2023  9:10 AM Jesus Bush wrote: Red Word that prompted transfer to Nurse Triage: Patient was bit on Friday 11/29/2023 on the left side of his face by his ear, the area is swelling, warm feeling and redness. He does not know what bit him. Reason for Disposition  [1] Red or very tender (to touch) area AND [2] started over 24 hours after the bite  Answer Assessment - Initial Assessment Questions 1. TYPE of INSECT: What type of insect was it?      Unsure.  2. ONSET: When did you get bitten?      11/29/23 Friday.  3. LOCATION: Where is the insect bite located?      Left side of face along jaw near the left ear.  4. REDNESS: Is the area red or pink? If Yes, ask: What size is the area of redness? (inches or cm). When did the redness start?     Yes. Very red, as big as a quarter or more (1inch by 2 inches). Started the next day (Saturday and worsened since Saturday night)  5. PAIN: Is there any pain? If Yes, ask: How bad is the pain? (Scale 0-10; or none, mild, moderate, severe)     Yes, patient could not sleep Saturday night due to the pain and was not able to lie on his left side of face. 3/10.  6. ITCHING: Does it itch? If Yes, ask: How bad is the itch?      It was itching but not currently itchy.  7. SWELLING: How big is the swelling?  (e.g., inches, cm, or compare to coins)     1/4inch depth of swelling.  8. OTHER SYMPTOMS: Do you have any other symptoms?  (e.g., difficulty breathing, fever, hives)     Warmth at site. Denies fever, difficulty breathing, SOB, difficulty swallowing.  9. PREGNANCY: Is there any chance you are pregnant? When was your last menstrual period?     N/A.  Protocols used: Insect Bite-A-AH

## 2023-12-04 ENCOUNTER — Other Ambulatory Visit: Payer: Self-pay

## 2023-12-04 ENCOUNTER — Ambulatory Visit (INDEPENDENT_AMBULATORY_CARE_PROVIDER_SITE_OTHER): Payer: Self-pay | Admitting: Family Medicine

## 2023-12-04 ENCOUNTER — Encounter: Payer: Self-pay | Admitting: Family Medicine

## 2023-12-04 ENCOUNTER — Other Ambulatory Visit (HOSPITAL_COMMUNITY): Payer: Self-pay

## 2023-12-04 VITALS — BP 110/60 | HR 85 | Temp 98.6°F | Ht 69.0 in | Wt 161.0 lb

## 2023-12-04 DIAGNOSIS — W57XXXA Bitten or stung by nonvenomous insect and other nonvenomous arthropods, initial encounter: Secondary | ICD-10-CM | POA: Insufficient documentation

## 2023-12-04 DIAGNOSIS — L299 Pruritus, unspecified: Secondary | ICD-10-CM | POA: Insufficient documentation

## 2023-12-04 DIAGNOSIS — L03211 Cellulitis of face: Secondary | ICD-10-CM | POA: Insufficient documentation

## 2023-12-04 DIAGNOSIS — Z23 Encounter for immunization: Secondary | ICD-10-CM | POA: Insufficient documentation

## 2023-12-04 MED ORDER — MUPIROCIN 2 % EX OINT
1.0000 | TOPICAL_OINTMENT | Freq: Two times a day (BID) | CUTANEOUS | 0 refills | Status: AC
  Filled 2023-12-04: qty 22, 11d supply, fill #0

## 2023-12-04 MED ORDER — CEPHALEXIN 500 MG PO CAPS
500.0000 mg | ORAL_CAPSULE | Freq: Two times a day (BID) | ORAL | 0 refills | Status: DC
  Filled 2023-12-04: qty 14, 7d supply, fill #0

## 2023-12-04 MED ORDER — TRIAMCINOLONE ACETONIDE 40 MG/ML IJ SUSP
40.0000 mg | Freq: Once | INTRAMUSCULAR | Status: AC
  Administered 2023-12-04: 40 mg via INTRAMUSCULAR

## 2023-12-04 NOTE — Progress Notes (Signed)
 I,[name], CMA,acting as a Neurosurgeon for [employer], NP.,have documented all relevant documentation on the behalf of [name], NP,as directed by  [name], NP while in the presence of [name], NP.  Subjective:  Patient ID: Jesus Bush , male    DOB: 1945-04-23 , 78 y.o.   MRN: [id number]  Chief Complaint  Patient presents with   Insect Bite    HPI Discussed the use of AI scribe software for clinical note transcription with the patient, who gave verbal consent to proceed.  History of Present Illness     Jesus Bush is a 78 year old male who presents with an insect bite on the left side of his face.  He experienced an insect bite on the left side of his face near the jaw on Saturday. He suspects it might have been a spider bite, although he did not see the insect. The bite initially enlarged by Sunday evening but has remained the same size since then. He applies ice to reduce swelling and relieve pressure. The area is itchy, particularly at night, and he experiences discomfort and pain localized to the site of the bite. No fever is present, and he denies any recent exposure to ticks.  He monitors his blood sugar levels daily, with recent readings around 100 to 101 mg/dL. He is not allergic to any antibiotics.  Will start him on cephalexin  500 mg BID for the redness and swelling.     Past Medical History:  Diagnosis Date   COPD (chronic obstructive pulmonary disease) (HCC)    Diabetes mellitus without complication (HCC)    Hyperlipidemia    Hypertension    Vitamin D  deficiency      Family History  Problem Relation Age of Onset   Stroke Mother    Heart disease Father    Heart attack Father    Diabetes Sister    Heart disease Sister    Heart attack Brother    Hypertension Brother    Colon cancer Neg Hx    Stomach cancer Neg Hx    Esophageal cancer Neg Hx      Current Outpatient Medications:    acetaminophen  (TYLENOL ) 500 MG tablet, Take 1,000  mg by mouth at bedtime., Disp: , Rfl:    albuterol  (VENTOLIN  HFA) 108 (90 Base) MCG/ACT inhaler, Inhale 2 puffs into the lungs every 6 (six) hours as needed for wheezing or shortness of breath., Disp: 8 g, Rfl: 2   [Paused] alfuzosin  (UROXATRAL ) 10 MG 24 hr tablet, Take 1 tablet (10 mg total) by mouth daily with breakfast., Disp: 30 tablet, Rfl: 11   Blood Glucose Monitoring Suppl (ONE TOUCH ULTRA 2) w/Device KIT, Use as directed to check blood sugars three times daily E11.69, Disp: 1 kit, Rfl: 0   cephALEXin  (KEFLEX ) 500 MG capsule, Take 1 capsule (500 mg total) by mouth 2 (two) times daily., Disp: 14 capsule, Rfl: 0   cetirizine  (ZYRTEC  ALLERGY) 10 MG tablet, Take one tablet nightly for allergies., Disp: 90 tablet, Rfl: 1   cyanocobalamin  (VITAMIN B12) 1000 MCG tablet, Take 1 tablet (1,000 mcg total) by mouth daily., Disp: , Rfl:    diltiazem  (DILACOR XR ) 240 MG 24 hr capsule, Take 1 capsule (240 mg total) by mouth daily FOR BLOOD PRESSURE AND HEART RHYTHM, Disp: 90 capsule, Rfl: 3   empagliflozin  (JARDIANCE ) 10 MG TABS tablet, Take 1 tablet (10 mg total) by mouth daily before breakfast., Disp: 30 tablet, Rfl: 6   Fluticasone -Umeclidin-Vilant (TRELEGY  ELLIPTA) 200-62.5-25 MCG/ACT AEPB, Inhale 1 puff into the lungs daily for COPD /Asthma, Disp: 180 each, Rfl: 3   glucose blood (ONETOUCH ULTRA) test strip, Use as instructed to check blood sugars 3 times daily E11.69, Disp: 100 each, Rfl: 12   glucose blood (TRUE METRIX BLOOD GLUCOSE TEST) test strip, Use as instructed to check blood sugars, Disp: 100 each, Rfl: 12   Lancets (ONETOUCH ULTRASOFT) lancets, Use as instructed to check blood sugars three times daily E11.69, Disp: 100 each, Rfl: 12   magnesium  gluconate (MAGONATE) 500 MG tablet, Take 500 mg by mouth daily. , Disp: , Rfl:    montelukast  (SINGULAIR ) 10 MG tablet, Take 1 tablet (10 mg total) by mouth daily for allergies., Disp: 90 tablet, Rfl: 3   mupirocin  ointment (BACTROBAN ) 2 %, Apply 1  Application topically 2 (two) times daily., Disp: 22 g, Rfl: 0   rosuvastatin  (CRESTOR ) 40 MG tablet, Take 1 tablet (40 mg total) by mouth daily for cholesterol., Disp: 90 tablet, Rfl: 3   True Comfort Safety Lancets MISC, Use as directed to check blood sugars E11.65, Disp: 100 each, Rfl: 2   VITAMIN D  PO, Take 5,000 Units by mouth daily., Disp: , Rfl:    Vonoprazan Fumarate  (VOQUEZNA ) 10 MG TABS, Take 1 tablet by mouth daily., Disp: 90 tablet, Rfl: 2   losartan  (COZAAR ) 100 MG tablet, Take 1 tablet (100 mg total) by mouth daily for blood pressure., Disp: 90 tablet, Rfl: 3   Allergies  Allergen Reactions   Ppd [Tuberculin Purified Protein Derivative]     Positive PPD.     Review of Systems  Constitutional: Negative.   HENT: Negative.    Respiratory: Negative.    Cardiovascular: Negative.   Skin:  Positive for color change and wound.     Today's Vitals   12/04/23 1547  BP: 110/60  Pulse: 85  Temp: 98.6 F (37 C)  TempSrc: Oral  Weight: 161 lb (73 kg)  Height: 5' 9 (1.753 m)  PainSc: 0-No pain   Body mass index is 23.78 kg/m.  Wt Readings from Last 3 Encounters:  12/04/23 161 lb (73 kg)  11/19/23 154 lb 15.7 oz (70.3 kg)  11/14/23 155 lb (70.3 kg)    The 10-year ASCVD risk score (Arnett DK, et al., 2019) is: 45.2%   Values used to calculate the score:     Age: 39 years     Clincally relevant sex: Male     Is Non-Hispanic African American: No     Diabetic: Yes     Tobacco smoker: No     Systolic Blood Pressure: 110 mmHg     Is BP treated: Yes     HDL Cholesterol: 36 mg/dL     Total Cholesterol: 137 mg/dL  Objective:  Physical Exam HENT:     Head: Normocephalic.  Cardiovascular:     Rate and Rhythm: Normal rate and regular rhythm.  Pulmonary:     Effort: Pulmonary effort is normal.     Breath sounds: Normal breath sounds.  Skin:    Findings: Erythema present.     Comments: Left side face swollen  Neurological:     Mental Status: He is alert and oriented to  person, place, and time. Mental status is at baseline.  Psychiatric:        Mood and Affect: Mood normal.         Assessment And Plan:  Cellulitis of face -     Mupirocin ; Apply 1 Application topically  2 (two) times daily.  Dispense: 22 g; Refill: 0 -     Cephalexin ; Take 1 capsule (500 mg total) by mouth 2 (two) times daily.  Dispense: 14 capsule; Refill: 0  Pruritus -     Triamcinolone  Acetonide    Return if symptoms worsen or fail to improve, for keep next vist.  Patient was given opportunity to ask questions. Patient verbalized understanding of the plan and was able to repeat key elements of the plan. All questions were answered to their satisfaction.    I, [name], NP, have reviewed all documentation for this visit. The documentation on 12/04/2023 for the exam, diagnosis, procedures, and orders are all accurate and complete.   IF YOU HAVE BEEN REFERRED TO A SPECIALIST, IT MAY TAKE 1-2 WEEKS TO SCHEDULE/PROCESS THE REFERRAL. IF YOU HAVE NOT HEARD FROM US /SPECIALIST IN TWO WEEKS, PLEASE GIVE US  A CALL AT [phone].

## 2023-12-05 ENCOUNTER — Other Ambulatory Visit: Payer: Self-pay

## 2023-12-06 ENCOUNTER — Ambulatory Visit
Admission: EM | Admit: 2023-12-06 | Discharge: 2023-12-06 | Disposition: A | Discharge status: Home / Self Care | Attending: Family Medicine | Admitting: Family Medicine

## 2023-12-06 ENCOUNTER — Other Ambulatory Visit (HOSPITAL_COMMUNITY): Payer: Self-pay

## 2023-12-06 DIAGNOSIS — L0291 Cutaneous abscess, unspecified: Secondary | ICD-10-CM | POA: Diagnosis not present

## 2023-12-06 MED ORDER — AMOXICILLIN-POT CLAVULANATE 875-125 MG PO TABS
1.0000 | ORAL_TABLET | Freq: Two times a day (BID) | ORAL | 0 refills | Status: AC
  Filled 2023-12-06: qty 14, 7d supply, fill #0

## 2023-12-06 NOTE — ED Triage Notes (Signed)
 Patient reports going to my own doctor recently and the medicine hasn't worked very well, ice has helped it some. This happened Saturday (something had bitten me in the garage). No fever (since bite).  Of Note: Seen by PCP on 12-04-2023 (Cellulitis of Face, Placed on Cephalexin , Mupirocin ).

## 2023-12-06 NOTE — Discharge Instructions (Signed)
 You were seen today for an abscess to the face.  This was opened and drained today.  I have switched your antibiotic and sent this to the pharmacy.  You should keep the area covered while it drains.  You may use motrin/tylenol  for pain and swelling.  Do not use the antibiotic ointment on this at this time.  Please return if you are not improving as expected.

## 2023-12-06 NOTE — ED Provider Notes (Signed)
 [facility]    CSN: [id number] Arrival date & time: 12/06/23  0820      History   Chief Complaint Chief Complaint  Patient presents with   Wound Check    HPI Jesus Bush is a 78 y.o. male.    Wound Check  Patient is here for  wound to the face.  Last weekend he was bit by something to the left side of the face.  It started swelling the next day.  He did see his pcp, given keflex  bid and mupicirin.   Last night it was more swollen.  Today he used ice and was helpful.  He has an area that is draining yellow material.        Past Medical History:  Diagnosis Date   COPD (chronic obstructive pulmonary disease) (HCC)    Diabetes mellitus without complication (HCC)    Hyperlipidemia    Hypertension    Vitamin D  deficiency     Patient Active Problem List   Diagnosis Date Noted   Need for influenza vaccination 12/04/2023   Insect bite of face with local reaction 12/04/2023   Cellulitis of face 12/04/2023   Pruritus 12/04/2023   Recently quit using tobacco 11/13/2023   Abrasion of skin of right hand 11/13/2023   Vitamin B12 deficiency 11/13/2023   NSVT (nonsustained ventricular tachycardia) (HCC) 10/26/2023   Hypovolemia 10/25/2023   Medication adverse effect 10/25/2023   Hypomagnesemia 10/25/2023   Hypophosphatemia 10/25/2023   Benign prostatic hyperplasia 10/25/2023   Hypotension 10/24/2023   Acute bronchitis due to other specified organisms 07/01/2023   Seasonal allergic rhinitis due to pollen 07/01/2023   Hypertensive heart/kidney disease with chronic kidney disease stage II 06/28/2023   Type 2 diabetes mellitus with hyperglycemia, without long-term current use of insulin  (HCC) 06/26/2023   Type 2 diabetes mellitus with stage 2 chronic kidney disease, without long-term current use of insulin  (HCC) 06/20/2023   Establishing care with new doctor, encounter for 06/20/2023   Hyperlipidemia associated with type 2 diabetes mellitus (HCC) 06/20/2023    Herpes zoster vaccination declined 06/20/2023   Encounter for hepatitis C screening test for low risk patient 06/20/2023   CAP (community acquired pneumonia) 02/13/2023   S/P robot-assisted surgical procedure 12/20/2022   Paroxysmal A-fib (HCC) 08/27/2022   Tobacco dependence 08/22/2022   Coronary atherosclerosis 09/17/2020   Sebaceous cyst 09/16/2020   Nodule of left lung 02/05/2019   Hyperthyroidism 06/28/2018   Aortic atherosclerosis (HCC) 09/09/2017   COPD (chronic obstructive pulmonary disease) (HCC) 11/08/2016   Esophageal reflux 04/25/2015   Former smoker (44 pack year hx, quit 2021) 07/23/2014   Medication management 04/13/2014   Essential hypertension 02/11/2013   Mixed hyperlipidemia 02/11/2013   Vitamin D  deficiency 02/11/2013    Past Surgical History:  Procedure Laterality Date   CATARACT EXTRACTION, BILATERAL Bilateral 2018   Dr. Rosealee   INTERCOSTAL NERVE BLOCK  08/27/2022   Procedure: INTERCOSTAL NERVE BLOCK;  Surgeon: Shyrl Linnie KIDD, MD;  Location: [facility] OR;  Service: Thoracic;;       Home Medications    Prior to Admission medications   Medication Sig Start Date End Date Taking? Authorizing Provider  cephALEXin  (KEFLEX ) 500 MG capsule Take 1 capsule (500 mg total) by mouth 2 (two) times daily. 12/04/23  Yes [name], NP  acetaminophen  (TYLENOL ) 500 MG tablet Take 1,000 mg by mouth at bedtime.    [provider]  albuterol  (VENTOLIN  HFA) 108 (90 Base) MCG/ACT inhaler Inhale 2 puffs into the lungs  every 6 (six) hours as needed for wheezing or shortness of breath. 10/26/23   [name], MD  alfuzosin  (UROXATRAL ) 10 MG 24 hr tablet Take 1 tablet (10 mg total) by mouth daily with breakfast. 10/21/23   [name], MD  Blood Glucose Monitoring Suppl (ONE TOUCH ULTRA 2) w/Device KIT Use as directed to check blood sugars three times daily E11.69 10/09/23   [name], NP  cetirizine  (ZYRTEC  ALLERGY) 10 MG tablet Take one tablet  nightly for allergies. 06/26/23   [name], NP  cyanocobalamin  (VITAMIN B12) 1000 MCG tablet Take 1 tablet (1,000 mcg total) by mouth daily. 10/27/23   [name], MD  diltiazem  (DILACOR XR ) 240 MG 24 hr capsule Take 1 capsule (240 mg total) by mouth daily FOR BLOOD PRESSURE AND HEART RHYTHM 04/10/23   Wilkinson, Dana E, NP  empagliflozin  (JARDIANCE ) 10 MG TABS tablet Take 1 tablet (10 mg total) by mouth daily before breakfast. 06/12/23   [name], NP  Fluticasone -Umeclidin-Vilant (TRELEGY ELLIPTA ) 200-62.5-25 MCG/ACT AEPB Inhale 1 puff into the lungs daily for COPD /Asthma 04/10/23   Wilkinson, Dana E, NP  glucose blood (ONETOUCH ULTRA) test strip Use as instructed to check blood sugars 3 times daily E11.69 10/09/23   [name], NP  glucose blood (TRUE METRIX BLOOD GLUCOSE TEST) test strip Use as instructed to check blood sugars 10/02/23   [name], NP  Lancets (ONETOUCH ULTRASOFT) lancets Use as instructed to check blood sugars three times daily E11.69 10/09/23   [name], NP  magnesium  gluconate (MAGONATE) 500 MG tablet Take 500 mg by mouth daily.     [provider]  montelukast  (SINGULAIR ) 10 MG tablet Take 1 tablet (10 mg total) by mouth daily for allergies. 04/10/23   Wilkinson, Dana E, NP  mupirocin  ointment (BACTROBAN ) 2 % Apply 1 Application topically 2 (two) times daily. 12/04/23   [name], NP  rosuvastatin  (CRESTOR ) 40 MG tablet Take 1 tablet (40 mg total) by mouth daily for cholesterol. 04/10/23   Wilkinson, Dana E, NP  True Comfort Safety Lancets MISC Use as directed to check blood sugars E11.65 10/02/23   [name], NP  VITAMIN D  PO Take 5,000 Units by mouth daily.    [provider]  Vonoprazan Fumarate  (VOQUEZNA ) 10 MG TABS Take 1 tablet by mouth daily. 09/25/23   [name], NP  glipiZIDE  (GLUCOTROL ) 5 MG tablet Take 0.5-1 tablets (2.5-5 mg total) by mouth 2 (two) times daily with a meal for diabetes. 04/10/23 10/14/23  Jude Lonell BRAVO, NP     Family History Family History  Problem Relation Age of Onset   Stroke Mother    Heart disease Father    Heart attack Father    Diabetes Sister    Heart disease Sister    Heart attack Brother    Hypertension Brother    Colon cancer Neg Hx    Stomach cancer Neg Hx    Esophageal cancer Neg Hx     Social History Social History   Tobacco Use   Smoking status: Former    Current packs/day: 0.00    Average packs/day: 1 pack/day for 44.0 years (44.0 ttl pk-yrs)    Types: Cigarettes    Start date: 39    Quit date: 05/30/2022    Years since quitting: 1.5    Passive exposure: Never   Smokeless tobacco: Never  Vaping Use   Vaping status: Never Used  Substance Use Topics   Alcohol use: No  Alcohol/week: 0.0 standard drinks of alcohol    Comment: He states he no longer drinks alcohol   Drug use: No     Allergies   Ppd [tuberculin purified protein derivative]   Review of Systems Review of Systems  Constitutional: Negative.   HENT: Negative.    Respiratory: Negative.    Cardiovascular: Negative.   Gastrointestinal: Negative.   Genitourinary: Negative.   Musculoskeletal: Negative.   Skin:  Positive for wound.     Physical Exam Triage Vital Signs ED Triage Vitals  Encounter Vitals Group     BP 12/06/23 0841 116/75     Girls Systolic BP Percentile --      Girls Diastolic BP Percentile --      Boys Systolic BP Percentile --      Boys Diastolic BP Percentile --      Pulse Rate 12/06/23 0841 99     Resp 12/06/23 0841 18     Temp 12/06/23 0841 (!) 97.4 F (36.3 C)     Temp Source 12/06/23 0841 Oral     SpO2 12/06/23 0841 93 %     Weight 12/06/23 0838 165 lb (74.8 kg)     Height 12/06/23 0838 5' 11 (1.803 m)     Head Circumference --      Peak Flow --      Pain Score 12/06/23 0834 2     Pain Loc --      Pain Education --      Exclude from Growth Chart --    No data found.  Updated Vital Signs BP 116/75 (BP Location: Left Arm)   Pulse 99   Temp (!)  97.4 F (36.3 C) (Oral)   Resp 18   Ht 5' 11 (1.803 m)   Wt 74.8 kg   SpO2 93%   BMI 23.01 kg/m   Visual Acuity Right Eye Distance:   Left Eye Distance:   Bilateral Distance:    Right Eye Near:   Left Eye Near:    Bilateral Near:     Physical Exam Constitutional:      Appearance: Normal appearance.  Skin:    Comments: At the left posterior jaw area is an open wound, slight yellow drainage with surrounding erythema and tenderness;  the area is hard  Neurological:     General: No focal deficit present.  Psychiatric:        Mood and Affect: Mood normal.      UC Treatments / Results  Labs (all labs ordered are listed, but only abnormal results are displayed) Labs Reviewed - No data to display  EKG   Radiology No results found.  Procedures Incision and Drainage  Date/Time: 12/06/2023 9:20 AM  Performed by: Darral Longs, MD Authorized by: Darral Longs, MD   Consent:    Consent obtained:  Verbal   Consent given by:  Patient   Risks discussed:  Bleeding, incomplete drainage and pain Location:    Type:  Abscess   Location: left jaw. Pre-procedure details:    Skin preparation:  Chlorhexidine  with alcohol Sedation:    Sedation type:  None Anesthesia:    Anesthesia method:  Local infiltration   Local anesthetic:  Lidocaine  1% WITH epi Procedure type:    Complexity:  Simple Procedure details:    Incision types:  Stab incision   Incision depth:  Submucosal   Drainage:  Purulent   Drainage amount:  Moderate   Wound treatment:  Wound left open   Packing materials:  None  Post-procedure details:    Procedure completion:  Tolerated  (including critical care time)  Medications Ordered in UC Medications - No data to display  Initial Impression / Assessment and Plan / UC Course  I have reviewed the triage vital signs and the nursing notes.  Pertinent labs & imaging results that were available during my care of the patient were reviewed by me and  considered in my medical decision making (see chart for details).   Final Clinical Impressions(s) / UC Diagnoses   Final diagnoses:  Abscess     Discharge Instructions      You were seen today for an abscess to the face.  This was opened and drained today.  I have switched your antibiotic and sent this to the pharmacy.  You should keep the area covered while it drains.  You may use motrin/tylenol  for pain and swelling.  Do not use the antibiotic ointment on this at this time.  Please return if you are not improving as expected.     ED Prescriptions     Medication Sig Dispense Auth. Provider   amoxicillin -clavulanate (AUGMENTIN ) 875-125 MG tablet Take 1 tablet by mouth every 12 (twelve) hours. 14 tablet Darral Longs, MD      PDMP not reviewed this encounter.   Darral Longs, MD 12/06/23 [phone]

## 2023-12-13 ENCOUNTER — Other Ambulatory Visit (HOSPITAL_COMMUNITY): Payer: Self-pay

## 2024-01-01 ENCOUNTER — Ambulatory Visit: Attending: Cardiology

## 2024-01-01 DIAGNOSIS — I4729 Other ventricular tachycardia: Secondary | ICD-10-CM

## 2024-01-02 ENCOUNTER — Encounter: Payer: Self-pay | Admitting: Family Medicine

## 2024-01-02 ENCOUNTER — Ambulatory Visit (INDEPENDENT_AMBULATORY_CARE_PROVIDER_SITE_OTHER): Payer: Self-pay | Admitting: Family Medicine

## 2024-01-02 VITALS — BP 120/60 | HR 75 | Temp 98.2°F | Ht 71.0 in | Wt 166.0 lb

## 2024-01-02 DIAGNOSIS — I480 Paroxysmal atrial fibrillation: Secondary | ICD-10-CM | POA: Diagnosis not present

## 2024-01-02 DIAGNOSIS — I4729 Other ventricular tachycardia: Secondary | ICD-10-CM | POA: Diagnosis not present

## 2024-01-02 DIAGNOSIS — H903 Sensorineural hearing loss, bilateral: Secondary | ICD-10-CM | POA: Diagnosis not present

## 2024-01-02 NOTE — Progress Notes (Signed)
 I,Jameka J Bush, CMA,acting as a Neurosurgeon for [employer], NP.,have documented all relevant documentation on the behalf of Jesus Creighton, NP,as directed by  Jesus Creighton, NP while in the presence of Jesus Creighton, NP.  Subjective:  Patient ID: Jesus Bush , male    DOB: 02/19/1946 , 78 y.o.   MRN: [id number]  Chief Complaint  Patient presents with   Hearing Loss    HPI Discussed the use of AI scribe software for clinical note transcription with the patient, who gave verbal consent to proceed.  History of Present Illness     [name] is a 78 year old male with a medical diagnosis of diabetes, paroxymal A-fib who presents with bilateral hearing loss.  He states that the bilateral hearing loss started about ten years ago, and gradual worsening over time. He has never used hearing aids and mentions that he 'hardly ever gets any wax out of his ear.'  He had an office hearing test done and  failed an office hearing test. Will refer him to ENT specialist.  Patient voiced understanding, he is accompanied with his wife.     Past Medical History:  Diagnosis Date   COPD (chronic obstructive pulmonary disease) (HCC)    Diabetes mellitus without complication (HCC)    Hyperlipidemia    Hypertension    Vitamin D  deficiency      Family History  Problem Relation Age of Onset   Stroke Mother    Heart disease Father    Heart attack Father    Diabetes Sister    Heart disease Sister    Heart attack Brother    Hypertension Brother    Colon cancer Neg Hx    Stomach cancer Neg Hx    Esophageal cancer Neg Hx      Current Outpatient Medications:    acetaminophen  (TYLENOL ) 500 MG tablet, Take 1,000 mg by mouth at bedtime., Disp: , Rfl:    albuterol  (VENTOLIN  HFA) 108 (90 Base) MCG/ACT inhaler, Inhale 2 puffs into the lungs every 6 (six) hours as needed for wheezing or shortness of breath., Disp: 8 g, Rfl: 2   [Paused] alfuzosin  (UROXATRAL ) 10 MG 24 hr tablet, Take 1 tablet (10 mg  total) by mouth daily with breakfast., Disp: 30 tablet, Rfl: 11   amoxicillin -clavulanate (AUGMENTIN ) 875-125 MG tablet, Take 1 tablet by mouth every 12 (twelve) hours., Disp: 14 tablet, Rfl: 0   Blood Glucose Monitoring Suppl (ONE TOUCH ULTRA 2) w/Device KIT, Use as directed to check blood sugars three times daily E11.69, Disp: 1 kit, Rfl: 0   cetirizine  (ZYRTEC  ALLERGY) 10 MG tablet, Take one tablet nightly for allergies., Disp: 90 tablet, Rfl: 1   cyanocobalamin  (VITAMIN B12) 1000 MCG tablet, Take 1 tablet (1,000 mcg total) by mouth daily., Disp: , Rfl:    diltiazem  (DILACOR XR ) 240 MG 24 hr capsule, Take 1 capsule (240 mg total) by mouth daily FOR BLOOD PRESSURE AND HEART RHYTHM, Disp: 90 capsule, Rfl: 3   empagliflozin  (JARDIANCE ) 10 MG TABS tablet, Take 1 tablet (10 mg total) by mouth daily before breakfast., Disp: 30 tablet, Rfl: 6   Fluticasone -Umeclidin-Vilant (TRELEGY ELLIPTA ) 200-62.5-25 MCG/ACT AEPB, Inhale 1 puff into the lungs daily for COPD /Asthma, Disp: 180 each, Rfl: 3   glucose blood (ONETOUCH ULTRA) test strip, Use as instructed to check blood sugars 3 times daily E11.69, Disp: 100 each, Rfl: 12   glucose blood (TRUE METRIX BLOOD GLUCOSE TEST) test strip, Use as instructed to check blood sugars,  Disp: 100 each, Rfl: 12   Lancets (ONETOUCH ULTRASOFT) lancets, Use as instructed to check blood sugars three times daily E11.69, Disp: 100 each, Rfl: 12   magnesium  gluconate (MAGONATE) 500 MG tablet, Take 500 mg by mouth daily. , Disp: , Rfl:    montelukast  (SINGULAIR ) 10 MG tablet, Take 1 tablet (10 mg total) by mouth daily for allergies., Disp: 90 tablet, Rfl: 3   mupirocin  ointment (BACTROBAN ) 2 %, Apply 1 Application topically 2 (two) times daily., Disp: 22 g, Rfl: 0   rosuvastatin  (CRESTOR ) 40 MG tablet, Take 1 tablet (40 mg total) by mouth daily for cholesterol., Disp: 90 tablet, Rfl: 3   True Comfort Safety Lancets MISC, Use as directed to check blood sugars E11.65, Disp: 100 each,  Rfl: 2   VITAMIN D  PO, Take 5,000 Units by mouth daily., Disp: , Rfl:    Vonoprazan Fumarate  (VOQUEZNA ) 10 MG TABS, Take 1 tablet by mouth daily., Disp: 90 tablet, Rfl: 2   Allergies  Allergen Reactions   Ppd [Tuberculin Purified Protein Derivative]     Positive PPD.     Review of Systems  Constitutional: Negative.  Negative for appetite change, fatigue and fever.  HENT:  Positive for hearing loss. Negative for ear discharge and ear pain.   Respiratory: Negative.    Cardiovascular: Negative.   Gastrointestinal: Negative.   Neurological: Negative.   Psychiatric/Behavioral: Negative.       Today's Vitals   01/02/24 1619  BP: 120/60  Pulse: 75  Temp: 98.2 F (36.8 C)  TempSrc: Oral  Weight: 166 lb (75.3 kg)  Height: 5' 11 (1.803 m)  PainSc: 0-No pain   Body mass index is 23.15 kg/m.  Wt Readings from Last 3 Encounters:  01/02/24 166 lb (75.3 kg)  12/06/23 165 lb (74.8 kg)  12/04/23 161 lb (73 kg)    The 10-year ASCVD risk score (Arnett DK, et al., 2019) is: 50.5%   Values used to calculate the score:     Age: 81 years     Clincally relevant sex: Male     Is Non-Hispanic African American: No     Diabetic: Yes     Tobacco smoker: No     Systolic Blood Pressure: 120 mmHg     Is BP treated: Yes     HDL Cholesterol: 36 mg/dL     Total Cholesterol: 137 mg/dL  Objective:  Physical Exam HENT:     Head: Normocephalic and atraumatic.     Right Ear: Tympanic membrane and ear canal normal. There is no impacted cerumen.     Left Ear: Tympanic membrane and ear canal normal. There is no impacted cerumen.  Cardiovascular:     Rate and Rhythm: Normal rate and regular rhythm.  Pulmonary:     Effort: Pulmonary effort is normal.     Breath sounds: Normal breath sounds.  Neurological:     Mental Status: He is alert and oriented to person, place, and time. Mental status is at baseline.  Psychiatric:        Mood and Affect: Mood normal.        Behavior: Behavior normal.          Assessment And Plan:  Sensorineural hearing loss (SNHL) of both ears -     Ambulatory referral to ENT    Return for keep next appt.  Patient was given opportunity to ask questions. Patient verbalized understanding of the plan and was able to repeat key elements of the plan. All questions  were answered to their satisfaction. Assessment & Plan Bilateral sensorineural hearing loss  - Refer to ENT for evaluation and management.   IF YOU HAVE BEEN REFERRED TO A SPECIALIST, IT MAY TAKE 1-2 WEEKS TO SCHEDULE/PROCESS THE REFERRAL. IF YOU HAVE NOT HEARD FROM US /SPECIALIST IN TWO WEEKS, PLEASE GIVE US  A CALL AT [phone].

## 2024-01-04 ENCOUNTER — Ambulatory Visit: Payer: Self-pay | Admitting: Cardiology

## 2024-01-13 DIAGNOSIS — H903 Sensorineural hearing loss, bilateral: Secondary | ICD-10-CM | POA: Insufficient documentation

## 2024-01-13 NOTE — Assessment & Plan Note (Signed)
 Rate controlled. Continue cardizem  240 mg every day.

## 2024-01-13 NOTE — Assessment & Plan Note (Signed)
 Chronic worsening over ten years, failed office hearing test, no prior hearing aids.

## 2024-01-16 ENCOUNTER — Encounter (INDEPENDENT_AMBULATORY_CARE_PROVIDER_SITE_OTHER): Payer: Self-pay

## 2024-01-17 ENCOUNTER — Other Ambulatory Visit (HOSPITAL_COMMUNITY): Payer: Self-pay

## 2024-01-19 NOTE — Progress Notes (Deleted)
 Assessment: 1.  BPH with lower urinary tract symptoms.  He does empty well  2.  Balanitis, mild   Plan: 1.  I reassured him about his exam, as well as his normal PSA  2.  He would like to consider medical therapy.  I will start him on alfuzosin   3.  Proper hygiene for his foreskin discussed  4.  I will see back in about 3 months to recheck symptoms    History of Present Illness:  7.28.2025: Initial visit for evaluation of lower urinary tract symptoms, especially nocturia.  He has had worsening urinary symptoms over the past few months/years.  Currently, IPSS 32/6.  He is not on any medical therapy for BPH.  That he knows of, there is no family history of prostate problems or prostate cancer.  PSA in October 2024 was 1.60.  He was started on alfuzosin   10.26.2025:   Past Medical History:  Past Medical History:  Diagnosis Date   COPD (chronic obstructive pulmonary disease) (HCC)    Diabetes mellitus without complication (HCC)    Hyperlipidemia    Hypertension    Vitamin D  deficiency     Past Surgical History:  Past Surgical History:  Procedure Laterality Date   CATARACT EXTRACTION, BILATERAL Bilateral 2018   Dr. Rosealee   INTERCOSTAL NERVE BLOCK  08/27/2022   Procedure: INTERCOSTAL NERVE BLOCK;  Surgeon: Shyrl Linnie KIDD, MD;  Location: [facility] OR;  Service: Thoracic;;    Allergies:  Allergies  Allergen Reactions   Ppd [Tuberculin Purified Protein Derivative]     Positive PPD.    Family History:  Family History  Problem Relation Age of Onset   Stroke Mother    Heart disease Father    Heart attack Father    Diabetes Sister    Heart disease Sister    Heart attack Brother    Hypertension Brother    Colon cancer Neg Hx    Stomach cancer Neg Hx    Esophageal cancer Neg Hx     Social History:  Social History   Tobacco Use   Smoking status: Former    Current packs/day: 0.00    Average packs/day: 1 pack/day for 44.0 years (44.0 ttl pk-yrs)     Types: Cigarettes    Start date: 66    Quit date: 05/30/2022    Years since quitting: 1.6    Passive exposure: Never   Smokeless tobacco: Never  Vaping Use   Vaping status: Never Used  Substance Use Topics   Alcohol use: No    Alcohol/week: 0.0 standard drinks of alcohol    Comment: He states he no longer drinks alcohol   Drug use: No    Review of symptoms:  Constitutional:  Negative for unexplained weight loss, night sweats, fever, chills ENT:  Negative for nose bleeds, sinus pain, painful swallowing CV:  Negative for chest pain, shortness of breath, exercise intolerance, palpitations, loss of consciousness Resp:  Negative for cough, wheezing, shortness of breath GI:  Negative for nausea, vomiting, diarrhea, bloody stools GU:  Positives noted in HPI; otherwise negative for gross hematuria, dysuria, urinary incontinence Neuro:  Negative for seizures, poor balance, limb weakness, slurred speech Psych:  Negative for lack of energy, depression, anxiety Endocrine:  Negative for polydipsia, polyuria, symptoms of hypoglycemia (dizziness, hunger, sweating) Hematologic:  Negative for anemia, purpura, petechia, prolonged or excessive bleeding, use of anticoagulants  Allergic:  Negative for difficulty breathing or choking as a result of exposure to anything; no shellfish allergy; no  allergic response (rash/itch) to materials, foods  Physical exam: There were no vitals taken for this visit. GENERAL APPEARANCE:  Well appearing, well developed, well nourished, NAD HEENT: Atraumatic, Normocephalic. NECK: Normal appearance LUNGS: Normal inspiratory and expiratory excursion HEART: Regular Rate ABDOMEN: Protuberant, no inguinal hernias GU: Phallus on circumcised.  No phimosis.  Mild balanitis scrotal skin normal. Testicles/epididymal structures normal. Meatus normal. Normal anal sphincter tone, prostate 60 mL, symmetric, non nodular, non tender. EXTREMITIES: Moves all extremities well.  Without  clubbing, cyanosis, or edema. NEUROLOGIC:  Alert and oriented x 3, normal gait, CN II-XII grossly intact.  MENTAL STATUS:  Appropriate. SKIN:  Warm, dry and intact.    Results:   I have reviewed referring/prior physicians notes  I have reviewed urinalysis--clear  I have reviewed PSA results--most recently 1.6  I have reviewed prior imaging--CT images from May 2024.  Estimated prostate volume 55 mL  I have reviewed chemistry/CBC results--glucose 204

## 2024-01-20 ENCOUNTER — Ambulatory Visit: Admitting: Urology

## 2024-01-20 DIAGNOSIS — R39198 Other difficulties with micturition: Secondary | ICD-10-CM

## 2024-01-20 DIAGNOSIS — R351 Nocturia: Secondary | ICD-10-CM

## 2024-01-20 DIAGNOSIS — N138 Other obstructive and reflux uropathy: Secondary | ICD-10-CM

## 2024-01-29 ENCOUNTER — Encounter: Payer: Medicare HMO | Admitting: Internal Medicine

## 2024-01-31 ENCOUNTER — Institutional Professional Consult (permissible substitution) (INDEPENDENT_AMBULATORY_CARE_PROVIDER_SITE_OTHER): Admitting: Physician Assistant

## 2024-02-05 ENCOUNTER — Ambulatory Visit: Payer: Self-pay

## 2024-02-05 DIAGNOSIS — Z0000 Encounter for general adult medical examination without abnormal findings: Secondary | ICD-10-CM | POA: Diagnosis not present

## 2024-02-05 NOTE — Patient Instructions (Signed)
 Jesus Bush,  Thank you for taking the time for your Medicare Wellness Visit. I appreciate your continued commitment to your health goals. Please review the care plan we discussed, and feel free to reach out if I can assist you further.  Please note that Annual Wellness Visits do not include a physical exam. Some assessments may be limited, especially if the visit was conducted virtually. If needed, we may recommend an in-person follow-up with your provider.  Ongoing Care Seeing your primary care provider every 3 to 6 months helps us  monitor your health and provide consistent, personalized care.   Referrals If a referral was made during today's visit and you haven't received any updates within two weeks, please contact the referred provider directly to check on the status.  Recommended Screenings:  Health Maintenance  Topic Date Due   Zoster (Shingles) Vaccine (1 of 2) 02/03/1964   COVID-19 Vaccine (3 - Pfizer risk series) 07/06/2019   Medicare Annual Wellness Visit  04/11/2023   Eye exam for diabetics  05/17/2023   Flu Shot  10/25/2023   Complete foot exam   01/11/2024   Hemoglobin A1C  03/27/2024   Yearly kidney health urinalysis for diabetes  06/11/2024   Yearly kidney function blood test for diabetes  10/26/2024   Screening for Lung Cancer  11/19/2024   DTaP/Tdap/Td vaccine (4 - Td or Tdap) 11/18/2033   Pneumococcal Vaccine for age over 71  Completed   Hepatitis C Screening  Completed   Meningitis B Vaccine  Aged Out   Cologuard (Stool DNA test)  Discontinued       02/05/2024   12:26 PM  Advanced Directives  Does Patient Have a Medical Advance Directive? Yes  Type of Advance Directive Living will    Vision: Annual vision screenings are recommended for early detection of glaucoma, cataracts, and diabetic retinopathy. These exams can also reveal signs of chronic conditions such as diabetes and high blood pressure.  Dental: Annual dental screenings help detect early signs  of oral cancer, gum disease, and other conditions linked to overall health, including heart disease and diabetes.  Please see the attached documents for additional preventive care recommendations.

## 2024-02-05 NOTE — Progress Notes (Cosign Needed Addendum)
 Chief Complaint  Patient presents with   Medicare Wellness     Subjective:   CORBYN WILDEY is a 78 y.o. male who presents for a Medicare Annual Wellness Visit.  Allergies (verified) Ppd [tuberculin purified protein derivative]   History: Past Medical History:  Diagnosis Date   COPD (chronic obstructive pulmonary disease) (HCC)    Diabetes mellitus without complication (HCC)    Hyperlipidemia    Hypertension    Vitamin D  deficiency    Past Surgical History:  Procedure Laterality Date   CATARACT EXTRACTION, BILATERAL Bilateral 2018   Dr. Rosealee   INTERCOSTAL NERVE BLOCK  08/27/2022   Procedure: INTERCOSTAL NERVE BLOCK;  Surgeon: Shyrl Linnie KIDD, MD;  Location: [facility] OR;  Service: Thoracic;;   Family History  Problem Relation Age of Onset   Stroke Mother    Heart disease Father    Heart attack Father    Diabetes Sister    Heart disease Sister    Heart attack Brother    Hypertension Brother    Colon cancer Neg Hx    Stomach cancer Neg Hx    Esophageal cancer Neg Hx    Social History   Occupational History   Occupation: retired   Occupation: retired  Tobacco Use   Smoking status: Former    Current packs/day: 0.00    Average packs/day: 1 pack/day for 44.0 years (44.0 ttl pk-yrs)    Types: Cigarettes    Start date: 6    Quit date: 05/30/2022    Years since quitting: 1.6    Passive exposure: Never   Smokeless tobacco: Never  Vaping Use   Vaping status: Never Used  Substance and Sexual Activity   Alcohol use: No    Alcohol/week: 0.0 standard drinks of alcohol    Comment: He states he no longer drinks alcohol   Drug use: No   Sexual activity: Not Currently   Tobacco Counseling Counseling given: Not Answered  SDOH Screenings   Food Insecurity: No Food Insecurity (02/05/2024)  Housing: Unknown (02/05/2024)  Transportation Needs: No Transportation Needs (02/05/2024)  Utilities: Not At Risk (02/05/2024)  Alcohol Screen: Low Risk  (02/05/2024)   Depression (PHQ2-9): Low Risk  (02/05/2024)  Financial Resource Strain: Low Risk  (02/05/2024)  Physical Activity: Inactive (02/05/2024)  Social Connections: Moderately Isolated (02/05/2024)  Stress: No Stress Concern Present (02/05/2024)  Tobacco Use: Medium Risk (02/05/2024)  Health Literacy: Adequate Health Literacy (02/05/2024)   Depression Screen    02/05/2024   12:23 PM 11/13/2023   11:53 AM 09/25/2023    2:07 PM 06/12/2023    2:50 PM 09/05/2022    7:55 PM 04/10/2022   12:11 PM 01/07/2022    9:54 PM  PHQ 2/9 Scores  PHQ - 2 Score 0 0 0 0 0 0 0  PHQ- 9 Score 0 0   0         Data saved with a previous flowsheet row definition     Goals Addressed             This Visit's Progress    Patient Stated       02/05/2024, denies goals       Visit info / Clinical Intake: Medicare Wellness Visit Type:: Subsequent Annual Wellness Visit Persons participating in visit:: patient Medicare Wellness Visit Mode:: Telephone If telephone:: video declined Because this visit was a virtual/telehealth visit:: unable to obtan vitals due to lack of equipment If Telephone or Video please confirm:: I connected with the patient using audio  enabled telemedicine application and verified that I am speaking with the correct person using two identifiers; I discussed the limitations of evaluation and management by telemedicine; The patient expressed understanding and agreed to proceed Patient Location:: home Provider Location:: office Information given by:: patient Interpreter Needed?: No Pre-visit prep was completed: yes AWV questionnaire completed by patient prior to visit?: no Living arrangements:: lives with spouse/significant other Patient's Overall Health Status Rating: good Typical amount of pain: some Does pain affect daily life?: no Are you currently prescribed opioids?: no  Dietary Habits and Nutritional Risks How many meals a day?: 3 Eats fruit and vegetables daily?: (!) no Most  meals are obtained by: eating out In the last 2 weeks, have you had any of the following?: none Diabetic:: (!) yes Any non-healing wounds?: no How often do you check your BS?: 0 Would you like to be referred to a Nutritionist or for Diabetic Management? : no  Functional Status Activities of Daily Living (to include ambulation/medication): Independent Ambulation: Independent Medication Administration: Independent Home Management: Independent Manage your own finances?: yes Primary transportation is: driving Concerns about vision?: no *vision screening is required for WTM* Concerns about hearing?: (!) yes (has an ear appointment on the 14th) Uses hearing aids?: no  Fall Screening Falls in the past year?: 0 Number of falls in past year: 0 Was there an injury with Fall?: 0 Fall Risk Category Calculator: 0 Patient Fall Risk Level: Low Fall Risk  Fall Risk Patient at Risk for Falls Due to: Medication side effect Fall risk Follow up: Falls prevention discussed; Falls evaluation completed  Home and Transportation Safety: All rugs have non-skid backing?: N/A, no rugs All stairs or steps have railings?: N/A, no stairs Grab bars in the bathtub or shower?: yes Have non-skid surface in bathtub or shower?: yes Good home lighting?: yes Regular seat belt use?: yes Hospital stays in the last year:: no  Cognitive Assessment Difficulty concentrating, remembering, or making decisions? : no Will 6CIT or Mini Cog be Completed: yes What year is it?: 0 points What month is it?: 0 points Give patient an address phrase to remember (5 components): [address] About what time is it?: 0 points Count backwards from 20 to 1: 0 points Say the months of the year in reverse: 4 points (did not try) Repeat the address phrase from earlier: 6 points 6 CIT Score: 10 points  Advance Directives (For Healthcare) Does Patient Have a Medical Advance Directive?: Yes Type of Advance Directive: Living  will Would patient like information on creating a medical advance directive?: No - Patient declined  Reviewed/Updated  Reviewed/Updated: Reviewed All (Medical, Surgical, Family, Medications, Allergies, Care Teams, Patient Goals)        Objective:    Today's Vitals   There is no height or weight on file to calculate BMI.  Current Medications (verified) Outpatient Encounter Medications as of 02/05/2024  Medication Sig   acetaminophen  (TYLENOL ) 500 MG tablet Take 1,000 mg by mouth at bedtime.   albuterol  (VENTOLIN  HFA) 108 (90 Base) MCG/ACT inhaler Inhale 2 puffs into the lungs every 6 (six) hours as needed for wheezing or shortness of breath.   Blood Glucose Monitoring Suppl (ONE TOUCH ULTRA 2) w/Device KIT Use as directed to check blood sugars three times daily E11.69   cetirizine  (ZYRTEC  ALLERGY) 10 MG tablet Take one tablet nightly for allergies.   cyanocobalamin  (VITAMIN B12) 1000 MCG tablet Take 1 tablet (1,000 mcg total) by mouth daily.   diltiazem  (DILACOR  XR) 240 MG 24 hr capsule Take 1 capsule (240 mg total) by mouth daily FOR BLOOD PRESSURE AND HEART RHYTHM   empagliflozin  (JARDIANCE ) 10 MG TABS tablet Take 1 tablet (10 mg total) by mouth daily before breakfast.   Fluticasone -Umeclidin-Vilant (TRELEGY ELLIPTA ) 200-62.5-25 MCG/ACT AEPB Inhale 1 puff into the lungs daily for COPD /Asthma   glucose blood (ONETOUCH ULTRA) test strip Use as instructed to check blood sugars 3 times daily E11.69   glucose blood (TRUE METRIX BLOOD GLUCOSE TEST) test strip Use as instructed to check blood sugars   Lancets (ONETOUCH ULTRASOFT) lancets Use as instructed to check blood sugars three times daily E11.69   magnesium  gluconate (MAGONATE) 500 MG tablet Take 500 mg by mouth daily.    montelukast  (SINGULAIR ) 10 MG tablet Take 1 tablet (10 mg total) by mouth daily for allergies.   mupirocin  ointment (BACTROBAN ) 2 % Apply 1 Application topically 2 (two) times daily.   rosuvastatin  (CRESTOR ) 40 MG  tablet Take 1 tablet (40 mg total) by mouth daily for cholesterol.   True Comfort Safety Lancets MISC Use as directed to check blood sugars E11.65   VITAMIN D  PO Take 5,000 Units by mouth daily.   Vonoprazan Fumarate  (VOQUEZNA ) 10 MG TABS Take 1 tablet by mouth daily.   [Paused] alfuzosin  (UROXATRAL ) 10 MG 24 hr tablet Take 1 tablet (10 mg total) by mouth daily with breakfast. (Patient not taking: Reported on 02/05/2024)   amoxicillin -clavulanate (AUGMENTIN ) 875-125 MG tablet Take 1 tablet by mouth every 12 (twelve) hours. (Patient not taking: Reported on 02/05/2024)   [DISCONTINUED] glipiZIDE  (GLUCOTROL ) 5 MG tablet Take 0.5-1 tablets (2.5-5 mg total) by mouth 2 (two) times daily with a meal for diabetes.   No facility-administered encounter medications on file as of 02/05/2024.   Hearing/Vision screen Hearing Screening - Comments:: Has a hearing appointment soon Vision Screening - Comments:: Regular eye exams,  Immunizations and Health Maintenance Health Maintenance  Topic Date Due   Zoster Vaccines- Shingrix (1 of 2) 02/03/1964   COVID-19 Vaccine (3 - Pfizer risk series) 07/06/2019   OPHTHALMOLOGY EXAM  05/17/2023   FOOT EXAM  01/11/2024   HEMOGLOBIN A1C  03/27/2024   Diabetic kidney evaluation - Urine ACR  06/11/2024   Diabetic kidney evaluation - eGFR measurement  10/26/2024   Lung Cancer Screening  11/19/2024   Medicare Annual Wellness (AWV)  02/04/2025   DTaP/Tdap/Td (4 - Td or Tdap) 11/18/2033   Pneumococcal Vaccine: 50+ Years  Completed   Influenza Vaccine  Completed   Hepatitis C Screening  Completed   Meningococcal B Vaccine  Aged Out   Fecal DNA (Cologuard)  Discontinued        Assessment/Plan:  This is a routine wellness examination for Latrell.  Patient Care Team: [name], NP as PCP - General (Family Medicine) Lavona Agent, MD as PCP - Cardiology (Cardiology)  I have personally reviewed and noted the following in the patient's chart:   Medical and  social history Use of alcohol, tobacco or illicit drugs  Current medications and supplements including opioid prescriptions. Functional ability and status Nutritional status Physical activity Advanced directives List of other physicians Hospitalizations, surgeries, and ER visits in previous 12 months Vitals Screenings to include cognitive, depression, and falls Referrals and appointments  No orders of the defined types were placed in this encounter.  In addition, I have reviewed and discussed with patient certain preventive protocols, quality metrics, and best practice recommendations. A written personalized care plan for preventive services as well  as general preventive health recommendations were provided to patient.   Ardella FORBES Dawn, LPN   [id number]   Return in 1 year (on 02/04/2025).  After Visit Summary: (Pick Up) Due to this being a telephonic visit, with patients personalized plan was offered to patient and patient has requested to Pick up at office.  Nurse Notes: Patient is due for covid and shingles vaccine. Foot exam will be addressed at appointment on 02/19/2024

## 2024-02-07 ENCOUNTER — Ambulatory Visit (INDEPENDENT_AMBULATORY_CARE_PROVIDER_SITE_OTHER): Admitting: Physician Assistant

## 2024-02-07 ENCOUNTER — Encounter (INDEPENDENT_AMBULATORY_CARE_PROVIDER_SITE_OTHER): Payer: Self-pay | Admitting: Physician Assistant

## 2024-02-07 VITALS — BP 125/68 | HR 67 | Temp 98.3°F | Ht 70.0 in | Wt 158.0 lb

## 2024-02-07 DIAGNOSIS — H9193 Unspecified hearing loss, bilateral: Secondary | ICD-10-CM | POA: Diagnosis not present

## 2024-02-10 NOTE — Progress Notes (Signed)
 Dear Dr. Petrina, Here is my assessment for our mutual patient, Jesus Bush. Thank you for allowing me the opportunity to care for your patient. Please do not hesitate to contact me should you have any other questions. Sincerely, Chyrl Cohen PA-C  Otolaryngology Clinic Note Referring provider: Dr. Petrina HPI:  Jesus Bush is a 78 y.o. male kindly referred by Dr. Petrina   Discussed the use of AI scribe software for clinical note transcription with the patient, who gave verbal consent to proceed.  History of Present Illness    Jesus Bush is a 78 year old male who presents with hearing loss. He was referred for evaluation of hearing loss.  He has experienced hearing loss for approximately five to six years. His wife has noted his difficulty in hearing, although he believes his hearing is adequate. There is no noticeable difference in hearing between his ears.  No ear pain, dizziness, or history of ear surgery. He has not encountered significant issues in social situations due to his hearing loss.  He recalls failing a basic hearing screening in the office but questions the accuracy of the test.          Independent Review of Additional Tests or Records:  None   PMH/Meds/All/SocHx/FamHx/ROS:   Past Medical History:  Diagnosis Date   COPD (chronic obstructive pulmonary disease) (HCC)    Diabetes mellitus without complication (HCC)    Hyperlipidemia    Hypertension    Vitamin D  deficiency      Past Surgical History:  Procedure Laterality Date   CATARACT EXTRACTION, BILATERAL Bilateral 2018   Dr. Rosealee   INTERCOSTAL NERVE BLOCK  08/27/2022   Procedure: INTERCOSTAL NERVE BLOCK;  Surgeon: Shyrl Linnie KIDD, MD;  Location: [facility] OR;  Service: Thoracic;;    Family History  Problem Relation Age of Onset   Stroke Mother    Heart disease Father    Heart attack Father    Diabetes Sister    Heart disease Sister    Heart attack Brother    Hypertension Brother     Colon cancer Neg Hx    Stomach cancer Neg Hx    Esophageal cancer Neg Hx      Social Connections: Moderately Isolated (02/05/2024)   Social Connection and Isolation Panel    Frequency of Communication with Friends and Family: Three times a week    Frequency of Social Gatherings with Friends and Family: Not on file    Attends Religious Services: Never    Active Member of Clubs or Organizations: No    Attends Banker Meetings: Never    Marital Status: Married      Current Outpatient Medications:    acetaminophen  (TYLENOL ) 500 MG tablet, Take 1,000 mg by mouth at bedtime., Disp: , Rfl:    albuterol  (VENTOLIN  HFA) 108 (90 Base) MCG/ACT inhaler, Inhale 2 puffs into the lungs every 6 (six) hours as needed for wheezing or shortness of breath., Disp: 8 g, Rfl: 2   [Paused] alfuzosin  (UROXATRAL ) 10 MG 24 hr tablet, Take 1 tablet (10 mg total) by mouth daily with breakfast. (Patient not taking: Reported on 02/05/2024), Disp: 30 tablet, Rfl: 11   amoxicillin -clavulanate (AUGMENTIN ) 875-125 MG tablet, Take 1 tablet by mouth every 12 (twelve) hours. (Patient not taking: Reported on 02/05/2024), Disp: 14 tablet, Rfl: 0   Blood Glucose Monitoring Suppl (ONE TOUCH ULTRA 2) w/Device KIT, Use as directed to check blood sugars three times daily E11.69, Disp: 1 kit, Rfl: 0  cetirizine  (ZYRTEC  ALLERGY) 10 MG tablet, Take one tablet nightly for allergies., Disp: 90 tablet, Rfl: 1   cyanocobalamin  (VITAMIN B12) 1000 MCG tablet, Take 1 tablet (1,000 mcg total) by mouth daily., Disp: , Rfl:    diltiazem  (DILACOR XR ) 240 MG 24 hr capsule, Take 1 capsule (240 mg total) by mouth daily FOR BLOOD PRESSURE AND HEART RHYTHM, Disp: 90 capsule, Rfl: 3   empagliflozin  (JARDIANCE ) 10 MG TABS tablet, Take 1 tablet (10 mg total) by mouth daily before breakfast., Disp: 30 tablet, Rfl: 6   Fluticasone -Umeclidin-Vilant (TRELEGY ELLIPTA ) 200-62.5-25 MCG/ACT AEPB, Inhale 1 puff into the lungs daily for COPD /Asthma,  Disp: 180 each, Rfl: 3   glucose blood (ONETOUCH ULTRA) test strip, Use as instructed to check blood sugars 3 times daily E11.69, Disp: 100 each, Rfl: 12   glucose blood (TRUE METRIX BLOOD GLUCOSE TEST) test strip, Use as instructed to check blood sugars, Disp: 100 each, Rfl: 12   Lancets (ONETOUCH ULTRASOFT) lancets, Use as instructed to check blood sugars three times daily E11.69, Disp: 100 each, Rfl: 12   magnesium  gluconate (MAGONATE) 500 MG tablet, Take 500 mg by mouth daily. , Disp: , Rfl:    montelukast  (SINGULAIR ) 10 MG tablet, Take 1 tablet (10 mg total) by mouth daily for allergies., Disp: 90 tablet, Rfl: 3   mupirocin  ointment (BACTROBAN ) 2 %, Apply 1 Application topically 2 (two) times daily., Disp: 22 g, Rfl: 0   rosuvastatin  (CRESTOR ) 40 MG tablet, Take 1 tablet (40 mg total) by mouth daily for cholesterol., Disp: 90 tablet, Rfl: 3   True Comfort Safety Lancets MISC, Use as directed to check blood sugars E11.65, Disp: 100 each, Rfl: 2   VITAMIN D  PO, Take 5,000 Units by mouth daily., Disp: , Rfl:    Vonoprazan Fumarate  (VOQUEZNA ) 10 MG TABS, Take 1 tablet by mouth daily., Disp: 90 tablet, Rfl: 2   Physical Exam:   BP 125/68   Pulse 67   Temp 98.3 F (36.8 C)   Ht 5' 10 (1.778 m)   Wt 158 lb (71.7 kg)   SpO2 93%   BMI 22.67 kg/m   Pertinent Findings  CN II-XII grossly intact Bilateral EAC clear and TM intact with well pneumatized middle ear spaces Anterior rhinoscopy: Septum right deviation; bilateral inferior turbinates with no hypertrophy No lesions of oral cavity/oropharynx; dentition within normal limits No obviously palpable neck masses/lymphadenopathy/thyromegaly No respiratory distress or stridor       Seprately Identifiable Procedures:  None  Impression & Plans:  Jesus Bush is a 78 y.o. male with the following   Assessment and Plan    Hearing loss Chronic bilateral hearing loss   - Ordered hearing test to assess severity and symmetry. - Referred  to audiologist for test scheduling. - Plan follow-up if test shows significant abnormalities or asymmetry. - Discussed potential benefit of hearing aids if severe loss confirmed.          - f/u Phone call after audio   Thank you for allowing me the opportunity to care for your patient. Please do not hesitate to contact me should you have any other questions.  Sincerely, Chyrl Cohen PA-C [facility] Phone: [phone] Fax: [phone]  02/10/2024, 10:53 AM

## 2024-02-19 ENCOUNTER — Ambulatory Visit: Payer: Self-pay | Admitting: Family Medicine

## 2024-02-24 ENCOUNTER — Encounter: Payer: Medicare HMO | Admitting: Internal Medicine

## 2024-02-28 ENCOUNTER — Ambulatory Visit (INDEPENDENT_AMBULATORY_CARE_PROVIDER_SITE_OTHER): Admitting: Audiology

## 2024-02-28 DIAGNOSIS — H903 Sensorineural hearing loss, bilateral: Secondary | ICD-10-CM | POA: Diagnosis not present

## 2024-02-28 NOTE — Progress Notes (Signed)
  [address] [place], [place] [address] [phone]  Audiological Evaluation    Name: Jesus Bush     DOB:   [birth year]      MRN:   [id number]                                                                                     Service Date: 02/28/2024     Accompanied by: wife   Patient comes today after Jesus Cohen, PA-C sent a referral for a hearing evaluation due to concerns with hearing loss.   Symptoms Yes Details  Hearing loss  [x]  Others say cannot hear, he says that cannot hear as well as he used to  Tinnitus  []    Ear pain/ infections/pressure  []    Balance problems  []    Noise exposure history  [x]  Machinery without protection  Previous ear surgeries  []    Family history of hearing loss  []    Amplification  []    Other  []      Otoscopy: Right ear: Clear external ear canal and notable landmarks visualized on the tympanic membrane. Left ear:  Clear external ear canal and notable landmarks visualized on the tympanic membrane.  Tympanometry: Right ear: Type A - Normal external ear canal volume with normal middle ear pressure and normal tympanic membrane compliance. Findings are consistent with normal middle ear function. Left ear: Type A - Normal external ear canal volume with normal middle ear pressure and normal tympanic membrane compliance. Findings are consistent with normal middle ear function.    Hearing Evaluation The hearing test results were completed under headphones and results are deemed to be of good reliability. Test technique:  conventional    Pure tone Audiometry: Right ear- Borderline normal to moderately severe  sensorineural hearing loss from 250 Hz - 800 Hz. Left ear-  Borderline normal to severe sensorineural hearing loss from 250 Hz - 8000 Hz.  Speech Audiometry: Right ear- Speech Reception Threshold (SRT) was obtained at 30 dBHL. Left ear-Speech Reception Threshold (SRT) was obtained at 25 dBHL.   Word Recognition  Score Tested using NU-6 (recorded) Right ear: 92% was obtained at a presentation level of 85 dBHL with contralateral masking which is deemed as  excellent. Left ear: 88% was obtained at a presentation level of 85 dBHL with contralateral masking which is deemed as  good .   Impression: There is not a significant difference in pure-tone thresholds between ears. There is not a significant difference in the word recognition score in between ears.    Recommendations: Follow up with ENT as scheduled. Return for a hearing evaluation if concerns with hearing changes arise or per MD recommendation. Hearing protection use when exposed to loud/damaging sounds.  Consider a communication needs assessment for amplification after medical clearance is obtained, if needed.   Jesus Bush, AUD

## 2024-03-07 ENCOUNTER — Other Ambulatory Visit (HOSPITAL_COMMUNITY): Payer: Self-pay

## 2024-03-07 ENCOUNTER — Other Ambulatory Visit: Payer: Self-pay | Admitting: Family Medicine

## 2024-03-07 DIAGNOSIS — E1122 Type 2 diabetes mellitus with diabetic chronic kidney disease: Secondary | ICD-10-CM

## 2024-03-09 ENCOUNTER — Other Ambulatory Visit (HOSPITAL_COMMUNITY): Payer: Self-pay

## 2024-03-09 ENCOUNTER — Telehealth: Payer: Self-pay | Admitting: Family Medicine

## 2024-03-09 DIAGNOSIS — E1122 Type 2 diabetes mellitus with diabetic chronic kidney disease: Secondary | ICD-10-CM

## 2024-03-09 MED ORDER — EMPAGLIFLOZIN 10 MG PO TABS
10.0000 mg | ORAL_TABLET | Freq: Every day | ORAL | 6 refills | Status: DC
  Filled 2024-03-09: qty 30, 30d supply, fill #0

## 2024-03-09 NOTE — Telephone Encounter (Signed)
 Copied from CRM #[id number]. Topic: Clinical - Medication Refill >> Mar 09, 2024  3:12 PM Delon HERO wrote: Medication: Rx #: [id number] empagliflozin  (JARDIANCE ) 10 MG TABS tablet [[id number]]     Has the patient contacted their pharmacy? Yes (Agent: If no, request that the patient contact the pharmacy for the refill. If patient does not wish to contact the pharmacy document the reason why and proceed with request.) (Agent: If yes, when and what did the pharmacy advise?)  This is the patient's preferred pharmacy:  [facility] [address] [place] [address] Phone: [phone] Fax: [phone]   Is this the correct pharmacy for this prescription? Yes If no, delete pharmacy and type the correct one.   Has the prescription been filled recently? Yes  Is the patient out of the medication? Yes  Has the patient been seen for an appointment in the last year OR does the patient have an upcoming appointment? Yes  Can we respond through MyChart? Yes  Agent: Please be advised that Rx refills may take up to 3 business days. We ask that you follow-up with your pharmacy.

## 2024-03-10 ENCOUNTER — Other Ambulatory Visit (HOSPITAL_COMMUNITY): Payer: Self-pay

## 2024-03-16 ENCOUNTER — Other Ambulatory Visit (HOSPITAL_COMMUNITY): Payer: Self-pay

## 2024-03-16 ENCOUNTER — Ambulatory Visit: Payer: Self-pay | Admitting: Family Medicine

## 2024-03-16 ENCOUNTER — Encounter: Payer: Self-pay | Admitting: Audiology

## 2024-03-16 MED ORDER — EMPAGLIFLOZIN 10 MG PO TABS
10.0000 mg | ORAL_TABLET | Freq: Every day | ORAL | 6 refills | Status: AC
  Filled 2024-03-16 – 2024-04-15 (×2): qty 30, 30d supply, fill #0

## 2024-03-16 NOTE — Telephone Encounter (Signed)
 FYI Only or Action Required?: Action required by provider: update on patient condition and requesting Benzonatate  for cough.  Patient was last seen in primary care on 01/02/2024 by [name], NP.  Called Nurse Triage reporting Cough.  Symptoms began several weeks ago.  Interventions attempted: Rest, hydration, or home remedies.  Symptoms are: gradually worsening.  Triage Disposition: See Physician Within 24 Hours  Patient/caregiver understands and will follow disposition?: No, wishes to speak with PCP  Message from [facility] sent at 03/16/2024  9:46 AM EST  Reason for Triage: Dry cough. Would like to know if pcp will prescribe medication. Benzonatate .   Reason for Disposition  [1] Continuous (nonstop) coughing interferes with work or school AND [2] no improvement using cough treatment per Care Advice  Answer Assessment - Initial Assessment Questions Dry hacky cough for 3-4 weeks. Had URI prior to this with productive cough.  Denies SOB, CP, Fever, dizziness. Coughing worse at night.   Asking if Benzonatate  can be called into the pharmacy to help manage his coughing. Has been prescribed by previous provider in the past and very effective. Pharmacy confirmed.   1. ONSET: When did the cough begin?      3-4 weeks  2. SEVERITY: How bad is the cough today?      Worse at night  3. SPUTUM: Describe the color of your sputum (e.g., none, dry cough; clear, white, yellow, green)     Previously hacking up phlegm but not anymore  4. HEMOPTYSIS: Are you coughing up any blood? If Yes, ask: How much? (e.g., flecks, streaks, tablespoons, etc.)     denies 5. DIFFICULTY BREATHING: Are you having difficulty breathing? If Yes, ask: How bad is it? (e.g., mild, moderate, severe)      Denies  6. FEVER: Do you have a fever? If Yes, ask: What is your temperature, how was it measured, and when did it start?     Denies  7. CARDIAC HISTORY: Do you have any history of heart disease?  (e.g., heart attack, congestive heart failure)      Afib  8. LUNG HISTORY: Do you have any history of lung disease?  (e.g., pulmonary embolus, asthma, emphysema)     COPD 9. PE RISK FACTORS: Do you have a history of blood clots? (or: recent major surgery, recent prolonged travel, bedridden)     Denies  10. OTHER SYMPTOMS: Do you have any other symptoms? (e.g., runny nose, wheezing, chest pain)       Denies other symptoms  Protocols used: Cough - Acute Non-Productive-A-AH

## 2024-03-17 ENCOUNTER — Other Ambulatory Visit (HOSPITAL_COMMUNITY): Payer: Self-pay

## 2024-04-15 ENCOUNTER — Other Ambulatory Visit (HOSPITAL_COMMUNITY): Payer: Self-pay

## 2024-04-17 ENCOUNTER — Other Ambulatory Visit (HOSPITAL_COMMUNITY): Payer: Self-pay

## 2024-04-29 ENCOUNTER — Other Ambulatory Visit: Payer: Self-pay

## 2024-04-29 ENCOUNTER — Emergency Department (HOSPITAL_COMMUNITY)
Admission: EM | Admit: 2024-04-29 | Discharge: 2024-04-29 | Discharge status: Against Medical Advice | Source: Home / Self Care

## 2024-04-29 NOTE — ED Triage Notes (Signed)
 Pt bib ems from home with reports of emesis and HR jumping up to 140 per neighbor. VSS with ems. Occasional PVC's.

## 2024-04-29 NOTE — ED Notes (Signed)
 No response for vitals x5.

## 2024-04-30 ENCOUNTER — Telehealth: Payer: Self-pay | Admitting: Cardiology

## 2024-04-30 ENCOUNTER — Ambulatory Visit: Admitting: Cardiology

## 2024-04-30 ENCOUNTER — Ambulatory Visit

## 2024-04-30 VITALS — BP 120/58 | HR 90 | Ht 70.0 in | Wt 153.2 lb

## 2024-04-30 DIAGNOSIS — Z720 Tobacco use: Secondary | ICD-10-CM

## 2024-04-30 DIAGNOSIS — I10 Essential (primary) hypertension: Secondary | ICD-10-CM

## 2024-04-30 DIAGNOSIS — I2510 Atherosclerotic heart disease of native coronary artery without angina pectoris: Secondary | ICD-10-CM

## 2024-04-30 DIAGNOSIS — E785 Hyperlipidemia, unspecified: Secondary | ICD-10-CM | POA: Diagnosis not present

## 2024-04-30 DIAGNOSIS — I4729 Other ventricular tachycardia: Secondary | ICD-10-CM | POA: Diagnosis not present

## 2024-04-30 DIAGNOSIS — I480 Paroxysmal atrial fibrillation: Secondary | ICD-10-CM | POA: Diagnosis not present

## 2024-04-30 NOTE — Patient Instructions (Signed)
 Medication Instructions:  None  *If you need a refill on your cardiac medications before your next appointment, please call your pharmacy*  Lab Work: Today: Magnesium , Lipid Panel, CMET. If you have labs (blood work) drawn today and your tests are completely normal, you will receive your results only by: MyChart Message (if you have MyChart) OR A paper copy in the mail If you have any lab test that is abnormal or we need to change your treatment, we will call you to review the results.  Testing/Procedures: None   Follow-Up: At [facility], you and your health needs are our priority.  As part of our continuing mission to provide you with exceptional heart care, our providers are all part of one team.  This team includes your primary Cardiologist (physician) and Advanced Practice Providers or APPs (Physician Assistants and Nurse Practitioners) who all work together to provide you with the care you need, when you need it.  Your next appointment:   1 month(s)  Provider:   Lynwood Schilling, MD     Other Instructions None

## 2024-04-30 NOTE — Telephone Encounter (Signed)
 Patient c/o Palpitations: STAT if patient c/o lightheadedness, shortness of breath, or chest pain  How long have you had palpitations/irregular HR/ Afib? Are you having the symptoms now?  Daughter says yesterday HR was elevated. Went from 60 to 120-130. He had nausea, vomiting, and diarrhea. They sent him to the hospital because he was tachycardic. Aside from N/V/D he was asymptomatic. No palpitations. Couldn't feel that his HR was elevated at all. They didn't do anything in hospital. Not even an EKG, per daughter. Patient doesn't have nausea or vomiting today but having PVC's + HR is 100-110.  Are you currently experiencing lightheadedness, SOB or CP?  No   Do you have a history of afib (atrial fibrillation) or irregular heart rhythm?  Yes   Have you checked your BP or HR? (document readings if available):  2/04: 130/80   Are you experiencing any other symptoms?  No

## 2024-04-30 NOTE — Progress Notes (Signed)
"   °  °  Cardiology Office Note Date:  04/30/2024  ID:  [name], [name] 1945/04/03, MRN [id number] PCP:  [name], NP  Cardiologist:  Joelle VEAR Ren Donley, MD  Chief Complaint  Patient presents with   PVCs     Problems CAC on CT COPD DM Left-sided pneumothorax s/p pleurectomy and LUE wedge resection 7/24 Afib in setting of hospital admission; holding off Eliquis  given job TTE 8/25: 65-70% EM 10/25: 1 episode of NSVT w/ 7 beats Former tobacco use 44-PY M: DE240, EN10, RN40,   Visits  8/25: EM given Hx of Afib and NSVT 02/26: CM/Mag    Discussed the use of AI scribe software for clinical note transcription with the patient, who gave verbal consent to proceed.  History of Present Illness Jesus Bush is a 79 year old male who presents with irregular heart rate and recent episodes of vomiting and diarrhea.  He began experiencing vomiting on Wednesday night, which progressed to both vomiting and diarrhea by Thursday. His heart rate increased to 140 bpm on Thursday afternoon, prompting a visit to the emergency room, although he did not see a doctor during this visit. His heart rate has been fluctuating between 60 to 130 bpm, and later between 40 to 120 bpm, with irregularities noted overnight, reaching 130 bpm.  Emergency medical services were called, and first responders suggested hospital admission, but he was taken home after the ambulance arrived. His heart rate continued to fluctuate. His daughter-in-law noted that he was experiencing trigeminal PVCs, which she identified without an EKG. He reports feeling better today, with no vomiting or diarrhea since yesterday. He vomited after drinking water yesterday, which contained 'orange looking stuff,' but has since felt improved. He ate cereal and drank coffee this morning without issues.  No current symptoms of diarrhea or pain. He did not sleep much last night but feels good today. His blood pressure has not been low recently,  although he was hospitalized in August for low blood pressure and high heart rate. No palpitations.   ROS: Please see the history of present illness. All other systems are reviewed and negative.   PHYSICAL EXAM: VS:  BP (!) 120/58 (BP Location: Left Arm, Patient Position: Sitting, Cuff Size: Normal)   Pulse 90   Ht 5' 10 (1.778 m)   Wt 153 lb 3.2 oz (69.5 kg)   SpO2 95%   BMI 21.98 kg/m  , BMI Body mass index is 21.98 kg/m. GEN: Well nourished, well developed, in no acute distress HEENT: normal Neck: no JVD, carotid bruits, or masses Cardiac: irregular; no murmurs, rubs, or gallops,no edema  Respiratory:  CTAB bilaterally, normal work of breathing GI: soft, nontender, nondistended, + BS Extremities: No LE edema Skin: warm and dry, no rash Neuro:  Strength and sensation are intact  EKG: Trigeminy  Recent Labs: Reviewed  Studies: Reviewed Assessment & Plan  Ventricular premature contractions (PVCs) PVCs likely secondary to electrolyte disturbance from recent vomiting and diarrhea. Heart rate fluctuates between 60 to 130 bpm with irregular rhythm. Likely benign due to absence of symptoms and recent electrolyte disturbance. GI symptoms seem to have resolved. - Ordered blood work to check potassium and magnesium  levels. - Will prescribe electrolyte supplements if levels are low. - Advised repletion of electrolytes with drinks like Gatorade. - Plan for follow-up EKG after resolution of PVCs to confirm resolution.   Signed, Joelle VEAR Ren Donley, MD  04/30/2024 1:36 PM    [facility] "

## 2024-04-30 NOTE — Telephone Encounter (Signed)
 Patient daughter is calling to check status of concerns

## 2024-04-30 NOTE — Telephone Encounter (Signed)
 Called and spoke to daughter in law Donyel Nester) who is a engineer, civil (consulting). On 04/29/24, pt was vomiting, had diarrhea, and heart was racing. Devere went to assess him (she lives next door) and manually checked HR, it was 136. SpO2=92%. EMS was called. Pt taken to ED but Devere does not know if a provider saw the pt. No EKG was done per Devere and he was only in the ED for 25 min.    Today pt feels better but is weak. Slightly nauseated. No palpitations today. BP=120/92, wheezing in lungs (pt has COPD), HR 40-120, average 100 (manually checked). Devere wonders if he is having Ventricular Trigeminy as she feels every third pulse stronger.   Pt was scheduled to see DOD (Azobou) on 04/30/24 at 1:20 pm. Dr. Ren notified and was okay to see pt.   Pt scheduled to go on a cruise on 05/01/24.

## 2024-05-01 ENCOUNTER — Ambulatory Visit: Payer: Self-pay

## 2024-05-01 LAB — COMPREHENSIVE METABOLIC PANEL WITH GFR
ALT: 15 [IU]/L (ref 0–44)
AST: 19 [IU]/L (ref 0–40)
Albumin: 4.4 g/dL (ref 3.8–4.8)
Alkaline Phosphatase: 96 [IU]/L (ref 47–123)
BUN/Creatinine Ratio: 18 (ref 10–24)
BUN: 20 mg/dL (ref 8–27)
Bilirubin Total: 0.4 mg/dL (ref 0.0–1.2)
CO2: 22 mmol/L (ref 20–29)
Calcium: 10.1 mg/dL (ref 8.6–10.2)
Chloride: 97 mmol/L (ref 96–106)
Creatinine, Ser: 1.13 mg/dL (ref 0.76–1.27)
Globulin, Total: 3 g/dL (ref 1.5–4.5)
Glucose: 131 mg/dL — ABNORMAL HIGH (ref 70–99)
Potassium: 4.3 mmol/L (ref 3.5–5.2)
Sodium: 134 mmol/L (ref 134–144)
Total Protein: 7.4 g/dL (ref 6.0–8.5)
eGFR: 66 mL/min/{1.73_m2}

## 2024-05-01 LAB — LIPID PANEL
Chol/HDL Ratio: 5.3 ratio — ABNORMAL HIGH (ref 0.0–5.0)
Cholesterol, Total: 171 mg/dL (ref 100–199)
HDL: 32 mg/dL — ABNORMAL LOW
LDL Chol Calc (NIH): 102 mg/dL — ABNORMAL HIGH (ref 0–99)
Triglycerides: 214 mg/dL — ABNORMAL HIGH (ref 0–149)
VLDL Cholesterol Cal: 37 mg/dL (ref 5–40)

## 2024-05-01 LAB — MAGNESIUM: Magnesium: 1.9 mg/dL (ref 1.6–2.3)

## 2024-05-01 NOTE — Telephone Encounter (Signed)
 Pt seen in clinic by Dr. Azobou 2/5

## 2025-03-03 ENCOUNTER — Ambulatory Visit
